# Patient Record
Sex: Male | Born: 1960
Health system: Southern US, Community
[De-identification: ages and names within clinical notes are randomized; demographics above are authoritative.]

## PROBLEM LIST (undated history)

## (undated) DIAGNOSIS — E119 Type 2 diabetes mellitus without complications: Secondary | ICD-10-CM

## (undated) DIAGNOSIS — G629 Polyneuropathy, unspecified: Secondary | ICD-10-CM

## (undated) DIAGNOSIS — I10 Essential (primary) hypertension: Secondary | ICD-10-CM

## (undated) DIAGNOSIS — F1020 Alcohol dependence, uncomplicated: Secondary | ICD-10-CM

## (undated) DIAGNOSIS — K861 Other chronic pancreatitis: Secondary | ICD-10-CM

## (undated) DIAGNOSIS — K862 Cyst of pancreas: Secondary | ICD-10-CM

## (undated) DIAGNOSIS — E785 Hyperlipidemia, unspecified: Secondary | ICD-10-CM

## (undated) DIAGNOSIS — C801 Malignant (primary) neoplasm, unspecified: Secondary | ICD-10-CM

## (undated) HISTORY — DX: Essential (primary) hypertension: I10

## (undated) HISTORY — PX: OTHER SURGICAL HISTORY: SHX169

## (undated) HISTORY — DX: Hyperlipidemia, unspecified: E78.5

## (undated) HISTORY — DX: Malignant (primary) neoplasm, unspecified: C80.1

## (undated) HISTORY — DX: Type 2 diabetes mellitus without complications: E11.9

---

## 2004-11-01 ENCOUNTER — Ambulatory Visit: Payer: Self-pay | Admitting: Internal Medicine

## 2005-03-01 ENCOUNTER — Ambulatory Visit: Payer: Self-pay | Admitting: Internal Medicine

## 2005-08-31 ENCOUNTER — Ambulatory Visit: Payer: Self-pay | Admitting: Internal Medicine

## 2006-02-28 ENCOUNTER — Ambulatory Visit: Payer: Self-pay | Admitting: Internal Medicine

## 2006-08-27 ENCOUNTER — Ambulatory Visit: Payer: Self-pay | Admitting: Internal Medicine

## 2007-02-25 ENCOUNTER — Ambulatory Visit: Payer: Self-pay | Admitting: Internal Medicine

## 2007-02-25 LAB — CONVERTED CEMR LAB: Hgb A1c MFr Bld: 6.9 % — ABNORMAL HIGH (ref 4.6–6.0)

## 2007-08-19 DIAGNOSIS — I10 Essential (primary) hypertension: Secondary | ICD-10-CM | POA: Insufficient documentation

## 2007-08-20 ENCOUNTER — Ambulatory Visit: Payer: Self-pay | Admitting: Internal Medicine

## 2007-08-20 DIAGNOSIS — E785 Hyperlipidemia, unspecified: Secondary | ICD-10-CM | POA: Insufficient documentation

## 2007-08-20 DIAGNOSIS — E119 Type 2 diabetes mellitus without complications: Secondary | ICD-10-CM | POA: Insufficient documentation

## 2007-12-18 ENCOUNTER — Ambulatory Visit: Payer: Self-pay | Admitting: Internal Medicine

## 2007-12-18 LAB — CONVERTED CEMR LAB: Cholesterol, target level: 200 mg/dL

## 2007-12-19 ENCOUNTER — Telehealth: Payer: Self-pay | Admitting: Internal Medicine

## 2011-12-05 DIAGNOSIS — C439 Malignant melanoma of skin, unspecified: Secondary | ICD-10-CM

## 2011-12-05 HISTORY — DX: Malignant melanoma of skin, unspecified: C43.9

## 2012-12-10 ENCOUNTER — Telehealth: Payer: Self-pay | Admitting: Internal Medicine

## 2012-12-10 NOTE — Telephone Encounter (Signed)
Sister, Kenneth [name] calling back.  Kenneth [name] is receiving a chemo tx at this time and his mom Kenneth [name] is with him.  He has worsening HTN and needs an appointment for same with Dr. Amador Cunas "either Thursday or Monday".   He has "not been seen in the office in years".  Please call Kenneth [name] to schedule this appointment.

## 2012-12-10 NOTE — Telephone Encounter (Signed)
RN attempted to call wife back, left a voice mail.

## 2012-12-11 NOTE — Telephone Encounter (Signed)
Scheduled the appt but not confirmed left message on vm

## 2012-12-11 NOTE — Telephone Encounter (Signed)
appt schedule

## 2012-12-12 ENCOUNTER — Encounter: Payer: Self-pay | Admitting: Internal Medicine

## 2012-12-12 ENCOUNTER — Ambulatory Visit (INDEPENDENT_AMBULATORY_CARE_PROVIDER_SITE_OTHER): Payer: Self-pay | Admitting: Internal Medicine

## 2012-12-12 VITALS — BP 130/90 | HR 74 | Temp 98.1°F | Resp 18 | Ht 63.75 in | Wt 171.0 lb

## 2012-12-12 DIAGNOSIS — C7800 Secondary malignant neoplasm of unspecified lung: Secondary | ICD-10-CM | POA: Insufficient documentation

## 2012-12-12 DIAGNOSIS — Z0000 Encounter for general adult medical examination without abnormal findings: Secondary | ICD-10-CM

## 2012-12-12 DIAGNOSIS — C439 Malignant melanoma of skin, unspecified: Secondary | ICD-10-CM

## 2012-12-12 DIAGNOSIS — I10 Essential (primary) hypertension: Secondary | ICD-10-CM

## 2012-12-12 MED ORDER — HYDROCHLOROTHIAZIDE 25 MG PO TABS: 25.0000 mg | ORAL_TABLET | Freq: Every day | ORAL | Status: DC

## 2012-12-12 MED ORDER — ATENOLOL 100 MG PO TABS: 100.0000 mg | ORAL_TABLET | Freq: Every day | ORAL | Status: DC

## 2012-12-12 NOTE — Progress Notes (Signed)
Subjective:    Patient ID: Kenneth [name], male    DOB: 30-Mar-1961, 52 y.o.   MRN: [id number]  HPI  52 year old patient who is seen today to reestablish with our practice and for a preventive health examination. He was diagnosed with metastatic melanoma to lungs and brain in September of last year. He is followed closely at [facility] and has completed the chemotherapy. He is scheduled for a followup PET scan and will be considered for additional radiotherapy in the future. He presented with a large lesion involving his right anterior thigh that had been present for at least 2 years.  Diagnosis was delayed due to the lack of health insurance.  No past medical history on file.  History   Social History  . Marital Status: Single    Spouse Name: N/A    Number of Children: N/A  . Years of Education: N/A   Occupational History  . Not on file.   Social History Main Topics  . Smoking status: Never Smoker   . Smokeless tobacco: Never Used  . Alcohol Use: No  . Drug Use: No  . Sexually Active: Not on file   Other Topics Concern  . Not on file   Social History Narrative  . No narrative on file    No past surgical history on file.  No family history on file.  No Known Allergies  Current Outpatient Prescriptions on File Prior to Visit  Medication Sig Dispense Refill  . atenolol (TENORMIN) 50 MG tablet Take 50 mg by mouth daily.      . hydrochlorothiazide (HYDRODIURIL) 25 MG tablet Take 25 mg by mouth daily.        BP 130/90  Pulse 74  Temp 98.1 F (36.7 C) (Oral)  Resp 18  Ht 5' 3.75" (1.619 m)  Wt 171 lb (77.565 kg)  BMI 29.58 kg/m2  SpO2 97%   Father recently died due to complications of congestive heart failure    Review of Systems  Constitutional: Negative for fever, chills, activity change, appetite change and fatigue.  HENT: Negative for hearing loss, ear pain, congestion, rhinorrhea, sneezing, mouth sores, trouble swallowing, neck pain, neck stiffness,  dental problem, voice change, sinus pressure and tinnitus.   Eyes: Negative for photophobia, pain, redness and visual disturbance.  Respiratory: Negative for apnea, cough, choking, chest tightness, shortness of breath and wheezing.   Cardiovascular: Negative for chest pain, palpitations and leg swelling.  Gastrointestinal: Negative for nausea, vomiting, abdominal pain, diarrhea, constipation, blood in stool, abdominal distention, anal bleeding and rectal pain.  Genitourinary: Negative for dysuria, urgency, frequency, hematuria, flank pain, decreased urine volume, discharge, penile swelling, scrotal swelling, difficulty urinating, genital sores and testicular pain.  Musculoskeletal: Negative for myalgias, back pain, joint swelling, arthralgias and gait problem.  Skin: Positive for wound. Negative for color change and rash.  Neurological: Negative for dizziness, tremors, seizures, syncope, facial asymmetry, speech difficulty, weakness, light-headedness, numbness and headaches.  Hematological: Negative for adenopathy. Does not bruise/bleed easily.  Psychiatric/Behavioral: Negative for suicidal ideas, hallucinations, behavioral problems, confusion, sleep disturbance, self-injury, dysphoric mood, decreased concentration and agitation. The patient is not nervous/anxious.        Objective:   Physical Exam  Constitutional: He appears well-developed and well-nourished.       Blood pressure 150/90  HENT:  Head: Normocephalic and atraumatic.  Right Ear: External ear normal.  Left Ear: External ear normal.  Nose: Nose normal.  Mouth/Throat: Oropharynx is clear and moist.  Eyes: Conjunctivae normal and  EOM are normal. Pupils are equal, round, and reactive to light. No scleral icterus.  Neck: Normal range of motion. Neck supple. No JVD present. No thyromegaly present.  Cardiovascular: Regular rhythm, normal heart sounds and intact distal pulses.  Exam reveals no gallop and no friction rub.   No murmur  heard. Pulmonary/Chest: Effort normal and breath sounds normal. He exhibits no tenderness.  Abdominal: Soft. Bowel sounds are normal. He exhibits no distension and no mass. There is no tenderness.  Genitourinary: Prostate normal and penis normal. Guaiac negative stool.  Musculoskeletal: Normal range of motion. He exhibits no edema and no tenderness.  Lymphadenopathy:    He has no cervical adenopathy.  Neurological: He is alert. He has normal reflexes. No cranial nerve deficit. Coordination normal.  Skin: Skin is warm and dry. No rash noted.       A large fungating lesion at least 12 cm in greatest length involving his right anterior thigh  Psychiatric: He has a normal mood and affect. His behavior is normal.          Assessment & Plan:   Metastatic melanoma to lung  and brain Hypertension suboptimal control. We'll increase atenolol to 100 mg daily. Home blood pressure monitoring encouraged

## 2012-12-12 NOTE — Patient Instructions (Signed)
Limit your sodium (Salt) intake  Please check your blood pressure on a regular basis.  If it is consistently greater than 150/90, please make an office appointment.    It is important that you exercise regularly, at least 20 minutes 3 to 4 times per week.  If you develop chest pain or shortness of breath seek  medical attention.  Return in 6 months for follow-up  

## 2013-01-30 ENCOUNTER — Telehealth: Payer: Self-pay | Admitting: Internal Medicine

## 2013-01-30 NOTE — Telephone Encounter (Signed)
Patient requesting a call back from Dr. Kirtland Bouchard to discuss cancer treatment he is undergoing at [facility]. They [facility] physicians have asked him to call Dr. Kirtland Bouchard and f/u on the latest.

## 2013-02-03 ENCOUNTER — Ambulatory Visit: Payer: Self-pay | Admitting: Internal Medicine

## 2013-02-04 ENCOUNTER — Encounter: Payer: Self-pay | Admitting: Internal Medicine

## 2013-02-04 ENCOUNTER — Ambulatory Visit (INDEPENDENT_AMBULATORY_CARE_PROVIDER_SITE_OTHER): Payer: Self-pay | Admitting: Internal Medicine

## 2013-02-04 VITALS — BP 140/96 | HR 76 | Temp 98.3°F | Resp 18 | Wt 174.0 lb

## 2013-02-04 DIAGNOSIS — C7800 Secondary malignant neoplasm of unspecified lung: Secondary | ICD-10-CM

## 2013-02-04 DIAGNOSIS — C439 Malignant melanoma of skin, unspecified: Secondary | ICD-10-CM

## 2013-02-04 DIAGNOSIS — I10 Essential (primary) hypertension: Secondary | ICD-10-CM

## 2013-02-04 DIAGNOSIS — E119 Type 2 diabetes mellitus without complications: Secondary | ICD-10-CM

## 2013-02-04 MED ORDER — LISINOPRIL-HYDROCHLOROTHIAZIDE 20-25 MG PO TABS: 1.0000 | ORAL_TABLET | Freq: Every day | ORAL | Status: DC

## 2013-02-04 MED ORDER — METFORMIN HCL 1000 MG PO TABS: 1000.0000 mg | ORAL_TABLET | Freq: Two times a day (BID) | ORAL | Status: DC

## 2013-02-04 MED ORDER — GLIMEPIRIDE 2 MG PO TABS: 1.0000 mg | ORAL_TABLET | Freq: Every day | ORAL | Status: DC

## 2013-02-04 NOTE — Progress Notes (Signed)
Subjective:    Patient ID: Kenneth [name], male    DOB: 31-Mar-1961, 52 y.o.   MRN: [id number]    HPI  52 year old patient who has recently reestablished with our practice.  He is followed at [facility] for her metastatic melanoma to the brain and lungs. He's had a recent PET scan that apparently revealed resolution of the metastatic lesions. He is being considered for local excision of the primary melanoma involving his right leg. He has a history of hypertension and diabetes but has not lost to medical followup;  he has been on metformin in the past. He presently is on atenolol 100 mg daily and hydrochlorothiazide 25 mg for blood pressure control. He feels well today. He states blood sugars have been greater than 200 at [facility]. They have performed a recent hemoglobin A1c. Records have been requested  History reviewed. No pertinent past medical history.  History   Social History  . Marital Status: Single    Spouse Name: N/A    Number of Children: N/A  . Years of Education: N/A   Occupational History  . Not on file.   Social History Main Topics  . Smoking status: Never Smoker   . Smokeless tobacco: Never Used  . Alcohol Use: No  . Drug Use: No  . Sexually Active: Not on file   Other Topics Concern  . Not on file   Social History Narrative  . No narrative on file    History reviewed. No pertinent past surgical history.  No family history on file.  No Known Allergies  Current Outpatient Prescriptions on File Prior to Visit  Medication Sig Dispense Refill  . atenolol (TENORMIN) 100 MG tablet Take 1 tablet (100 mg total) by mouth daily.  90 tablet  3  . MULTIPLE VITAMIN PO Take 1 tablet by mouth daily.       No current facility-administered medications on file prior to visit.    BP 140/96  Pulse 76  Temp(Src) 98.3 F (36.8 C) (Oral)  Resp 18  Wt 174 lb (78.926 kg)  BMI 30.11 kg/m2  SpO2 97%       Review of Systems  Constitutional: Negative for fever,  chills, appetite change and fatigue.  HENT: Negative for hearing loss, ear pain, congestion, sore throat, trouble swallowing, neck stiffness, dental problem, voice change and tinnitus.   Eyes: Negative for pain, discharge and visual disturbance.  Respiratory: Negative for cough, chest tightness, wheezing and stridor.   Cardiovascular: Negative for chest pain, palpitations and leg swelling.  Gastrointestinal: Negative for nausea, vomiting, abdominal pain, diarrhea, constipation, blood in stool and abdominal distention.  Genitourinary: Negative for urgency, hematuria, flank pain, discharge, difficulty urinating and genital sores.  Musculoskeletal: Negative for myalgias, back pain, joint swelling, arthralgias and gait problem.  Skin: Positive for wound. Negative for rash.  Neurological: Negative for dizziness, syncope, speech difficulty, weakness, numbness and headaches.  Hematological: Negative for adenopathy. Does not bruise/bleed easily.  Psychiatric/Behavioral: Negative for behavioral problems and dysphoric mood. The patient is not nervous/anxious.        Objective:   Physical Exam  Constitutional: He is oriented to person, place, and time. He appears well-developed.  Blood pressure 150/92 Weight 174  HENT:  Head: Normocephalic.  Right Ear: External ear normal.  Left Ear: External ear normal.  Eyes: Conjunctivae and EOM are normal.  Neck: Normal range of motion.  Cardiovascular: Normal rate and normal heart sounds.   Pulmonary/Chest: Breath sounds normal.  Abdominal: Bowel sounds are  normal.  Musculoskeletal: Normal range of motion. He exhibits no edema and no tenderness.  Neurological: He is alert and oriented to person, place, and time.  Psychiatric: He has a normal mood and affect. His behavior is normal.          Assessment & Plan:   Hypertension. We'll add lisinopril 20 mg daily to his regimen. Diet discussed weight loss encouraged as well as exercise regimen. Will  recheck 4 weeks;  DASH diet information dispensed Diabetes mellitus. Will resume metformin therapy and titrate to a full dose of 2 g daily in divided dosages. Will also be placed on Amaryl 1 mg daily. Laboratory studies from [facility] will be reviewed

## 2013-02-04 NOTE — Patient Instructions (Signed)
Limit your sodium (Salt) intake    It is important that you exercise regularly, at least 20 minutes 3 to 4 times per week.  If you develop chest pain or shortness of breath seek  medical attention.  You need to lose weight.  Consider a lower calorie diet and regular exercise.DASH Diet The DASH diet stands for "Dietary Approaches to Stop Hypertension." It is a healthy eating plan that has been shown to reduce high blood pressure (hypertension) in as little as 14 days, while also possibly providing other significant health benefits. These other health benefits include reducing the risk of breast cancer after menopause and reducing the risk of type 2 diabetes, heart disease, colon cancer, and stroke. Health benefits also include weight loss and slowing kidney failure in patients with chronic kidney disease.  DIET GUIDELINES  Limit salt (sodium). Your diet should contain less than 1500 mg of sodium daily.  Limit refined or processed carbohydrates. Your diet should include mostly whole grains. Desserts and added sugars should be used sparingly.  Include small amounts of heart-healthy fats. These types of fats include nuts, oils, and tub margarine. Limit saturated and trans fats. These fats have been shown to be harmful in the body. CHOOSING FOODS  The following food groups are based on a 2000 calorie diet. See your Registered Dietitian for individual calorie needs. Grains and Grain Products (6 to 8 servings daily)  Eat More Often: Whole-wheat bread, brown rice, whole-grain or wheat pasta, quinoa, popcorn without added fat or salt (air popped).  Eat Less Often: White bread, white pasta, white rice, cornbread. Vegetables (4 to 5 servings daily)  Eat More Often: Fresh, frozen, and canned vegetables. Vegetables may be raw, steamed, roasted, or grilled with a minimal amount of fat.  Eat Less Often/Avoid: Creamed or fried vegetables. Vegetables in a cheese sauce. Fruit (4 to 5 servings daily)  Eat  More Often: All fresh, canned (in natural juice), or frozen fruits. Dried fruits without added sugar. One hundred percent fruit juice ( cup [237 mL] daily).  Eat Less Often: Dried fruits with added sugar. Canned fruit in light or heavy syrup. Lean Meats, Fish, and Poultry (2 servings or less daily. One serving is 3 to 4 oz [85-114 g]).  Eat More Often: Ninety percent or leaner ground beef, tenderloin, sirloin. Round cuts of beef, chicken breast, turkey breast. All fish. Grill, bake, or broil your meat. Nothing should be fried.  Eat Less Often/Avoid: Fatty cuts of meat, turkey, or chicken leg, thigh, or wing. Fried cuts of meat or fish. Dairy (2 to 3 servings)  Eat More Often: Low-fat or fat-free milk, low-fat plain or light yogurt, reduced-fat or part-skim cheese.  Eat Less Often/Avoid: Milk (whole, 2%).Whole milk yogurt. Full-fat cheeses. Nuts, Seeds, and Legumes (4 to 5 servings per week)  Eat More Often: All without added salt.  Eat Less Often/Avoid: Salted nuts and seeds, canned beans with added salt. Fats and Sweets (limited)  Eat More Often: Vegetable oils, tub margarines without trans fats, sugar-free gelatin. Mayonnaise and salad dressings.  Eat Less Often/Avoid: Coconut oils, palm oils, butter, stick margarine, cream, half and half, cookies, candy, pie. FOR MORE INFORMATION The Dash Diet Eating Plan: www.dashdiet.org Document Released: 11/09/2011 Document Revised: 02/12/2012 Document Reviewed: 11/09/2011 [employer] Patient Information 2013 [employer].  

## 2013-03-06 ENCOUNTER — Encounter: Payer: Self-pay | Admitting: Internal Medicine

## 2013-03-06 ENCOUNTER — Ambulatory Visit (INDEPENDENT_AMBULATORY_CARE_PROVIDER_SITE_OTHER): Payer: Self-pay | Admitting: Internal Medicine

## 2013-03-06 VITALS — BP 150/90 | HR 70 | Temp 97.6°F | Resp 18 | Wt 175.0 lb

## 2013-03-06 DIAGNOSIS — E119 Type 2 diabetes mellitus without complications: Secondary | ICD-10-CM

## 2013-03-06 DIAGNOSIS — E785 Hyperlipidemia, unspecified: Secondary | ICD-10-CM

## 2013-03-06 DIAGNOSIS — I10 Essential (primary) hypertension: Secondary | ICD-10-CM

## 2013-03-06 NOTE — Progress Notes (Signed)
Subjective:    Patient ID: Kenneth [name], male    DOB: 1961-07-27, 52 y.o.   MRN: [id number]  HPI 52 year old patient who is hypertension and type 2 diabetes. He is followed closely at [facility] do to metastatic melanoma. Since his last visit here he has had local resection of the large primary melanoma involving his right anterior thigh region. This has healed nicely. In general doing quite well with essentially normal blood sugars. He's had no difficulties with the his blood pressure. He is back to work part-time  History reviewed. No pertinent past medical history.  History   Social History  . Marital Status: Single    Spouse Name: N/A    Number of Children: N/A  . Years of Education: N/A   Occupational History  . Not on file.   Social History Main Topics  . Smoking status: Never Smoker   . Smokeless tobacco: Never Used  . Alcohol Use: No  . Drug Use: No  . Sexually Active: Not on file   Other Topics Concern  . Not on file   Social History Narrative  . No narrative on file    History reviewed. No pertinent past surgical history.  No family history on file.  No Known Allergies  Current Outpatient Prescriptions on File Prior to Visit  Medication Sig Dispense Refill  . atenolol (TENORMIN) 100 MG tablet Take 1 tablet (100 mg total) by mouth daily.  90 tablet  3  . glimepiride (AMARYL) 2 MG tablet Take 0.5 tablets (1 mg total) by mouth daily before breakfast.  30 tablet  3  . lisinopril-hydrochlorothiazide (PRINZIDE,ZESTORETIC) 20-25 MG per tablet Take 1 tablet by mouth daily.  90 tablet  3  . metFORMIN (GLUCOPHAGE) 1000 MG tablet Take 1 tablet (1,000 mg total) by mouth 2 (two) times daily with a meal.  180 tablet  3  . MULTIPLE VITAMIN PO Take 1 tablet by mouth daily.       No current facility-administered medications on file prior to visit.    BP 150/90  Pulse 70  Temp(Src) 97.6 F (36.4 C) (Oral)  Resp 18  Wt 175 lb (79.379 kg)  BMI 30.28 kg/m2  SpO2  98%         Review of Systems  Constitutional: Negative for fever, chills, appetite change and fatigue.  HENT: Negative for hearing loss, ear pain, congestion, sore throat, trouble swallowing, neck stiffness, dental problem, voice change and tinnitus.   Eyes: Negative for pain, discharge and visual disturbance.  Respiratory: Negative for cough, chest tightness, wheezing and stridor.   Cardiovascular: Negative for chest pain, palpitations and leg swelling.  Gastrointestinal: Negative for nausea, vomiting, abdominal pain, diarrhea, constipation, blood in stool and abdominal distention.  Genitourinary: Negative for urgency, hematuria, flank pain, discharge, difficulty urinating and genital sores.  Musculoskeletal: Negative for myalgias, back pain, joint swelling, arthralgias and gait problem.  Skin: Positive for wound. Negative for rash.  Neurological: Negative for dizziness, syncope, speech difficulty, weakness, numbness and headaches.  Hematological: Negative for adenopathy. Does not bruise/bleed easily.  Psychiatric/Behavioral: Negative for behavioral problems and dysphoric mood. The patient is not nervous/anxious.        Objective:   Physical Exam  Constitutional: He is oriented to person, place, and time. He appears well-developed.  140/88  HENT:  Head: Normocephalic.  Right Ear: External ear normal.  Left Ear: External ear normal.  Eyes: Conjunctivae and EOM are normal.  Neck: Normal range of motion.  Cardiovascular: Normal rate and  normal heart sounds.   Pulmonary/Chest: Breath sounds normal.  Abdominal: Bowel sounds are normal.  Musculoskeletal: Normal range of motion. He exhibits no edema and no tenderness.  Neurological: He is alert and oriented to person, place, and time.  Skin:  Large wound with skin grafting nicely healing involving his left anterior thigh area approximately 8 x 12 cm  Psychiatric: He has a normal mood and affect. His behavior is normal.           Assessment & Plan:  Diabetes mellitus. Has maintain  nice glycemic control- we'll check hemoglobin A1c at next visit. Hopefully we'll have insurance available at that time Hypertension. Blood pressure is well-controlled during his encounters at [facility] blood pressure is high normal today. We'll continue present regimen lifestyle issues discussed  Recheck 3 months with hemoglobin A1c

## 2013-03-06 NOTE — Patient Instructions (Signed)

## 2013-06-13 ENCOUNTER — Ambulatory Visit: Payer: Self-pay | Admitting: Internal Medicine

## 2013-06-19 ENCOUNTER — Ambulatory Visit (INDEPENDENT_AMBULATORY_CARE_PROVIDER_SITE_OTHER): Payer: Medicaid Other | Admitting: Internal Medicine

## 2013-06-19 ENCOUNTER — Encounter: Payer: Self-pay | Admitting: Internal Medicine

## 2013-06-19 VITALS — BP 150/100 | HR 70 | Temp 98.5°F | Resp 20 | Wt 185.0 lb

## 2013-06-19 DIAGNOSIS — C439 Malignant melanoma of skin, unspecified: Secondary | ICD-10-CM

## 2013-06-19 DIAGNOSIS — I10 Essential (primary) hypertension: Secondary | ICD-10-CM

## 2013-06-19 DIAGNOSIS — C7800 Secondary malignant neoplasm of unspecified lung: Secondary | ICD-10-CM

## 2013-06-19 DIAGNOSIS — E785 Hyperlipidemia, unspecified: Secondary | ICD-10-CM

## 2013-06-19 DIAGNOSIS — E119 Type 2 diabetes mellitus without complications: Secondary | ICD-10-CM

## 2013-06-19 LAB — LIPID PANEL
HDL: 44 mg/dL (ref >39.00)
Triglycerides: 128 mg/dL (ref 0.0–149.0)
VLDL: 25.6 mg/dL (ref 0.0–40.0)

## 2013-06-19 LAB — MICROALBUMIN / CREATININE URINE RATIO
Creatinine,U: 100.2 mg/dL
Microalb Creat Ratio: 2.2 mg/g (ref 0.0–30.0)

## 2013-06-19 MED ORDER — AMLODIPINE BESYLATE 5 MG PO TABS: 5.0000 mg | ORAL_TABLET | Freq: Every day | ORAL | Status: DC

## 2013-06-19 NOTE — Progress Notes (Signed)
Subjective:    Patient ID: Kenneth [name], male    DOB: [birth year], 52 y.o.   MRN: [id number]  HPI  52 year old patient who is seen today for followup. He has a history of type 2 diabetes controlled on oral agents. No recent hemoglobin A 1C. He has recently obtained insurance has not covered by Medicaid. He is followed closely at [facility] do to metastatic melanoma to the brain and lung. He is now followed on a biannual basis and apparently there is no evidence of metastatic disease He is treated hypertension and states he has been quite compliant with his medications. Blood pressure is quite elevated today He has history of dyslipidemia but no recent lipid profile  History reviewed. No pertinent past medical history.  History   Social History  . Marital Status: Single    Spouse Name: N/A    Number of Children: N/A  . Years of Education: N/A   Occupational History  . Not on file.   Social History Main Topics  . Smoking status: Never Smoker   . Smokeless tobacco: Never Used  . Alcohol Use: No  . Drug Use: No  . Sexually Active: Not on file   Other Topics Concern  . Not on file   Social History Narrative  . No narrative on file    History reviewed. No pertinent past surgical history.  No family history on file.  No Known Allergies  Current Outpatient Prescriptions on File Prior to Visit  Medication Sig Dispense Refill  . atenolol (TENORMIN) 100 MG tablet Take 1 tablet (100 mg total) by mouth daily.  90 tablet  3  . glimepiride (AMARYL) 2 MG tablet Take 0.5 tablets (1 mg total) by mouth daily before breakfast.  30 tablet  3  . lisinopril-hydrochlorothiazide (PRINZIDE,ZESTORETIC) 20-25 MG per tablet Take 1 tablet by mouth daily.  90 tablet  3  . metFORMIN (GLUCOPHAGE) 1000 MG tablet Take 1 tablet (1,000 mg total) by mouth 2 (two) times daily with a meal.  180 tablet  3  . MULTIPLE VITAMIN PO Take 1 tablet by mouth daily.       No current facility-administered  medications on file prior to visit.    BP 150/100  Pulse 70  Temp(Src) 98.5 F (36.9 C) (Oral)  Resp 20  Wt 185 lb (83.915 kg)  BMI 32.01 kg/m2  SpO2 96%       Review of Systems  Constitutional: Negative for fever, chills, appetite change and fatigue.  HENT: Negative for hearing loss, ear pain, congestion, sore throat, trouble swallowing, neck stiffness, dental problem, voice change and tinnitus.   Eyes: Negative for pain, discharge and visual disturbance.  Respiratory: Negative for cough, chest tightness, wheezing and stridor.   Cardiovascular: Negative for chest pain, palpitations and leg swelling.  Gastrointestinal: Negative for nausea, vomiting, abdominal pain, diarrhea, constipation, blood in stool and abdominal distention.  Genitourinary: Negative for urgency, hematuria, flank pain, discharge, difficulty urinating and genital sores.  Musculoskeletal: Negative for myalgias, back pain, joint swelling, arthralgias and gait problem.  Skin: Negative for rash.  Neurological: Negative for dizziness, syncope, speech difficulty, weakness, numbness and headaches.  Hematological: Negative for adenopathy. Does not bruise/bleed easily.  Psychiatric/Behavioral: Negative for behavioral problems and dysphoric mood. The patient is not nervous/anxious.        Objective:   Physical Exam  Constitutional: He is oriented to person, place, and time. He appears well-developed.  Blood pressure 200/100  HENT:  Head: Normocephalic.  Right Ear: External  ear normal.  Left Ear: External ear normal.  Eyes: Conjunctivae and EOM are normal.  Neck: Normal range of motion.  Cardiovascular: Normal rate and normal heart sounds.   Pulmonary/Chest: Breath sounds normal.  Abdominal: Bowel sounds are normal.  Musculoskeletal: Normal range of motion. He exhibits no edema and no tenderness.  Neurological: He is alert and oriented to person, place, and time.  Skin:  Healing scar right anterior thigh   Psychiatric: He has a normal mood and affect. His behavior is normal.          Assessment & Plan:    Diabetes mellitus. Will check a hemoglobin A1c as well as a urine for microalbumin. Annual eye exam encouraged Hypertension poor control. We'll amlodipine 5 mg daily low-salt diet weight loss encouraged. We'll recheck in 4 weeks Dyslipidemia will check a lipid profile Metastatic melanoma. Stable at this time. Followup [facility]

## 2013-06-19 NOTE — Patient Instructions (Signed)
Limit your sodium (Salt) intake    It is important that you exercise regularly, at least 20 minutes 3 to 4 times per week.  If you develop chest pain or shortness of breath seek  medical attention.   Please check your hemoglobin A1c every 3 months   

## 2013-07-17 ENCOUNTER — Encounter: Payer: Self-pay | Admitting: Internal Medicine

## 2013-07-17 ENCOUNTER — Ambulatory Visit (INDEPENDENT_AMBULATORY_CARE_PROVIDER_SITE_OTHER): Payer: Medicaid Other | Admitting: Internal Medicine

## 2013-07-17 VITALS — BP 140/90 | HR 72 | Temp 100.3°F | Wt 186.0 lb

## 2013-07-17 DIAGNOSIS — E119 Type 2 diabetes mellitus without complications: Secondary | ICD-10-CM

## 2013-07-17 DIAGNOSIS — I10 Essential (primary) hypertension: Secondary | ICD-10-CM

## 2013-07-17 DIAGNOSIS — C439 Malignant melanoma of skin, unspecified: Secondary | ICD-10-CM

## 2013-07-17 DIAGNOSIS — C7800 Secondary malignant neoplasm of unspecified lung: Secondary | ICD-10-CM

## 2013-07-17 NOTE — Progress Notes (Signed)
Subjective:    Patient ID: Kenneth [name], male    DOB: 24-Sep-1961, 52 y.o.   MRN: [id number]  HPI  52 year old patient who is seen today for followup of hypertension. He was seen one month ago with poorly controlled high blood pressure. He is now on 4 drugs for a blood pressure control. He feels well and has tolerated the regimen without difficulties. He has type 2 diabetes and hemoglobin A1c 7.0. He feels well and remains active. He states that he walks 9 holes of golf at least once weekly.  He is followed at [facility] for metastatic melanoma and now is in remission. He is seen at their facility only twice annually  History reviewed. No pertinent past medical history.  History   Social History  . Marital Status: Single    Spouse Name: N/A    Number of Children: N/A  . Years of Education: N/A   Occupational History  . Not on file.   Social History Main Topics  . Smoking status: Never Smoker   . Smokeless tobacco: Never Used  . Alcohol Use: No  . Drug Use: No  . Sexual Activity: Not on file   Other Topics Concern  . Not on file   Social History Narrative  . No narrative on file    History reviewed. No pertinent past surgical history.  No family history on file.  No Known Allergies  Current Outpatient Prescriptions on File Prior to Visit  Medication Sig Dispense Refill  . amLODipine (NORVASC) 5 MG tablet Take 1 tablet (5 mg total) by mouth daily.  90 tablet  3  . atenolol (TENORMIN) 100 MG tablet Take 1 tablet (100 mg total) by mouth daily.  90 tablet  3  . glimepiride (AMARYL) 2 MG tablet Take 0.5 tablets (1 mg total) by mouth daily before breakfast.  30 tablet  3  . lisinopril-hydrochlorothiazide (PRINZIDE,ZESTORETIC) 20-25 MG per tablet Take 1 tablet by mouth daily.  90 tablet  3  . metFORMIN (GLUCOPHAGE) 1000 MG tablet Take 1 tablet (1,000 mg total) by mouth 2 (two) times daily with a meal.  180 tablet  3  . MULTIPLE VITAMIN PO Take 1 tablet by mouth daily.        No current facility-administered medications on file prior to visit.    BP 140/90  Pulse 72  Temp(Src) 100.3 F (37.9 C) (Oral)  Wt 186 lb (84.369 kg)  BMI 32.19 kg/m2  SpO2 98%       Review of Systems  Constitutional: Negative for fever, chills, appetite change and fatigue.  HENT: Negative for hearing loss, ear pain, congestion, sore throat, trouble swallowing, neck stiffness, dental problem, voice change and tinnitus.   Eyes: Negative for pain, discharge and visual disturbance.  Respiratory: Negative for cough, chest tightness, wheezing and stridor.   Cardiovascular: Negative for chest pain, palpitations and leg swelling.  Gastrointestinal: Negative for nausea, vomiting, abdominal pain, diarrhea, constipation, blood in stool and abdominal distention.  Genitourinary: Negative for urgency, hematuria, flank pain, discharge, difficulty urinating and genital sores.  Musculoskeletal: Negative for myalgias, back pain, joint swelling, arthralgias and gait problem.  Skin: Negative for rash.  Neurological: Negative for dizziness, syncope, speech difficulty, weakness, numbness and headaches.  Hematological: Negative for adenopathy. Does not bruise/bleed easily.  Psychiatric/Behavioral: Negative for behavioral problems and dysphoric mood. The patient is not nervous/anxious.        Objective:   Physical Exam  Constitutional: He appears well-developed and well-nourished. No distress.  Temperature  100.3 Blood pressure 140/86          Assessment & Plan:   Hypertension. Reasonable control. We'll continue present regimen weight loss more exercise all encouraged. Home blood pressure monitoring encouraged Type 2 diabetes. No change in therapy  Recheck 3 months

## 2013-07-17 NOTE — Patient Instructions (Signed)
Limit your sodium (Salt) intake  Please check your blood pressure on a regular basis.  If it is consistently greater than 150/90, please make an office appointment.     It is important that you exercise regularly, at least 20 minutes 3 to 4 times per week.  If you develop chest pain or shortness of breath seek  medical attention. 

## 2013-10-02 ENCOUNTER — Other Ambulatory Visit: Payer: Self-pay | Admitting: Internal Medicine

## 2013-10-16 ENCOUNTER — Ambulatory Visit (INDEPENDENT_AMBULATORY_CARE_PROVIDER_SITE_OTHER): Payer: Medicaid Other | Admitting: Internal Medicine

## 2013-10-16 ENCOUNTER — Encounter: Payer: Self-pay | Admitting: Internal Medicine

## 2013-10-16 VITALS — BP 140/86 | HR 76 | Temp 98.1°F | Resp 20 | Wt 186.0 lb

## 2013-10-16 DIAGNOSIS — Z23 Encounter for immunization: Secondary | ICD-10-CM

## 2013-10-16 DIAGNOSIS — E785 Hyperlipidemia, unspecified: Secondary | ICD-10-CM

## 2013-10-16 DIAGNOSIS — E119 Type 2 diabetes mellitus without complications: Secondary | ICD-10-CM

## 2013-10-16 DIAGNOSIS — I10 Essential (primary) hypertension: Secondary | ICD-10-CM

## 2013-10-16 MED ORDER — METFORMIN HCL 1000 MG PO TABS: 1000.0000 mg | ORAL_TABLET | Freq: Two times a day (BID) | ORAL | Status: DC

## 2013-10-16 MED ORDER — ATENOLOL 100 MG PO TABS: 100.0000 mg | ORAL_TABLET | Freq: Every day | ORAL | Status: DC

## 2013-10-16 MED ORDER — AMLODIPINE BESYLATE 5 MG PO TABS: 5.0000 mg | ORAL_TABLET | Freq: Every day | ORAL | Status: DC

## 2013-10-16 MED ORDER — GLIMEPIRIDE 2 MG PO TABS: ORAL_TABLET | ORAL | Status: DC

## 2013-10-16 MED ORDER — ATORVASTATIN CALCIUM 20 MG PO TABS: 20.0000 mg | ORAL_TABLET | Freq: Every day | ORAL | Status: DC

## 2013-10-16 MED ORDER — LISINOPRIL-HYDROCHLOROTHIAZIDE 20-25 MG PO TABS: 1.0000 | ORAL_TABLET | Freq: Every day | ORAL | Status: DC

## 2013-10-16 NOTE — Progress Notes (Signed)
Subjective:    Patient ID: Kenneth [name], male    DOB: May 26, 1961, 52 y.o.   MRN: [id number]  HPI  52 year old patient who is in today for followup. He has type 2 diabetes. Last hemoglobin A1c 7.0. He did have an eye examination in the spring. He has a history of metastatic melanoma in approximately 2 months ago underwent surgery for a suspected brain metastatic lesion. This fortunately turned out to be benign. The patient has treated hypertension which has been stable  The benefits of statin therapy discussed  History reviewed. No pertinent past medical history.  History   Social History  . Marital Status: Single    Spouse Name: N/A    Number of Children: N/A  . Years of Education: N/A   Occupational History  . Not on file.   Social History Main Topics  . Smoking status: Never Smoker   . Smokeless tobacco: Never Used  . Alcohol Use: No  . Drug Use: No  . Sexual Activity: Not on file   Other Topics Concern  . Not on file   Social History Narrative  . No narrative on file    History reviewed. No pertinent past surgical history.  No family history on file.  No Known Allergies  Current Outpatient Prescriptions on File Prior to Visit  Medication Sig Dispense Refill  . amLODipine (NORVASC) 5 MG tablet Take 1 tablet (5 mg total) by mouth daily.  90 tablet  3  . atenolol (TENORMIN) 100 MG tablet Take 1 tablet (100 mg total) by mouth daily.  90 tablet  3  . glimepiride (AMARYL) 2 MG tablet TAKE 1/2 TABLET BY MOUTH EVERY DAY BEFORE BREAKFAST  30 tablet  5  . lisinopril-hydrochlorothiazide (PRINZIDE,ZESTORETIC) 20-25 MG per tablet Take 1 tablet by mouth daily.  90 tablet  3  . metFORMIN (GLUCOPHAGE) 1000 MG tablet Take 1 tablet (1,000 mg total) by mouth 2 (two) times daily with a meal.  180 tablet  3  . MULTIPLE VITAMIN PO Take 1 tablet by mouth daily.       No current facility-administered medications on file prior to visit.    BP 140/86  Pulse 76  Temp(Src) 98.1 F  (36.7 C) (Oral)  Resp 20  Wt 186 lb (84.369 kg)  SpO2 98%       Review of Systems  Constitutional: Negative for fever, chills, appetite change and fatigue.  HENT: Negative for congestion, dental problem, ear pain, hearing loss, sore throat, tinnitus, trouble swallowing and voice change.   Eyes: Negative for pain, discharge and visual disturbance.  Respiratory: Negative for cough, chest tightness, wheezing and stridor.   Cardiovascular: Negative for chest pain, palpitations and leg swelling.  Gastrointestinal: Negative for nausea, vomiting, abdominal pain, diarrhea, constipation, blood in stool and abdominal distention.  Genitourinary: Negative for urgency, hematuria, flank pain, discharge, difficulty urinating and genital sores.  Musculoskeletal: Negative for arthralgias, back pain, gait problem, joint swelling, myalgias and neck stiffness.  Skin: Negative for rash.  Neurological: Negative for dizziness, syncope, speech difficulty, weakness, numbness and headaches.  Hematological: Negative for adenopathy. Does not bruise/bleed easily.  Psychiatric/Behavioral: Negative for behavioral problems and dysphoric mood. The patient is not nervous/anxious.        Objective:   Physical Exam  Constitutional: He is oriented to person, place, and time. He appears well-developed.  HENT:  Head: Normocephalic.  Right Ear: External ear normal.  Left Ear: External ear normal.  Eyes: Conjunctivae and EOM are normal.  Neck: Normal  range of motion.  Cardiovascular: Normal rate and normal heart sounds.   Pulmonary/Chest: Breath sounds normal.  Abdominal: Bowel sounds are normal.  Musculoskeletal: Normal range of motion. He exhibits no edema and no tenderness.  Neurological: He is alert and oriented to person, place, and time.  Psychiatric: He has a normal mood and affect. His behavior is normal.          Assessment & Plan:   Diabetes mellitus. Weight loss exercise all encouraged. We'll  check a hemoglobin A1c Hypertension stable Dyslipidemia. Will place on atorvastatin 20  Recheck 3 months

## 2013-10-16 NOTE — Patient Instructions (Signed)
Please check your hemoglobin A1c every 3 months  You need to lose weight.  Consider a lower calorie diet and regular exercise.  Limit your sodium (Salt) intake    It is important that you exercise regularly, at least 20 minutes 3 to 4 times per week.  If you develop chest pain or shortness of breath seek  medical attention.

## 2013-12-04 HISTORY — PX: BRAIN SURGERY: SHX531

## 2014-01-15 ENCOUNTER — Ambulatory Visit (INDEPENDENT_AMBULATORY_CARE_PROVIDER_SITE_OTHER): Payer: Self-pay | Admitting: Internal Medicine

## 2014-01-15 ENCOUNTER — Encounter: Payer: Self-pay | Admitting: Internal Medicine

## 2014-01-15 VITALS — BP 146/90 | HR 70 | Temp 98.3°F | Resp 20 | Ht 63.75 in | Wt 183.0 lb

## 2014-01-15 DIAGNOSIS — E785 Hyperlipidemia, unspecified: Secondary | ICD-10-CM

## 2014-01-15 DIAGNOSIS — C439 Malignant melanoma of skin, unspecified: Secondary | ICD-10-CM

## 2014-01-15 DIAGNOSIS — E119 Type 2 diabetes mellitus without complications: Secondary | ICD-10-CM

## 2014-01-15 DIAGNOSIS — C7800 Secondary malignant neoplasm of unspecified lung: Secondary | ICD-10-CM

## 2014-01-15 DIAGNOSIS — I10 Essential (primary) hypertension: Secondary | ICD-10-CM

## 2014-01-15 LAB — HEMOGLOBIN A1C: Hgb A1c MFr Bld: 6.7 % — ABNORMAL HIGH (ref 4.6–6.5)

## 2014-01-15 NOTE — Progress Notes (Signed)
Subjective:    Patient ID: Kenneth [name], male    DOB: 1961/03/13, 53 y.o.   MRN: [id number]  HPI  53 year old patient who is seen today for followup. He has type 2 diabetes with sensory well controlled. Hemoglobin A1c apparently was not drawn 3 months ago. He has treated hypertension and dyslipidemia. Remains on statin therapy but she continues to tolerate well. He is due for a followup eye examination in about 2 months. He has a history of metastatic melanoma and is scheduled for followup at [facility] next month. This will include MRI and PET scanning as well as screening lab. He feels well  History reviewed. No pertinent past medical history.  History   Social History  . Marital Status: Single    Spouse Name: N/A    Number of Children: N/A  . Years of Education: N/A   Occupational History  . Not on file.   Social History Main Topics  . Smoking status: Never Smoker   . Smokeless tobacco: Never Used  . Alcohol Use: No  . Drug Use: No  . Sexual Activity: Not on file   Other Topics Concern  . Not on file   Social History Narrative  . No narrative on file    History reviewed. No pertinent past surgical history.  No family history on file.  No Known Allergies  Current Outpatient Prescriptions on File Prior to Visit  Medication Sig Dispense Refill  . amLODipine (NORVASC) 5 MG tablet Take 1 tablet (5 mg total) by mouth daily.  90 tablet  3  . atenolol (TENORMIN) 100 MG tablet Take 1 tablet (100 mg total) by mouth daily.  90 tablet  3  . atorvastatin (LIPITOR) 20 MG tablet Take 1 tablet (20 mg total) by mouth daily.  90 tablet  3  . glimepiride (AMARYL) 2 MG tablet TAKE 1/2 TABLET BY MOUTH EVERY DAY BEFORE BREAKFAST  90 tablet  5  . lisinopril-hydrochlorothiazide (PRINZIDE,ZESTORETIC) 20-25 MG per tablet Take 1 tablet by mouth daily.  90 tablet  3  . metFORMIN (GLUCOPHAGE) 1000 MG tablet Take 1 tablet (1,000 mg total) by mouth 2 (two) times daily with a meal.  180  tablet  3  . MULTIPLE VITAMIN PO Take 1 tablet by mouth daily.       No current facility-administered medications on file prior to visit.    BP 146/90  Pulse 70  Temp(Src) 98.3 F (36.8 C) (Oral)  Resp 20  Ht 5' 3.75" (1.619 m)  Wt 183 lb (83.008 kg)  BMI 31.67 kg/m2  SpO2 98%       Review of Systems  Constitutional: Negative for fever, chills, appetite change and fatigue.  HENT: Negative for congestion, dental problem, ear pain, hearing loss, sore throat, tinnitus, trouble swallowing and voice change.   Eyes: Negative for pain, discharge and visual disturbance.  Respiratory: Negative for cough, chest tightness, wheezing and stridor.   Cardiovascular: Negative for chest pain, palpitations and leg swelling.  Gastrointestinal: Negative for nausea, vomiting, abdominal pain, diarrhea, constipation, blood in stool and abdominal distention.  Genitourinary: Negative for urgency, hematuria, flank pain, discharge, difficulty urinating and genital sores.  Musculoskeletal: Negative for arthralgias, back pain, gait problem, joint swelling, myalgias and neck stiffness.  Skin: Negative for rash.  Neurological: Negative for dizziness, syncope, speech difficulty, weakness, numbness and headaches.  Hematological: Negative for adenopathy. Does not bruise/bleed easily.  Psychiatric/Behavioral: Negative for behavioral problems and dysphoric mood. The patient is not nervous/anxious.  Objective:   Physical Exam  Constitutional: He is oriented to person, place, and time. He appears well-developed.  HENT:  Head: Normocephalic.  Right Ear: External ear normal.  Left Ear: External ear normal.  Eyes: Conjunctivae and EOM are normal.  Neck: Normal range of motion.  Cardiovascular: Normal rate and normal heart sounds.   Pulmonary/Chest: Breath sounds normal.  Abdominal: Bowel sounds are normal.  Musculoskeletal: Normal range of motion. He exhibits no edema and no tenderness.  Neurological:  He is alert and oriented to person, place, and time.  Psychiatric: He has a normal mood and affect. His behavior is normal.          Assessment & Plan:   Diabetes mellitus. We'll check a hemoglobin A1c exercise weight loss all encouraged Hypertension. Repeat blood pressure 135/82. We'll continue present regimen low-salt diet weight loss encouraged Dyslipidemia continue statin therapy Metastatic melanoma. Follow [facility] next month   Recheck here 3 months

## 2014-01-15 NOTE — Progress Notes (Signed)
Pre-visit discussion using our clinic review tool. No additional management support is needed unless otherwise documented below in the visit note.  

## 2014-01-15 NOTE — Patient Instructions (Signed)
Limit your sodium (Salt) intake    It is important that you exercise regularly, at least 20 minutes 3 to 4 times per week.  If you develop chest pain or shortness of breath seek  medical attention.  You need to lose weight.  Consider a lower calorie diet and regular exercise.   Please check your hemoglobin A1c every 3 months   

## 2014-01-16 ENCOUNTER — Telehealth: Payer: Self-pay

## 2014-01-16 ENCOUNTER — Telehealth: Payer: Self-pay | Admitting: Internal Medicine

## 2014-01-16 NOTE — Telephone Encounter (Signed)
Relevant patient education assigned to patient using Emmi. ° °

## 2014-04-16 ENCOUNTER — Ambulatory Visit (INDEPENDENT_AMBULATORY_CARE_PROVIDER_SITE_OTHER): Payer: Self-pay | Admitting: Internal Medicine

## 2014-04-16 ENCOUNTER — Encounter: Payer: Self-pay | Admitting: Internal Medicine

## 2014-04-16 VITALS — BP 150/90 | HR 77 | Temp 98.6°F | Resp 20 | Ht 63.75 in | Wt 192.0 lb

## 2014-04-16 DIAGNOSIS — C7800 Secondary malignant neoplasm of unspecified lung: Secondary | ICD-10-CM

## 2014-04-16 DIAGNOSIS — I10 Essential (primary) hypertension: Secondary | ICD-10-CM

## 2014-04-16 DIAGNOSIS — C439 Malignant melanoma of skin, unspecified: Secondary | ICD-10-CM

## 2014-04-16 DIAGNOSIS — E785 Hyperlipidemia, unspecified: Secondary | ICD-10-CM

## 2014-04-16 DIAGNOSIS — E119 Type 2 diabetes mellitus without complications: Secondary | ICD-10-CM

## 2014-04-16 LAB — HEMOGLOBIN A1C: Hgb A1c MFr Bld: 6.3 % (ref 4.6–6.5)

## 2014-04-16 MED ORDER — LISINOPRIL-HYDROCHLOROTHIAZIDE 20-12.5 MG PO TABS: 2.0000 | ORAL_TABLET | Freq: Every day | ORAL | Status: DC

## 2014-04-16 NOTE — Progress Notes (Signed)
   Subjective:    Patient ID: Kenneth [name], male    DOB: 1961/10/18, 53 y.o.   MRN: [id number]  HPI  BP Readings from Last 3 Encounters:  04/16/14 150/90  01/15/14 146/90  10/16/13 140/86    Review of Systems     Objective:   Physical Exam        Assessment & Plan:

## 2014-04-16 NOTE — Progress Notes (Signed)
Subjective:    Patient ID: Kenneth [name], male    DOB: 12/27/1960, 53 y.o.   MRN: [id number]  HPI  54 year old patient who is followed at [facility] for metastatic melanoma, which now appears to be in remission.  He is doing quite well.  He has history of obesity, type 2 diabetes, hypertension, and dyslipidemia.  In general, doing quite well.  It has been one year since his last eye examination. Blood pressure readings have been borderline high.  He is on triple therapy and states that he has been quite compliant. He remains on atorvastatin and continues to do well.  No cardiopulmonary complaints. His last hemoglobin A1c was well controlled  BP Readings from Last 3 Encounters:  04/16/14 150/90  01/15/14 146/90  10/16/13 140/86   Lab Results  Component Value Date   HGBA1C 6.7* 01/15/2014    History reviewed. No pertinent past medical history.  History   Social History  . Marital Status: Single    Spouse Name: N/A    Number of Children: N/A  . Years of Education: N/A   Occupational History  . Not on file.   Social History Main Topics  . Smoking status: Never Smoker   . Smokeless tobacco: Never Used  . Alcohol Use: No  . Drug Use: No  . Sexual Activity: Not on file   Other Topics Concern  . Not on file   Social History Narrative  . No narrative on file    History reviewed. No pertinent past surgical history.  No family history on file.  No Known Allergies  Current Outpatient Prescriptions on File Prior to Visit  Medication Sig Dispense Refill  . amLODipine (NORVASC) 5 MG tablet Take 1 tablet (5 mg total) by mouth daily.  90 tablet  3  . atenolol (TENORMIN) 100 MG tablet Take 1 tablet (100 mg total) by mouth daily.  90 tablet  3  . atorvastatin (LIPITOR) 20 MG tablet Take 1 tablet (20 mg total) by mouth daily.  90 tablet  3  . glimepiride (AMARYL) 2 MG tablet TAKE 1/2 TABLET BY MOUTH EVERY DAY BEFORE BREAKFAST  90 tablet  5  . lisinopril-hydrochlorothiazide  (PRINZIDE,ZESTORETIC) 20-25 MG per tablet Take 1 tablet by mouth daily.  90 tablet  3  . metFORMIN (GLUCOPHAGE) 1000 MG tablet Take 1 tablet (1,000 mg total) by mouth 2 (two) times daily with a meal.  180 tablet  3  . MULTIPLE VITAMIN PO Take 1 tablet by mouth daily.       No current facility-administered medications on file prior to visit.    BP 150/90  Pulse 77  Temp(Src) 98.6 F (37 C) (Oral)  Resp 20  Ht 5' 3.75" (1.619 m)  Wt 192 lb (87.091 kg)  BMI 33.23 kg/m2  SpO2 97%       Review of Systems  Constitutional: Negative for fever, chills, appetite change and fatigue.  HENT: Negative for congestion, dental problem, ear pain, hearing loss, sore throat, tinnitus, trouble swallowing and voice change.   Eyes: Negative for pain, discharge and visual disturbance.  Respiratory: Negative for cough, chest tightness, wheezing and stridor.   Cardiovascular: Negative for chest pain, palpitations and leg swelling.  Gastrointestinal: Negative for nausea, vomiting, abdominal pain, diarrhea, constipation, blood in stool and abdominal distention.  Genitourinary: Negative for urgency, hematuria, flank pain, discharge, difficulty urinating and genital sores.  Musculoskeletal: Negative for arthralgias, back pain, gait problem, joint swelling, myalgias and neck stiffness.  Skin: Negative for  rash.  Neurological: Negative for dizziness, syncope, speech difficulty, weakness, numbness and headaches.  Hematological: Negative for adenopathy. Does not bruise/bleed easily.  Psychiatric/Behavioral: Negative for behavioral problems and dysphoric mood. The patient is not nervous/anxious.        Objective:   Physical Exam  Constitutional: He is oriented to person, place, and time. He appears well-developed.  HENT:  Head: Normocephalic.  Right Ear: External ear normal.  Left Ear: External ear normal.  Eyes: Conjunctivae and EOM are normal.  Neck: Normal range of motion.  Cardiovascular: Normal rate  and normal heart sounds.   Pulmonary/Chest: Breath sounds normal.  Abdominal: Bowel sounds are normal.  Musculoskeletal: Normal range of motion. He exhibits no edema and no tenderness.  Neurological: He is alert and oriented to person, place, and time.  Skin:  Well healed area of scarring from skin graft right anterior thigh  Psychiatric: He has a normal mood and affect. His behavior is normal.          Assessment & Plan:     Diabetes mellitus.  We'll check a hemoglobin A1c diet, exercise, encouraged. Hypertension, suboptimal control.  We'll increase lisinopril to 40 mg daily History of melanoma.  Followup, [facility] Obesity weight loss encouraged  Recheck 3 months  Home blood pressure monitor and encouraged

## 2014-04-16 NOTE — Progress Notes (Signed)
Pre-visit discussion using our clinic review tool. No additional management support is needed unless otherwise documented below in the visit note.  

## 2014-04-16 NOTE — Patient Instructions (Addendum)
Please check your hemoglobin A1c every 3 months  Limit your sodium (Salt) intake  You need to lose weight.  Consider a lower calorie diet and regular exercise.    It is important that you exercise regularly, at least 20 minutes 3 to 4 times per week.  If you develop chest pain or shortness of breath seek  medical attention.  Please see your eye doctor yearly to check for diabetic eye damage   

## 2014-07-20 ENCOUNTER — Ambulatory Visit (INDEPENDENT_AMBULATORY_CARE_PROVIDER_SITE_OTHER): Payer: Self-pay | Admitting: Internal Medicine

## 2014-07-20 ENCOUNTER — Encounter: Payer: Self-pay | Admitting: Internal Medicine

## 2014-07-20 VITALS — BP 130/82 | HR 71 | Temp 97.8°F | Resp 20 | Ht 64.0 in | Wt 199.0 lb

## 2014-07-20 DIAGNOSIS — E119 Type 2 diabetes mellitus without complications: Secondary | ICD-10-CM

## 2014-07-20 DIAGNOSIS — E785 Hyperlipidemia, unspecified: Secondary | ICD-10-CM

## 2014-07-20 DIAGNOSIS — I10 Essential (primary) hypertension: Secondary | ICD-10-CM

## 2014-07-20 DIAGNOSIS — C439 Malignant melanoma of skin, unspecified: Secondary | ICD-10-CM

## 2014-07-20 DIAGNOSIS — C7800 Secondary malignant neoplasm of unspecified lung: Secondary | ICD-10-CM

## 2014-07-20 DIAGNOSIS — Z0000 Encounter for general adult medical examination without abnormal findings: Secondary | ICD-10-CM

## 2014-07-20 NOTE — Progress Notes (Signed)
Subjective:    Patient ID: Kenneth [name], male    DOB: 08/27/1961, 53 y.o.   MRN: [id number]  HPI  53 year old patient who is seen today for a preventive health examination.  Medical problems include type 2 diabetes.  His last hemoglobin A1c 6 point 2.  He has hypertension and dyslipidemia. He is followed at [facility] biannually due to metastatic melanoma.  Evaluation includes labs brain MRI and total body PET scanning.  He feels well.  He has been in remission for some time  His Medicaid insurance has lapsed.  Presently, no insurance  History reviewed. No pertinent past medical history.  History   Social History  . Marital Status: Single    Spouse Name: N/A    Number of Children: N/A  . Years of Education: N/A   Occupational History  . Not on file.   Social History Main Topics  . Smoking status: Never Smoker   . Smokeless tobacco: Never Used  . Alcohol Use: No  . Drug Use: No  . Sexual Activity: Not on file   Other Topics Concern  . Not on file   Social History Narrative  . No narrative on file    History reviewed. No pertinent past surgical history.  No family history on file.  No Known Allergies  Current Outpatient Prescriptions on File Prior to Visit  Medication Sig Dispense Refill  . amLODipine (NORVASC) 5 MG tablet Take 1 tablet (5 mg total) by mouth daily.  90 tablet  3  . atenolol (TENORMIN) 100 MG tablet Take 1 tablet (100 mg total) by mouth daily.  90 tablet  3  . atorvastatin (LIPITOR) 20 MG tablet Take 1 tablet (20 mg total) by mouth daily.  90 tablet  3  . glimepiride (AMARYL) 2 MG tablet TAKE 1/2 TABLET BY MOUTH EVERY DAY BEFORE BREAKFAST  90 tablet  5  . lisinopril-hydrochlorothiazide (PRINZIDE,ZESTORETIC) 20-12.5 MG per tablet Take 2 tablets by mouth daily.  180 tablet  4  . metFORMIN (GLUCOPHAGE) 1000 MG tablet Take 1 tablet (1,000 mg total) by mouth 2 (two) times daily with a meal.  180 tablet  3  . MULTIPLE VITAMIN PO Take 1 tablet by mouth  daily.       No current facility-administered medications on file prior to visit.    BP 130/82  Pulse 71  Temp(Src) 97.8 F (36.6 C) (Oral)  Resp 20  Ht 5\' 4"  (1.626 m)  Wt 199 lb (90.266 kg)  BMI 34.14 kg/m2  SpO2 97%     Review of Systems  Constitutional: Negative for fever, chills, appetite change and fatigue.  HENT: Negative for congestion, dental problem, ear pain, hearing loss, sore throat, tinnitus, trouble swallowing and voice change.   Eyes: Negative for pain, discharge and visual disturbance.  Respiratory: Negative for cough, chest tightness, wheezing and stridor.   Cardiovascular: Negative for chest pain, palpitations and leg swelling.  Gastrointestinal: Negative for nausea, vomiting, abdominal pain, diarrhea, constipation, blood in stool and abdominal distention.  Genitourinary: Negative for urgency, hematuria, flank pain, discharge, difficulty urinating and genital sores.  Musculoskeletal: Negative for arthralgias, back pain, gait problem, joint swelling, myalgias and neck stiffness.  Skin: Negative for rash.  Neurological: Negative for dizziness, syncope, speech difficulty, weakness, numbness and headaches.  Hematological: Negative for adenopathy. Does not bruise/bleed easily.  Psychiatric/Behavioral: Negative for behavioral problems and dysphoric mood. The patient is not nervous/anxious.        Objective:   Physical Exam  Constitutional:  He appears well-developed and well-nourished.  Repeat blood pressure 130/80 Obese  HENT:  Head: Normocephalic and atraumatic.  Right Ear: External ear normal.  Left Ear: External ear normal.  Nose: Nose normal.  Mouth/Throat: Oropharynx is clear and moist.  Pharyngeal crowding  Eyes: Conjunctivae and EOM are normal. Pupils are equal, round, and reactive to light. No scleral icterus.  Neck: Normal range of motion. Neck supple. No JVD present. No thyromegaly present.  Cardiovascular: Regular rhythm, normal heart sounds and  intact distal pulses.  Exam reveals no gallop and no friction rub.   No murmur heard. Pulmonary/Chest: Effort normal and breath sounds normal. He exhibits no tenderness.  Abdominal: Soft. Bowel sounds are normal. He exhibits no distension and no mass. There is no tenderness.  Genitourinary: Prostate normal and penis normal.  Musculoskeletal: Normal range of motion. He exhibits no edema and no tenderness.  Lymphadenopathy:    He has no cervical adenopathy.  Neurological: He is alert. He has normal reflexes. No cranial nerve deficit. Coordination normal.  Skin: Skin is warm and dry. No rash noted.  Surgical scarring, right anterior thigh  Psychiatric: He has a normal mood and affect. His behavior is normal.          Assessment & Plan:  Preventive health examination  Needs annual eye examination and screening colonoscopy  Diabetes mellitus. Dyslipidemia Hypertension controlled OSA suspect.  Weight loss encouraged.  We'll consider a sleep study when patient has health insurance  Recheck 6 months

## 2014-07-20 NOTE — Patient Instructions (Signed)
Schedule your colonoscopy to help detect colon cancer.  Please see your eye doctor yearly to check for diabetic eye damage  Consider sleep study to rule out obstructive sleep apnea  Health Maintenance A healthy lifestyle and preventative care can promote health and wellness.  Maintain regular health, dental, and eye exams.  Eat a healthy diet. Foods like vegetables, fruits, whole grains, low-fat dairy products, and lean protein foods contain the nutrients you need and are low in calories. Decrease your intake of foods high in solid fats, added sugars, and salt. Get information about a proper diet from your health care provider, if necessary.  Regular physical exercise is one of the most important things you can do for your health. Most adults should get at least 150 minutes of moderate-intensity exercise (any activity that increases your heart rate and causes you to sweat) each week. In addition, most adults need muscle-strengthening exercises on 2 or more days a week.   Maintain a healthy weight. The body mass index (BMI) is a screening tool to identify possible weight problems. It provides an estimate of body fat based on height and weight. Your health care provider can find your BMI and can help you achieve or maintain a healthy weight. For males 20 years and older:  A BMI below 18.5 is considered underweight.  A BMI of 18.5 to 24.9 is normal.  A BMI of 25 to 29.9 is considered overweight.  A BMI of 30 and above is considered obese.  Maintain normal blood lipids and cholesterol by exercising and minimizing your intake of saturated fat. Eat a balanced diet with plenty of fruits and vegetables. Blood tests for lipids and cholesterol should begin at age 63 and be repeated every 5 years. If your lipid or cholesterol levels are high, you are over age 28, or you are at high risk for heart disease, you may need your cholesterol levels checked more frequently.Ongoing high lipid and cholesterol  levels should be treated with medicines if diet and exercise are not working.  If you smoke, find out from your health care provider how to quit. If you do not use tobacco, do not start.  Lung cancer screening is recommended for adults aged 27-80 years who are at high risk for developing lung cancer because of a history of smoking. A yearly low-dose CT scan of the lungs is recommended for people who have at least a 30-pack-year history of smoking and are current smokers or have quit within the past 15 years. A pack year of smoking is smoking an average of 1 pack of cigarettes a day for 1 year (for example, a 30-pack-year history of smoking could mean smoking 1 pack a day for 30 years or 2 packs a day for 15 years). Yearly screening should continue until the smoker has stopped smoking for at least 15 years. Yearly screening should be stopped for people who develop a health problem that would prevent them from having lung cancer treatment.  If you choose to drink alcohol, do not have more than 2 drinks per day. One drink is considered to be 12 oz (360 mL) of beer, 5 oz (150 mL) of wine, or 1.5 oz (45 mL) of liquor.  Avoid the use of street drugs. Do not share needles with anyone. Ask for help if you need support or instructions about stopping the use of drugs.  High blood pressure causes heart disease and increases the risk of stroke. Blood pressure should be checked at least every  1-2 years. Ongoing high blood pressure should be treated with medicines if weight loss and exercise are not effective.  If you are 53-40 years old, ask your health care provider if you should take aspirin to prevent heart disease.  Diabetes screening involves taking a blood sample to check your fasting blood sugar level. This should be done once every 3 years after age 37 if you are at a normal weight and without risk factors for diabetes. Testing should be considered at a younger age or be carried out more frequently if you  are overweight and have at least 1 risk factor for diabetes.  Colorectal cancer can be detected and often prevented. Most routine colorectal cancer screening begins at the age of 10 and continues through age 52. However, your health care provider may recommend screening at an earlier age if you have risk factors for colon cancer. On a yearly basis, your health care provider may provide home test kits to check for hidden blood in the stool. A small camera at the end of a tube may be used to directly examine the colon (sigmoidoscopy or colonoscopy) to detect the earliest forms of colorectal cancer. Talk to your health care provider about this at age 72 when routine screening begins. A direct exam of the colon should be repeated every 5-10 years through age 59, unless early forms of precancerous polyps or small growths are found.  People who are at an increased risk for hepatitis B should be screened for this virus. You are considered at high risk for hepatitis B if:  You were born in a country where hepatitis B occurs often. Talk with your health care provider about which countries are considered high risk.  Your parents were born in a high-risk country and you have not received a shot to protect against hepatitis B (hepatitis B vaccine).  You have HIV or AIDS.  You use needles to inject street drugs.  You live with, or have sex with, someone who has hepatitis B.  You are a man who has sex with other men (MSM).  You get hemodialysis treatment.  You take certain medicines for conditions like cancer, organ transplantation, and autoimmune conditions.  Hepatitis C blood testing is recommended for all people born from [birth year] through 1965 and any individual with known risk factors for hepatitis C.  Healthy men should no longer receive prostate-specific antigen (PSA) blood tests as part of routine cancer screening. Talk to your health care provider about prostate cancer screening.  Testicular cancer  screening is not recommended for adolescents or adult males who have no symptoms. Screening includes self-exam, a health care provider exam, and other screening tests. Consult with your health care provider about any symptoms you have or any concerns you have about testicular cancer.  Practice safe sex. Use condoms and avoid high-risk sexual practices to reduce the spread of sexually transmitted infections (STIs).  You should be screened for STIs, including gonorrhea and chlamydia if:  You are sexually active and are younger than 24 years.  You are older than 24 years, and your health care provider tells you that you are at risk for this type of infection.  Your sexual activity has changed since you were last screened, and you are at an increased risk for chlamydia or gonorrhea. Ask your health care provider if you are at risk.  If you are at risk of being infected with HIV, it is recommended that you take a prescription medicine daily to prevent HIV  infection. This is called pre-exposure prophylaxis (PrEP). You are considered at risk if:  You are a man who has sex with other men (MSM).  You are a heterosexual man who is sexually active with multiple partners.  You take drugs by injection.  You are sexually active with a partner who has HIV.  Talk with your health care provider about whether you are at high risk of being infected with HIV. If you choose to begin PrEP, you should first be tested for HIV. You should then be tested every 3 months for as long as you are taking PrEP.  Use sunscreen. Apply sunscreen liberally and repeatedly throughout the day. You should seek shade when your shadow is shorter than you. Protect yourself by wearing long sleeves, pants, a wide-brimmed hat, and sunglasses year round whenever you are outdoors.  Tell your health care provider of new moles or changes in moles, especially if there is a change in shape or color. Also, tell your health care provider if a  mole is larger than the size of a pencil eraser.  A one-time screening for abdominal aortic aneurysm (AAA) and surgical repair of large AAAs by ultrasound is recommended for men aged 54-75 years who are current or former smokers.  Stay current with your vaccines (immunizations). Document Released: 05/18/2008 Document Revised: 11/25/2013 Document Reviewed: 04/17/2011 [facility] Patient Information 2015 [place], [place]. This information is not intended to replace advice given to you by your health care provider. Make sure you discuss any questions you have with your health care provider. Cardiac Diet This diet can help prevent heart disease and stroke. Many factors influence your heart health, including eating and exercise habits. Coronary risk rises a lot with abnormal blood fat (lipid) levels. Cardiac meal planning includes limiting unhealthy fats, increasing healthy fats, and making other small dietary changes. General guidelines are as follows:  Adjust calorie intake to reach and maintain desirable body weight.  Limit total fat intake to less than 30% of total calories. Saturated fat should be less than 7% of calories.  Saturated fats are found in animal products and in some vegetable products. Saturated vegetable fats are found in coconut oil, cocoa butter, palm oil, and palm kernel oil. Read labels carefully to avoid these products as much as possible. Use butter in moderation. Choose tub margarines and oils that have 2 grams of fat or less. Good cooking oils are canola and olive oils.  Practice low-fat cooking techniques. Do not fry food. Instead, broil, bake, boil, steam, grill, roast on a rack, stir-fry, or microwave it. Other fat reducing suggestions include:  Remove the skin from poultry.  Remove all visible fat from meats.  Skim the fat off stews, soups, and gravies before serving them.  Steam vegetables in water or broth instead of sauting them in fat.  Avoid foods with trans fat  (or hydrogenated oils), such as commercially fried foods and commercially baked goods. Commercial shortening and deep-frying fats will contain trans fat.  Increase intake of fruits, vegetables, whole grains, and legumes to replace foods high in fat.  Increase consumption of nuts, legumes, and seeds to at least 4 servings weekly. One serving of a legume equals  cup, and 1 serving of nuts or seeds equals  cup.  Choose whole grains more often. Have 3 servings per day (a serving is 1 ounce [oz]).  Eat 4 to 5 servings of vegetables per day. A serving of vegetables is 1 cup of raw leafy vegetables;  cup of  raw or cooked cut-up vegetables;  cup of vegetable juice.  Eat 4 to 5 servings of fruit per day. A serving of fruit is 1 medium whole fruit;  cup of dried fruit;  cup of fresh, frozen, or canned fruit;  cup of 100% fruit juice.  Increase your intake of dietary fiber to 20 to 30 grams per day. Insoluble fiber may help lower your risk of heart disease and may help curb your appetite.  Soluble fiber binds cholesterol to be removed from the blood. Foods high in soluble fiber are dried beans, citrus fruits, oats, apples, bananas, broccoli, Brussels sprouts, and eggplant.  Try to include foods fortified with plant sterols or stanols, such as yogurt, breads, juices, or margarines. Choose several fortified foods to achieve a daily intake of 2 to 3 grams of plant sterols or stanols.  Foods with omega-3 fats can help reduce your risk of heart disease. Aim to have a 3.5 oz portion of fatty fish twice per week, such as salmon, mackerel, albacore tuna, sardines, lake trout, or herring. If you wish to take a fish oil supplement, choose one that contains 1 gram of both DHA and EPA.  Limit processed meats to 2 servings (3 oz portion) weekly.  Limit the sodium in your diet to 1500 milligrams (mg) per day. If you have high blood pressure, talk to a registered dietitian about a DASH (Dietary Approaches to Stop  Hypertension) eating plan.  Limit sweets and beverages with added sugar, such as soda, to no more than 5 servings per week. One serving is:   1 tablespoon sugar.  1 tablespoon jelly or jam.   cup sorbet.  1 cup lemonade.   cup regular soda. CHOOSING FOODS Starches  Allowed: Breads: All kinds (wheat, rye, raisin, white, oatmeal, [place], [place], and English muffin bread). Low-fat rolls: English muffins, frankfurter and hamburger buns, bagels, pita bread, tortillas (not fried). Pancakes, waffles, biscuits, and muffins made with recommended oil.  Avoid: Products made with saturated or trans fats, oils, or whole milk products. Butter rolls, cheese breads, croissants. Commercial doughnuts, muffins, sweet rolls, biscuits, waffles, pancakes, store-bought mixes. Crackers  Allowed: Low-fat crackers and snacks: Animal, graham, rye, saltine (with recommended oil, no lard), oyster, and matzo crackers. Bread sticks, melba toast, rusks, flatbread, pretzels, and light popcorn.  Avoid: High-fat crackers: cheese crackers, butter crackers, and those made with coconut, palm oil, or trans fat (hydrogenated oils). Buttered popcorn. Cereals  Allowed: Hot or cold whole-grain cereals.  Avoid: Cereals containing coconut, hydrogenated vegetable fat, or animal fat. Potatoes / Pasta / Rice  Allowed: All kinds of potatoes, rice, and pasta (such as macaroni, spaghetti, and noodles).  Avoid: Pasta or rice prepared with cream sauce or high-fat cheese. Chow mein noodles, [place] fries. Vegetables  Allowed: All vegetables and vegetable juices.  Avoid: Fried vegetables. Vegetables in cream, butter, or high-fat cheese sauces. Limit coconut. Fruit in cream or custard. Protein  Allowed: Limit your intake of meat, seafood, and poultry to no more than 6 oz (cooked weight) per day. All lean, well-trimmed beef, veal, pork, and lamb. All chicken and [place] without skin. All fish and shellfish. Wild game: wild duck,  rabbit, pheasant, and venison. Egg whites or low-cholesterol egg substitutes may be used as desired. Meatless dishes: recipes with dried beans, peas, lentils, and tofu (soybean curd). Seeds and nuts: all seeds and most nuts.  Avoid: Prime grade and other heavily marbled and fatty meats, such as short ribs, spare ribs, rib eye roast or steak,  frankfurters, sausage, bacon, and high-fat luncheon meats, mutton. Caviar. Commercially fried fish. Domestic duck, goose, venison sausage. Organ meats: liver, gizzard, heart, chitterlings, brains, kidney, sweetbreads. Dairy  Allowed: Low-fat cheeses: nonfat or low-fat cottage cheese (1% or 2% fat), cheeses made with part skim milk, such as mozzarella, farmers, string, or ricotta. (Cheeses should be labeled no more than 2 to 6 grams fat per oz.). Skim (or 1%) milk: liquid, powdered, or evaporated. Buttermilk made with low-fat milk. Drinks made with skim or low-fat milk or cocoa. Chocolate milk or cocoa made with skim or low-fat (1%) milk. Nonfat or low-fat yogurt.  Avoid: Whole milk cheeses, including colby, cheddar, [place], Monte[place] Jack, [place], [place], [place], American, Swiss, and blue. Creamed cottage cheese, cream cheese. Whole milk and whole milk products, including buttermilk or yogurt made from whole milk, drinks made from whole milk. Condensed milk, evaporated whole milk, and 2% milk. Soups and Combination Foods  Allowed: Low-fat low-sodium soups: broth, dehydrated soups, homemade broth, soups with the fat removed, homemade cream soups made with skim or low-fat milk. Low-fat spaghetti, lasagna, chili, and Spanish rice if low-fat ingredients and low-fat cooking techniques are used.  Avoid: Cream soups made with whole milk, cream, or high-fat cheese. All other soups. Desserts and Sweets  Allowed: Sherbet, fruit ices, gelatins, meringues, and angel food cake. Homemade desserts with recommended fats, oils, and milk products. Jam, jelly, honey, marmalade,  sugars, and syrups. Pure sugar candy, such as gum drops, hard candy, jelly beans, marshmallows, mints, and small amounts of dark chocolate.  Avoid: Commercially prepared cakes, pies, cookies, frosting, pudding, or mixes for these products. Desserts containing whole milk products, chocolate, coconut, lard, palm oil, or palm kernel oil. Ice cream or ice cream drinks. Candy that contains chocolate, coconut, butter, hydrogenated fat, or unknown ingredients. Buttered syrups. Fats and Oils  Allowed: Vegetable oils: safflower, sunflower, corn, soybean, cottonseed, sesame, canola, olive, or peanut. Non-hydrogenated margarines. Salad dressing or mayonnaise: homemade or commercial, made with a recommended oil. Low or nonfat salad dressing or mayonnaise.  Limit added fats and oils to 6 to 8 tsp per day (includes fats used in cooking, baking, salads, and spreads on bread). Remember to count the "hidden fats" in foods.  Avoid: Solid fats and shortenings: butter, lard, salt pork, bacon drippings. Gravy containing meat fat, shortening, or suet. Cocoa butter, coconut. Coconut oil, palm oil, palm kernel oil, or hydrogenated oils: these ingredients are often used in bakery products, nondairy creamers, whipped toppings, candy, and commercially fried foods. Read labels carefully. Salad dressings made of unknown oils, sour cream, or cheese, such as blue cheese and Roquef[place]. Cream, all kinds: half-and-half, light, heavy, or whipping. Sour cream or cream cheese (even if "light" or low-fat). Nondairy cream substitutes: coffee creamers and sour cream substitutes made with palm, palm kernel, hydrogenated oils, or coconut oil. Beverages  Allowed: Coffee (regular or decaffeinated), tea. Diet carbonated beverages, mineral water. Alcohol: Check with your caregiver. Moderation is recommended.  Avoid: Whole milk, regular sodas, and juice drinks with added sugar. Condiments  Allowed: All seasonings and condiments. Cocoa powder.  "Cream" sauces made with recommended ingredients.  Avoid: Carob powder made with hydrogenated fats. SAMPLE MENU Breakfast   cup orange juice   cup oatmeal  1 slice toast  1 tsp margarine  1 cup skim milk Lunch  [place] sandwich with 2 oz [place], 2 slices bread  Lettuce and tomato slices  Fresh fruit  Carrot sticks  Coffee or tea Snack  Fresh fruit or low-fat crackers  Dinner  3 oz lean ground beef  1 baked potato  1 tsp margarine   cup asparagus  Lettuce salad  1 tbs non-creamy dressing   cup peach slices  1 cup skim milk Document Released: 08/29/2008 Document Revised: 05/21/2012 Document Reviewed: 01/20/2014 [employer] Patient Information 2015 [place], [place]. This information is not intended to replace advice given to you by your health care provider. Make sure you discuss any questions you have with your health care provider.

## 2014-07-20 NOTE — Progress Notes (Signed)
Pre visit review using our clinic review tool, if applicable. No additional management support is needed unless otherwise documented below in the visit note. 

## 2014-08-13 DIAGNOSIS — C439 Malignant melanoma of skin, unspecified: Secondary | ICD-10-CM | POA: Insufficient documentation

## 2014-10-15 ENCOUNTER — Other Ambulatory Visit: Payer: Self-pay | Admitting: Internal Medicine

## 2014-12-15 ENCOUNTER — Other Ambulatory Visit: Payer: Self-pay | Admitting: Internal Medicine

## 2014-12-29 ENCOUNTER — Other Ambulatory Visit: Payer: Self-pay | Admitting: Internal Medicine

## 2015-01-18 ENCOUNTER — Ambulatory Visit: Payer: Self-pay | Admitting: Internal Medicine

## 2015-01-21 ENCOUNTER — Ambulatory Visit (INDEPENDENT_AMBULATORY_CARE_PROVIDER_SITE_OTHER): Payer: Self-pay | Admitting: Internal Medicine

## 2015-01-21 ENCOUNTER — Encounter: Payer: Self-pay | Admitting: Internal Medicine

## 2015-01-21 VITALS — BP 124/80 | HR 71 | Temp 98.0°F | Resp 20 | Ht 64.0 in | Wt 196.0 lb

## 2015-01-21 DIAGNOSIS — I10 Essential (primary) hypertension: Secondary | ICD-10-CM

## 2015-01-21 DIAGNOSIS — E119 Type 2 diabetes mellitus without complications: Secondary | ICD-10-CM

## 2015-01-21 DIAGNOSIS — C7800 Secondary malignant neoplasm of unspecified lung: Secondary | ICD-10-CM

## 2015-01-21 DIAGNOSIS — C439 Malignant melanoma of skin, unspecified: Secondary | ICD-10-CM

## 2015-01-21 DIAGNOSIS — E785 Hyperlipidemia, unspecified: Secondary | ICD-10-CM

## 2015-01-21 NOTE — Progress Notes (Signed)
Subjective:    Patient ID: Kenneth [name], male    DOB: Oct 04, 1961, 54 y.o.   MRN: [id number]  HPI Lab Results  Component Value Date   HGBA1C 6.3 04/16/2014    Wt Readings from Last 3 Encounters:  01/21/15 196 lb (88.905 kg)  07/20/14 199 lb (90.266 kg)  04/16/14 192 lb (87.091 kg)    BP Readings from Last 3 Encounters:  01/21/15 124/80  07/20/14 130/82  04/16/14 5/16    54 year old patient who is followed at [facility] with a history of metastatic melanoma.  He is scheduled for follow-up next month and again in September.  There has been no evidence of metastatic disease at this time and if exam is negative in September will be considered in clinical remission and visits will be annual.  He feels well today  No health insurance.  Works part-time  He has hypertension, dyslipidemia and type 2 diabetes.  Last hemoglobin A1c was very well controlled.  He continues to track occasional blood sugars which have been normal.  No recent lab due to lack of insurance  No past medical history on file.  History   Social History  . Marital Status: Single    Spouse Name: N/A  . Number of Children: N/A  . Years of Education: N/A   Occupational History  . Not on file.   Social History Main Topics  . Smoking status: Never Smoker   . Smokeless tobacco: Never Used  . Alcohol Use: No  . Drug Use: No  . Sexual Activity: Not on file   Other Topics Concern  . Not on file   Social History Narrative    No past surgical history on file.  No family history on file.  No Known Allergies  Current Outpatient Prescriptions on File Prior to Visit  Medication Sig Dispense Refill  . amLODipine (NORVASC) 5 MG tablet TAKE 1 TABLET BY MOUTH EVERY DAY 90 tablet 1  . atenolol (TENORMIN) 100 MG tablet TAKE 1 TABLET BY MOUTH EVERY DAY 90 tablet 1  . atorvastatin (LIPITOR) 20 MG tablet TAKE 1 TABLET BY MOUTH EVERY DAY 90 tablet 1  . glimepiride (AMARYL) 2 MG tablet TAKE 1/2 TABLET BY MOUTH  EVERY DAY BEFORE BREAKFAST 90 tablet 5  . lisinopril-hydrochlorothiazide (PRINZIDE,ZESTORETIC) 20-12.5 MG per tablet Take 2 tablets by mouth daily. 180 tablet 4  . metFORMIN (GLUCOPHAGE) 1000 MG tablet TAKE 1 TABLET BY MOUTH TWICE A DAY WITH A MEAL 180 tablet 1  . MULTIPLE VITAMIN PO Take 1 tablet by mouth daily.    . naproxen sodium (ALEVE) 220 MG tablet Take 220 mg by mouth as needed.      No current facility-administered medications on file prior to visit.    BP 124/80 mmHg  Pulse 71  Temp(Src) 98 F (36.7 C) (Oral)  Resp 20  Ht 5\' 4"  (1.626 m)  Wt 196 lb (88.905 kg)  BMI 33.63 kg/m2  SpO2 97%      Review of Systems  Constitutional: Negative for fever, chills, appetite change and fatigue.  HENT: Negative for congestion, dental problem, ear pain, hearing loss, sore throat, tinnitus, trouble swallowing and voice change.   Eyes: Negative for pain, discharge and visual disturbance.  Respiratory: Negative for cough, chest tightness, wheezing and stridor.   Cardiovascular: Negative for chest pain, palpitations and leg swelling.  Gastrointestinal: Negative for nausea, vomiting, abdominal pain, diarrhea, constipation, blood in stool and abdominal distention.  Genitourinary: Negative for urgency, hematuria, flank pain, discharge,  difficulty urinating and genital sores.  Musculoskeletal: Negative for myalgias, back pain, joint swelling, arthralgias, gait problem and neck stiffness.  Skin: Negative for rash.  Neurological: Negative for dizziness, syncope, speech difficulty, weakness, numbness and headaches.  Hematological: Negative for adenopathy. Does not bruise/bleed easily.  Psychiatric/Behavioral: Negative for behavioral problems and dysphoric mood. The patient is not nervous/anxious.        Objective:   Physical Exam  Constitutional: He is oriented to person, place, and time. He appears well-developed.  HENT:  Head: Normocephalic.  Right Ear: External ear normal.  Left Ear:  External ear normal.  Eyes: Conjunctivae and EOM are normal.  Neck: Normal range of motion.  Cardiovascular: Normal rate and normal heart sounds.   Pulmonary/Chest: Breath sounds normal.  Abdominal: Bowel sounds are normal.  Musculoskeletal: Normal range of motion. He exhibits no edema or tenderness.  Neurological: He is alert and oriented to person, place, and time.  Psychiatric: He has a normal mood and affect. His behavior is normal.          Assessment & Plan:   Hypertension, well-controlled Dyslipidemia.  Continue statin therapy Diabetes mellitus.  Appears under good control.  Continue home blood sugar monitoring.  We'll defer hemoglobin A1c and other lab testing today due to lack of health benefits  Weight loss encouraged Home blood pressure monitoring.  Encouraged Recheck 6 months Follow-up [facility]

## 2015-01-21 NOTE — Progress Notes (Signed)
Pre visit review using our clinic review tool, if applicable. No additional management support is needed unless otherwise documented below in the visit note. 

## 2015-01-21 NOTE — Patient Instructions (Signed)
Limit your sodium (Salt) intake    It is important that you exercise regularly, at least 20 minutes 3 to 4 times per week.  If you develop chest pain or shortness of breath seek  medical attention.  You need to lose weight.  Consider a lower calorie diet and regular exercise.  Return in 6 months for follow-up   

## 2015-04-05 ENCOUNTER — Other Ambulatory Visit: Payer: Self-pay | Admitting: Internal Medicine

## 2015-04-17 ENCOUNTER — Other Ambulatory Visit: Payer: Self-pay | Admitting: Internal Medicine

## 2015-07-06 ENCOUNTER — Other Ambulatory Visit: Payer: Self-pay | Admitting: Internal Medicine

## 2015-07-21 ENCOUNTER — Other Ambulatory Visit: Payer: Self-pay | Admitting: Internal Medicine

## 2015-07-22 ENCOUNTER — Encounter: Payer: Self-pay | Admitting: Internal Medicine

## 2015-07-22 ENCOUNTER — Other Ambulatory Visit: Payer: Self-pay | Admitting: *Deleted

## 2015-07-22 ENCOUNTER — Ambulatory Visit (INDEPENDENT_AMBULATORY_CARE_PROVIDER_SITE_OTHER): Payer: Self-pay | Admitting: Internal Medicine

## 2015-07-22 VITALS — BP 150/90 | HR 72 | Temp 98.3°F | Resp 20 | Ht 64.0 in | Wt 194.0 lb

## 2015-07-22 DIAGNOSIS — I10 Essential (primary) hypertension: Secondary | ICD-10-CM

## 2015-07-22 DIAGNOSIS — C439 Malignant melanoma of skin, unspecified: Secondary | ICD-10-CM

## 2015-07-22 DIAGNOSIS — C7800 Secondary malignant neoplasm of unspecified lung: Secondary | ICD-10-CM

## 2015-07-22 DIAGNOSIS — E785 Hyperlipidemia, unspecified: Secondary | ICD-10-CM

## 2015-07-22 DIAGNOSIS — E119 Type 2 diabetes mellitus without complications: Secondary | ICD-10-CM

## 2015-07-22 LAB — HEMOGLOBIN A1C: Hgb A1c MFr Bld: 8 % — ABNORMAL HIGH (ref 4.6–6.5)

## 2015-07-22 MED ORDER — PIOGLITAZONE HCL 15 MG PO TABS: 15.0000 mg | ORAL_TABLET | Freq: Every day | ORAL | Status: DC

## 2015-07-22 NOTE — Patient Instructions (Addendum)
Limit your sodium (Salt) intake  You need to lose weight.  Consider a lower calorie diet and regular exercise.    It is important that you exercise regularly, at least 20 minutes 3 to 4 times per week.  If you develop chest pain or shortness of breath seek  medical attention.  Return in 6 months for follow-up  Please see your eye doctor yearly to check for diabetic eye damage

## 2015-07-22 NOTE — Progress Notes (Signed)
Pre visit review using our clinic review tool, if applicable. No additional management support is needed unless otherwise documented below in the visit note. 

## 2015-07-22 NOTE — Progress Notes (Signed)
Subjective:    Patient ID: Kenneth [name], male    DOB: 06/10/1961, 54 y.o.   MRN: [id number]  HPI  Lab Results  Component Value Date   HGBA1C 6.3 04/16/2014    Wt Readings from Last 3 Encounters:  07/22/15 194 lb (87.998 kg)  01/21/15 196 lb (88.905 kg)  07/20/14 199 lb (90.266 kg)    BP Readings from Last 3 Encounters:  07/22/15 150/90  01/21/15 124/80  07/20/14 93/26    54 year old patient who is seen today for follow-up of type 2 diabetes.  He has treated hypertension.  He is followed at [facility] for metastatic melanoma which has been in remission.  Doing quite well today.  He has obesity and has had some very modest weight loss.  Generally feels quite well.  He has been compliant with his medications.  Still no health insurance  No past medical history on file.  Social History   Social History  . Marital Status: Single    Spouse Name: N/A  . Number of Children: N/A  . Years of Education: N/A   Occupational History  . Not on file.   Social History Main Topics  . Smoking status: Never Smoker   . Smokeless tobacco: Never Used  . Alcohol Use: No  . Drug Use: No  . Sexual Activity: Not on file   Other Topics Concern  . Not on file   Social History Narrative    No past surgical history on file.  No family history on file.  No Known Allergies  Current Outpatient Prescriptions on File Prior to Visit  Medication Sig Dispense Refill  . amLODipine (NORVASC) 5 MG tablet TAKE 1 TABLET BY MOUTH EVERY DAY 90 tablet 3  . atenolol (TENORMIN) 100 MG tablet TAKE 1 TABLET BY MOUTH EVERY DAY 90 tablet 3  . glimepiride (AMARYL) 2 MG tablet TAKE 1/2 TABLET BY MOUTH EVERY DAY BEFORE BREAKFAST 90 tablet 0  . lisinopril-hydrochlorothiazide (PRINZIDE,ZESTORETIC) 20-12.5 MG per tablet TAKE 2 TABLETS BY MOUTH DAILY. 180 tablet 1  . metFORMIN (GLUCOPHAGE) 1000 MG tablet TAKE 1 TABLET BY MOUTH TWICE A DAY WITH A MEAL 180 tablet 1  . MULTIPLE VITAMIN PO Take 1 tablet by  mouth daily.    . naproxen sodium (ALEVE) 220 MG tablet Take 220 mg by mouth as needed.     Marland Kitchen atorvastatin (LIPITOR) 20 MG tablet TAKE 1 TABLET BY MOUTH EVERY DAY 90 tablet 1   No current facility-administered medications on file prior to visit.    BP 150/90 mmHg  Pulse 72  Temp(Src) 98.3 F (36.8 C) (Oral)  Resp 20  Ht 5\' 4"  (1.626 m)  Wt 194 lb (87.998 kg)  BMI 33.28 kg/m2  SpO2 97%      Review of Systems  Constitutional: Negative for fever, chills, appetite change and fatigue.  HENT: Negative for congestion, dental problem, ear pain, hearing loss, sore throat, tinnitus, trouble swallowing and voice change.   Eyes: Negative for pain, discharge and visual disturbance.  Respiratory: Negative for cough, chest tightness, wheezing and stridor.   Cardiovascular: Negative for chest pain, palpitations and leg swelling.  Gastrointestinal: Negative for nausea, vomiting, abdominal pain, diarrhea, constipation, blood in stool and abdominal distention.  Genitourinary: Negative for urgency, hematuria, flank pain, discharge, difficulty urinating and genital sores.  Musculoskeletal: Negative for myalgias, back pain, joint swelling, arthralgias, gait problem and neck stiffness.  Skin: Negative for rash.  Neurological: Negative for dizziness, syncope, speech difficulty, weakness, numbness and headaches.  Hematological: Negative for adenopathy. Does not bruise/bleed easily.  Psychiatric/Behavioral: Negative for behavioral problems and dysphoric mood. The patient is not nervous/anxious.        Objective:   Physical Exam  Constitutional: He is oriented to person, place, and time. He appears well-developed.  Blood pressure 140/88  HENT:  Head: Normocephalic.  Right Ear: External ear normal.  Left Ear: External ear normal.  Eyes: Conjunctivae and EOM are normal.  Neck: Normal range of motion.  Cardiovascular: Normal rate and normal heart sounds.   Pulmonary/Chest: Breath sounds normal.    Abdominal: Bowel sounds are normal.  Musculoskeletal: Normal range of motion. He exhibits no edema or tenderness.  Neurological: He is alert and oriented to person, place, and time.  Psychiatric: He has a normal mood and affect. His behavior is normal.          Assessment & Plan:   Hypertension.  Will monitor home blood pressure readings more carefully.  No change in medical regimen.  Weight loss low salt diet all encouraged Diabetes mellitus.  Will check a hemoglobin A1c.  Annual eye examination recommended Metastatic melanoma.  Follow-up [facility] as scheduled next month  Return here in 6 months or as needed

## 2015-10-09 ENCOUNTER — Other Ambulatory Visit: Payer: Self-pay | Admitting: Internal Medicine

## 2015-10-11 MED ORDER — GLIMEPIRIDE 2 MG PO TABS: ORAL_TABLET | ORAL | Status: DC

## 2015-10-11 NOTE — Addendum Note (Signed)
Addended by: Marian Sorrow on: 10/11/2015 01:39 PM   Modules accepted: Orders

## 2015-10-18 ENCOUNTER — Other Ambulatory Visit: Payer: Self-pay | Admitting: Internal Medicine

## 2015-10-26 ENCOUNTER — Other Ambulatory Visit: Payer: Self-pay | Admitting: Internal Medicine

## 2015-10-26 MED ORDER — PIOGLITAZONE HCL 15 MG PO TABS: 15.0000 mg | ORAL_TABLET | Freq: Every day | ORAL | Status: DC

## 2015-11-22 ENCOUNTER — Ambulatory Visit (INDEPENDENT_AMBULATORY_CARE_PROVIDER_SITE_OTHER): Payer: Self-pay | Admitting: Internal Medicine

## 2015-11-22 ENCOUNTER — Encounter: Payer: Self-pay | Admitting: Internal Medicine

## 2015-11-22 VITALS — BP 118/80 | HR 78 | Temp 98.3°F | Ht 64.0 in | Wt 200.0 lb

## 2015-11-22 DIAGNOSIS — I10 Essential (primary) hypertension: Secondary | ICD-10-CM

## 2015-11-22 DIAGNOSIS — E785 Hyperlipidemia, unspecified: Secondary | ICD-10-CM

## 2015-11-22 DIAGNOSIS — E119 Type 2 diabetes mellitus without complications: Secondary | ICD-10-CM

## 2015-11-22 LAB — HEMOGLOBIN A1C: HEMOGLOBIN A1C: 7 % — AB (ref 4.6–6.5)

## 2015-11-22 MED ORDER — PIOGLITAZONE HCL 45 MG PO TABS: 45.0000 mg | ORAL_TABLET | Freq: Every day | ORAL | Status: DC

## 2015-11-22 MED ORDER — GLIMEPIRIDE 2 MG PO TABS: 2.0000 mg | ORAL_TABLET | Freq: Every day | ORAL | Status: DC

## 2015-11-22 NOTE — Progress Notes (Signed)
Subjective:    Patient ID: Kenneth [name], male    DOB: [birth year], 54 y.o.   MRN: [id number]  HPI  Lab Results  Component Value Date   HGBA1C 8.0* 07/22/2015   Wt Readings from Last 3 Encounters:  11/22/15 200 lb (90.719 kg)  07/22/15 194 lb (87.998 kg)  01/21/15 196 lb (88.35 kg)   54 year old patient who has treated hypertension and type 2 diabetes.  Last hemoglobin A1c not well controlled.  His weight is up.  He has been compliant with his medications.  No insurance and no home blood sugar monitoring.  He generally feels well  He is followed at [facility] for metastatic melanoma.  Recent brain MRI has been normal.  He is now scheduled for follow-up in 9 months.  He has treated hypertension.  No past medical history on file.  Social History   Social History  . Marital Status: Single    Spouse Name: N/A  . Number of Children: N/A  . Years of Education: N/A   Occupational History  . Not on file.   Social History Main Topics  . Smoking status: Never Smoker   . Smokeless tobacco: Never Used  . Alcohol Use: No  . Drug Use: No  . Sexual Activity: Not on file   Other Topics Concern  . Not on file   Social History Narrative    No past surgical history on file.  No family history on file.  No Known Allergies  Current Outpatient Prescriptions on File Prior to Visit  Medication Sig Dispense Refill  . amLODipine (NORVASC) 5 MG tablet TAKE 1 TABLET BY MOUTH EVERY DAY 90 tablet 3  . atenolol (TENORMIN) 100 MG tablet TAKE 1 TABLET BY MOUTH EVERY DAY 90 tablet 3  . atorvastatin (LIPITOR) 20 MG tablet TAKE 1 TABLET BY MOUTH EVERY DAY 90 tablet 1  . glimepiride (AMARYL) 2 MG tablet TAKE 1/2 TABLET BY MOUTH EVERY DAY BEFORE BREAKFAST 90 tablet 1  . lisinopril-hydrochlorothiazide (PRINZIDE,ZESTORETIC) 20-12.5 MG tablet TAKE 2 TABLETS BY MOUTH DAILY. 180 tablet 1  . metFORMIN (GLUCOPHAGE) 1000 MG tablet TAKE 1 TABLET BY MOUTH TWICE A DAY WITH A MEAL 180 tablet 1  .  MULTIPLE VITAMIN PO Take 1 tablet by mouth daily.    . naproxen sodium (ALEVE) 220 MG tablet Take 220 mg by mouth as needed.     . pioglitazone (ACTOS) 15 MG tablet Take 1 tablet (15 mg total) by mouth daily. 90 tablet 3   No current facility-administered medications on file prior to visit.    BP 118/80 mmHg  Pulse 78  Temp(Src) 98.3 F (36.8 C) (Oral)  Ht 5\' 4"  (1.626 m)  Wt 200 lb (90.719 kg)  BMI 34.31 kg/m2  SpO2 96%      Review of Systems  Constitutional: Negative for fever, chills, appetite change and fatigue.  HENT: Negative for congestion, dental problem, ear pain, hearing loss, sore throat, tinnitus, trouble swallowing and voice change.   Eyes: Negative for pain, discharge and visual disturbance.  Respiratory: Negative for cough, chest tightness, wheezing and stridor.   Cardiovascular: Negative for chest pain, palpitations and leg swelling.  Gastrointestinal: Negative for nausea, vomiting, abdominal pain, diarrhea, constipation, blood in stool and abdominal distention.  Genitourinary: Negative for urgency, hematuria, flank pain, discharge, difficulty urinating and genital sores.  Musculoskeletal: Negative for myalgias, back pain, joint swelling, arthralgias, gait problem and neck stiffness.  Skin: Negative for rash.  Neurological: Negative for dizziness, syncope, speech difficulty,  weakness, numbness and headaches.  Hematological: Negative for adenopathy. Does not bruise/bleed easily.  Psychiatric/Behavioral: Negative for behavioral problems and dysphoric mood. The patient is not nervous/anxious.        Objective:   Physical Exam  Constitutional: He is oriented to person, place, and time. He appears well-developed.  Weight 200 Repeat blood pressure 140/90  HENT:  Head: Normocephalic.  Right Ear: External ear normal.  Left Ear: External ear normal.  Eyes: Conjunctivae and EOM are normal.  Neck: Normal range of motion.  Cardiovascular: Normal rate and normal  heart sounds.   Pulmonary/Chest: Breath sounds normal.  Abdominal: Bowel sounds are normal.  Musculoskeletal: Normal range of motion. He exhibits no edema or tenderness.  Neurological: He is alert and oriented to person, place, and time.  Psychiatric: He has a normal mood and affect. His behavior is normal.          Assessment & Plan:   Diabetes mellitus.  Will check hemoglobin A1c.  Increase Actos to 45 mg daily, may need to up titrate glimepiride Hypertension Obesity/weight gain.  Nonpharmacologic measures discussed Dyslipidemia.  Continue statin therapy  increase Amaryl to 2.  Milligrams daily  Recheck 4 months

## 2015-11-22 NOTE — Progress Notes (Signed)
Pre visit review using our clinic review tool, if applicable. No additional management support is needed unless otherwise documented below in the visit note. 

## 2015-11-22 NOTE — Patient Instructions (Signed)
Limit your sodium (Salt) intake  Please check your blood pressure on a regular basis.  If it is consistently greater than 150/90, please make an office appointment.  You need to lose weight.  Consider a lower calorie diet and regular exercise.    It is important that you exercise regularly, at least 20 minutes 3 to 4 times per week.  If you develop chest pain or shortness of breath seek  medical attention. 

## 2016-01-20 ENCOUNTER — Other Ambulatory Visit: Payer: Self-pay

## 2016-01-24 ENCOUNTER — Encounter: Payer: Self-pay | Admitting: Internal Medicine

## 2016-02-06 ENCOUNTER — Other Ambulatory Visit: Payer: Self-pay | Admitting: Internal Medicine

## 2016-03-20 ENCOUNTER — Other Ambulatory Visit (INDEPENDENT_AMBULATORY_CARE_PROVIDER_SITE_OTHER): Payer: Self-pay

## 2016-03-20 DIAGNOSIS — Z0000 Encounter for general adult medical examination without abnormal findings: Secondary | ICD-10-CM

## 2016-03-20 LAB — HEPATIC FUNCTION PANEL
ALBUMIN: 4.2 g/dL (ref 3.5–5.2)
ALT: 14 U/L (ref 0–53)
AST: 14 U/L (ref 0–37)
Alkaline Phosphatase: 36 U/L — ABNORMAL LOW (ref 39–117)
Bilirubin, Direct: 0.1 mg/dL (ref 0.0–0.3)
TOTAL PROTEIN: 6.9 g/dL (ref 6.0–8.3)
Total Bilirubin: 0.4 mg/dL (ref 0.2–1.2)

## 2016-03-20 LAB — POC URINALSYSI DIPSTICK (AUTOMATED)
BILIRUBIN UA: NEGATIVE
GLUCOSE UA: NEGATIVE
KETONES UA: NEGATIVE
Leukocytes, UA: NEGATIVE
Nitrite, UA: NEGATIVE
PH UA: 6.5
SPEC GRAV UA: 1.02
Urobilinogen, UA: 0.2

## 2016-03-20 LAB — PSA: PSA: 1.77 ng/mL (ref 0.10–4.00)

## 2016-03-20 LAB — LIPID PANEL
CHOLESTEROL: 156 mg/dL (ref 0–200)
HDL: 62.5 mg/dL (ref >39.00)
LDL CALC: 70 mg/dL (ref 0–99)
NonHDL: 93.9
TRIGLYCERIDES: 119 mg/dL (ref 0.0–149.0)
Total CHOL/HDL Ratio: 3
VLDL: 23.8 mg/dL (ref 0.0–40.0)

## 2016-03-20 LAB — CBC WITH DIFFERENTIAL/PLATELET
Basophils Absolute: 0 10*3/uL (ref 0.0–0.1)
Basophils Relative: 0.6 % (ref 0.0–3.0)
EOS PCT: 3.4 % (ref 0.0–5.0)
Eosinophils Absolute: 0.3 10*3/uL (ref 0.0–0.7)
HCT: 44 % (ref 39.0–52.0)
HEMOGLOBIN: 15.3 g/dL (ref 13.0–17.0)
LYMPHS ABS: 1.8 10*3/uL (ref 0.7–4.0)
Lymphocytes Relative: 22 % (ref 12.0–46.0)
MCHC: 34.6 g/dL (ref 30.0–36.0)
MCV: 92.4 fl (ref 78.0–100.0)
MONO ABS: 1 10*3/uL (ref 0.1–1.0)
MONOS PCT: 12.1 % — AB (ref 3.0–12.0)
Neutro Abs: 5 10*3/uL (ref 1.4–7.7)
Neutrophils Relative %: 61.9 % (ref 43.0–77.0)
Platelets: 321 10*3/uL (ref 150.0–400.0)
RBC: 4.77 Mil/uL (ref 4.22–5.81)
RDW: 12.8 % (ref 11.5–15.5)
WBC: 8.1 10*3/uL (ref 4.0–10.5)

## 2016-03-20 LAB — TSH: TSH: 0.7 u[IU]/mL (ref 0.35–4.50)

## 2016-03-20 LAB — BASIC METABOLIC PANEL
BUN: 8 mg/dL (ref 6–23)
CHLORIDE: 99 meq/L (ref 96–112)
CO2: 32 mEq/L (ref 19–32)
Calcium: 10 mg/dL (ref 8.4–10.5)
Creatinine, Ser: 1.01 mg/dL (ref 0.40–1.50)
GFR: 81.51 mL/min (ref >60.00)
Glucose, Bld: 118 mg/dL — ABNORMAL HIGH (ref 70–99)
POTASSIUM: 4.1 meq/L (ref 3.5–5.1)
SODIUM: 142 meq/L (ref 135–145)

## 2016-03-27 ENCOUNTER — Encounter: Payer: Self-pay | Admitting: Internal Medicine

## 2016-03-27 ENCOUNTER — Ambulatory Visit (INDEPENDENT_AMBULATORY_CARE_PROVIDER_SITE_OTHER): Payer: Self-pay | Admitting: Internal Medicine

## 2016-03-27 VITALS — BP 140/82 | HR 76 | Temp 98.7°F | Resp 20 | Ht 63.5 in | Wt 200.0 lb

## 2016-03-27 DIAGNOSIS — E785 Hyperlipidemia, unspecified: Secondary | ICD-10-CM

## 2016-03-27 DIAGNOSIS — Z0000 Encounter for general adult medical examination without abnormal findings: Secondary | ICD-10-CM

## 2016-03-27 DIAGNOSIS — I10 Essential (primary) hypertension: Secondary | ICD-10-CM

## 2016-03-27 DIAGNOSIS — E119 Type 2 diabetes mellitus without complications: Secondary | ICD-10-CM

## 2016-03-27 NOTE — Progress Notes (Signed)
Pre visit review using our clinic review tool, if applicable. No additional management support is needed unless otherwise documented below in the visit note. 

## 2016-03-27 NOTE — Patient Instructions (Signed)
Limit your sodium (Salt) intake   Please check your hemoglobin A1c every 3-6  Months    It is important that you exercise regularly, at least 20 minutes 3 to 4 times per week.  If you develop chest pain or shortness of breath seek  medical attention.  You need to lose weight.  Consider a lower calorie diet and regular exercise.  Please see your eye doctor yearly to check for diabetic eye damage  Schedule your colonoscopy to help detect colon cancer.

## 2016-03-27 NOTE — Progress Notes (Signed)
Subjective:    Patient ID: Kenneth [name], male    DOB: 1961/04/24, 55 y.o.   MRN: [id number]  HPI   55 year-old patient who is seen today for a preventive health examination.  Medical problems include type 2 diabetes.  His last hemoglobin A1c 6 point 2.  He has hypertension and dyslipidemia. He is followed at [facility] biannually due to metastatic melanoma.  Evaluation includes labs brain MRI and total body PET scanning.  He feels well.  He has been in remission for some time  He has recently been hired on full time and now has Scientist, product/process development pending. No screening colonoscopies No recent eye exam  Lab Results  Component Value Date   HGBA1C 7.0* 11/22/2015     No past medical history on file.  Social History   Social History  . Marital Status: Single    Spouse Name: N/A  . Number of Children: N/A  . Years of Education: N/A   Occupational History  . Not on file.   Social History Main Topics  . Smoking status: Never Smoker   . Smokeless tobacco: Never Used  . Alcohol Use: No  . Drug Use: No  . Sexual Activity: Not on file   Other Topics Concern  . Not on file   Social History Narrative    No past surgical history on file.  No family history on file.  No Known Allergies  Current Outpatient Prescriptions on File Prior to Visit  Medication Sig Dispense Refill  . amLODipine (NORVASC) 5 MG tablet TAKE 1 TABLET BY MOUTH EVERY DAY 90 tablet 3  . atenolol (TENORMIN) 100 MG tablet TAKE 1 TABLET BY MOUTH EVERY DAY 90 tablet 3  . atorvastatin (LIPITOR) 20 MG tablet TAKE 1 TABLET BY MOUTH EVERY DAY 90 tablet 2  . glimepiride (AMARYL) 2 MG tablet Take 1 tablet (2 mg total) by mouth daily with breakfast. TAKE 1/2 TABLET BY MOUTH EVERY DAY BEFORE BREAKFAST 90 tablet 1  . lisinopril-hydrochlorothiazide (PRINZIDE,ZESTORETIC) 20-12.5 MG tablet TAKE 2 TABLETS BY MOUTH DAILY. 180 tablet 1  . metFORMIN (GLUCOPHAGE) 1000 MG tablet TAKE 1 TABLET BY MOUTH TWICE A DAY WITH A  MEAL 180 tablet 1  . MULTIPLE VITAMIN PO Take 1 tablet by mouth daily.    . naproxen sodium (ALEVE) 220 MG tablet Take 220 mg by mouth as needed.     . pioglitazone (ACTOS) 45 MG tablet Take 1 tablet (45 mg total) by mouth daily. 90 tablet 4   No current facility-administered medications on file prior to visit.    BP 140/82 mmHg  Pulse 76  Temp(Src) 98.7 F (37.1 C) (Oral)  Resp 20  Ht 5' 3.5" (1.613 m)  Wt 200 lb (90.719 kg)  BMI 34.87 kg/m2  SpO2 97%     Review of Systems  Constitutional: Negative for fever, chills, appetite change and fatigue.  HENT: Negative for congestion, dental problem, ear pain, hearing loss, sore throat, tinnitus, trouble swallowing and voice change.   Eyes: Negative for pain, discharge and visual disturbance.  Respiratory: Negative for cough, chest tightness, wheezing and stridor.   Cardiovascular: Negative for chest pain, palpitations and leg swelling.  Gastrointestinal: Negative for nausea, vomiting, abdominal pain, diarrhea, constipation, blood in stool and abdominal distention.  Genitourinary: Negative for urgency, hematuria, flank pain, discharge, difficulty urinating and genital sores.  Musculoskeletal: Negative for myalgias, back pain, joint swelling, arthralgias, gait problem and neck stiffness.  Skin: Negative for rash.  Neurological: Negative for  dizziness, syncope, speech difficulty, weakness, numbness and headaches.  Hematological: Negative for adenopathy. Does not bruise/bleed easily.  Psychiatric/Behavioral: Negative for behavioral problems and dysphoric mood. The patient is not nervous/anxious.        Objective:   Physical Exam  Constitutional: He appears well-developed and well-nourished.  Repeat blood pressure 130/80 Obese  HENT:  Head: Normocephalic and atraumatic.  Right Ear: External ear normal.  Left Ear: External ear normal.  Nose: Nose normal.  Mouth/Throat: Oropharynx is clear and moist.  Pharyngeal crowding  Eyes:  Conjunctivae and EOM are normal. Pupils are equal, round, and reactive to light. No scleral icterus.  Neck: Normal range of motion. Neck supple. No JVD present. No thyromegaly present.  Cardiovascular: Regular rhythm, normal heart sounds and intact distal pulses.  Exam reveals no gallop and no friction rub.   No murmur heard. Pulmonary/Chest: Effort normal and breath sounds normal. He exhibits no tenderness.  Abdominal: Soft. Bowel sounds are normal. He exhibits no distension and no mass. There is no tenderness.  Genitourinary: Prostate normal and penis normal.  Musculoskeletal: Normal range of motion. He exhibits no edema or tenderness.  Lymphadenopathy:    He has no cervical adenopathy.  Neurological: He is alert. He has normal reflexes. No cranial nerve deficit. Coordination normal.  Skin: Skin is warm and dry. No rash noted.  Surgical scarring, right anterior thigh  Psychiatric: He has a normal mood and affect. His behavior is normal.          Assessment & Plan:  Preventive health examination  Needs annual eye examination and screening colonoscopy  Diabetes mellitus. Dyslipidemia Hypertension controlled OSA suspect.  Weight loss encouraged.  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  Close follow-up [facility]  Recheck 6 months

## 2016-05-04 ENCOUNTER — Other Ambulatory Visit: Payer: Self-pay | Admitting: Internal Medicine

## 2016-05-19 ENCOUNTER — Other Ambulatory Visit: Payer: Self-pay | Admitting: Internal Medicine

## 2016-07-24 ENCOUNTER — Ambulatory Visit: Payer: Self-pay | Admitting: Internal Medicine

## 2016-07-28 ENCOUNTER — Other Ambulatory Visit: Payer: Self-pay | Admitting: Internal Medicine

## 2016-07-31 ENCOUNTER — Encounter: Payer: Self-pay | Admitting: Internal Medicine

## 2016-07-31 ENCOUNTER — Ambulatory Visit (INDEPENDENT_AMBULATORY_CARE_PROVIDER_SITE_OTHER): Payer: 59 | Admitting: Internal Medicine

## 2016-07-31 VITALS — BP 160/92 | HR 70 | Temp 98.2°F | Resp 20 | Ht 63.5 in | Wt 200.0 lb

## 2016-07-31 DIAGNOSIS — Z0000 Encounter for general adult medical examination without abnormal findings: Secondary | ICD-10-CM

## 2016-07-31 DIAGNOSIS — E785 Hyperlipidemia, unspecified: Secondary | ICD-10-CM

## 2016-07-31 DIAGNOSIS — I10 Essential (primary) hypertension: Secondary | ICD-10-CM

## 2016-07-31 DIAGNOSIS — E119 Type 2 diabetes mellitus without complications: Secondary | ICD-10-CM | POA: Diagnosis not present

## 2016-07-31 LAB — MICROALBUMIN / CREATININE URINE RATIO
CREATININE, U: 107.2 mg/dL
MICROALB/CREAT RATIO: 7.4 mg/g (ref 0.0–30.0)
Microalb, Ur: 7.9 mg/dL — ABNORMAL HIGH (ref 0.0–1.9)

## 2016-07-31 LAB — HEMOGLOBIN A1C: Hgb A1c MFr Bld: 6 % (ref 4.6–6.5)

## 2016-07-31 NOTE — Progress Notes (Signed)
Pre visit review using our clinic review tool, if applicable. No additional management support is needed unless otherwise documented below in the visit note. 

## 2016-07-31 NOTE — Patient Instructions (Addendum)
Limit your sodium (Salt) intake   Please check your hemoglobin A1c every 3-6  Months  You need to lose weight.  Consider a lower calorie diet and regular exercise.    It is important that you exercise regularly, at least 20 minutes 3 to 4 times per week.  If you develop chest pain or shortness of breath seek  medical attention.  Please see your eye doctor yearly to check for diabetic eye damage

## 2016-07-31 NOTE — Progress Notes (Signed)
Subjective:    Patient ID: Kenneth [name], male    DOB: 1961/01/05, 55 y.o.   MRN: [id number]  HPI  55 year old patient who is seen today for follow-up.  He has type 2 diabetes, obesity and essential hypertension. He has a history of metastatic melanoma and is scheduled for follow-up at [facility] next week.  This will include lab scan and MRI. Clinically doing well Has been compliant with his medications Denies any cardiopulmonary complaints  States that he is now working full-time  No past medical history on file.   Social History   Social History  . Marital status: Single    Spouse name: N/A  . Number of children: N/A  . Years of education: N/A   Occupational History  . Not on file.   Social History Main Topics  . Smoking status: Never Smoker  . Smokeless tobacco: Never Used  . Alcohol use No  . Drug use: No  . Sexual activity: Not on file   Other Topics Concern  . Not on file   Social History Narrative  . No narrative on file    No past surgical history on file.  No family history on file.  No Known Allergies  Current Outpatient Prescriptions on File Prior to Visit  Medication Sig Dispense Refill  . amLODipine (NORVASC) 5 MG tablet TAKE 1 TABLET BY MOUTH EVERY DAY 90 tablet 2  . atenolol (TENORMIN) 100 MG tablet TAKE 1 TABLET BY MOUTH EVERY DAY 90 tablet 3  . atorvastatin (LIPITOR) 20 MG tablet TAKE 1 TABLET BY MOUTH EVERY DAY 90 tablet 2  . glimepiride (AMARYL) 2 MG tablet Take 1 tablet (2 mg total) by mouth daily with breakfast. TAKE 1/2 TABLET BY MOUTH EVERY DAY BEFORE BREAKFAST 90 tablet 1  . lisinopril-hydrochlorothiazide (PRINZIDE,ZESTORETIC) 20-12.5 MG tablet TAKE 2 TABLETS BY MOUTH EVERY DAY 180 tablet 1  . metFORMIN (GLUCOPHAGE) 1000 MG tablet TAKE 1 TABLET BY MOUTH TWICE A DAY WITH A MEAL 180 tablet 1  . MULTIPLE VITAMIN PO Take 1 tablet by mouth daily.    . naproxen sodium (ALEVE) 220 MG tablet Take 220 mg by mouth as needed.     .  pioglitazone (ACTOS) 45 MG tablet Take 1 tablet (45 mg total) by mouth daily. 90 tablet 4   No current facility-administered medications on file prior to visit.     BP (!) 160/92 (BP Location: Right Arm, Patient Position: Sitting, Cuff Size: Normal)   Pulse 70   Temp 98.2 F (36.8 C) (Oral)   Resp 20   Ht 5' 3.5" (1.613 m)   Wt 200 lb (90.7 kg)   SpO2 98%   BMI 34.87 kg/m     Review of Systems  Constitutional: Negative for appetite change, chills, fatigue and fever.  HENT: Negative for congestion, dental problem, ear pain, hearing loss, sore throat, tinnitus, trouble swallowing and voice change.   Eyes: Negative for pain, discharge and visual disturbance.  Respiratory: Negative for cough, chest tightness, wheezing and stridor.   Cardiovascular: Negative for chest pain, palpitations and leg swelling.  Gastrointestinal: Negative for abdominal distention, abdominal pain, blood in stool, constipation, diarrhea, nausea and vomiting.  Genitourinary: Negative for difficulty urinating, discharge, flank pain, genital sores, hematuria and urgency.  Musculoskeletal: Negative for arthralgias, back pain, gait problem, joint swelling, myalgias and neck stiffness.  Skin: Negative for rash.  Neurological: Negative for dizziness, syncope, speech difficulty, weakness, numbness and headaches.  Hematological: Negative for adenopathy. Does not bruise/bleed easily.  Psychiatric/Behavioral: Negative for behavioral problems and dysphoric mood. The patient is not nervous/anxious.        Objective:   Physical Exam  Constitutional: He is oriented to person, place, and time. He appears well-developed.  Overweight.  Weight 200 pounds Repeat blood pressure 144/90  HENT:  Head: Normocephalic.  Right Ear: External ear normal.  Left Ear: External ear normal.  Pharyngeal crowding  Eyes: Conjunctivae and EOM are normal.  Neck: Normal range of motion.  Cardiovascular: Normal rate and normal heart sounds.     Pulmonary/Chest: Breath sounds normal.  Abdominal: Bowel sounds are normal.  Musculoskeletal: Normal range of motion. He exhibits no edema or tenderness.  Neurological: He is alert and oriented to person, place, and time.  Psychiatric: He has a normal mood and affect. His behavior is normal.          Assessment & Plan:   Diabetes mellitus.  Will check hemoglobin A1c and urine for microalbumin.  Annual eye examination encouraged Hypertension.  Blood pressure borderline high.  Compliance with medications.  Encouraged weight loss low salt diet and exercise all recommended Obesity Dyslipidemia.  Continue statin therapy History metastatic melanoma.  Follow-up [facility] next week as scheduled  Recheck 4 months  [name], MD

## 2016-08-03 ENCOUNTER — Other Ambulatory Visit: Payer: Self-pay | Admitting: Internal Medicine

## 2016-08-08 DIAGNOSIS — C7931 Secondary malignant neoplasm of brain: Secondary | ICD-10-CM | POA: Insufficient documentation

## 2016-08-31 ENCOUNTER — Encounter: Payer: Self-pay | Admitting: Internal Medicine

## 2016-09-27 ENCOUNTER — Other Ambulatory Visit: Payer: Self-pay | Admitting: Internal Medicine

## 2016-10-18 ENCOUNTER — Encounter: Payer: Self-pay | Admitting: Gastroenterology

## 2016-10-25 ENCOUNTER — Telehealth: Payer: Self-pay | Admitting: Internal Medicine

## 2016-10-25 MED ORDER — METOPROLOL TARTRATE 50 MG PO TABS: 50.0000 mg | ORAL_TABLET | Freq: Two times a day (BID) | ORAL | 5 refills | Status: DC

## 2016-10-25 NOTE — Telephone Encounter (Signed)
Left detailed message on voicemail that new Rx was sent to pharmacy due to Atenolol is not available. Please call office if any questions.

## 2016-10-25 NOTE — Telephone Encounter (Signed)
Start metoprolol 50mg  BID. Stop atenolol. I sent this in.

## 2016-10-25 NOTE — Telephone Encounter (Signed)
Pharmacy states there is now a shortage of atenolol (TENORMIN) 100 MG tablet  They recommend switching to  metoprolol succinate if OK with the dr.  Please call [facility]/ [address]

## 2016-10-25 NOTE — Telephone Encounter (Signed)
Please see message and advise 

## 2016-10-27 NOTE — Telephone Encounter (Signed)
Spoke to pt, asked him if he got my voicemail regarding new Rx to pharmacy for Metoprolol to replace Atenolol? Pt said yes and he picked it up. Told pt okay just wanted to make sure.

## 2016-11-20 ENCOUNTER — Telehealth: Payer: Self-pay | Admitting: Internal Medicine

## 2016-11-20 ENCOUNTER — Encounter: Payer: Self-pay | Admitting: Internal Medicine

## 2016-11-20 ENCOUNTER — Ambulatory Visit (INDEPENDENT_AMBULATORY_CARE_PROVIDER_SITE_OTHER): Payer: 59 | Admitting: Internal Medicine

## 2016-11-20 VITALS — BP 168/98 | Temp 98.4°F | Ht 63.5 in | Wt 203.4 lb

## 2016-11-20 DIAGNOSIS — I10 Essential (primary) hypertension: Secondary | ICD-10-CM | POA: Diagnosis not present

## 2016-11-20 DIAGNOSIS — E119 Type 2 diabetes mellitus without complications: Secondary | ICD-10-CM

## 2016-11-20 DIAGNOSIS — E785 Hyperlipidemia, unspecified: Secondary | ICD-10-CM

## 2016-11-20 LAB — HEMOGLOBIN A1C: Hgb A1c MFr Bld: 6.3 % (ref 4.6–6.5)

## 2016-11-20 MED ORDER — AMLODIPINE BESYLATE 10 MG PO TABS: 5.0000 mg | ORAL_TABLET | Freq: Every day | ORAL | 4 refills | Status: DC

## 2016-11-20 MED ORDER — AMLODIPINE BESYLATE 10 MG PO TABS: 10.0000 mg | ORAL_TABLET | Freq: Every day | ORAL | 4 refills | Status: DC

## 2016-11-20 NOTE — Telephone Encounter (Signed)
Called and spoke with Nicki Reaper at [place]. Informed scott that according to patients medication list pt is taking Amlodipine 10 take one tablet once daily. Understanding verbalized nothing further needed at this time.

## 2016-11-20 NOTE — Progress Notes (Signed)
Pre visit review using our clinic review tool, if applicable. No additional management support is needed unless otherwise documented below in the visit note. 

## 2016-11-20 NOTE — Telephone Encounter (Signed)
° ° °  Pharmacy call to clarify the dosage. Said they received two different rx.  One said take 1 tablet and then the other said 1/2 tablet .   Pharmacy would like a call back with correct dosage  amLODipine (NORVASC) 10 MG tablet

## 2016-11-20 NOTE — Patient Instructions (Signed)
Limit your sodium (Salt) intake  Please check your blood pressure on a regular basis.  If it is consistently greater than 150/90, please make an office appointment.   Please check your hemoglobin A1c every 3 months  

## 2016-11-20 NOTE — Progress Notes (Signed)
Subjective:    Patient ID: Kenneth [name], male    DOB: 1961/08/19, 55 y.o.   MRN: [id number]  HPI Lab Results  Component Value Date   HGBA1C 6.0 07/31/2016   BP Readings from Last 3 Encounters:  11/20/16 (!) 168/98  07/31/16 (!) 160/92  03/27/16 140/82   Wt Readings from Last 3 Encounters:  11/20/16 203 lb 6.4 oz (92.3 kg)  07/31/16 200 lb (90.7 kg)  03/27/16 200 lb (74.71 kg)   55 year old patient who is seen today for follow-up of type 2 diabetes.  This has been well-controlled on oral medication He has obesity which unfortunately has not improved He has essential hypertension, controlled on multiple drugs.  No home blood pressure monitoring, although he does have a cuff.  He states he has been compliant with his medications  He was evaluated in the fall at [facility] for his metastatic melanoma.  He remains in remission.  Evaluation included both a brain MRI and PET scanning  General he feels well today  No eye exam this year Colonoscopy scheduled for 12/26/2016  No past medical history on file.   Social History   Social History  . Marital status: Single    Spouse name: N/A  . Number of children: N/A  . Years of education: N/A   Occupational History  . Not on file.   Social History Main Topics  . Smoking status: Never Smoker  . Smokeless tobacco: Never Used  . Alcohol use No  . Drug use: No  . Sexual activity: Not on file   Other Topics Concern  . Not on file   Social History Narrative  . No narrative on file    No past surgical history on file.  No family history on file.  Allergies  Allergen Reactions  . Cat Hair Extract Shortness Of Breath and Swelling    Swelling, watery eyes    Current Outpatient Prescriptions on File Prior to Visit  Medication Sig Dispense Refill  . amLODipine (NORVASC) 5 MG tablet TAKE 1 TABLET BY MOUTH EVERY DAY 90 tablet 2  . atorvastatin (LIPITOR) 20 MG tablet TAKE 1 TABLET BY MOUTH EVERY DAY 90 tablet 2  .  glimepiride (AMARYL) 2 MG tablet TAKE 1/2 TO 1 TABLET BY MOUTH EVERY DAY WITH BREAKFAST 90 tablet 1  . lisinopril-hydrochlorothiazide (PRINZIDE,ZESTORETIC) 20-12.5 MG tablet TAKE 2 TABLETS BY MOUTH EVERY DAY 180 tablet 1  . metFORMIN (GLUCOPHAGE) 1000 MG tablet TAKE 1 TABLET BY MOUTH TWICE A DAY WITH A MEAL 180 tablet 1  . metoprolol tartrate (LOPRESSOR) 50 MG tablet Take 1 tablet (50 mg total) by mouth 2 (two) times daily. 60 tablet 5  . MULTIPLE VITAMIN PO Take 1 tablet by mouth daily.    . naproxen sodium (ALEVE) 220 MG tablet Take 220 mg by mouth as needed.     . pioglitazone (ACTOS) 45 MG tablet Take 1 tablet (45 mg total) by mouth daily. 90 tablet 4   No current facility-administered medications on file prior to visit.     BP (!) 168/98 (BP Location: Left Arm)   Temp 98.4 F (36.9 C) (Oral)   Ht 5' 3.5" (1.613 m)   Wt 203 lb 6.4 oz (92.3 kg)   BMI 35.47 kg/m     Review of Systems  Constitutional: Negative for appetite change, chills, fatigue and fever.  HENT: Negative for congestion, dental problem, ear pain, hearing loss, sore throat, tinnitus, trouble swallowing and voice change.   Eyes: Negative  for pain, discharge and visual disturbance.  Respiratory: Negative for cough, chest tightness, wheezing and stridor.   Cardiovascular: Negative for chest pain, palpitations and leg swelling.  Gastrointestinal: Negative for abdominal distention, abdominal pain, blood in stool, constipation, diarrhea, nausea and vomiting.  Genitourinary: Negative for difficulty urinating, discharge, flank pain, genital sores, hematuria and urgency.  Musculoskeletal: Negative for arthralgias, back pain, gait problem, joint swelling, myalgias and neck stiffness.  Skin: Negative for rash.  Neurological: Negative for dizziness, syncope, speech difficulty, weakness, numbness and headaches.  Hematological: Negative for adenopathy. Does not bruise/bleed easily.  Psychiatric/Behavioral: Negative for behavioral  problems and dysphoric mood. The patient is not nervous/anxious.        Objective:   Physical Exam  Constitutional: He is oriented to person, place, and time. He appears well-developed.  Repeat blood pressure 150/84  Obese    HENT:  Head: Normocephalic.  Right Ear: External ear normal.  Left Ear: External ear normal.  Pharyngeal crowding  Eyes: Conjunctivae and EOM are normal.  Neck: Normal range of motion.  Cardiovascular: Normal rate and normal heart sounds.   Pulmonary/Chest: Breath sounds normal.  Abdominal: Bowel sounds are normal.  Musculoskeletal: Normal range of motion. He exhibits no edema or tenderness.  Neurological: He is alert and oriented to person, place, and time.  Psychiatric: He has a normal mood and affect. His behavior is normal.  Vitals reviewed.         Assessment & Plan:   Diabetes mellitus.  Well-controlled.  We'll check hemoglobin A1c Hypertension.  Suboptimal control.  Will increase amlodipine to 10 mg daily Obesity.  Weight loss encouraged History metastatic melanoma.  Presently in remission  [name]

## 2016-11-20 NOTE — Telephone Encounter (Signed)
Pharmacy would like to know if this is a new increase in his rx amLODipine (NORVASC) 10 MG tablet  according to previous refills pt was on 5 mg. Please have the dr confirm.  [facility]/ [address]

## 2016-11-21 NOTE — Telephone Encounter (Signed)
Called [facility] and spoke to [place] told him pt is suppose to be taking Amlodipine 10 mg one tablet daily. Allen verbalized understanding.

## 2016-11-26 ENCOUNTER — Other Ambulatory Visit: Payer: Self-pay | Admitting: Internal Medicine

## 2016-12-12 ENCOUNTER — Ambulatory Visit (AMBULATORY_SURGERY_CENTER): Payer: Self-pay | Admitting: *Deleted

## 2016-12-12 VITALS — Ht 63.5 in | Wt 204.4 lb

## 2016-12-12 DIAGNOSIS — Z1211 Encounter for screening for malignant neoplasm of colon: Secondary | ICD-10-CM

## 2016-12-12 MED ORDER — NA SULFATE-K SULFATE-MG SULF 17.5-3.13-1.6 GM/177ML PO SOLN: ORAL | 0 refills | Status: DC

## 2016-12-12 NOTE — Progress Notes (Signed)
No egg or soy allergy  No anesthesia or intubation problems per pt  No diet medications taken  No home oxygen used or hx of sleep apnea  $15 off Suprep coupon given

## 2016-12-26 ENCOUNTER — Ambulatory Visit (AMBULATORY_SURGERY_CENTER): Payer: 59 | Admitting: Gastroenterology

## 2016-12-26 ENCOUNTER — Encounter: Payer: Self-pay | Admitting: Gastroenterology

## 2016-12-26 VITALS — BP 144/85 | HR 73 | Temp 97.8°F | Resp 13 | Ht 63.0 in | Wt 204.0 lb

## 2016-12-26 DIAGNOSIS — D128 Benign neoplasm of rectum: Secondary | ICD-10-CM

## 2016-12-26 DIAGNOSIS — K621 Rectal polyp: Secondary | ICD-10-CM

## 2016-12-26 DIAGNOSIS — K635 Polyp of colon: Secondary | ICD-10-CM

## 2016-12-26 DIAGNOSIS — D124 Benign neoplasm of descending colon: Secondary | ICD-10-CM

## 2016-12-26 DIAGNOSIS — Z1211 Encounter for screening for malignant neoplasm of colon: Secondary | ICD-10-CM | POA: Diagnosis present

## 2016-12-26 DIAGNOSIS — D122 Benign neoplasm of ascending colon: Secondary | ICD-10-CM | POA: Diagnosis not present

## 2016-12-26 DIAGNOSIS — D126 Benign neoplasm of colon, unspecified: Secondary | ICD-10-CM | POA: Diagnosis not present

## 2016-12-26 DIAGNOSIS — E119 Type 2 diabetes mellitus without complications: Secondary | ICD-10-CM | POA: Diagnosis not present

## 2016-12-26 DIAGNOSIS — I10 Essential (primary) hypertension: Secondary | ICD-10-CM | POA: Diagnosis not present

## 2016-12-26 DIAGNOSIS — Z1212 Encounter for screening for malignant neoplasm of rectum: Secondary | ICD-10-CM

## 2016-12-26 MED ORDER — SODIUM CHLORIDE 0.9 % IV SOLN: 500.0000 mL | INTRAVENOUS | Status: DC

## 2016-12-26 NOTE — Progress Notes (Signed)
Called to room to assist during endoscopic procedure.  Patient ID and intended procedure confirmed with present staff. Received instructions for my participation in the procedure from the performing physician.  

## 2016-12-26 NOTE — Patient Instructions (Signed)
YOU HAD AN ENDOSCOPIC PROCEDURE TODAY AT [facility]:   Refer to the procedure report that was given to you for any specific questions about what was found during the examination.  If the procedure report does not answer your questions, please call your gastroenterologist to clarify.  If you requested that your care partner not be given the details of your procedure findings, then the procedure report has been included in a sealed envelope for you to review at your convenience later.  YOU SHOULD EXPECT: Some feelings of bloating in the abdomen. Passage of more gas than usual.  Walking can help get rid of the air that was put into your GI tract during the procedure and reduce the bloating. If you had a lower endoscopy (such as a colonoscopy or flexible sigmoidoscopy) you may notice spotting of blood in your stool or on the toilet paper. If you underwent a bowel prep for your procedure, you may not have a normal bowel movement for a few days.  Please Note:  You might notice some irritation and congestion in your nose or some drainage.  This is from the oxygen used during your procedure.  There is no need for concern and it should clear up in a day or so.  SYMPTOMS TO REPORT IMMEDIATELY:   Following lower endoscopy (colonoscopy or flexible sigmoidoscopy):  Excessive amounts of blood in the stool  Significant tenderness or worsening of abdominal pains  Swelling of the abdomen that is new, acute  Fever of 100F or higher   Following upper endoscopy (EGD)  Vomiting of blood or coffee ground material  New chest pain or pain under the shoulder blades  Painful or persistently difficult swallowing  New shortness of breath  Fever of 100F or higher  Black, tarry-looking stools  For urgent or emergent issues, a gastroenterologist can be reached at any hour by calling [phone].   DIET:  We do recommend a small meal at first, but then you may proceed to your regular diet.  Drink  plenty of fluids but you should avoid alcoholic beverages for 24 hours.  ACTIVITY:  You should plan to take it easy for the rest of today and you should NOT DRIVE or use heavy machinery until tomorrow (because of the sedation medicines used during the test).    FOLLOW UP: Our staff will call the number listed on your records the next business day following your procedure to check on you and address any questions or concerns that you may have regarding the information given to you following your procedure. If we do not reach you, we will leave a message.  However, if you are feeling well and you are not experiencing any problems, there is no need to return our call.  We will assume that you have returned to your regular daily activities without incident.  If any biopsies were taken you will be contacted by phone or by letter within the next 1-3 weeks.  Please call us at [phone] if you have not heard about the biopsies in 3 weeks.    SIGNATURES/CONFIDENTIALITY: You and/or your care partner have signed paperwork which will be entered into your electronic medical record.  These signatures attest to the fact that that the information above on your After Visit Summary has been reviewed and is understood.  Full responsibility of the confidentiality of this discharge information lies with you and/or your care-partner.  Hemorrhoid, diverticulosis, high fiber diet and polyp information given.

## 2016-12-26 NOTE — Op Note (Signed)
[facility] Patient Name: Kenneth [name] Procedure Date: 12/26/2016 10:24 AM MRN: [id number] Endoscopist: Mallie Mussel L. Loletha Carrow , MD Age: 56 Referring MD:  Date of Birth: February 03, 1961 Gender: Male Account #: [id number] Procedure:                Colonoscopy Indications:              Screening for colorectal malignant neoplasm, This                            is the patient's first colonoscopy Medicines:                Monitored Anesthesia Care Procedure:                Pre-Anesthesia Assessment:                           - Prior to the procedure, a History and Physical                            was performed, and patient medications and                            allergies were reviewed. The patient's tolerance of                            previous anesthesia was also reviewed. The risks                            and benefits of the procedure and the sedation                            options and risks were discussed with the patient.                            All questions were answered, and informed consent                            was obtained. Prior Anticoagulants: The patient has                            taken no previous anticoagulant or antiplatelet                            agents. ASA Grade Assessment: III - A patient with                            severe systemic disease. After reviewing the risks                            and benefits, the patient was deemed in                            satisfactory condition to undergo the procedure.  After obtaining informed consent, the colonoscope                            was passed under direct vision. Throughout the                            procedure, the patient's blood pressure, pulse, and                            oxygen saturations were monitored continuously. The                            Model CF-HQ190L [id number]) scope was introduced                            through the anus and  advanced to the the cecum,                            identified by appendiceal orifice and ileocecal                            valve. The colonoscopy was performed without                            difficulty. The patient tolerated the procedure                            well. The quality of the bowel preparation was                            excellent. The ileocecal valve, appendiceal                            orifice, and rectum were photographed. The quality                            of the bowel preparation was evaluated using the                            BBPS [facility] Bowel Preparation Scale) with scores                            of: Right Colon = 2, Transverse Colon = 3 and Left                            Colon = 3. The total BBPS score equals 8. The bowel                            preparation used was SUPREP. Scope In: 10:39:40 AM Scope Out: 10:55:17 AM Scope Withdrawal Time: 0 hours 11 minutes 53 seconds  Total Procedure Duration: 0 hours 15 minutes 37 seconds  Findings:                 The perianal  and digital rectal examinations were                            normal.                           Three sessile polyps were found in the rectum,                            proximal descending colon and ascending colon. The                            polyps were 2 mm in size. These polyps were removed                            with a piecemeal technique using a cold biopsy                            forceps. Resection and retrieval were complete.                           Multiple small-mouthed diverticula were found in                            the left colon.                           Internal hemorrhoids were found during retroflexion                            and during anoscopy. The hemorrhoids were small and                            Grade I (internal hemorrhoids that do not prolapse).                           The exam was otherwise without abnormality on                             direct and retroflexion views. Complications:            No immediate complications. Estimated Blood Loss:     Estimated blood loss: none. Impression:               - Three 2 mm polyps in the rectum, in the proximal                            descending colon and in the ascending colon,                            removed piecemeal using a cold biopsy forceps.                            Resected and retrieved.                           -  Diverticulosis in the left colon.                           - Internal hemorrhoids.                           - The examination was otherwise normal on direct                            and retroflexion views. Recommendation:           - Patient has a contact number available for                            emergencies. The signs and symptoms of potential                            delayed complications were discussed with the                            patient. Return to normal activities tomorrow.                            Written discharge instructions were provided to the                            patient.                           - Resume previous diet.                           - Continue present medications.                           - Await pathology results.                           - Repeat colonoscopy is recommended for                            surveillance. The colonoscopy date will be                            determined after pathology results from today's                            exam become available for review. [name] L. Loletha Carrow, MD 12/26/2016 11:01:51 AM This report has been signed electronically.

## 2016-12-26 NOTE — Progress Notes (Signed)
Patient awakening,vss,report to rn 

## 2016-12-27 ENCOUNTER — Telehealth: Payer: Self-pay

## 2016-12-27 NOTE — Telephone Encounter (Signed)
  Follow up Call-  Call back number 12/26/2016  Post procedure Call Back phone  # [phone]  Permission to leave phone message Yes  Some recent data might be hidden     Patient questions:  Do you have a fever, pain , or abdominal swelling? No. Pain Score  0 *  Have you tolerated food without any problems? Yes.    Have you been able to return to your normal activities? Yes.    Do you have any questions about your discharge instructions: Diet   No. Medications  No. Follow up visit  No.  Do you have questions or concerns about your Care? No.  Actions: * If pain score is 4 or above: No action needed, pain <4.

## 2016-12-31 ENCOUNTER — Encounter: Payer: Self-pay | Admitting: Gastroenterology

## 2017-01-05 ENCOUNTER — Other Ambulatory Visit: Payer: Self-pay | Admitting: Internal Medicine

## 2017-01-05 NOTE — Telephone Encounter (Signed)
Pt is requesting refill. Saw that pt last OV was 11/19/2016 and you increased the Norvasc to 10 mg daily but didn't see a note about the Lisinopril. Is it okay to refill. Please advise.

## 2017-01-08 NOTE — Telephone Encounter (Signed)
Okay to refill? 

## 2017-01-11 ENCOUNTER — Other Ambulatory Visit: Payer: Self-pay | Admitting: Internal Medicine

## 2017-02-05 ENCOUNTER — Other Ambulatory Visit: Payer: Self-pay | Admitting: Internal Medicine

## 2017-02-05 MED ORDER — GLIMEPIRIDE 2 MG PO TABS: ORAL_TABLET | ORAL | 1 refills | Status: DC

## 2017-02-05 MED ORDER — ATORVASTATIN CALCIUM 20 MG PO TABS: 20.0000 mg | ORAL_TABLET | Freq: Every day | ORAL | 2 refills | Status: DC

## 2017-02-05 MED ORDER — METFORMIN HCL 1000 MG PO TABS: ORAL_TABLET | ORAL | 1 refills | Status: DC

## 2017-03-21 ENCOUNTER — Other Ambulatory Visit: Payer: 59

## 2017-03-26 ENCOUNTER — Encounter: Payer: 59 | Admitting: Internal Medicine

## 2017-03-28 ENCOUNTER — Encounter: Payer: Self-pay | Admitting: Internal Medicine

## 2017-03-28 ENCOUNTER — Ambulatory Visit (INDEPENDENT_AMBULATORY_CARE_PROVIDER_SITE_OTHER): Payer: 59 | Admitting: Internal Medicine

## 2017-03-28 VITALS — BP 138/82 | HR 90 | Temp 98.4°F | Ht 63.0 in | Wt 204.2 lb

## 2017-03-28 DIAGNOSIS — E785 Hyperlipidemia, unspecified: Secondary | ICD-10-CM

## 2017-03-28 DIAGNOSIS — C7800 Secondary malignant neoplasm of unspecified lung: Secondary | ICD-10-CM

## 2017-03-28 DIAGNOSIS — I10 Essential (primary) hypertension: Secondary | ICD-10-CM | POA: Diagnosis not present

## 2017-03-28 DIAGNOSIS — Z860100 Personal history of colon polyps, unspecified: Secondary | ICD-10-CM | POA: Insufficient documentation

## 2017-03-28 DIAGNOSIS — E119 Type 2 diabetes mellitus without complications: Secondary | ICD-10-CM | POA: Diagnosis not present

## 2017-03-28 LAB — CBC WITH DIFFERENTIAL/PLATELET
BASOS PCT: 0.5 % (ref 0.0–3.0)
Basophils Absolute: 0 10*3/uL (ref 0.0–0.1)
EOS ABS: 0.2 10*3/uL (ref 0.0–0.7)
Eosinophils Relative: 2.9 % (ref 0.0–5.0)
HCT: 45.3 % (ref 39.0–52.0)
HEMOGLOBIN: 15.7 g/dL (ref 13.0–17.0)
LYMPHS ABS: 1.7 10*3/uL (ref 0.7–4.0)
Lymphocytes Relative: 26.3 % (ref 12.0–46.0)
MCHC: 34.8 g/dL (ref 30.0–36.0)
MCV: 91.2 fl (ref 78.0–100.0)
MONO ABS: 0.8 10*3/uL (ref 0.1–1.0)
Monocytes Relative: 12.8 % — ABNORMAL HIGH (ref 3.0–12.0)
NEUTROS PCT: 57.5 % (ref 43.0–77.0)
Neutro Abs: 3.8 10*3/uL (ref 1.4–7.7)
Platelets: 316 10*3/uL (ref 150.0–400.0)
RBC: 4.97 Mil/uL (ref 4.22–5.81)
RDW: 13.1 % (ref 11.5–15.5)
WBC: 6.6 10*3/uL (ref 4.0–10.5)

## 2017-03-28 LAB — LIPID PANEL
CHOLESTEROL: 144 mg/dL (ref 0–200)
HDL: 61.6 mg/dL (ref >39.00)
LDL CALC: 47 mg/dL (ref 0–99)
NonHDL: 81.91
TRIGLYCERIDES: 173 mg/dL — AB (ref 0.0–149.0)
Total CHOL/HDL Ratio: 2
VLDL: 34.6 mg/dL (ref 0.0–40.0)

## 2017-03-28 LAB — COMPREHENSIVE METABOLIC PANEL
ALBUMIN: 4.5 g/dL (ref 3.5–5.2)
ALT: 16 U/L (ref 0–53)
AST: 17 U/L (ref 0–37)
Alkaline Phosphatase: 35 U/L — ABNORMAL LOW (ref 39–117)
BILIRUBIN TOTAL: 0.4 mg/dL (ref 0.2–1.2)
BUN: 7 mg/dL (ref 6–23)
CALCIUM: 10 mg/dL (ref 8.4–10.5)
CHLORIDE: 90 meq/L — AB (ref 96–112)
CO2: 28 mEq/L (ref 19–32)
CREATININE: 0.93 mg/dL (ref 0.40–1.50)
GFR: 89.32 mL/min (ref >60.00)
Glucose, Bld: 63 mg/dL — ABNORMAL LOW (ref 70–99)
POTASSIUM: 3.3 meq/L — AB (ref 3.5–5.1)
Sodium: 133 mEq/L — ABNORMAL LOW (ref 135–145)
Total Protein: 7.1 g/dL (ref 6.0–8.3)

## 2017-03-28 LAB — TSH: TSH: 0.57 u[IU]/mL (ref 0.35–4.50)

## 2017-03-28 LAB — HEMOGLOBIN A1C: HEMOGLOBIN A1C: 6.1 % (ref 4.6–6.5)

## 2017-03-28 NOTE — Progress Notes (Signed)
Subjective:    Patient ID: Kenneth [name], male    DOB: [birth year], 56 y.o.   MRN: [id number]  HPI  56 year old patient who is seen today for a annual exam and Medicare wellness visit He has a history of metastatic melanoma and continues to be followed annually at [facility], which includes the imaging studies He has type 2 diabetes which has been stable.  He has hypertension and dyslipidemia, and exogenous obesity. He did have his first colonoscopy a few months ago that did reveal colonic polyps  Doing well without concerns or complaints  Past Medical History:  Diagnosis Date  . Cancer [facility])    metastatic melanoma- spread to lungs, two "spots" on brain per xray- last chemo tx in 2014, September- "all clear", no tx now  . Diabetes mellitus without complication ([place])   . Hyperlipidemia   . Hypertension      Social History   Social History  . Marital status: Single    Spouse name: N/A  . Number of children: N/A  . Years of education: N/A   Occupational History  . Not on file.   Social History Main Topics  . Smoking status: Never Smoker  . Smokeless tobacco: Current User    Types: Snuff  . Alcohol use No  . Drug use: No  . Sexual activity: Not on file   Other Topics Concern  . Not on file   Social History Narrative  . No narrative on file    Past Surgical History:  Procedure Laterality Date  . BRAIN SURGERY  2015   to check for possible malignancy from melanoma, result were scar tissue  . melanoma exicison     right leg    Family History  Problem Relation Age of Onset  . Prostate cancer Father   . Stomach cancer Paternal Grandmother   . Colon cancer Neg Hx   . Esophageal cancer Neg Hx   . Rectal cancer Neg Hx     Allergies  Allergen Reactions  . Cat Hair Extract Shortness Of Breath and Swelling    Swelling, watery eyes    Current Outpatient Prescriptions on File Prior to Visit  Medication Sig Dispense Refill  . amLODipine (NORVASC) 10 MG tablet Take 1  tablet (10 mg total) by mouth daily. 90 tablet 4  . atorvastatin (LIPITOR) 20 MG tablet Take 1 tablet (20 mg total) by mouth daily. 90 tablet 2  . glimepiride (AMARYL) 2 MG tablet TAKE 1/2 TO 1 TABLET BY MOUTH EVERY DAY WITH BREAKFAST 90 tablet 1  . lisinopril-hydrochlorothiazide (PRINZIDE,ZESTORETIC) 20-12.5 MG tablet TAKE 2 TABLETS BY MOUTH EVERY DAY 180 tablet 1  . lisinopril-hydrochlorothiazide (PRINZIDE,ZESTORETIC) 20-12.5 MG tablet TAKE 2 TABLETS BY MOUTH EVERY DAY 180 tablet 1  . metFORMIN (GLUCOPHAGE) 1000 MG tablet TAKE 1 TABLET BY MOUTH TWICE A DAY WITH A MEAL 180 tablet 1  . metoprolol tartrate (LOPRESSOR) 50 MG tablet Take 1 tablet (50 mg total) by mouth 2 (two) times daily. 60 tablet 5  . MULTIPLE VITAMIN PO Take 1 tablet by mouth daily.    . Na Sulfate-K Sulfate-Mg Sulf (SUPREP BOWEL PREP KIT) 17.5-3.13-1.6 GM/180ML SOLN Suprep as directed, no substitutions 354 mL 0  . naproxen sodium (ALEVE) 220 MG tablet Take 220 mg by mouth as needed.     . pioglitazone (ACTOS) 45 MG tablet TAKE 1 TABLET BY MOUTH EVERY DAY 90 tablet 3   Current Facility-Administered Medications on File Prior to Visit  Medication Dose Route Frequency Provider  Last Rate Last Dose  . 0.9 %  sodium chloride infusion  500 mL Intravenous Continuous Nelida Meuse III, MD        BP 138/82 (BP Location: Left Arm, Patient Position: Sitting, Cuff Size: Normal)   Pulse 90   Temp 98.4 F (36.9 C) (Oral)   Ht 5' 3"  (1.6 m)   Wt 204 lb 3.2 oz (92.6 kg)   SpO2 95%   BMI 36.17 kg/m   Medicare wellness visit  1. Risk factors, based on past  M,S,F history.  Cardiovascular risk factors include hypertension, diabetes and dyslipidemia  2.  Physical activities:fairly sedentary but no exercise restrictions  3.  Depression/mood:no history of major depression or mood disorder  4.  Hearing:no deficits  5.  ADL's:independent  6.  Fall risk:low  7.  Home safety:no problems identified  8.  Height weight, and visual  acuity;height and weight stable no change in visual acuity is overdue for an annual eye examination.  9.  Counseling:annual eye examination encouraged.  Weight loss more rigorous activity recommended  10. Lab orders based on risk factors:laboratory studies including hemoglobin A1c and lipid profile will be reviewed  11. Referral :follow-up oncology and dermatology12. Care plan:continue efforts at aggressive risk factor modification and weight loss  13. Cognitive assessment: alert and oriented with normal affect no cognitive dysfunction 14. Screening: Patient provided with a written and personalized 5-10 year screening schedule in the AVS.    15. Provider List Update: primary care oncology GI   Review of Systems  Constitutional: Negative for appetite change, chills, fatigue and fever.  HENT: Negative for congestion, dental problem, ear pain, hearing loss, sore throat, tinnitus, trouble swallowing and voice change.   Eyes: Negative for pain, discharge and visual disturbance.  Respiratory: Negative for cough, chest tightness, wheezing and stridor.   Cardiovascular: Negative for chest pain, palpitations and leg swelling.  Gastrointestinal: Negative for abdominal distention, abdominal pain, blood in stool, constipation, diarrhea, nausea and vomiting.  Genitourinary: Negative for difficulty urinating, discharge, flank pain, genital sores, hematuria and urgency.  Musculoskeletal: Negative for arthralgias, back pain, gait problem, joint swelling, myalgias and neck stiffness.  Skin: Negative for rash.  Neurological: Negative for dizziness, syncope, speech difficulty, weakness, numbness and headaches.  Hematological: Negative for adenopathy. Does not bruise/bleed easily.  Psychiatric/Behavioral: Negative for behavioral problems and dysphoric mood. The patient is not nervous/anxious.        Objective:   Physical Exam  Constitutional: He appears well-developed and well-nourished.  HENT:  Head:  Normocephalic and atraumatic.  Right Ear: External ear normal.  Left Ear: External ear normal.  Nose: Nose normal.  Mouth/Throat: Oropharynx is clear and moist.  Eyes: Conjunctivae and EOM are normal. Pupils are equal, round, and reactive to light. No scleral icterus.  Neck: Normal range of motion. Neck supple. No JVD present. No thyromegaly present.  Cardiovascular: Regular rhythm, normal heart sounds and intact distal pulses.  Exam reveals no gallop and no friction rub.   No murmur heard. Pulmonary/Chest: Effort normal and breath sounds normal. He exhibits no tenderness.  Abdominal: Soft. Bowel sounds are normal. He exhibits no distension and no mass. There is no tenderness.  Genitourinary: Penis normal.  Musculoskeletal: Normal range of motion. He exhibits no edema or tenderness.  Lymphadenopathy:    He has no cervical adenopathy.  Neurological: He is alert. He has normal reflexes. No cranial nerve deficit. Coordination normal.  Skin: Skin is warm and dry. No rash noted.  1 cm slightly ulcerated  papular lesion involving the right upper chest wall area consistent with a BCE  Psychiatric: He has a normal mood and affect. His behavior is normal.          Assessment & Plan:   Diabetes mellitus.  This has been well-controlled.  Check hemoglobin A1c Essential hypertension, stable Dyslipidemia.  Continue statin therapy Obesity.  Weight loss encouraged Colonic polyps.  Follow-up colonoscopy in 5 years Metastatic melanoma.  Follow-up oncology Probable basal cell skin cancer, right upper anterior chest wall.  Follow-up dermatology Medicare wellness visit    Needs annual eye examination  Return here in 6 months for follow-up  Kenneth [name]

## 2017-03-28 NOTE — Progress Notes (Signed)
Pre visit review using our clinic review tool, if applicable. No additional management support is needed unless otherwise documented below in the visit note. 

## 2017-03-28 NOTE — Patient Instructions (Addendum)
WE NOW OFFER   [facility] FAST TRACK!!!  SAME DAY Appointments for ACUTE CARE  Such as: Sprains, Injuries, cuts, abrasions, rashes, muscle pain, joint pain, back pain Colds, flu, sore throats, headache, allergies, cough, fever  Ear pain, sinus and eye infections Abdominal pain, nausea, vomiting, diarrhea, upset stomach Animal/insect bites  3 Easy Ways to Schedule: Walk-In Scheduling Call in scheduling Mychart Sign-up: [email or web address]   Limit your sodium (Salt) intake  You need to lose weight.  Consider a lower calorie diet and regular exercise.    It is important that you exercise regularly, at least 20 minutes 3 to 4 times per week.  If you develop chest pain or shortness of breath seek  medical attention.   Please check your hemoglobin A1c every 3-6  Months  Follow-up oncology  Follow-up dermatology   Please see your eye doctor yearly to check for diabetic eye damage

## 2017-08-23 ENCOUNTER — Encounter: Payer: Self-pay | Admitting: Internal Medicine

## 2017-09-24 ENCOUNTER — Encounter: Payer: Self-pay | Admitting: Internal Medicine

## 2017-09-24 ENCOUNTER — Ambulatory Visit (INDEPENDENT_AMBULATORY_CARE_PROVIDER_SITE_OTHER): Payer: 59 | Admitting: Internal Medicine

## 2017-09-24 VITALS — BP 180/90 | HR 117 | Temp 98.1°F | Ht 63.0 in | Wt 201.2 lb

## 2017-09-24 DIAGNOSIS — E119 Type 2 diabetes mellitus without complications: Secondary | ICD-10-CM

## 2017-09-24 DIAGNOSIS — I10 Essential (primary) hypertension: Secondary | ICD-10-CM | POA: Diagnosis not present

## 2017-09-24 DIAGNOSIS — E785 Hyperlipidemia, unspecified: Secondary | ICD-10-CM | POA: Diagnosis not present

## 2017-09-24 LAB — HEMOGLOBIN A1C: Hgb A1c MFr Bld: 8 % — ABNORMAL HIGH (ref 4.6–6.5)

## 2017-09-24 MED ORDER — METOPROLOL TARTRATE 100 MG PO TABS: 100.0000 mg | ORAL_TABLET | Freq: Two times a day (BID) | ORAL | 3 refills | Status: DC

## 2017-09-24 NOTE — Progress Notes (Signed)
Subjective:    Patient ID: Kenneth [name], male    DOB: 10/20/1961, 56 y.o.   MRN: [id number]  HPI  Lab Results  Component Value Date   HGBA1C 6.1 03/28/2017   BP Readings from Last 3 Encounters:  09/24/17 (!) 180/90  03/28/17 138/82  12/26/16 (!) 144/85   Wt Readings from Last 3 Encounters:  09/24/17 201 lb 3.2 oz (91.3 kg)  03/28/17 204 lb 3.2 oz (92.6 kg)  12/26/16 204 lb (35.45 kg)   56 year old patient who is seen today for his biannual follow-up. He has type 2 diabetes which has been very well controlled.  He states blood sugars at home have been under nice control  He has essential hypertension and states that he has been compliant with his medications.  No medications today, but did take all his medications faithfully yesterday  He is trying to get scheduled for follow-up an [facility] for metastatic melanoma.  He generally feels well  Past Medical History:  Diagnosis Date  . Cancer [facility])    metastatic melanoma- spread to lungs, two "spots" on brain per xray- last chemo tx in 2014, September- "all clear", no tx now  . Diabetes mellitus without complication ([place])   . Hyperlipidemia   . Hypertension      Social History   Social History  . Marital status: Single    Spouse name: N/A  . Number of children: N/A  . Years of education: N/A   Occupational History  . Not on file.   Social History Main Topics  . Smoking status: Never Smoker  . Smokeless tobacco: Current User    Types: Snuff  . Alcohol use No  . Drug use: No  . Sexual activity: Not on file   Other Topics Concern  . Not on file   Social History Narrative  . No narrative on file    Past Surgical History:  Procedure Laterality Date  . BRAIN SURGERY  2015   to check for possible malignancy from melanoma, result were scar tissue  . melanoma exicison     right leg    Family History  Problem Relation Age of Onset  . Prostate cancer Father   . Stomach cancer Paternal Grandmother   .  Colon cancer Neg Hx   . Esophageal cancer Neg Hx   . Rectal cancer Neg Hx     Allergies  Allergen Reactions  . Cat Hair Extract Shortness Of Breath and Swelling    Swelling, watery eyes    Current Outpatient Prescriptions on File Prior to Visit  Medication Sig Dispense Refill  . amLODipine (NORVASC) 10 MG tablet Take 1 tablet (10 mg total) by mouth daily. 90 tablet 4  . atorvastatin (LIPITOR) 20 MG tablet Take 1 tablet (20 mg total) by mouth daily. 90 tablet 2  . glimepiride (AMARYL) 2 MG tablet TAKE 1/2 TO 1 TABLET BY MOUTH EVERY DAY WITH BREAKFAST 90 tablet 1  . lisinopril-hydrochlorothiazide (PRINZIDE,ZESTORETIC) 20-12.5 MG tablet TAKE 2 TABLETS BY MOUTH EVERY DAY 180 tablet 1  . lisinopril-hydrochlorothiazide (PRINZIDE,ZESTORETIC) 20-12.5 MG tablet TAKE 2 TABLETS BY MOUTH EVERY DAY 180 tablet 1  . metFORMIN (GLUCOPHAGE) 1000 MG tablet TAKE 1 TABLET BY MOUTH TWICE A DAY WITH A MEAL 180 tablet 1  . metoprolol tartrate (LOPRESSOR) 50 MG tablet Take 1 tablet (50 mg total) by mouth 2 (two) times daily. 60 tablet 5  . MULTIPLE VITAMIN PO Take 1 tablet by mouth daily.    . naproxen sodium (  ALEVE) 220 MG tablet Take 220 mg by mouth as needed.     . pioglitazone (ACTOS) 45 MG tablet TAKE 1 TABLET BY MOUTH EVERY DAY 90 tablet 3   Current Facility-Administered Medications on File Prior to Visit  Medication Dose Route Frequency Provider Last Rate Last Dose  . 0.9 %  sodium chloride infusion  500 mL Intravenous Continuous Danis, Estill Cotta III, MD        BP (!) 180/90 (BP Location: Left Arm, Patient Position: Sitting, Cuff Size: Normal)   Pulse (!) 117   Temp 98.1 F (36.7 C) (Oral)   Ht 5\' 3"  (1.6 m)   Wt 201 lb 3.2 oz (91.3 kg)   SpO2 95%   BMI 35.64 kg/m    Review of Systems  Constitutional: Negative for appetite change, chills, fatigue and fever.  HENT: Negative for congestion, dental problem, ear pain, hearing loss, sore throat, tinnitus, trouble swallowing and voice change.     Eyes: Negative for pain, discharge and visual disturbance.  Respiratory: Negative for cough, chest tightness, wheezing and stridor.   Cardiovascular: Negative for chest pain, palpitations and leg swelling.  Gastrointestinal: Negative for abdominal distention, abdominal pain, blood in stool, constipation, diarrhea, nausea and vomiting.  Genitourinary: Negative for difficulty urinating, discharge, flank pain, genital sores, hematuria and urgency.  Musculoskeletal: Negative for arthralgias, back pain, gait problem, joint swelling, myalgias and neck stiffness.  Skin: Negative for rash.  Neurological: Negative for dizziness, syncope, speech difficulty, weakness, numbness and headaches.  Hematological: Negative for adenopathy. Does not bruise/bleed easily.  Psychiatric/Behavioral: Negative for behavioral problems and dysphoric mood. The patient is not nervous/anxious.        Objective:   Physical Exam  Constitutional: He is oriented to person, place, and time. He appears well-developed.  Weight 201 Blood pressure 160/90 Pulse 110  HENT:  Head: Normocephalic.  Right Ear: External ear normal.  Left Ear: External ear normal.  Eyes: Conjunctivae and EOM are normal.  Neck: Normal range of motion.  Cardiovascular: Normal rate and normal heart sounds.   Pulmonary/Chest: Breath sounds normal.  Abdominal: Bowel sounds are normal.  Musculoskeletal: Normal range of motion. He exhibits no edema or tenderness.  Neurological: He is alert and oriented to person, place, and time.  Psychiatric: He has a normal mood and affect. His behavior is normal.          Assessment & Plan:   Hypertension, poor control.  Will increase metoprolol to 100 mg twice a day.  Recheck 4 weeks.  Nonpharmacologic measures discussed History metastatic melanoma.  Follow-up [place] Diabetes mellitus.  Check a hemoglobin A1c  Flu vaccine administered  [name]

## 2017-09-24 NOTE — Patient Instructions (Signed)
Limit your sodium (Salt) intake  Please check your blood pressure on a regular basis.   RETURN IN 4 WEEKS FOR FOLLOW-UP  Increase metoprolol to 100 mg twice daily    It is important that you exercise regularly, at least 20 minutes 3 to 4 times per week.  If you develop chest pain or shortness of breath seek  medical attention.  You need to lose weight.  Consider a lower calorie diet and regular exercise.

## 2017-09-25 ENCOUNTER — Encounter: Payer: Self-pay | Admitting: Internal Medicine

## 2017-09-25 MED ORDER — GLIMEPIRIDE 4 MG PO TABS
4.0000 mg | ORAL_TABLET | Freq: Every day | ORAL | 1 refills | Status: DC
Start: 2017-09-25 — End: 2018-04-23

## 2017-09-25 NOTE — Addendum Note (Signed)
Addended by: Abelardo Diesel on: 09/25/2017 10:46 AM   Modules accepted: Orders

## 2017-10-22 ENCOUNTER — Ambulatory Visit: Payer: 59 | Admitting: Internal Medicine

## 2017-10-23 DIAGNOSIS — Z79899 Other long term (current) drug therapy: Secondary | ICD-10-CM | POA: Diagnosis not present

## 2017-10-23 DIAGNOSIS — C439 Malignant melanoma of skin, unspecified: Secondary | ICD-10-CM | POA: Diagnosis not present

## 2017-10-23 DIAGNOSIS — C7931 Secondary malignant neoplasm of brain: Secondary | ICD-10-CM | POA: Diagnosis not present

## 2017-10-23 DIAGNOSIS — Z08 Encounter for follow-up examination after completed treatment for malignant neoplasm: Secondary | ICD-10-CM | POA: Diagnosis not present

## 2017-10-23 DIAGNOSIS — Z923 Personal history of irradiation: Secondary | ICD-10-CM | POA: Diagnosis not present

## 2017-10-23 DIAGNOSIS — Z85841 Personal history of malignant neoplasm of brain: Secondary | ICD-10-CM | POA: Diagnosis not present

## 2017-10-23 DIAGNOSIS — R911 Solitary pulmonary nodule: Secondary | ICD-10-CM | POA: Diagnosis not present

## 2017-11-06 ENCOUNTER — Other Ambulatory Visit: Payer: Self-pay | Admitting: Internal Medicine

## 2017-11-06 DIAGNOSIS — Z08 Encounter for follow-up examination after completed treatment for malignant neoplasm: Secondary | ICD-10-CM | POA: Diagnosis not present

## 2017-11-06 DIAGNOSIS — C44519 Basal cell carcinoma of skin of other part of trunk: Secondary | ICD-10-CM | POA: Diagnosis not present

## 2017-11-06 DIAGNOSIS — Z85820 Personal history of malignant melanoma of skin: Secondary | ICD-10-CM | POA: Diagnosis not present

## 2017-11-06 DIAGNOSIS — Z1283 Encounter for screening for malignant neoplasm of skin: Secondary | ICD-10-CM | POA: Diagnosis not present

## 2017-11-06 DIAGNOSIS — B078 Other viral warts: Secondary | ICD-10-CM | POA: Diagnosis not present

## 2017-11-06 DIAGNOSIS — L905 Scar conditions and fibrosis of skin: Secondary | ICD-10-CM | POA: Diagnosis not present

## 2017-11-06 DIAGNOSIS — D485 Neoplasm of uncertain behavior of skin: Secondary | ICD-10-CM | POA: Diagnosis not present

## 2017-12-17 ENCOUNTER — Ambulatory Visit (INDEPENDENT_AMBULATORY_CARE_PROVIDER_SITE_OTHER): Payer: 59 | Admitting: Internal Medicine

## 2017-12-17 ENCOUNTER — Encounter: Payer: Self-pay | Admitting: Internal Medicine

## 2017-12-17 VITALS — BP 124/68 | HR 82 | Temp 98.3°F | Ht 63.0 in | Wt 205.0 lb

## 2017-12-17 DIAGNOSIS — E119 Type 2 diabetes mellitus without complications: Secondary | ICD-10-CM | POA: Diagnosis not present

## 2017-12-17 DIAGNOSIS — I10 Essential (primary) hypertension: Secondary | ICD-10-CM

## 2017-12-17 LAB — POCT GLYCOSYLATED HEMOGLOBIN (HGB A1C): Hemoglobin A1C: 5.7

## 2017-12-17 NOTE — Progress Notes (Signed)
Subjective:    Patient ID: Kenneth [name], male    DOB: 12-24-1960, 57 y.o.   MRN: [id number]  HPI  Wt Readings from Last 3 Encounters:  12/17/17 205 lb (93 kg)  09/24/17 201 lb 3.2 oz (91.3 kg)  03/28/17 204 lb 3.2 oz (38.74 kg)   57 year old patient who is seen today for follow-up of type 2 diabetes and essential hypertension. Glimepiride was increased last visit due to a rise in hemoglobin A1c from 6.1 to.  8.0 his weight is up. He has been seen in follow-up at [facility] and also has had a recent dermatologic evaluation.  No recent eye examination Clinically doing well without concerns or complaints  Past Medical History:  Diagnosis Date  . Cancer [facility])    metastatic melanoma- spread to lungs, two "spots" on brain per xray- last chemo tx in 2014, September- "all clear", no tx now  . Diabetes mellitus without complication ([place])   . Hyperlipidemia   . Hypertension      Social History   Socioeconomic History  . Marital status: Single    Spouse name: Not on file  . Number of children: Not on file  . Years of education: Not on file  . Highest education level: Not on file  Social Needs  . Financial resource strain: Not on file  . Food insecurity - worry: Not on file  . Food insecurity - inability: Not on file  . Transportation needs - medical: Not on file  . Transportation needs - non-medical: Not on file  Occupational History  . Not on file  Tobacco Use  . Smoking status: Never Smoker  . Smokeless tobacco: Current User    Types: Snuff  Substance and Sexual Activity  . Alcohol use: No  . Drug use: No  . Sexual activity: Not on file  Other Topics Concern  . Not on file  Social History Narrative  . Not on file    Past Surgical History:  Procedure Laterality Date  . BRAIN SURGERY  2015   to check for possible malignancy from melanoma, result were scar tissue  . melanoma exicison     right leg    Family History  Problem Relation Age of Onset  . Prostate  cancer Father   . Stomach cancer Paternal Grandmother   . Colon cancer Neg Hx   . Esophageal cancer Neg Hx   . Rectal cancer Neg Hx     Allergies  Allergen Reactions  . Cat Hair Extract Shortness Of Breath and Swelling    Swelling, watery eyes    Current Outpatient Medications on File Prior to Visit  Medication Sig Dispense Refill  . amLODipine (NORVASC) 10 MG tablet TAKE 1 TABLET BY MOUTH EVERY DAY 90 tablet 3  . atorvastatin (LIPITOR) 20 MG tablet Take 1 tablet (20 mg total) by mouth daily. 90 tablet 2  . glimepiride (AMARYL) 4 MG tablet Take 1 tablet (4 mg total) by mouth daily with breakfast. 90 tablet 1  . lisinopril-hydrochlorothiazide (PRINZIDE,ZESTORETIC) 20-12.5 MG tablet TAKE 2 TABLETS BY MOUTH EVERY DAY 180 tablet 1  . metFORMIN (GLUCOPHAGE) 1000 MG tablet TAKE 1 TABLET BY MOUTH TWICE A DAY WITH A MEAL 180 tablet 1  . metoprolol tartrate (LOPRESSOR) 100 MG tablet Take 1 tablet (100 mg total) by mouth 2 (two) times daily. 180 tablet 3  . MULTIPLE VITAMIN PO Take 1 tablet by mouth daily.    . naproxen sodium (ALEVE) 220 MG tablet Take 220 mg  by mouth as needed.     . pioglitazone (ACTOS) 45 MG tablet TAKE 1 TABLET BY MOUTH EVERY DAY 90 tablet 3   No current facility-administered medications on file prior to visit.     BP 124/68 (BP Location: Left Arm, Patient Position: Sitting, Cuff Size: Normal)   Pulse 82   Temp 98.3 F (36.8 C) (Oral)   Ht 5\' 3"  (1.6 m)   Wt 205 lb (93 kg)   SpO2 96%   BMI 36.31 kg/m     Review of Systems  Constitutional: Negative for appetite change, chills, fatigue and fever.  HENT: Negative for congestion, dental problem, ear pain, hearing loss, sore throat, tinnitus, trouble swallowing and voice change.   Eyes: Negative for pain, discharge and visual disturbance.  Respiratory: Negative for cough, chest tightness, wheezing and stridor.   Cardiovascular: Negative for chest pain, palpitations and leg swelling.  Gastrointestinal: Negative for  abdominal distention, abdominal pain, blood in stool, constipation, diarrhea, nausea and vomiting.  Genitourinary: Negative for difficulty urinating, discharge, flank pain, genital sores, hematuria and urgency.  Musculoskeletal: Negative for arthralgias, back pain, gait problem, joint swelling, myalgias and neck stiffness.  Skin: Negative for rash.  Neurological: Negative for dizziness, syncope, speech difficulty, weakness, numbness and headaches.  Hematological: Negative for adenopathy. Does not bruise/bleed easily.  Psychiatric/Behavioral: Negative for behavioral problems and dysphoric mood. The patient is not nervous/anxious.        Objective:   Physical Exam  Constitutional: He is oriented to person, place, and time. He appears well-developed.  HENT:  Head: Normocephalic.  Right Ear: External ear normal.  Left Ear: External ear normal.  Eyes: Conjunctivae and EOM are normal.  Neck: Normal range of motion.  Cardiovascular: Normal rate and normal heart sounds.  Pulmonary/Chest: Breath sounds normal.  Abdominal: Bowel sounds are normal.  Musculoskeletal: Normal range of motion. He exhibits no edema or tenderness.  Neurological: He is alert and oriented to person, place, and time.  Psychiatric: He has a normal mood and affect. His behavior is normal.          Assessment & Plan:   Essential hypertension well-controlled Diabetes mellitus.  Hemoglobin A1c 5.7.  Will decrease glimepiride to 2 mg daily Obesity.  Weight loss encouraged better dietary habits discussed History metastatic melanoma  Weight loss encouraged Follow-up 3-6 months  Kenneth [name] Pilar Plate

## 2017-12-17 NOTE — Patient Instructions (Signed)
Limit your sodium (Salt) intake  You need to lose weight.  Consider a lower calorie diet and regular exercise.  Please check your blood pressure on a regular basis.  If it is consistently greater than 140/90, please make an office appointment.    It is important that you exercise regularly, at least 20 minutes 3 to 4 times per week.  If you develop chest pain or shortness of breath seek  medical attention.

## 2017-12-19 ENCOUNTER — Other Ambulatory Visit: Payer: Self-pay | Admitting: Internal Medicine

## 2018-01-23 ENCOUNTER — Other Ambulatory Visit: Payer: Self-pay | Admitting: *Deleted

## 2018-01-23 MED ORDER — ATORVASTATIN CALCIUM 20 MG PO TABS: 20.0000 mg | ORAL_TABLET | Freq: Every day | ORAL | 2 refills | Status: DC

## 2018-01-28 ENCOUNTER — Other Ambulatory Visit: Payer: Self-pay | Admitting: Internal Medicine

## 2018-02-27 ENCOUNTER — Other Ambulatory Visit: Payer: Self-pay | Admitting: Internal Medicine

## 2018-04-15 ENCOUNTER — Ambulatory Visit: Payer: 59 | Admitting: Internal Medicine

## 2018-04-23 ENCOUNTER — Encounter: Payer: Self-pay | Admitting: Internal Medicine

## 2018-04-23 ENCOUNTER — Ambulatory Visit: Payer: 59 | Admitting: Internal Medicine

## 2018-04-23 VITALS — BP 116/68 | HR 69 | Temp 98.2°F | Wt 198.0 lb

## 2018-04-23 DIAGNOSIS — E119 Type 2 diabetes mellitus without complications: Secondary | ICD-10-CM | POA: Diagnosis not present

## 2018-04-23 DIAGNOSIS — I10 Essential (primary) hypertension: Secondary | ICD-10-CM | POA: Diagnosis not present

## 2018-04-23 DIAGNOSIS — E785 Hyperlipidemia, unspecified: Secondary | ICD-10-CM | POA: Diagnosis not present

## 2018-04-23 LAB — COMPREHENSIVE METABOLIC PANEL
ALBUMIN: 4.4 g/dL (ref 3.5–5.2)
ALT: 11 U/L (ref 0–53)
AST: 11 U/L (ref 0–37)
Alkaline Phosphatase: 37 U/L — ABNORMAL LOW (ref 39–117)
BUN: 7 mg/dL (ref 6–23)
CO2: 31 mEq/L (ref 19–32)
Calcium: 10 mg/dL (ref 8.4–10.5)
Chloride: 95 mEq/L — ABNORMAL LOW (ref 96–112)
Creatinine, Ser: 1.01 mg/dL (ref 0.40–1.50)
GFR: 80.89 mL/min (ref >60.00)
Glucose, Bld: 84 mg/dL (ref 70–99)
Potassium: 4.1 mEq/L (ref 3.5–5.1)
SODIUM: 138 meq/L (ref 135–145)
Total Bilirubin: 0.5 mg/dL (ref 0.2–1.2)
Total Protein: 7.1 g/dL (ref 6.0–8.3)

## 2018-04-23 LAB — LIPID PANEL
CHOLESTEROL: 135 mg/dL (ref 0–200)
HDL: 59.8 mg/dL (ref >39.00)
LDL CALC: 53 mg/dL (ref 0–99)
NONHDL: 75.37
Total CHOL/HDL Ratio: 2
Triglycerides: 112 mg/dL (ref 0.0–149.0)
VLDL: 22.4 mg/dL (ref 0.0–40.0)

## 2018-04-23 LAB — MICROALBUMIN / CREATININE URINE RATIO
Creatinine,U: 77.4 mg/dL
MICROALB UR: 4.7 mg/dL — AB (ref 0.0–1.9)
Microalb Creat Ratio: 6.1 mg/g (ref 0.0–30.0)

## 2018-04-23 LAB — CBC WITH DIFFERENTIAL/PLATELET
BASOS ABS: 0 10*3/uL (ref 0.0–0.1)
BASOS PCT: 0.6 % (ref 0.0–3.0)
EOS ABS: 0.2 10*3/uL (ref 0.0–0.7)
Eosinophils Relative: 2.4 % (ref 0.0–5.0)
HEMATOCRIT: 43.4 % (ref 39.0–52.0)
Hemoglobin: 15.3 g/dL (ref 13.0–17.0)
Lymphocytes Relative: 20.9 % (ref 12.0–46.0)
Lymphs Abs: 1.4 10*3/uL (ref 0.7–4.0)
MCHC: 35.2 g/dL (ref 30.0–36.0)
MCV: 91.4 fl (ref 78.0–100.0)
Monocytes Absolute: 1 10*3/uL (ref 0.1–1.0)
Monocytes Relative: 13.8 % — ABNORMAL HIGH (ref 3.0–12.0)
Neutro Abs: 4.3 10*3/uL (ref 1.4–7.7)
Neutrophils Relative %: 62.3 % (ref 43.0–77.0)
Platelets: 320 10*3/uL (ref 150.0–400.0)
RBC: 4.75 Mil/uL (ref 4.22–5.81)
RDW: 13.2 % (ref 11.5–15.5)
WBC: 6.9 10*3/uL (ref 4.0–10.5)

## 2018-04-23 LAB — TSH: TSH: 0.88 u[IU]/mL (ref 0.35–4.50)

## 2018-04-23 LAB — POCT GLYCOSYLATED HEMOGLOBIN (HGB A1C): HEMOGLOBIN A1C: 5.4 % (ref 4.0–5.6)

## 2018-04-23 NOTE — Patient Instructions (Signed)
Limit your sodium (Salt) intake  Please check your blood pressure on a regular basis.  If it is consistently greater than 140/90, please make an office appointment.    It is important that you exercise regularly, at least 20 minutes 3 to 4 times per week.  If you develop chest pain or shortness of breath seek  medical attention.  You need to lose weight.  Consider a lower calorie diet and regular exercise.  Please see your eye doctor yearly to check for diabetic eye damage

## 2018-04-23 NOTE — Progress Notes (Signed)
Subjective:    Patient ID: Kenneth [name], male    DOB: [birth year], 57 y.o.   MRN: [id number]  HPI 57 year old patient who is seen today for follow-up of type 2 diabetes.  He has essential hypertension and dyslipidemia. He has a history of metastatic melanoma and is followed by annually by dermatology and annually by oncology at [facility].  He continues to do well. Hemoglobin A1c remains in a nondiabetic range.  He has no symptoms to suggest hypoglycemia. He remains on statin therapy  No recent eye examination   Past Medical History:  Diagnosis Date  . Cancer [facility])    metastatic melanoma- spread to lungs, two "spots" on brain per xray- last chemo tx in 2014, September- "all clear", no tx now  . Diabetes mellitus without complication ([place])   . Hyperlipidemia   . Hypertension      Social History   Socioeconomic History  . Marital status: Single    Spouse name: Not on file  . Number of children: Not on file  . Years of education: Not on file  . Highest education level: Not on file  Occupational History  . Not on file  Social Needs  . Financial resource strain: Not on file  . Food insecurity:    Worry: Not on file    Inability: Not on file  . Transportation needs:    Medical: Not on file    Non-medical: Not on file  Tobacco Use  . Smoking status: Never Smoker  . Smokeless tobacco: Current User    Types: Snuff  Substance and Sexual Activity  . Alcohol use: No  . Drug use: No  . Sexual activity: Not on file  Lifestyle  . Physical activity:    Days per week: Not on file    Minutes per session: Not on file  . Stress: Not on file  Relationships  . Social connections:    Talks on phone: Not on file    Gets together: Not on file    Attends religious service: Not on file    Active member of club or organization: Not on file    Attends meetings of clubs or organizations: Not on file    Relationship status: Not on file  . Intimate partner violence:    Fear of  current or ex partner: Not on file    Emotionally abused: Not on file    Physically abused: Not on file    Forced sexual activity: Not on file  Other Topics Concern  . Not on file  Social History Narrative  . Not on file    Past Surgical History:  Procedure Laterality Date  . BRAIN SURGERY  2015   to check for possible malignancy from melanoma, result were scar tissue  . melanoma exicison     right leg    Family History  Problem Relation Age of Onset  . Prostate cancer Father   . Stomach cancer Paternal Grandmother   . Colon cancer Neg Hx   . Esophageal cancer Neg Hx   . Rectal cancer Neg Hx     Allergies  Allergen Reactions  . Cat Hair Extract Shortness Of Breath and Swelling    Swelling, watery eyes    Current Outpatient Medications on File Prior to Visit  Medication Sig Dispense Refill  . amLODipine (NORVASC) 10 MG tablet TAKE 1 TABLET BY MOUTH EVERY DAY 90 tablet 3  . atorvastatin (LIPITOR) 20 MG tablet Take 1 tablet (20 mg  total) by mouth daily. 90 tablet 2  . glimepiride (AMARYL) 2 MG tablet TAKE 1/2 TO 1 TABLET BY MOUTH EVERY DAY WITH BREAKFAST 90 tablet 1  . lisinopril-hydrochlorothiazide (PRINZIDE,ZESTORETIC) 20-12.5 MG tablet **05/24/2017**TAKE 2 TABLETS BY MOUTH EVERY DAY 180 tablet 0  . metFORMIN (GLUCOPHAGE) 1000 MG tablet TAKE 1 TABLET BY MOUTH TWICE A DAY WITH A MEAL 180 tablet 1  . metoprolol tartrate (LOPRESSOR) 100 MG tablet Take 1 tablet (100 mg total) by mouth 2 (two) times daily. 180 tablet 3  . MULTIPLE VITAMIN PO Take 1 tablet by mouth daily.    . naproxen sodium (ALEVE) 220 MG tablet Take 220 mg by mouth as needed.     . pioglitazone (ACTOS) 45 MG tablet TAKE 1 TABLET BY MOUTH EVERY DAY 90 tablet 2   No current facility-administered medications on file prior to visit.     BP 116/68 (BP Location: Right Arm, Patient Position: Sitting, Cuff Size: Large)   Pulse 69   Temp 98.2 F (36.8 C) (Oral)   Wt 198 lb (89.8 kg)   SpO2 96%   BMI 35.07  kg/m      Review of Systems  Constitutional: Negative for appetite change, chills, fatigue and fever.  HENT: Negative for congestion, dental problem, ear pain, hearing loss, sore throat, tinnitus, trouble swallowing and voice change.   Eyes: Negative for pain, discharge and visual disturbance.  Respiratory: Negative for cough, chest tightness, wheezing and stridor.   Cardiovascular: Negative for chest pain, palpitations and leg swelling.  Gastrointestinal: Negative for abdominal distention, abdominal pain, blood in stool, constipation, diarrhea, nausea and vomiting.  Genitourinary: Negative for difficulty urinating, discharge, flank pain, genital sores, hematuria and urgency.  Musculoskeletal: Negative for arthralgias, back pain, gait problem, joint swelling, myalgias and neck stiffness.  Skin: Negative for rash.  Neurological: Negative for dizziness, syncope, speech difficulty, weakness, numbness and headaches.  Hematological: Negative for adenopathy. Does not bruise/bleed easily.  Psychiatric/Behavioral: Negative for behavioral problems and dysphoric mood. The patient is not nervous/anxious.        Objective:   Physical Exam  Constitutional: He is oriented to person, place, and time. He appears well-developed.   Weight 198 blood pressure 100/60  HENT:  Head: Normocephalic.  Right Ear: External ear normal.  Left Ear: External ear normal.  Eyes: Conjunctivae and EOM are normal.  Neck: Normal range of motion.  Cardiovascular: Normal rate and normal heart sounds.  Pulmonary/Chest: Breath sounds normal.  Abdominal: Bowel sounds are normal.  Musculoskeletal: Normal range of motion. He exhibits no edema or tenderness.  Neurological: He is alert and oriented to person, place, and time.  Psychiatric: He has a normal mood and affect. His behavior is normal.          Assessment & Plan:   Diabetes mellitus.  Will check urine for microalbumin and lipid profile.  Needs annual eye  examination Dyslipidemia Hypertension well-controlled no change in therapy History of colonic polyps.  Continue 5-year interval colonoscopies  Return in 6 months for follow-up   [name]

## 2018-04-24 LAB — HEPATITIS C ANTIBODY
HEP C AB: NONREACTIVE
SIGNAL TO CUT-OFF: 0.01 (ref <1.00)

## 2018-05-22 ENCOUNTER — Other Ambulatory Visit: Payer: Self-pay | Admitting: Internal Medicine

## 2018-08-21 ENCOUNTER — Other Ambulatory Visit: Payer: Self-pay | Admitting: Internal Medicine

## 2018-08-23 ENCOUNTER — Other Ambulatory Visit: Payer: Self-pay | Admitting: Internal Medicine

## 2018-10-22 ENCOUNTER — Encounter: Payer: 59 | Admitting: Adult Health

## 2018-11-06 ENCOUNTER — Encounter: Payer: Self-pay | Admitting: Internal Medicine

## 2018-11-06 ENCOUNTER — Ambulatory Visit: Payer: 59 | Admitting: Internal Medicine

## 2018-11-06 VITALS — BP 110/80 | HR 104 | Temp 98.3°F | Wt 201.4 lb

## 2018-11-06 DIAGNOSIS — Z860100 Personal history of colon polyps, unspecified: Secondary | ICD-10-CM

## 2018-11-06 DIAGNOSIS — E119 Type 2 diabetes mellitus without complications: Secondary | ICD-10-CM | POA: Diagnosis not present

## 2018-11-06 DIAGNOSIS — E785 Hyperlipidemia, unspecified: Secondary | ICD-10-CM

## 2018-11-06 DIAGNOSIS — I10 Essential (primary) hypertension: Secondary | ICD-10-CM

## 2018-11-06 DIAGNOSIS — C7800 Secondary malignant neoplasm of unspecified lung: Secondary | ICD-10-CM

## 2018-11-06 LAB — POCT GLYCOSYLATED HEMOGLOBIN (HGB A1C): HEMOGLOBIN A1C: 5.7 % — AB (ref 4.0–5.6)

## 2018-11-06 NOTE — Patient Instructions (Addendum)
-  It was nice meeting you, keep up the good work!  -Please make sure you schedule your eye exam as soon as possible.  -Please schedule follow-up with me in 4 to 6 months.  Please come fasting to that visit as we will draw labs.  Contact us sooner if you have any acute issues.

## 2018-11-06 NOTE — Progress Notes (Signed)
Established Patient Office Visit     CC/Reason for Visit: To establish care, follow-up chronic medical conditions  HPI: Kenneth [name] is a 57 y.o. male who is coming in today for the above mentioned reasons.  He is past due for his annual physical exam. Past Medical History is significant for: Type 2 diabetes, hypertension, hyperlipidemia, metastatic melanoma that originated in the right thigh with metastases to brain and lung.  In regards to his melanoma he was seen in November at [facility] by both oncology and radiation oncology, his PET scans and MRIs have been clear, he is following up with them on an annual basis.  He also sees his dermatologist biannually.  At last visit had some suspicious lesions that were removed and pathology was positive for basal cell carcinoma.  His diabetes remains very well controlled as does his hypertension.  He has no acute complaints on today's visit.   Past Medical/Surgical History: Past Medical History:  Diagnosis Date  . Cancer [facility])    metastatic melanoma- spread to lungs, two "spots" on brain per xray- last chemo tx in 2014, September- "all clear", no tx now  . Diabetes mellitus without complication ([place])   . Hyperlipidemia   . Hypertension     Past Surgical History:  Procedure Laterality Date  . BRAIN SURGERY  2015   to check for possible malignancy from melanoma, result were scar tissue  . melanoma exicison     right leg    Social History:  reports that he has never smoked. His smokeless tobacco use includes snuff. He reports that he does not drink alcohol or use drugs.  Allergies: Allergies  Allergen Reactions  . Cat Hair Extract Shortness Of Breath and Swelling    Swelling, watery eyes    Family History:  Family History  Problem Relation Age of Onset  . Prostate cancer Father   . Stomach cancer Paternal Grandmother   . Colon cancer Neg Hx   . Esophageal cancer Neg Hx   . Rectal cancer Neg Hx      Current  Outpatient Medications:  .  amLODipine (NORVASC) 10 MG tablet, TAKE 1 TABLET BY MOUTH EVERY DAY, Disp: 90 tablet, Rfl: 3 .  atorvastatin (LIPITOR) 20 MG tablet, Take 1 tablet (20 mg total) by mouth daily., Disp: 90 tablet, Rfl: 2 .  glimepiride (AMARYL) 2 MG tablet, TAKE 1/2 TO 1 TABLET BY MOUTH EVERY DAY WITH BREAKFAST, Disp: 90 tablet, Rfl: 1 .  lisinopril-hydrochlorothiazide (PRINZIDE,ZESTORETIC) 20-12.5 MG tablet, TAKE 2 TABLETS BY MOUTH EVERY DAY, Disp: 180 tablet, Rfl: 0 .  metFORMIN (GLUCOPHAGE) 1000 MG tablet, TAKE 1 TABLET BY MOUTH TWICE A DAY WITH A MEAL, Disp: 180 tablet, Rfl: 1 .  metoprolol tartrate (LOPRESSOR) 100 MG tablet, Take 1 tablet (100 mg total) by mouth 2 (two) times daily., Disp: 180 tablet, Rfl: 3 .  MULTIPLE VITAMIN PO, Take 1 tablet by mouth daily., Disp: , Rfl:  .  naproxen sodium (ALEVE) 220 MG tablet, Take 220 mg by mouth as needed. , Disp: , Rfl:  .  pioglitazone (ACTOS) 45 MG tablet, TAKE 1 TABLET BY MOUTH EVERY DAY, Disp: 90 tablet, Rfl: 2  Review of Systems:  Constitutional: Denies fever, chills, diaphoresis, appetite change and fatigue.  HEENT: Denies photophobia, eye pain, redness, hearing loss, ear pain, congestion, sore throat, rhinorrhea, sneezing, mouth sores, trouble swallowing, neck pain, neck stiffness and tinnitus.   Respiratory: Denies SOB, DOE, cough, chest tightness,  and wheezing.  Cardiovascular: Denies chest pain, palpitations and leg swelling.  Gastrointestinal: Denies nausea, vomiting, abdominal pain, diarrhea, constipation, blood in stool and abdominal distention.  Genitourinary: Denies dysuria, urgency, frequency, hematuria, flank pain and difficulty urinating.  Endocrine: Denies: hot or cold intolerance, sweats, changes in hair or nails, polyuria, polydipsia. Musculoskeletal: Denies myalgias, back pain, joint swelling, arthralgias and gait problem.  Skin: Denies pallor, rash and wound.  Neurological: Denies dizziness, seizures, syncope,  weakness, light-headedness, numbness and headaches.  Hematological: Denies adenopathy. Easy bruising, personal or family bleeding history  Psychiatric/Behavioral: Denies suicidal ideation, mood changes, confusion, nervousness, sleep disturbance and agitation    Physical Exam: Vitals:   11/06/18 0713  BP: 110/80  Pulse: (!) 104  Temp: 98.3 F (36.8 C)  TempSrc: Oral  SpO2: 97%  Weight: 201 lb 6.4 oz (91.4 kg)    Body mass index is 35.68 kg/m.    Constitutional: NAD, calm, comfortable Eyes: PERRL, lids and conjunctivae normal ENMT: Mucous membranes are moist. Posterior pharynx clear of any exudate or lesions. Normal dentition.  Neck: normal, supple, no masses, no thyromegaly Respiratory: clear to auscultation bilaterally, no wheezing, no crackles. Normal respiratory effort. No accessory muscle use.  Cardiovascular: Regular rate and rhythm, no murmurs / rubs / gallops. No extremity edema. 2+ pedal pulses. No carotid bruits.  Abdomen: no tenderness, no masses palpated. No hepatosplenomegaly. Bowel sounds positive.  Musculoskeletal: no clubbing / cyanosis. No joint deformity upper and lower extremities. Good ROM, no contractures. Normal muscle tone.  Skin: no rashes, lesions, ulcers. No induration Neurologic: Grossly intact and nonfocal Psychiatric: Normal judgment and insight. Alert and oriented x 3. Normal mood.    Impression and Plan:  Diabetes mellitus without complication ([place])  Lab Results  Component Value Date   HGBA1C 5.7 (A) 11/06/2018   -A1c today demonstrates excellent control, continue current regimen of metformin, pioglitazone, Amaryl. -Needs to schedule eye exam.  Patient states he has had some financial difficulties in doing this.  Dyslipidemia -Continue atorvastatin 20. -LDL 53 in May 2019.  Essential hypertension -Excellent control. -Continue lisinopril/hydrochlorothiazide metoprolol  Secondary malignant melanoma of lung, unspecified laterality  ([place]) -Follows with dermatology, oncology, radiation oncology at [place] them annually, last visit in November 2019 with clear scans.  Morbid Obesity -Counseled on lifestyle modifications including healthier eating choices and increased physical activity.  Hx of colonic polyps -Is on a q. 5-year colonoscopy schedule.    Patient Instructions  -It was nice meeting you, keep up the good work!  -Please make sure you schedule your eye exam as soon as possible.  -Please schedule follow-up with me in 4 to 6 months.  Please come fasting to that visit as we will draw labs.  Contact us sooner if you have any acute issues.     Lelon Frohlich, MD [place] [facility]

## 2018-11-19 ENCOUNTER — Other Ambulatory Visit: Payer: Self-pay | Admitting: Internal Medicine

## 2018-11-23 ENCOUNTER — Other Ambulatory Visit: Payer: Self-pay | Admitting: Internal Medicine

## 2018-12-06 ENCOUNTER — Other Ambulatory Visit: Payer: Self-pay | Admitting: *Deleted

## 2018-12-06 MED ORDER — AMLODIPINE BESYLATE 10 MG PO TABS: 10.0000 mg | ORAL_TABLET | Freq: Every day | ORAL | 1 refills | Status: DC

## 2018-12-06 MED ORDER — PIOGLITAZONE HCL 45 MG PO TABS: 45.0000 mg | ORAL_TABLET | Freq: Every day | ORAL | 1 refills | Status: DC

## 2019-03-12 ENCOUNTER — Encounter: Payer: 59 | Admitting: Internal Medicine

## 2019-10-22 ENCOUNTER — Telehealth: Payer: Self-pay | Admitting: *Deleted

## 2019-10-22 ENCOUNTER — Other Ambulatory Visit (HOSPITAL_COMMUNITY): Payer: Self-pay | Admitting: Internal Medicine

## 2019-10-22 ENCOUNTER — Other Ambulatory Visit: Payer: Self-pay | Admitting: Internal Medicine

## 2019-10-22 DIAGNOSIS — C439 Malignant melanoma of skin, unspecified: Secondary | ICD-10-CM

## 2019-11-10 ENCOUNTER — Ambulatory Visit (HOSPITAL_COMMUNITY)
Admission: RE | Admit: 2019-11-10 | Discharge: 2019-11-10 | Disposition: A | Discharge status: Home / Self Care | Payer: 59 | Source: Ambulatory Visit | Attending: Internal Medicine | Admitting: Internal Medicine

## 2019-11-10 ENCOUNTER — Other Ambulatory Visit: Payer: Self-pay

## 2019-11-10 DIAGNOSIS — C439 Malignant melanoma of skin, unspecified: Secondary | ICD-10-CM | POA: Diagnosis present

## 2019-11-10 IMAGING — CT CT CHEST W/ CM
2 of 5 series · 14 of 46 positions shown, 16 images · IV contrast (omnipaque)
Comparison: None.

CLINICAL DATA: Metastatic melanoma of right lower extremity origin
7 years ago.

EXAM:
CT CHEST, ABDOMEN, AND PELVIS WITH CONTRAST
TECHNIQUE: Multidetector CT imaging of the chest, abdomen and pelvis was
performed following the standard protocol during bolus
administration of intravenous contrast.
CONTRAST:  100mL OMNIPAQUE IOHEXOL 300 MG/ML  SOLN

[Series 3: cap with · axial · 0.85mm/px · z∈[-275,+280]mm · 11 of 135 slices shown, 13 images]
[im 12/135  soft-tissue]
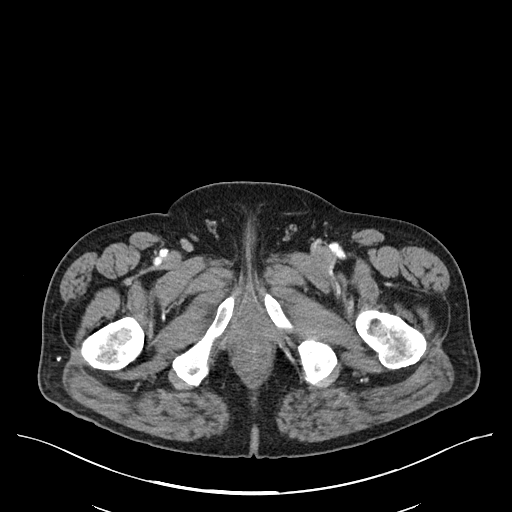
[im 12/135  bone]
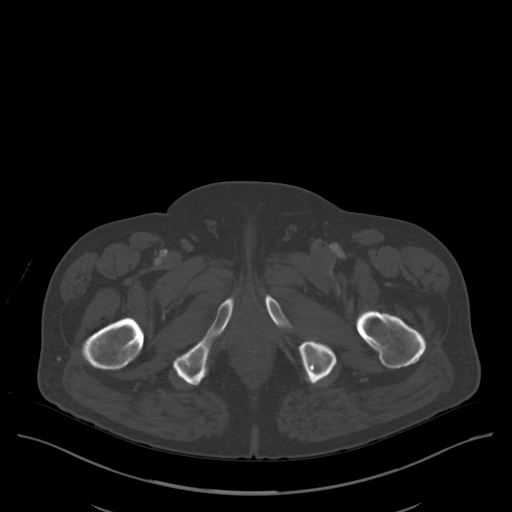
[im 23/135  soft-tissue]
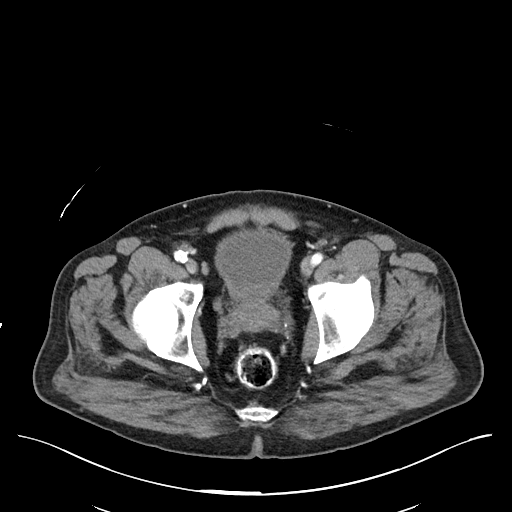
[im 34/135  soft-tissue]
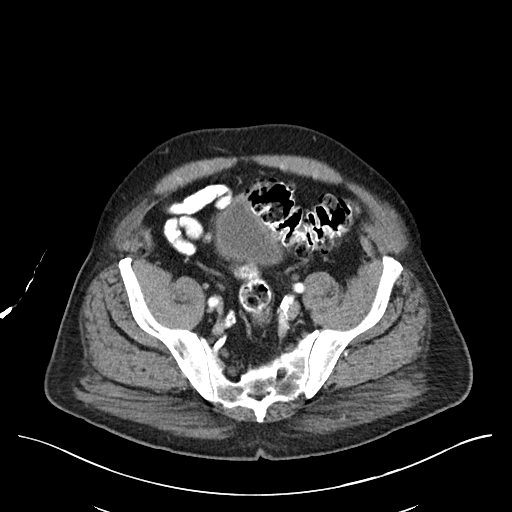
[im 45/135  soft-tissue]
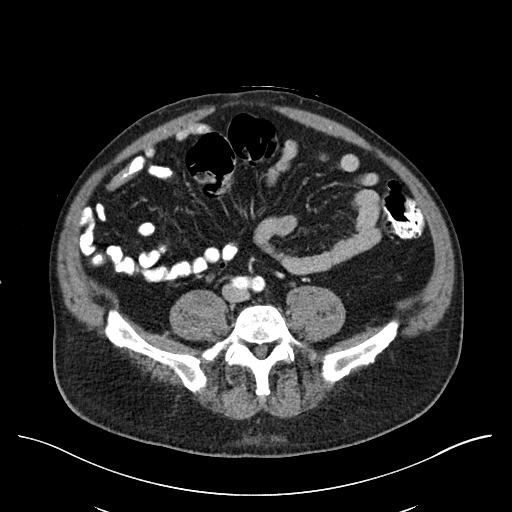
[im 56/135  soft-tissue]
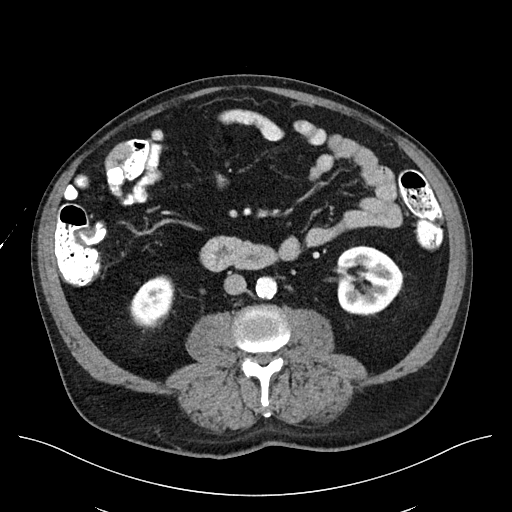
[im 68/135  soft-tissue]
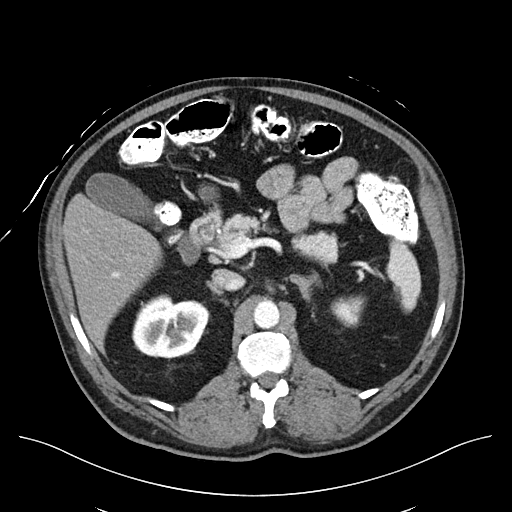
[im 79/135  soft-tissue]
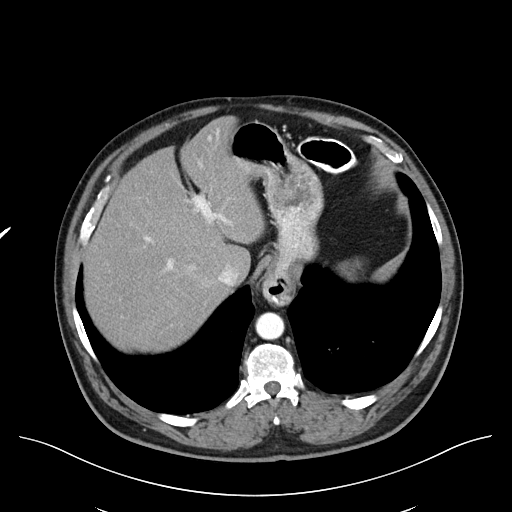
[im 90/135  soft-tissue]
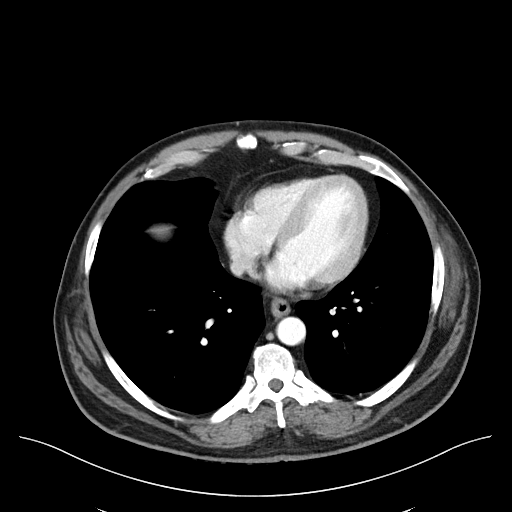
[im 101/135  soft-tissue]
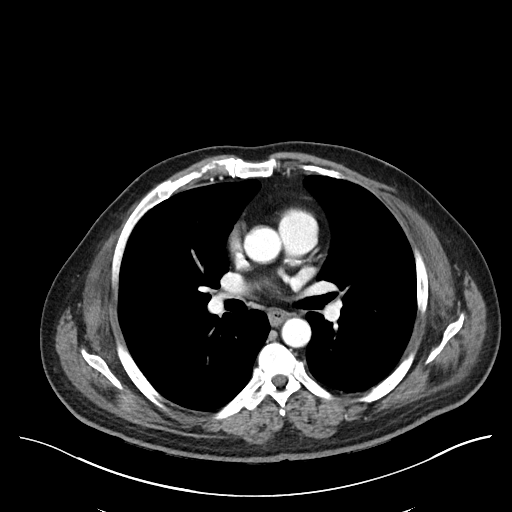
[im 101/135  bone]
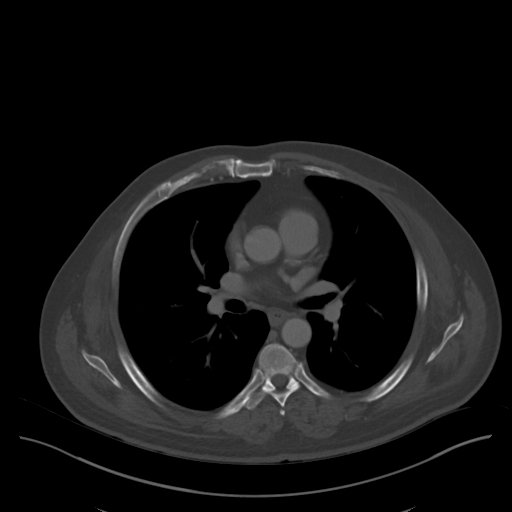
[im 112/135  soft-tissue]
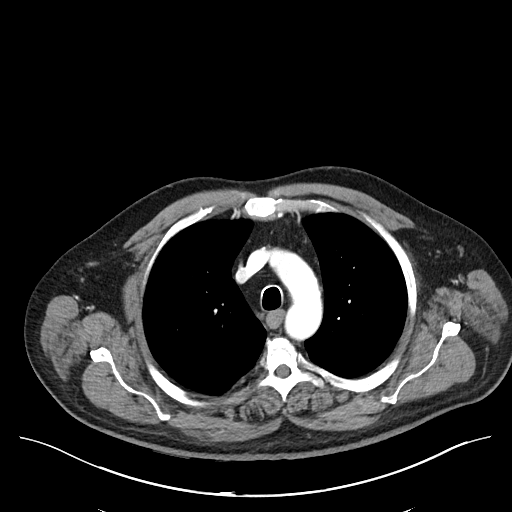
[im 123/135  soft-tissue]
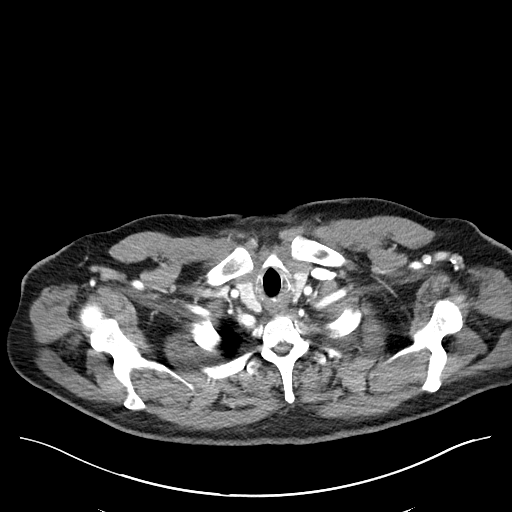

[Series 6: cor · coronal · 0.91mm/px · 3 of 127 slices shown]
[im 43/127  soft-tissue]
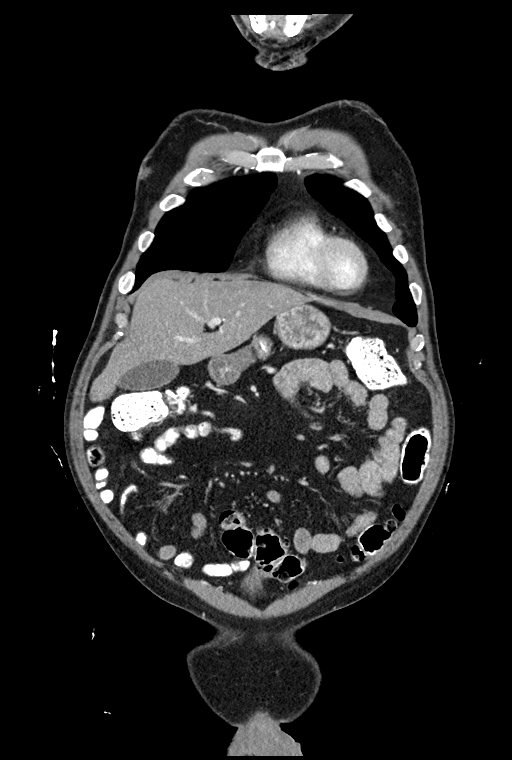
[im 57/127  soft-tissue]
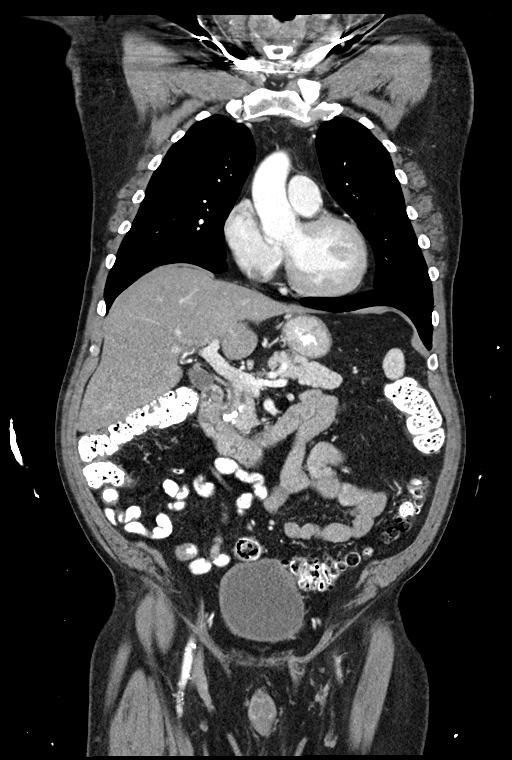
[im 71/127  soft-tissue]
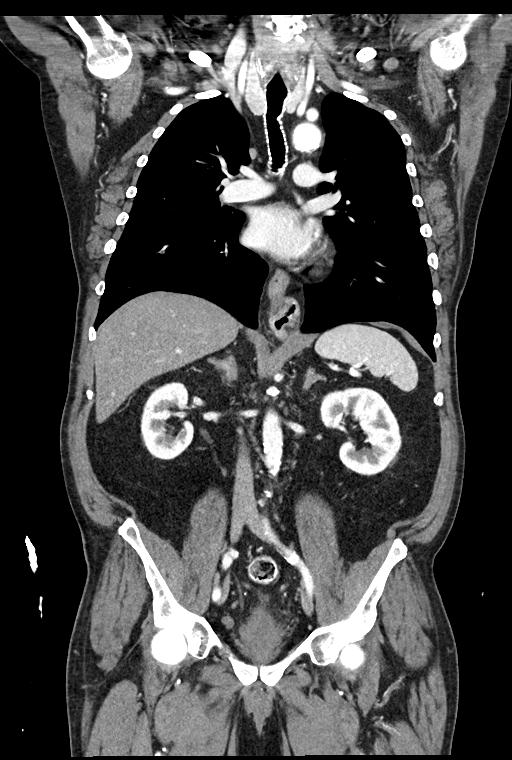

[14 of 46 positions shown; findings below may reference images not displayed]

FINDINGS: CT CHEST FINDINGS

Cardiovascular: The heart size is normal. No substantial pericardial
effusion. Coronary artery calcification is evident. Atherosclerotic
calcification is noted in the wall of the thoracic aorta. Aberrant
origin right subclavian artery noted.

Mediastinum/Nodes: No mediastinal lymphadenopathy. There is no hilar
lymphadenopathy. Small hiatal hernia noted. The esophagus has normal
imaging features. There is no axillary lymphadenopathy.

Lungs/Pleura: The central tracheobronchial airways are patent.
Calcified granuloma noted left upper lobe. No suspicious pulmonary
nodule or mass. No focal airspace consolidation. No pulmonary edema
or pleural effusion.

Musculoskeletal: No worrisome lytic or sclerotic osseous
abnormality.

CT ABDOMEN PELVIS FINDINGS

Hepatobiliary: No suspicious focal abnormality within the liver
parenchyma. There is no evidence for gallstones, gallbladder wall
thickening, or pericholecystic fluid. No intrahepatic or
extrahepatic biliary dilation.

Pancreas: Calcified stones are seen in the head of pancreas, in
close proximity to the ampulla. Distal common bile duct stones could
have this appearance. No substantial dilatation of the main
pancreatic duct.

Spleen: No splenomegaly. No focal mass lesion.

Adrenals/Urinary Tract: No adrenal nodule or mass. Kidneys
unremarkable. No evidence for hydroureter. The urinary bladder
appears normal for the degree of distention.

Stomach/Bowel: Small hiatal hernia. Stomach otherwise unremarkable.
Duodenum is normally positioned as is the ligament of Treitz. No
small bowel wall thickening. No small bowel dilatation. The terminal
ileum is normal. The appendix is normal. No gross colonic mass. No
colonic wall thickening. Diverticular changes are noted in the left
colon without evidence of diverticulitis.

Vascular/Lymphatic: There is abdominal aortic atherosclerosis
without aneurysm. There is no gastrohepatic or hepatoduodenal
ligament lymphadenopathy. No intraperitoneal or retroperitoneal
lymphadenopathy. No pelvic sidewall lymphadenopathy.

Reproductive: Prostate gland appears mildly enlarged.

Other: No intraperitoneal free fluid.

Musculoskeletal: No worrisome lytic or sclerotic osseous
abnormality.
IMPRESSION: 1. No evidence for metastatic disease in the chest, abdomen, or
pelvis.
2. Calcified stones identified in the head of the pancreas in close
proximity to the ampulla. Distal common bile duct stones could have
this appearance. There is no associated biliary duct or main
pancreatic duct dilatation. MRCP may prove helpful to further
evaluate as clinically warranted.
3. Small hiatal hernia.
4. Left colonic diverticulosis without diverticulitis.
5. Prostatomegaly.
6. Aortic Atherosclerosis ([FY]-[FY]).

## 2019-11-10 IMAGING — CT CT ABD-PELV W/ CM
2 of 5 series · 14 of 46 positions shown, 16 images · IV contrast (omnipaque)
Comparison: None.

CLINICAL DATA: Metastatic melanoma of right lower extremity origin
7 years ago.

EXAM:
CT CHEST, ABDOMEN, AND PELVIS WITH CONTRAST
TECHNIQUE: Multidetector CT imaging of the chest, abdomen and pelvis was
performed following the standard protocol during bolus
administration of intravenous contrast.
CONTRAST:  100mL OMNIPAQUE IOHEXOL 300 MG/ML  SOLN

[Series 3: cap with · axial · 0.85mm/px · z∈[-275,+280]mm · 11 of 135 slices shown, 13 images]
[im 12/135  soft-tissue]
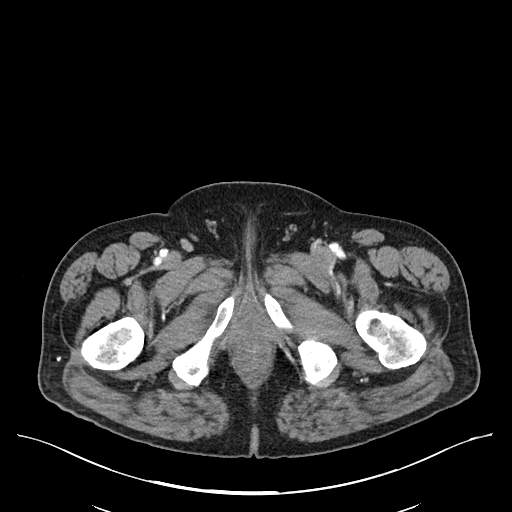
[im 12/135  bone]
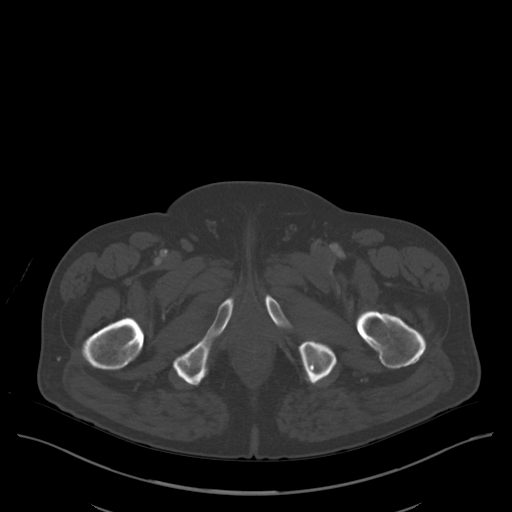
[im 23/135  soft-tissue]
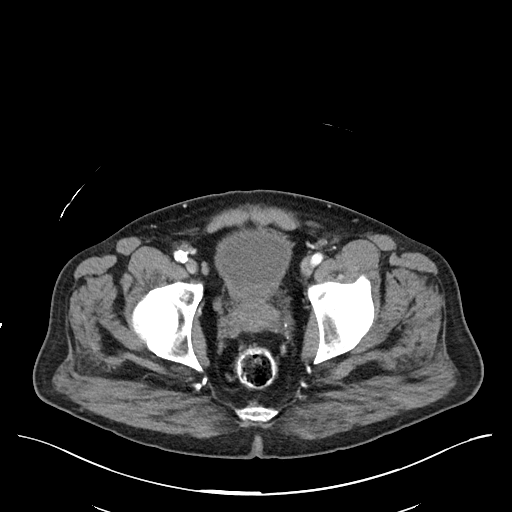
[im 34/135  soft-tissue]
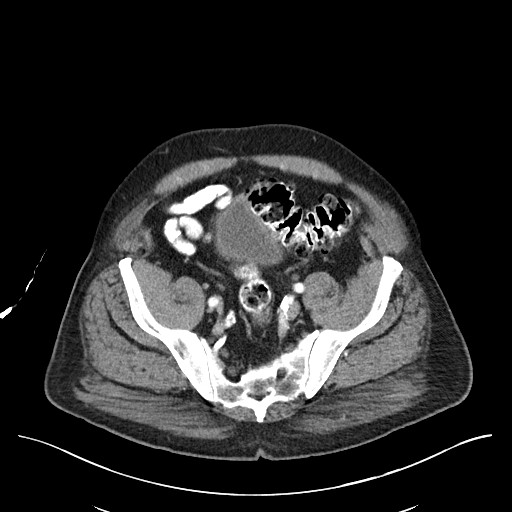
[im 45/135  soft-tissue]
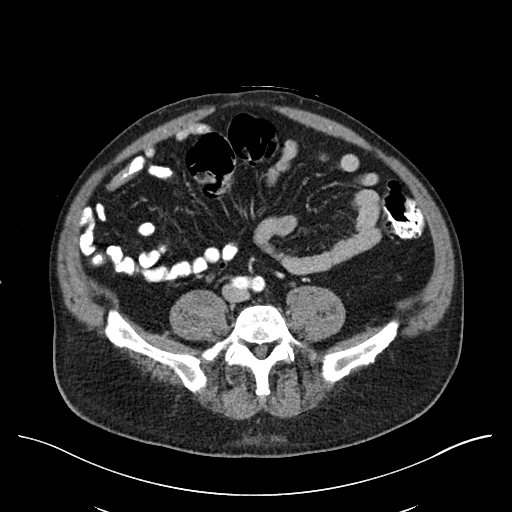
[im 56/135  soft-tissue]
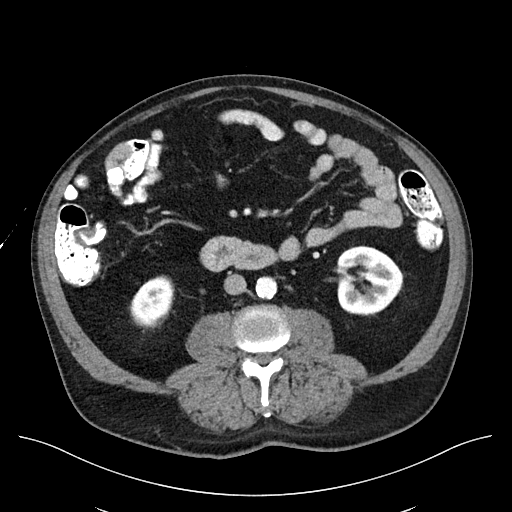
[im 68/135  soft-tissue]
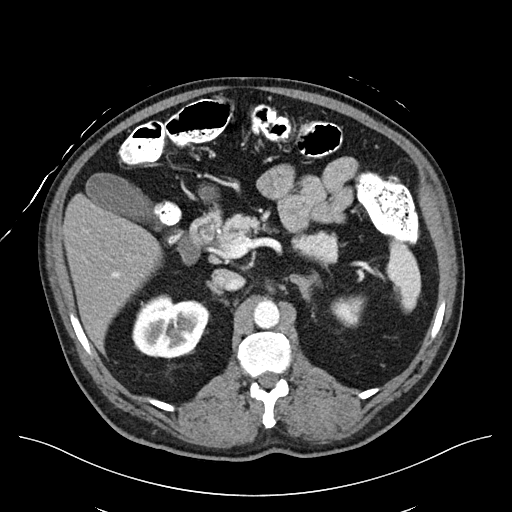
[im 79/135  soft-tissue]
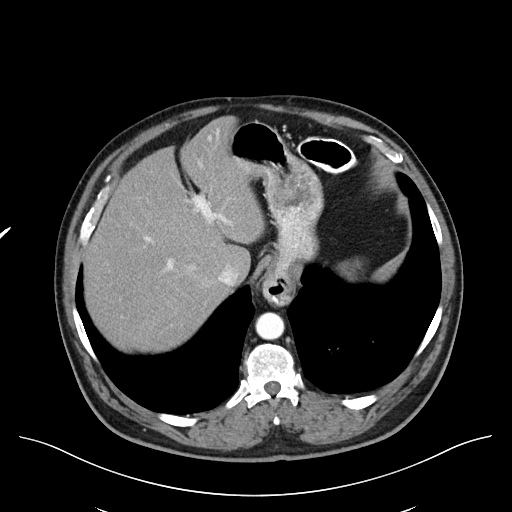
[im 90/135  soft-tissue]
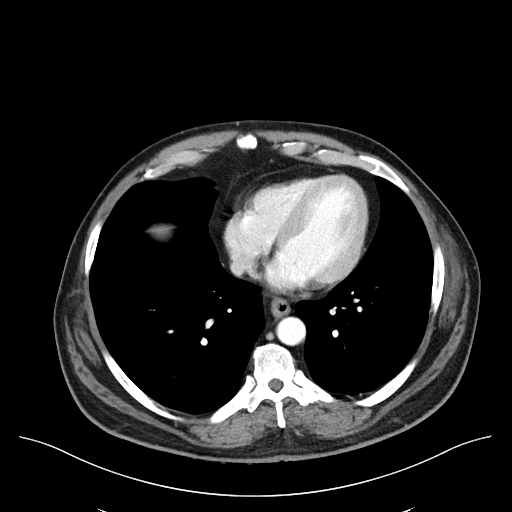
[im 101/135  soft-tissue]
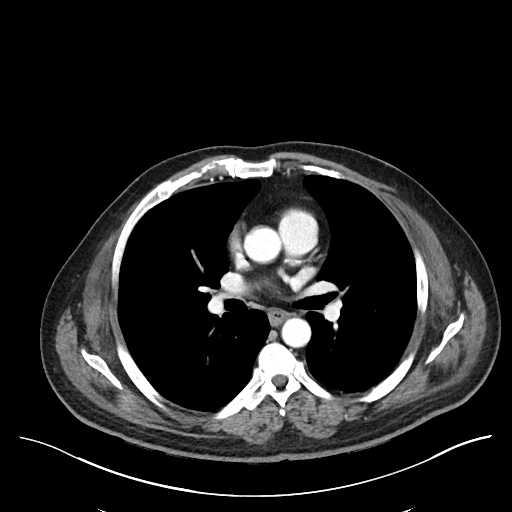
[im 101/135  bone]
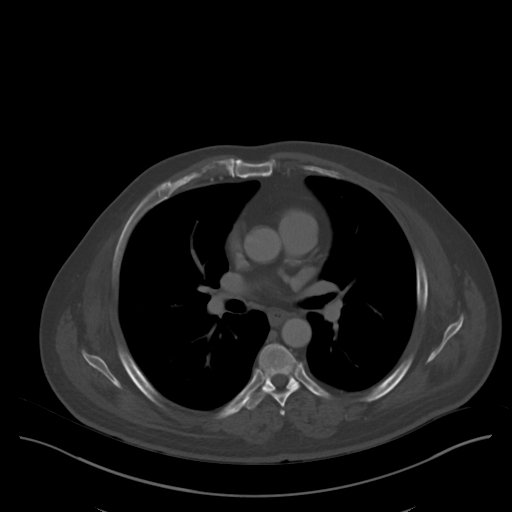
[im 112/135  soft-tissue]
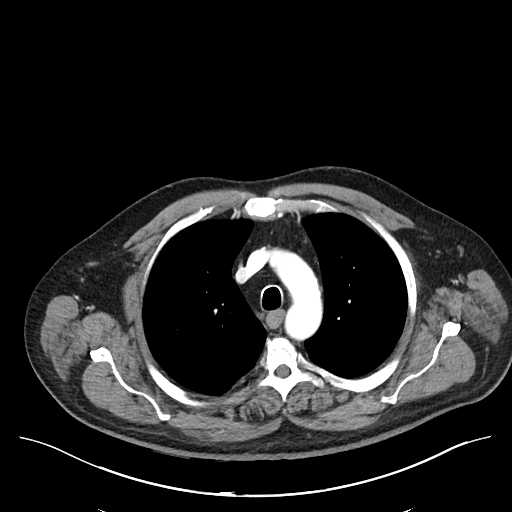
[im 123/135  soft-tissue]
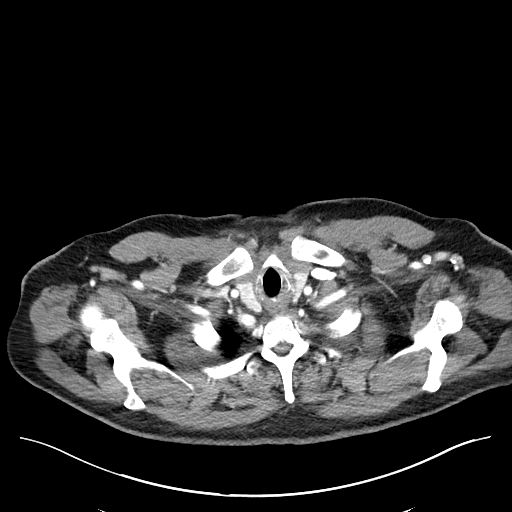

[Series 6: cor · coronal · 0.91mm/px · 3 of 127 slices shown]
[im 43/127  soft-tissue]
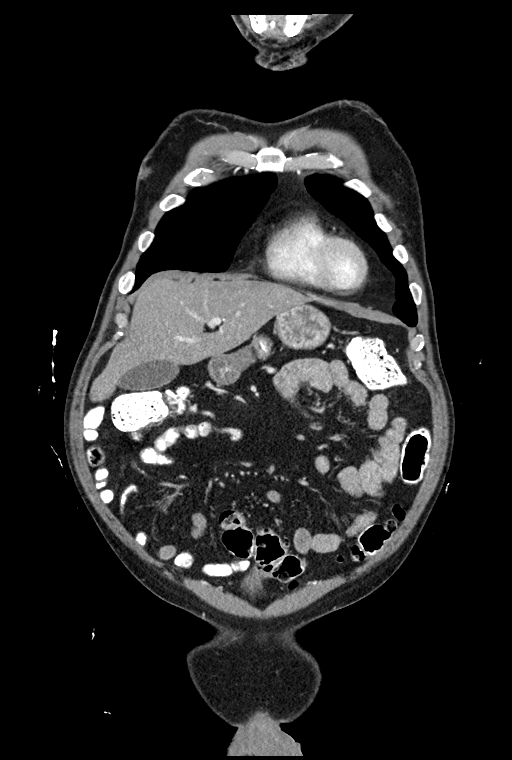
[im 57/127  soft-tissue]
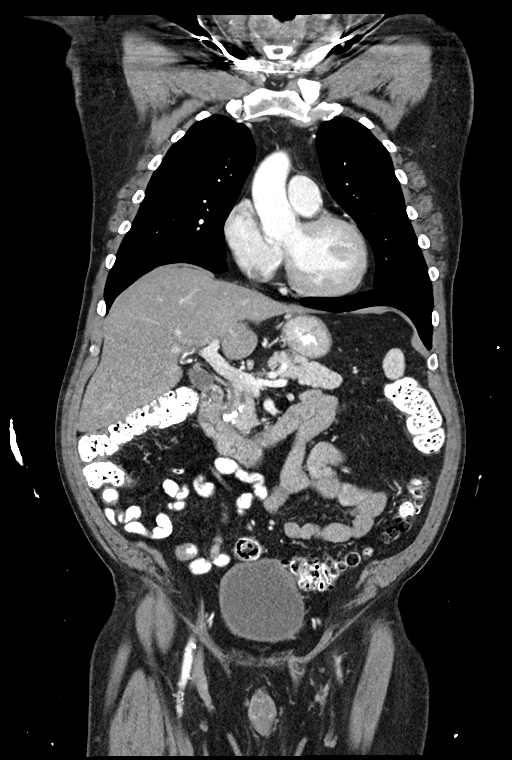
[im 71/127  soft-tissue]
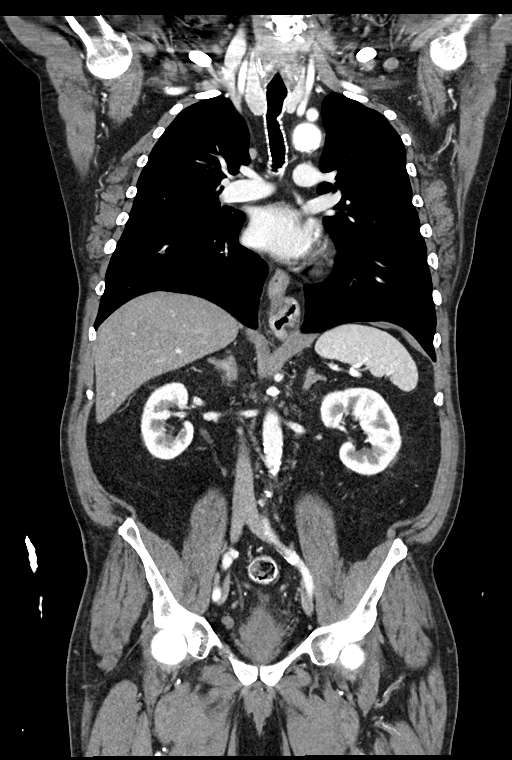

[14 of 46 positions shown; findings below may reference images not displayed]

FINDINGS: CT CHEST FINDINGS

Cardiovascular: The heart size is normal. No substantial pericardial
effusion. Coronary artery calcification is evident. Atherosclerotic
calcification is noted in the wall of the thoracic aorta. Aberrant
origin right subclavian artery noted.

Mediastinum/Nodes: No mediastinal lymphadenopathy. There is no hilar
lymphadenopathy. Small hiatal hernia noted. The esophagus has normal
imaging features. There is no axillary lymphadenopathy.

Lungs/Pleura: The central tracheobronchial airways are patent.
Calcified granuloma noted left upper lobe. No suspicious pulmonary
nodule or mass. No focal airspace consolidation. No pulmonary edema
or pleural effusion.

Musculoskeletal: No worrisome lytic or sclerotic osseous
abnormality.

CT ABDOMEN PELVIS FINDINGS

Hepatobiliary: No suspicious focal abnormality within the liver
parenchyma. There is no evidence for gallstones, gallbladder wall
thickening, or pericholecystic fluid. No intrahepatic or
extrahepatic biliary dilation.

Pancreas: Calcified stones are seen in the head of pancreas, in
close proximity to the ampulla. Distal common bile duct stones could
have this appearance. No substantial dilatation of the main
pancreatic duct.

Spleen: No splenomegaly. No focal mass lesion.

Adrenals/Urinary Tract: No adrenal nodule or mass. Kidneys
unremarkable. No evidence for hydroureter. The urinary bladder
appears normal for the degree of distention.

Stomach/Bowel: Small hiatal hernia. Stomach otherwise unremarkable.
Duodenum is normally positioned as is the ligament of Treitz. No
small bowel wall thickening. No small bowel dilatation. The terminal
ileum is normal. The appendix is normal. No gross colonic mass. No
colonic wall thickening. Diverticular changes are noted in the left
colon without evidence of diverticulitis.

Vascular/Lymphatic: There is abdominal aortic atherosclerosis
without aneurysm. There is no gastrohepatic or hepatoduodenal
ligament lymphadenopathy. No intraperitoneal or retroperitoneal
lymphadenopathy. No pelvic sidewall lymphadenopathy.

Reproductive: Prostate gland appears mildly enlarged.

Other: No intraperitoneal free fluid.

Musculoskeletal: No worrisome lytic or sclerotic osseous
abnormality.
IMPRESSION: 1. No evidence for metastatic disease in the chest, abdomen, or
pelvis.
2. Calcified stones identified in the head of the pancreas in close
proximity to the ampulla. Distal common bile duct stones could have
this appearance. There is no associated biliary duct or main
pancreatic duct dilatation. MRCP may prove helpful to further
evaluate as clinically warranted.
3. Small hiatal hernia.
4. Left colonic diverticulosis without diverticulitis.
5. Prostatomegaly.
6. Aortic Atherosclerosis ([FY]-[FY]).

## 2019-11-10 MED ORDER — IOHEXOL 300 MG/ML  SOLN
100.0000 mL | Freq: Once | INTRAMUSCULAR | Status: AC | PRN
  Administered 2019-11-10: 100 mL via INTRAVENOUS

## 2019-11-25 DIAGNOSIS — Z9289 Personal history of other medical treatment: Secondary | ICD-10-CM | POA: Insufficient documentation

## 2021-11-11 ENCOUNTER — Ambulatory Visit (INDEPENDENT_AMBULATORY_CARE_PROVIDER_SITE_OTHER): Payer: BC Managed Care – PPO | Admitting: Internal Medicine

## 2021-11-11 ENCOUNTER — Encounter: Payer: Self-pay | Admitting: Internal Medicine

## 2021-11-11 VITALS — BP 140/90 | HR 100 | Temp 98.7°F | Ht 64.0 in | Wt 144.3 lb

## 2021-11-11 DIAGNOSIS — E785 Hyperlipidemia, unspecified: Secondary | ICD-10-CM | POA: Diagnosis not present

## 2021-11-11 DIAGNOSIS — C7800 Secondary malignant neoplasm of unspecified lung: Secondary | ICD-10-CM | POA: Diagnosis not present

## 2021-11-11 DIAGNOSIS — E119 Type 2 diabetes mellitus without complications: Secondary | ICD-10-CM | POA: Diagnosis not present

## 2021-11-11 DIAGNOSIS — E1142 Type 2 diabetes mellitus with diabetic polyneuropathy: Secondary | ICD-10-CM | POA: Diagnosis not present

## 2021-11-11 DIAGNOSIS — I10 Essential (primary) hypertension: Secondary | ICD-10-CM | POA: Diagnosis not present

## 2021-11-11 MED ORDER — LISINOPRIL-HYDROCHLOROTHIAZIDE 20-12.5 MG PO TABS: 2.0000 | ORAL_TABLET | Freq: Every day | ORAL | 1 refills | Status: DC

## 2021-11-11 NOTE — Progress Notes (Signed)
Established Patient Office Visit     This visit occurred during the SARS-CoV-2 public health emergency.  Safety protocols were in place, including screening questions prior to the visit, additional usage of staff PPE, and extensive cleaning of exam room while observing appropriate contact time as indicated for disinfecting solutions.    CC/Reason for Visit: Reestablish care  HPI: Kenneth [name] is a 60 y.o. male who is coming in today for the above mentioned reasons. Past Medical History is significant for: Hypertension, hyperlipidemia, type 2 diabetes, metastatic melanoma.  I have not seen him in 3 years.  He has not been taking medications.  He has lost 60 pounds.  He has not had medical care he states due to the pandemic.  He has no concerns other than progressive peripheral neuropathy.   Past Medical/Surgical History: Past Medical History:  Diagnosis Date   Cancer ([place])    metastatic melanoma- spread to lungs, two "spots" on brain per xray- last chemo tx in 2014, September- "all clear", no tx now   Diabetes mellitus without complication ([place])    Hyperlipidemia    Hypertension     Past Surgical History:  Procedure Laterality Date   BRAIN SURGERY  2015   to check for possible malignancy from melanoma, result were scar tissue   melanoma exicison     right leg    Social History:  reports that he has never smoked. His smokeless tobacco use includes snuff. He reports that he does not drink alcohol and does not use drugs.  Allergies: Allergies  Allergen Reactions   Cat Hair Extract Shortness Of Breath and Swelling    Swelling, watery eyes    Family History:  Family History  Problem Relation Age of Onset   Prostate cancer Father    Stomach cancer Paternal Grandmother    Colon cancer Neg Hx    Esophageal cancer Neg Hx    Rectal cancer Neg Hx      Current Outpatient Medications:    amLODipine (NORVASC) 10 MG tablet, Take 1 tablet (10 mg total) by mouth daily.  (Patient not taking: Reported on 11/11/2021), Disp: 90 tablet, Rfl: 1   atorvastatin (LIPITOR) 20 MG tablet, Take 1 tablet (20 mg total) by mouth daily. (Patient not taking: Reported on 11/11/2021), Disp: 90 tablet, Rfl: 2   glimepiride (AMARYL) 2 MG tablet, TAKE 1/2 TO 1 TABLET BY MOUTH EVERY DAY WITH BREAKFAST (Patient not taking: Reported on 11/11/2021), Disp: 90 tablet, Rfl: 1   lisinopril-hydrochlorothiazide (ZESTORETIC) 20-12.5 MG tablet, Take 2 tablets by mouth daily., Disp: 90 tablet, Rfl: 1   metFORMIN (GLUCOPHAGE) 1000 MG tablet, TAKE 1 TABLET BY MOUTH TWICE A DAY WITH A MEAL (Patient not taking: Reported on 11/11/2021), Disp: 180 tablet, Rfl: 1   metoprolol tartrate (LOPRESSOR) 100 MG tablet, Take 1 tablet (100 mg total) by mouth 2 (two) times daily. (Patient not taking: Reported on 11/11/2021), Disp: 180 tablet, Rfl: 3   MULTIPLE VITAMIN PO, Take 1 tablet by mouth daily. (Patient not taking: Reported on 11/11/2021), Disp: , Rfl:    naproxen sodium (ALEVE) 220 MG tablet, Take 220 mg by mouth as needed.  (Patient not taking: Reported on 11/11/2021), Disp: , Rfl:    pioglitazone (ACTOS) 45 MG tablet, Take 1 tablet (45 mg total) by mouth daily. (Patient not taking: Reported on 11/11/2021), Disp: 90 tablet, Rfl: 1  Review of Systems:  Constitutional: Denies fever, chills, diaphoresis, appetite change and fatigue.  HEENT: Denies photophobia, eye pain,  redness, hearing loss, ear pain, congestion, sore throat, rhinorrhea, sneezing, mouth sores, trouble swallowing, neck pain, neck stiffness and tinnitus.   Respiratory: Denies SOB, DOE, cough, chest tightness,  and wheezing.   Cardiovascular: Denies chest pain, palpitations and leg swelling.  Gastrointestinal: Denies nausea, vomiting, abdominal pain, diarrhea, constipation, blood in stool and abdominal distention.  Genitourinary: Denies dysuria, urgency, frequency, hematuria, flank pain and difficulty urinating.  Endocrine: Denies: hot or cold  intolerance, sweats, changes in hair or nails, polyuria, polydipsia. Musculoskeletal: Denies myalgias, back pain, joint swelling, arthralgias and gait problem.  Skin: Denies pallor, rash and wound.  Neurological: Denies dizziness, seizures, syncope, weakness, light-headedness  and headaches.  Hematological: Denies adenopathy. Easy bruising, personal or family bleeding history  Psychiatric/Behavioral: Denies suicidal ideation, mood changes, confusion, nervousness, sleep disturbance and agitation    Physical Exam: Vitals:   11/11/21 1519  BP: 140/90  Pulse: 100  Temp: 98.7 F (37.1 C)  TempSrc: Oral  SpO2: 98%  Weight: 144 lb 4.8 oz (65.5 kg)  Height: 5\' 4"  (1.626 m)    Body mass index is 24.77 kg/m.   Constitutional: NAD, calm, comfortable Eyes: PERRL, lids and conjunctivae normal ENMT: Mucous membranes are moist. Posterior pharynx clear of any exudate or lesions. Normal dentition. Tympanic membrane is pearly white, no erythema or bulging. Neck: normal, supple, no masses, no thyromegaly Respiratory: clear to auscultation bilaterally, no wheezing, no crackles. Normal respiratory effort. No accessory muscle use.  Cardiovascular: Regular rate and rhythm, no murmurs / rubs / gallops. No extremity edema. 2+ pedal pulses. No carotid bruits.  Abdomen: no tenderness, no masses palpated. No hepatosplenomegaly. Bowel sounds positive.  Musculoskeletal: no clubbing / cyanosis. No joint deformity upper and lower extremities. Good ROM, no contractures. Normal muscle tone.  Skin: no rashes, lesions, ulcers. No induration Neurologic: CN 2-12 grossly intact. Sensation intact, DTR normal. Strength 5/5 in all 4.  Psychiatric: Normal judgment and insight. Alert and oriented x 3. Normal mood.    Impression and Plan:  Type 2 diabetes complicated by peripheral neuropathy -Check A1c, suspect will be quite uncontrolled as he is no longer on medication.  Dyslipidemia  - Plan: Lipid panel -Goal LDL  is less than 70, will likely need medication pending results.  Essential hypertension  - Plan: lisinopril-hydrochlorothiazide (ZESTORETIC) 20-12.5 MG tablet, CBC with Differential/Platelet, Comprehensive metabolic panel -Resume Zestoretic as blood pressure is uncontrolled at 140/90, check labs. -Return in 6 weeks for follow-up.  Melanoma metastatic to lung, unspecified laterality ([place])  -Noted   Time spent: 34 minutes reviewing chart, interviewing and examining patient and formulating plan of care.  Patient Instructions  -Nice seeing you today!!  -Lab work today; will notify you once results are available.  -Start lisinopril HCT 1 tablet daily.  -Schedule follow up in 6-8 weeks.  -Consider your flu, COVID booster and shingles vaccines.   Lelon Frohlich, MD [facility]

## 2021-11-11 NOTE — Patient Instructions (Signed)
-  Nice seeing you today!!  -Lab work today; will notify you once results are available.  -Start lisinopril HCT 1 tablet daily.  -Schedule follow up in 6-8 weeks.  -Consider your flu, COVID booster and shingles vaccines.

## 2021-11-12 LAB — COMPREHENSIVE METABOLIC PANEL
AG Ratio: 1.3 (calc) (ref 1.0–2.5)
ALT: 9 U/L (ref 9–46)
AST: 14 U/L (ref 10–35)
Albumin: 3.2 g/dL — ABNORMAL LOW (ref 3.6–5.1)
Alkaline phosphatase (APISO): 51 U/L (ref 35–144)
BUN/Creatinine Ratio: 6 (calc) (ref 6–22)
BUN: 5 mg/dL — ABNORMAL LOW (ref 7–25)
CO2: 29 mmol/L (ref 20–32)
Calcium: 8.7 mg/dL (ref 8.6–10.3)
Chloride: 95 mmol/L — ABNORMAL LOW (ref 98–110)
Creat: 0.87 mg/dL (ref 0.70–1.35)
Globulin: 2.5 g/dL (calc) (ref 1.9–3.7)
Glucose, Bld: 121 mg/dL — ABNORMAL HIGH (ref 65–99)
Potassium: 3.2 mmol/L — ABNORMAL LOW (ref 3.5–5.3)
Sodium: 134 mmol/L — ABNORMAL LOW (ref 135–146)
Total Bilirubin: 0.7 mg/dL (ref 0.2–1.2)
Total Protein: 5.7 g/dL — ABNORMAL LOW (ref 6.1–8.1)

## 2021-11-12 LAB — LIPID PANEL
Cholesterol: 143 mg/dL (ref <200)
HDL: 86 mg/dL (ref >=40)
LDL Cholesterol (Calc): 44 mg/dL (calc)
Non-HDL Cholesterol (Calc): 57 mg/dL (calc) (ref <130)
Total CHOL/HDL Ratio: 1.7 (calc) (ref <5.0)
Triglycerides: 44 mg/dL (ref <150)

## 2021-11-12 LAB — CBC WITH DIFFERENTIAL/PLATELET
Absolute Monocytes: 736 cells/uL (ref 200–950)
Basophils Absolute: 64 cells/uL (ref 0–200)
Basophils Relative: 0.7 %
Eosinophils Absolute: 64 cells/uL (ref 15–500)
Eosinophils Relative: 0.7 %
HCT: 46.9 % (ref 38.5–50.0)
Hemoglobin: 16.6 g/dL (ref 13.2–17.1)
Lymphs Abs: 1481 cells/uL (ref 850–3900)
MCH: 32.6 pg (ref 27.0–33.0)
MCHC: 35.4 g/dL (ref 32.0–36.0)
MCV: 92.1 fL (ref 80.0–100.0)
MPV: 10.4 fL (ref 7.5–12.5)
Monocytes Relative: 8 %
Neutro Abs: 6854 cells/uL (ref 1500–7800)
Neutrophils Relative %: 74.5 %
Platelets: 309 10*3/uL (ref 140–400)
RBC: 5.09 10*6/uL (ref 4.20–5.80)
RDW: 11.2 % (ref 11.0–15.0)
Total Lymphocyte: 16.1 %
WBC: 9.2 10*3/uL (ref 3.8–10.8)

## 2021-11-12 LAB — HEMOGLOBIN A1C
Hgb A1c MFr Bld: 6.1 % of total Hgb — ABNORMAL HIGH (ref <5.7)
Mean Plasma Glucose: 128 mg/dL
eAG (mmol/L): 7.1 mmol/L

## 2021-11-12 LAB — PSA: PSA: 5.28 ng/mL — ABNORMAL HIGH (ref <=4.00)

## 2021-11-12 LAB — VITAMIN B12: Vitamin B-12: 243 pg/mL (ref 200–1100)

## 2021-11-24 ENCOUNTER — Other Ambulatory Visit: Payer: Self-pay | Admitting: Internal Medicine

## 2021-11-24 ENCOUNTER — Encounter: Payer: Self-pay | Admitting: Internal Medicine

## 2021-11-24 DIAGNOSIS — R972 Elevated prostate specific antigen [PSA]: Secondary | ICD-10-CM | POA: Insufficient documentation

## 2021-11-24 DIAGNOSIS — E876 Hypokalemia: Secondary | ICD-10-CM

## 2021-11-24 MED ORDER — POTASSIUM CHLORIDE CRYS ER 20 MEQ PO TBCR: 40.0000 meq | EXTENDED_RELEASE_TABLET | Freq: Every day | ORAL | 0 refills | Status: DC

## 2022-01-24 ENCOUNTER — Emergency Department (HOSPITAL_COMMUNITY): Payer: BC Managed Care – PPO

## 2022-01-24 ENCOUNTER — Inpatient Hospital Stay (HOSPITAL_COMMUNITY)
Admission: EM | Admit: 2022-01-24 | Discharge: 2022-01-26 | DRG: 065 | Disposition: A | Discharge status: Home / Self Care | Payer: BC Managed Care – PPO | Attending: Family Medicine | Admitting: Family Medicine

## 2022-01-24 ENCOUNTER — Encounter (HOSPITAL_COMMUNITY): Payer: Self-pay

## 2022-01-24 ENCOUNTER — Encounter: Payer: Self-pay | Admitting: Gastroenterology

## 2022-01-24 ENCOUNTER — Observation Stay (HOSPITAL_COMMUNITY): Payer: BC Managed Care – PPO

## 2022-01-24 DIAGNOSIS — N179 Acute kidney failure, unspecified: Secondary | ICD-10-CM | POA: Diagnosis present

## 2022-01-24 DIAGNOSIS — I6381 Other cerebral infarction due to occlusion or stenosis of small artery: Principal | ICD-10-CM | POA: Diagnosis present

## 2022-01-24 DIAGNOSIS — I639 Cerebral infarction, unspecified: Secondary | ICD-10-CM | POA: Diagnosis not present

## 2022-01-24 DIAGNOSIS — Z9221 Personal history of antineoplastic chemotherapy: Secondary | ICD-10-CM

## 2022-01-24 DIAGNOSIS — F1010 Alcohol abuse, uncomplicated: Secondary | ICD-10-CM | POA: Diagnosis not present

## 2022-01-24 DIAGNOSIS — Z9114 Patient's other noncompliance with medication regimen: Secondary | ICD-10-CM

## 2022-01-24 DIAGNOSIS — E876 Hypokalemia: Secondary | ICD-10-CM | POA: Diagnosis not present

## 2022-01-24 DIAGNOSIS — R296 Repeated falls: Secondary | ICD-10-CM | POA: Diagnosis present

## 2022-01-24 DIAGNOSIS — Z79899 Other long term (current) drug therapy: Secondary | ICD-10-CM

## 2022-01-24 DIAGNOSIS — F17220 Nicotine dependence, chewing tobacco, uncomplicated: Secondary | ICD-10-CM | POA: Diagnosis not present

## 2022-01-24 DIAGNOSIS — I10 Essential (primary) hypertension: Secondary | ICD-10-CM | POA: Diagnosis not present

## 2022-01-24 DIAGNOSIS — R9431 Abnormal electrocardiogram [ECG] [EKG]: Secondary | ICD-10-CM | POA: Diagnosis not present

## 2022-01-24 DIAGNOSIS — R634 Abnormal weight loss: Secondary | ICD-10-CM | POA: Diagnosis present

## 2022-01-24 DIAGNOSIS — Z7984 Long term (current) use of oral hypoglycemic drugs: Secondary | ICD-10-CM

## 2022-01-24 DIAGNOSIS — E871 Hypo-osmolality and hyponatremia: Secondary | ICD-10-CM | POA: Diagnosis not present

## 2022-01-24 DIAGNOSIS — Z85118 Personal history of other malignant neoplasm of bronchus and lung: Secondary | ICD-10-CM

## 2022-01-24 DIAGNOSIS — Z8673 Personal history of transient ischemic attack (TIA), and cerebral infarction without residual deficits: Secondary | ICD-10-CM | POA: Diagnosis not present

## 2022-01-24 DIAGNOSIS — R42 Dizziness and giddiness: Secondary | ICD-10-CM

## 2022-01-24 DIAGNOSIS — S069X0A Unspecified intracranial injury without loss of consciousness, initial encounter: Secondary | ICD-10-CM | POA: Diagnosis not present

## 2022-01-24 DIAGNOSIS — Z20822 Contact with and (suspected) exposure to covid-19: Secondary | ICD-10-CM | POA: Diagnosis not present

## 2022-01-24 DIAGNOSIS — Z923 Personal history of irradiation: Secondary | ICD-10-CM | POA: Diagnosis not present

## 2022-01-24 DIAGNOSIS — I700 Atherosclerosis of aorta: Secondary | ICD-10-CM | POA: Diagnosis not present

## 2022-01-24 DIAGNOSIS — E785 Hyperlipidemia, unspecified: Secondary | ICD-10-CM | POA: Diagnosis present

## 2022-01-24 DIAGNOSIS — I6782 Cerebral ischemia: Secondary | ICD-10-CM | POA: Diagnosis not present

## 2022-01-24 DIAGNOSIS — Z85820 Personal history of malignant melanoma of skin: Secondary | ICD-10-CM

## 2022-01-24 DIAGNOSIS — E1142 Type 2 diabetes mellitus with diabetic polyneuropathy: Secondary | ICD-10-CM | POA: Diagnosis present

## 2022-01-24 DIAGNOSIS — I6389 Other cerebral infarction: Secondary | ICD-10-CM | POA: Diagnosis not present

## 2022-01-24 DIAGNOSIS — Z85841 Personal history of malignant neoplasm of brain: Secondary | ICD-10-CM

## 2022-01-24 DIAGNOSIS — R519 Headache, unspecified: Secondary | ICD-10-CM | POA: Diagnosis not present

## 2022-01-24 DIAGNOSIS — Z91048 Other nonmedicinal substance allergy status: Secondary | ICD-10-CM

## 2022-01-24 DIAGNOSIS — C7800 Secondary malignant neoplasm of unspecified lung: Secondary | ICD-10-CM | POA: Diagnosis present

## 2022-01-24 DIAGNOSIS — I959 Hypotension, unspecified: Secondary | ICD-10-CM | POA: Diagnosis not present

## 2022-01-24 DIAGNOSIS — F1029 Alcohol dependence with unspecified alcohol-induced disorder: Secondary | ICD-10-CM

## 2022-01-24 DIAGNOSIS — R2681 Unsteadiness on feet: Secondary | ICD-10-CM

## 2022-01-24 DIAGNOSIS — I6523 Occlusion and stenosis of bilateral carotid arteries: Secondary | ICD-10-CM | POA: Diagnosis not present

## 2022-01-24 DIAGNOSIS — E119 Type 2 diabetes mellitus without complications: Secondary | ICD-10-CM

## 2022-01-24 HISTORY — DX: Cerebral infarction, unspecified: I63.9

## 2022-01-24 LAB — CBC
HCT: 37.6 % — ABNORMAL LOW (ref 39.0–52.0)
Hemoglobin: 13.6 g/dL (ref 13.0–17.0)
MCH: 31.3 pg (ref 26.0–34.0)
MCHC: 36.2 g/dL — ABNORMAL HIGH (ref 30.0–36.0)
MCV: 86.4 fL (ref 80.0–100.0)
Platelets: 298 10*3/uL (ref 150–400)
RBC: 4.35 MIL/uL (ref 4.22–5.81)
RDW: 11.6 % (ref 11.5–15.5)
WBC: 7.7 10*3/uL (ref 4.0–10.5)
nRBC: 0 % (ref 0.0–0.2)

## 2022-01-24 LAB — CBC WITH DIFFERENTIAL/PLATELET
Abs Immature Granulocytes: 0.09 10*3/uL — ABNORMAL HIGH (ref 0.00–0.07)
Basophils Absolute: 0 10*3/uL (ref 0.0–0.1)
Basophils Relative: 0 %
Eosinophils Absolute: 0 10*3/uL (ref 0.0–0.5)
Eosinophils Relative: 0 %
HCT: 37.7 % — ABNORMAL LOW (ref 39.0–52.0)
Hemoglobin: 14.1 g/dL (ref 13.0–17.0)
Immature Granulocytes: 1 %
Lymphocytes Relative: 7 %
Lymphs Abs: 0.8 10*3/uL (ref 0.7–4.0)
MCH: 31.7 pg (ref 26.0–34.0)
MCHC: 37.4 g/dL — ABNORMAL HIGH (ref 30.0–36.0)
MCV: 84.7 fL (ref 80.0–100.0)
Monocytes Absolute: 0.7 10*3/uL (ref 0.1–1.0)
Monocytes Relative: 6 %
Neutro Abs: 10.3 10*3/uL — ABNORMAL HIGH (ref 1.7–7.7)
Neutrophils Relative %: 86 %
Platelets: 370 10*3/uL (ref 150–400)
RBC: 4.45 MIL/uL (ref 4.22–5.81)
RDW: 11.6 % (ref 11.5–15.5)
WBC: 11.9 10*3/uL — ABNORMAL HIGH (ref 4.0–10.5)
nRBC: 0 % (ref 0.0–0.2)

## 2022-01-24 LAB — COMPREHENSIVE METABOLIC PANEL
ALT: 15 U/L (ref 0–44)
AST: 18 U/L (ref 15–41)
Albumin: 3.5 g/dL (ref 3.5–5.0)
Alkaline Phosphatase: 56 U/L (ref 38–126)
Anion gap: 15 (ref 5–15)
BUN: 21 mg/dL — ABNORMAL HIGH (ref 6–20)
CO2: 28 mmol/L (ref 22–32)
Calcium: 9.1 mg/dL (ref 8.9–10.3)
Chloride: 77 mmol/L — ABNORMAL LOW (ref 98–111)
Creatinine, Ser: 1.81 mg/dL — ABNORMAL HIGH (ref 0.61–1.24)
GFR, Estimated: 42 mL/min — ABNORMAL LOW (ref >60)
Glucose, Bld: 219 mg/dL — ABNORMAL HIGH (ref 70–99)
Potassium: 3 mmol/L — ABNORMAL LOW (ref 3.5–5.1)
Sodium: 120 mmol/L — ABNORMAL LOW (ref 135–145)
Total Bilirubin: 1.2 mg/dL (ref 0.3–1.2)
Total Protein: 6.3 g/dL — ABNORMAL LOW (ref 6.5–8.1)

## 2022-01-24 LAB — CORTISOL: Cortisol, Plasma: 16.4 ug/dL

## 2022-01-24 LAB — BASIC METABOLIC PANEL
Anion gap: 11 (ref 5–15)
BUN: 19 mg/dL (ref 6–20)
CO2: 28 mmol/L (ref 22–32)
Calcium: 8.5 mg/dL — ABNORMAL LOW (ref 8.9–10.3)
Chloride: 83 mmol/L — ABNORMAL LOW (ref 98–111)
Creatinine, Ser: 1.57 mg/dL — ABNORMAL HIGH (ref 0.61–1.24)
GFR, Estimated: 50 mL/min — ABNORMAL LOW (ref >60)
Glucose, Bld: 173 mg/dL — ABNORMAL HIGH (ref 70–99)
Potassium: 3 mmol/L — ABNORMAL LOW (ref 3.5–5.1)
Sodium: 122 mmol/L — ABNORMAL LOW (ref 135–145)

## 2022-01-24 LAB — SODIUM, URINE, RANDOM: Sodium, Ur: 25 mmol/L

## 2022-01-24 LAB — TSH: TSH: 1.624 u[IU]/mL (ref 0.350–4.500)

## 2022-01-24 LAB — RESP PANEL BY RT-PCR (FLU A&B, COVID) ARPGX2
Influenza A by PCR: NEGATIVE
Influenza B by PCR: NEGATIVE
SARS Coronavirus 2 by RT PCR: NEGATIVE

## 2022-01-24 LAB — HIV ANTIBODY (ROUTINE TESTING W REFLEX): HIV Screen 4th Generation wRfx: NONREACTIVE

## 2022-01-24 LAB — FOLATE: Folate: 10.7 ng/mL (ref >5.9)

## 2022-01-24 LAB — ETHANOL: Alcohol, Ethyl (B): <10 mg/dL (ref <10)

## 2022-01-24 LAB — VITAMIN B12: Vitamin B-12: 231 pg/mL (ref 180–914)

## 2022-01-24 LAB — OSMOLALITY, URINE: Osmolality, Ur: 261 mOsm/kg — ABNORMAL LOW (ref 300–900)

## 2022-01-24 LAB — MAGNESIUM: Magnesium: 1.5 mg/dL — ABNORMAL LOW (ref 1.7–2.4)

## 2022-01-24 IMAGING — CT CT HEAD W/O CM
3 of 4 series · 15 of 47 positions shown, 18 images · non-contrast
Comparison: None.

CLINICAL DATA: Headache, chronic, new features or increased
frequency



[Series 4: head 2.0 h70h · axial · 0.44mm/px · z∈[-165,-41]mm · 9 of 78 slices shown, 12 images]
[im 8/78  brain]
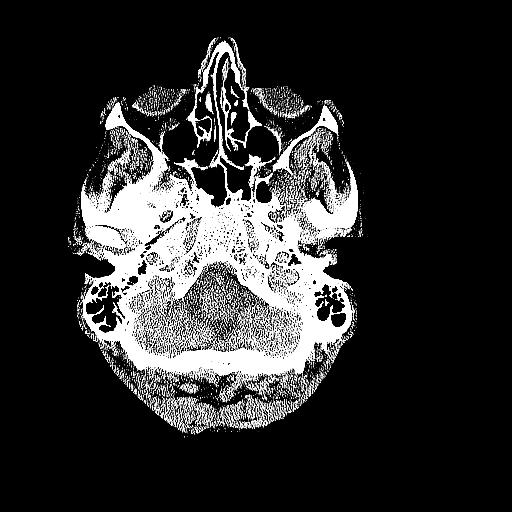
[im 8/78  bone]
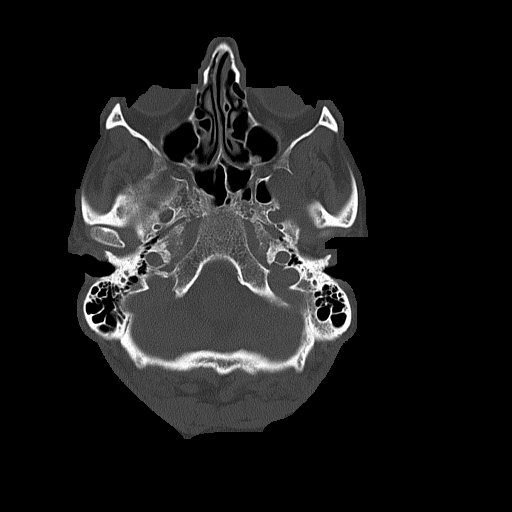
[im 16/78  brain]
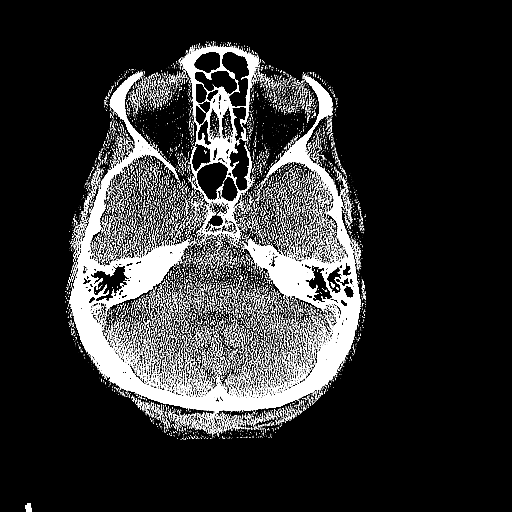
[im 24/78  brain]
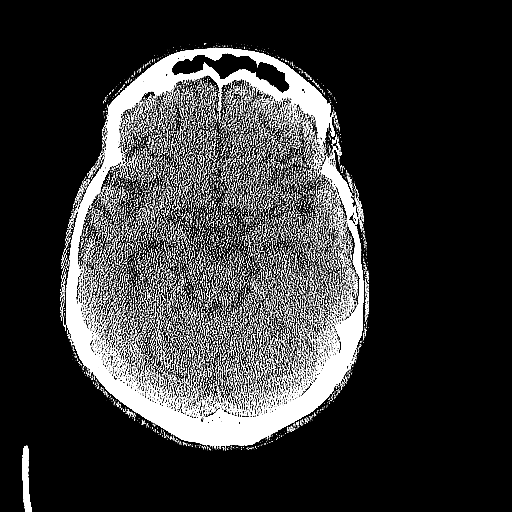
[im 31/78  brain]
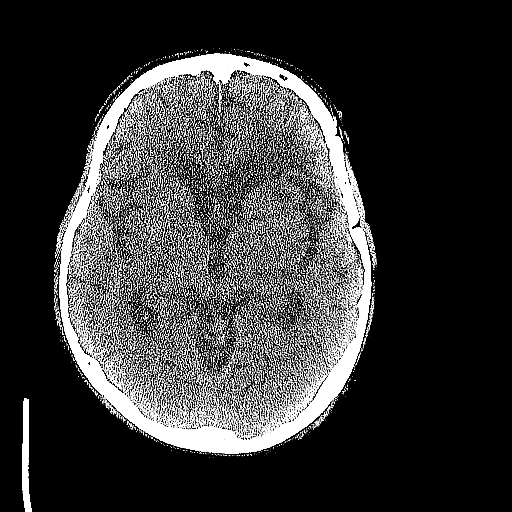
[im 39/78  brain]
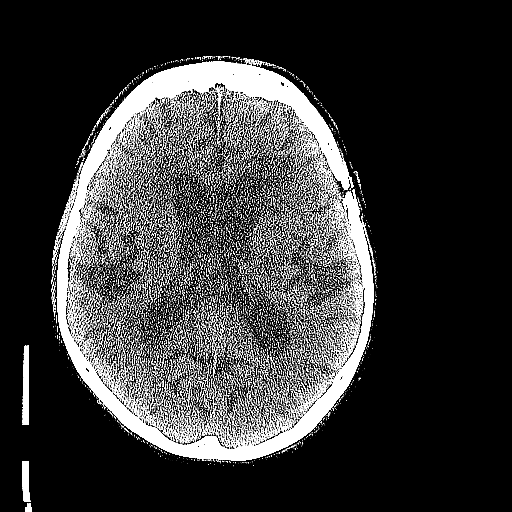
[im 39/78  bone]
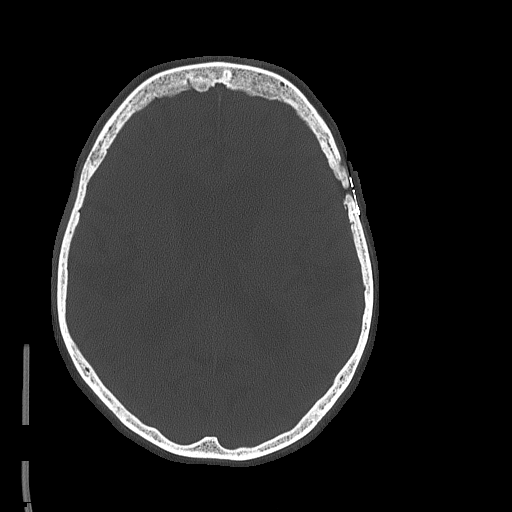
[im 47/78  brain]
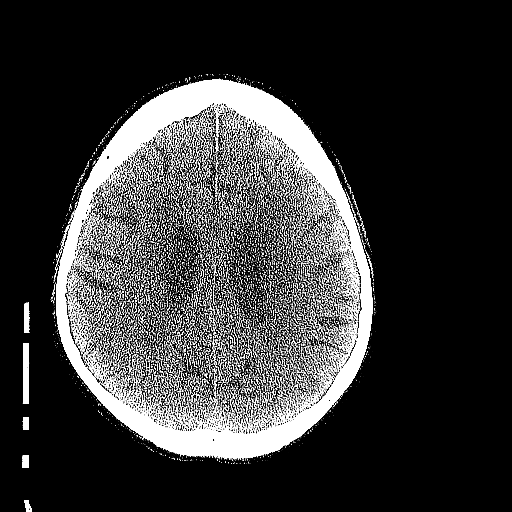
[im 54/78  brain]
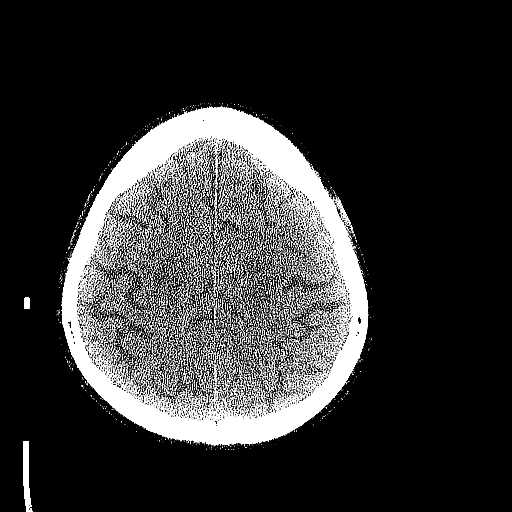
[im 62/78  brain]
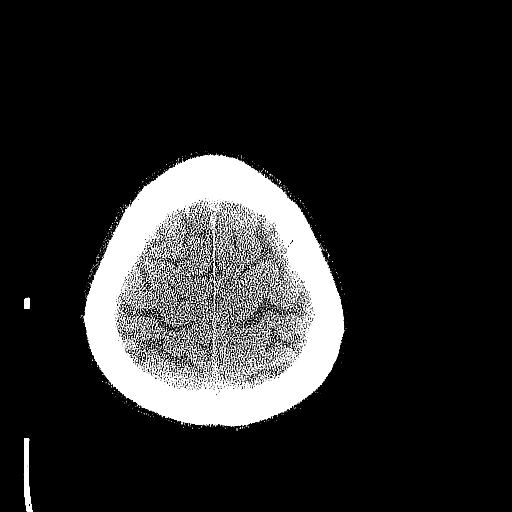
[im 70/78  brain]
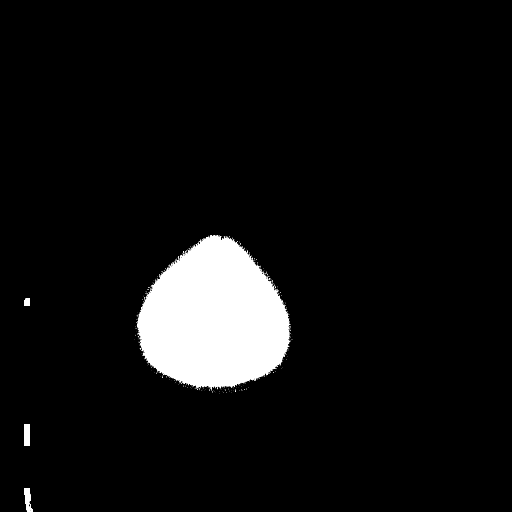
[im 70/78  bone]
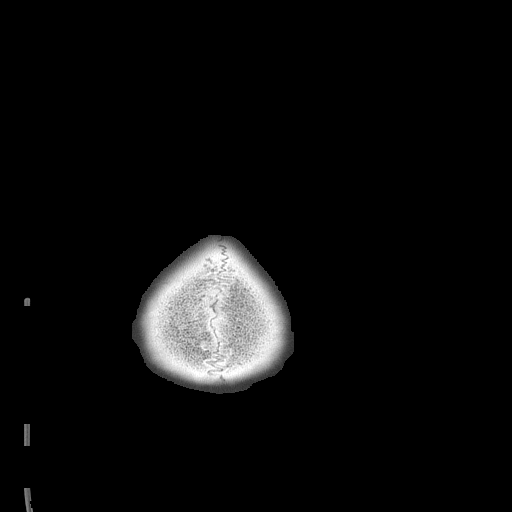

[Series 5: head 3.0 mpr cor · coronal · 0.33mm/px · 3 of 67 slices shown]
[im 23/67  brain]
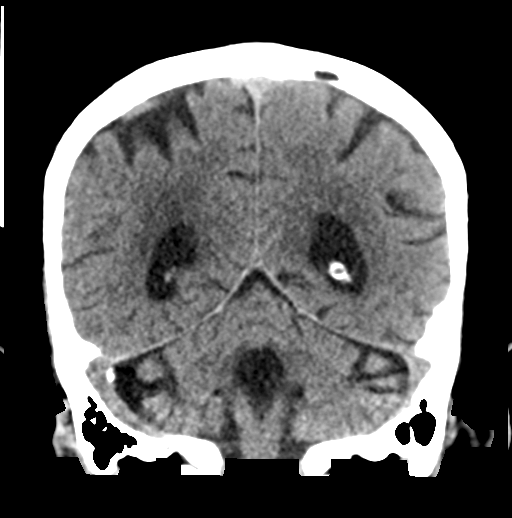
[im 30/67  brain]
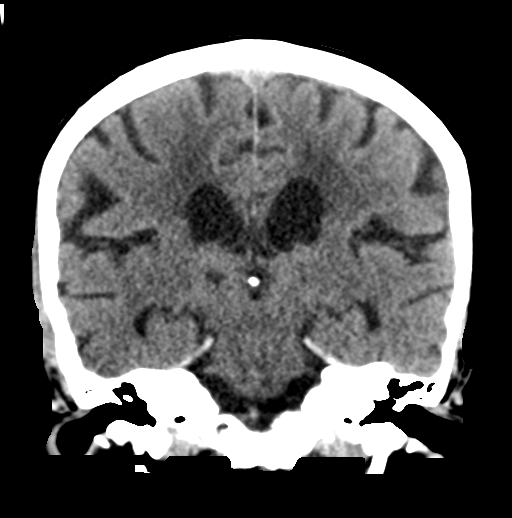
[im 37/67  brain]
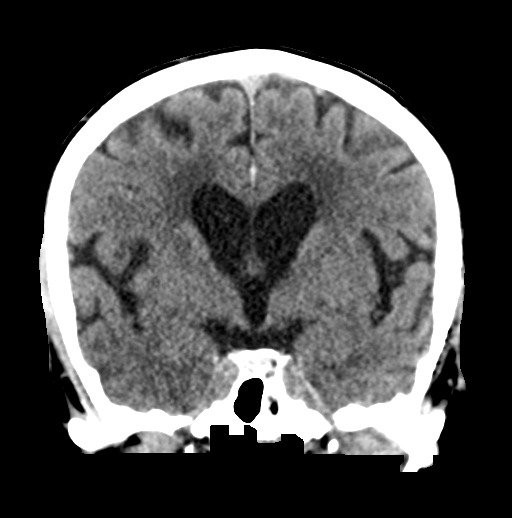

[Series 6: head 3.0 mpr sag · sagittal · 0.34mm/px · 3 of 56 slices shown]
[im 19/56  brain]
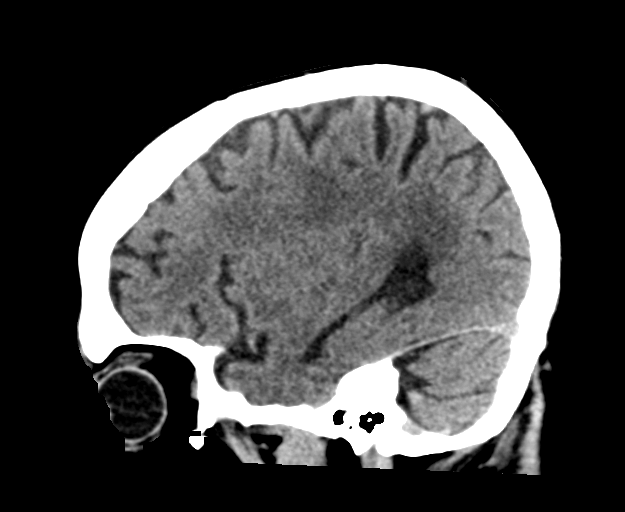
[im 28/56  brain]
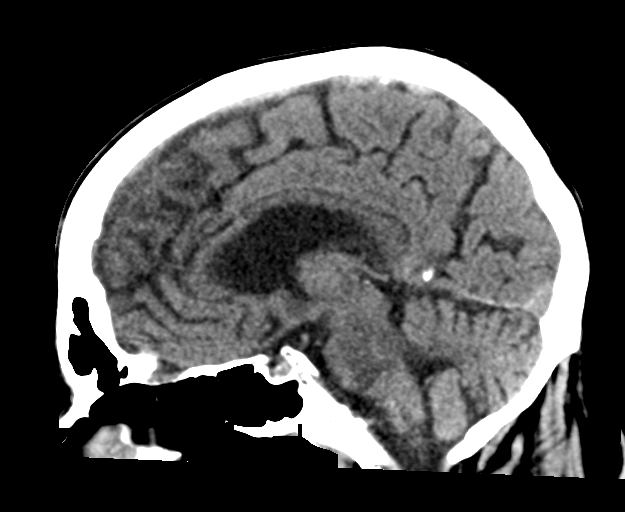
[im 37/56  brain]
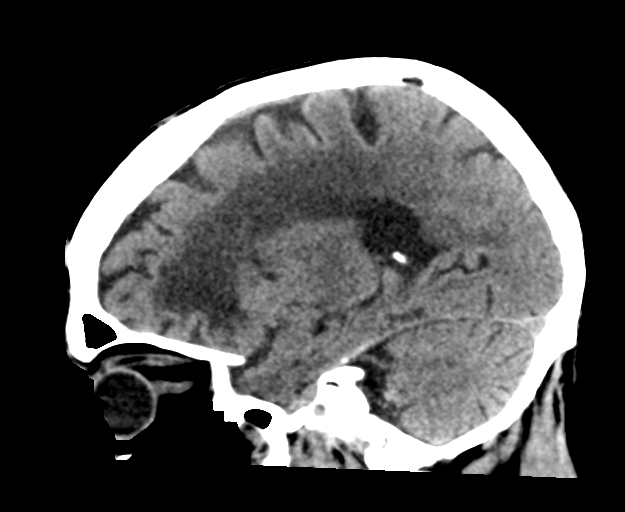

[15 of 47 positions shown; findings below may reference images not displayed]

BRAIN:
BRAIN
Cerebral ventricle sizes are concordant with the degree of cerebral
volume loss. Patchy and confluent areas of decreased attenuation are
noted throughout the deep and periventricular white matter of the
cerebral hemispheres bilaterally, compatible with chronic
microvascular ischemic disease. Bilateral thalami chronic lacunar
infarctions. Chronic left basal ganglia lacunar infarction.

No evidence of large-territorial acute infarction. No parenchymal
hemorrhage. No mass lesion. No extra-axial collection.

No mass effect or midline shift. No hydrocephalus. Basilar cisterns
are patent.

Vascular: No hyperdense vessel.

Skull: No acute fracture or focal lesion. Left temporal craniotomy
surgical changes.

Sinuses/Orbits: Paranasal sinuses and mastoid air cells are clear.
The orbits are unremarkable.

Other: None.
IMPRESSION: No acute intracranial abnormality in a patient with chronic
microvascular ischemic changes and chronic lacunar infarctions.

## 2022-01-24 MED ORDER — POTASSIUM CHLORIDE CRYS ER 20 MEQ PO TBCR
40.0000 meq | EXTENDED_RELEASE_TABLET | Freq: Once | ORAL | Status: AC
  Administered 2022-01-24: 40 meq via ORAL
  Filled 2022-01-24: qty 2

## 2022-01-24 MED ORDER — HYDRALAZINE HCL 20 MG/ML IJ SOLN
5.0000 mg | INTRAMUSCULAR | Status: DC | PRN
Start: 2022-01-24 — End: 2022-01-26
  Administered 2022-01-25 – 2022-01-26 (×2): 5 mg via INTRAVENOUS
  Filled 2022-01-24 (×2): qty 1

## 2022-01-24 MED ORDER — SODIUM CHLORIDE 0.9 % IV BOLUS
1000.0000 mL | Freq: Once | INTRAVENOUS | Status: AC
  Administered 2022-01-24: 1000 mL via INTRAVENOUS

## 2022-01-24 MED ORDER — ENOXAPARIN SODIUM 40 MG/0.4ML IJ SOSY
40.0000 mg | PREFILLED_SYRINGE | INTRAMUSCULAR | Status: DC
  Administered 2022-01-24 – 2022-01-25 (×2): 40 mg via SUBCUTANEOUS
  Filled 2022-01-24 (×2): qty 0.4

## 2022-01-24 MED ORDER — THIAMINE HCL 100 MG/ML IJ SOLN: 100.0000 mg | Freq: Every day | INTRAMUSCULAR | Status: DC

## 2022-01-24 MED ORDER — LORAZEPAM 1 MG PO TABS: 0.0000 mg | ORAL_TABLET | Freq: Four times a day (QID) | ORAL | Status: DC

## 2022-01-24 MED ORDER — LORAZEPAM 1 MG PO TABS: 0.0000 mg | ORAL_TABLET | Freq: Two times a day (BID) | ORAL | Status: DC

## 2022-01-24 MED ORDER — ADULT MULTIVITAMIN W/MINERALS CH
1.0000 | ORAL_TABLET | Freq: Every day | ORAL | Status: DC
  Administered 2022-01-25 – 2022-01-26 (×2): 1 via ORAL
  Filled 2022-01-24 (×2): qty 1

## 2022-01-24 MED ORDER — LORAZEPAM 2 MG/ML IJ SOLN: 1.0000 mg | INTRAMUSCULAR | Status: DC | PRN

## 2022-01-24 MED ORDER — THIAMINE HCL 100 MG/ML IJ SOLN
500.0000 mg | Freq: Once | INTRAVENOUS | Status: AC
  Administered 2022-01-24: 500 mg via INTRAVENOUS
  Filled 2022-01-24: qty 5

## 2022-01-24 MED ORDER — SODIUM CHLORIDE 0.9 % IV SOLN: INTRAVENOUS | Status: AC

## 2022-01-24 MED ORDER — LORAZEPAM 1 MG PO TABS: 1.0000 mg | ORAL_TABLET | ORAL | Status: DC | PRN

## 2022-01-24 MED ORDER — FOLIC ACID 1 MG PO TABS
1.0000 mg | ORAL_TABLET | Freq: Every day | ORAL | Status: DC
  Administered 2022-01-25 – 2022-01-26 (×2): 1 mg via ORAL
  Filled 2022-01-24 (×2): qty 1

## 2022-01-24 MED ORDER — MAGNESIUM SULFATE 2 GM/50ML IV SOLN
2.0000 g | Freq: Once | INTRAVENOUS | Status: AC
  Administered 2022-01-24: 2 g via INTRAVENOUS
  Filled 2022-01-24: qty 50

## 2022-01-24 MED ORDER — THIAMINE HCL 100 MG PO TABS: 100.0000 mg | ORAL_TABLET | Freq: Every day | ORAL | Status: DC

## 2022-01-24 NOTE — ED Provider Triage Note (Signed)
Emergency Medicine Provider Triage Evaluation Note  Kenneth [name] , a 61 y.o. male  was evaluated in triage.  Pt complains of dizziness.  Patient states that he has been feeling dizzy for about 4 years now.  Apparently has acutely worsened over the past couple of days.  He has had frequent falls and experiencing lower extremity weakness with gait imbalance.  He says that the dizziness is only with exertion when he is walking around and is not present at rest.  He describes it as room spinning.  He is accompanied by his sister who states that he is a fairly heavy drinker.  Review of Systems  Positive: Dizziness, gait abnormalities Negative:   Physical Exam  BP 97/69 (BP Location: Right Arm)    Pulse (!) 101    Temp 98.6 F (37 C) (Oral)    Resp 18    Ht 5\' 4"  (1.626 m)    Wt 66 kg    SpO2 93%    BMI 24.98 kg/m  Gen:   Awake, no distress   Resp:  Normal effort  MSK:   Moves extremities without difficulty  Other:  Finger to nose and heel to shin intact. EOM intact. PERRLA. Strength equal in all ext.   Medical Decision Making  Medically screening exam initiated at 4:32 PM.  Appropriate orders placed.  Kenneth [name] was informed that the remainder of the evaluation will be completed by another provider, this initial triage assessment does not replace that evaluation, and the importance of remaining in the ED until their evaluation is complete.     Kenneth [name], [place] 01/24/22 [phone]

## 2022-01-24 NOTE — ED Notes (Signed)
Patient transported to MRI 

## 2022-01-24 NOTE — H&P (Signed)
History and Physical    Kenneth [name] CVE:[id number] DOB: 1961-10-30 DOA: 01/24/2022  PCP: Isaac Bliss, Rayford Halsted, MD  Patient coming from: Home.  Chief Complaint: Dizziness and fall.  HPI: Kenneth [name] is a 61 y.o. male with history of hypertension, hyperlipidemia, diabetes mellitus and medically melanoma treated surgically at [facility] who has been not following with his primary care physician almost for 3 years and had last visit in December 2022 when patient was restarted on his antihypertensives at that time patient also indicated pain in his both lower extremities which is burning in sensation and also lost 60 pounds of weight presents to the ER after patient has been having multiple falls with dizziness over the last few days.  Today patient had 2 falls 1 when he was trying to walk another 1 when he was try to get up from the commode.  Did not hit his head or lose consciousness.  Patient's history also provided the history states that there has been no running water in his house for many days now.  ED Course: In the ER patient is mildly tremulous but able to move all extremities 5 x 5.  Has some skin changes in the lower extremities.  No facial admitted tongue is midline CT head is unremarkable.  Neurology is recommending getting MRI brain which showed acute infarct of the right thalamus and left pons.  Patient did pass stroke swallow.  In addition patient labs show acute renal failure with creatinine of 1.8 and sodium of 120 low magnesium and potassium.  Patient also was in the mild hypertensive range for which patient was given fluid bolus.  Patient admitted for acute CVA hyponatremia acute renal failure and poor social condition.  Review of Systems: As per HPI, rest all negative.   Past Medical History:  Diagnosis Date   Cancer [facility])    metastatic melanoma- spread to lungs, two "spots" on brain per xray- last chemo tx in 2014, September- "all clear", no tx now   Diabetes  mellitus without complication (Kenneth [name])    Hyperlipidemia    Hypertension     Past Surgical History:  Procedure Laterality Date   BRAIN SURGERY  2015   to check for possible malignancy from melanoma, result were scar tissue   melanoma exicison     right leg     reports that he has never smoked. His smokeless tobacco use includes snuff. He reports that he does not drink alcohol and does not use drugs.  Allergies  Allergen Reactions   Cat Hair Extract Shortness Of Breath and Swelling    Swelling, watery eyes    Family History  Problem Relation Age of Onset   Prostate cancer Father    Stomach cancer Paternal Grandmother    Colon cancer Neg Hx    Esophageal cancer Neg Hx    Rectal cancer Neg Hx     Prior to Admission medications   Medication Sig Start Date End Date Taking? Authorizing Provider  lisinopril-hydrochlorothiazide (ZESTORETIC) 20-12.5 MG tablet Take 2 tablets by mouth daily. 11/11/21  Yes Isaac Bliss, Rayford Halsted, MD  amLODipine (NORVASC) 10 MG tablet Take 1 tablet (10 mg total) by mouth daily. Patient not taking: Reported on 11/11/2021 12/06/18   Isaac Bliss, Rayford Halsted, MD  atorvastatin (LIPITOR) 20 MG tablet Take 1 tablet (20 mg total) by mouth daily. Patient not taking: Reported on 11/11/2021 01/23/18   Marletta Lor, MD  glimepiride (AMARYL) 2 MG tablet TAKE 1/2 TO 1  TABLET BY MOUTH EVERY DAY WITH BREAKFAST Patient not taking: Reported on 11/11/2021 12/19/17   Marletta Lor, MD  metFORMIN (GLUCOPHAGE) 1000 MG tablet TAKE 1 TABLET BY MOUTH TWICE A DAY WITH A MEAL Patient not taking: Reported on 11/11/2021 08/23/18   Marletta Lor, MD  metoprolol tartrate (LOPRESSOR) 100 MG tablet Take 1 tablet (100 mg total) by mouth 2 (two) times daily. Patient not taking: Reported on 11/11/2021 09/24/17   Marletta Lor, MD  pioglitazone (ACTOS) 45 MG tablet Take 1 tablet (45 mg total) by mouth daily. Patient not taking: Reported on 11/11/2021 12/06/18    Isaac Bliss, Rayford Halsted, MD  potassium chloride SA (KLOR-CON M) 20 MEQ tablet Take 2 tablets (40 mEq total) by mouth daily for 3 days. Patient not taking: Reported on 01/24/2022 11/24/21 11/27/21  Isaac Bliss, Rayford Halsted, MD    Physical Exam: Constitutional: Moderately built and nourished. Vitals:   01/24/22 1628 01/24/22 1629 01/24/22 1824 01/24/22 1830  BP: 97/69  102/66 100/65  Pulse: (!) 101  90 91  Resp: 18  18   Temp: 98.6 F (37 C)     TempSrc: Oral     SpO2: 93%  100% 99%  Weight:  66 kg    Height:  5\' 4"  (1.626 m)     Eyes: Anicteric no pallor. ENMT: No discharge from the ears eyes nose and mouth. Neck: No mass felt.  No neck rigidity. Respiratory: No rhonchi or crepitations. Cardiovascular: S1-S2 heard. Abdomen: Soft nontender bowel sound present. Musculoskeletal: No edema. Skin: Chronic skin changes of the lower extremities. Neurologic: Alert awake oriented to time place and person.  Moving all extremities 5 x 5.  No facial asymmetry tongue is midline pupils are equal and reacting to light.  Patient appears tremulous. Psychiatric: Denies any suicidal ideation.   Labs on Admission: I have personally reviewed following labs and imaging studies  CBC: Recent Labs  Lab 01/24/22 1636  WBC 11.9*  NEUTROABS 10.3*  HGB 14.1  HCT 37.7*  MCV 84.7  PLT 409   Basic Metabolic Panel: Recent Labs  Lab 01/24/22 1636 01/24/22 1747  NA 120*  --   K 3.0*  --   CL 77*  --   CO2 28  --   GLUCOSE 219*  --   BUN 21*  --   CREATININE 1.81*  --   CALCIUM 9.1  --   MG  --  1.5*   GFR: Estimated Creatinine Clearance: 36.3 mL/min (A) (by C-G formula based on SCr of 1.81 mg/dL (H)). Liver Function Tests: Recent Labs  Lab 01/24/22 1636  AST 18  ALT 15  ALKPHOS 56  BILITOT 1.2  PROT 6.3*  ALBUMIN 3.5   No results for input(s): LIPASE, AMYLASE in the last 168 hours. No results for input(s): AMMONIA in the last 168 hours. Coagulation Profile: No results for  input(s): INR, PROTIME in the last 168 hours. Cardiac Enzymes: No results for input(s): CKTOTAL, CKMB, CKMBINDEX, TROPONINI in the last 168 hours. BNP (last 3 results) No results for input(s): PROBNP in the last 8760 hours. HbA1C: No results for input(s): HGBA1C in the last 72 hours. CBG: No results for input(s): GLUCAP in the last 168 hours. Lipid Profile: No results for input(s): CHOL, HDL, LDLCALC, TRIG, CHOLHDL, LDLDIRECT in the last 72 hours. Thyroid Function Tests: No results for input(s): TSH, T4TOTAL, FREET4, T3FREE, THYROIDAB in the last 72 hours. Anemia Panel: Recent Labs    01/24/22 1747  VITAMINB12 231  FOLATE 10.7   Urine analysis:    Component Value Date/Time   BILIRUBINUR n 03/20/2016 1017   PROTEINUR 1+ 03/20/2016 1017   UROBILINOGEN 0.2 03/20/2016 1017   NITRITE n 03/20/2016 1017   LEUKOCYTESUR Negative 03/20/2016 1017   Sepsis Labs: @LABRCNTIP (procalcitonin:4,lacticidven:4) ) Recent Results (from the past 240 hour(s))  Resp Panel by RT-PCR (Flu A&B, Covid) Nasopharyngeal Swab     Status: None   Collection Time: 01/24/22  4:21 PM   Specimen: Nasopharyngeal Swab; Nasopharyngeal(NP) swabs in vial transport medium  Result Value Ref Range Status   SARS Coronavirus 2 by RT PCR NEGATIVE NEGATIVE Final    Comment: (NOTE) SARS-CoV-2 target nucleic acids are NOT DETECTED.  The SARS-CoV-2 RNA is generally detectable in upper respiratory specimens during the acute phase of infection. The lowest concentration of SARS-CoV-2 viral copies this assay can detect is 138 copies/mL. A negative result does not preclude SARS-Cov-2 infection and should not be used as the sole basis for treatment or other patient management decisions. A negative result may occur with  improper specimen collection/handling, submission of specimen other than nasopharyngeal swab, presence of viral mutation(s) within the areas targeted by this assay, and inadequate number of viral copies(<138  copies/mL). A negative result must be combined with clinical observations, patient history, and epidemiological information. The expected result is Negative.  Fact Sheet for Patients:  EntrepreneurPulse.com.au  Fact Sheet for Healthcare Providers:  IncredibleEmployment.be  This test is no t yet approved or cleared by the [place] FDA and  has been authorized for detection and/or diagnosis of SARS-CoV-2 by FDA under an Emergency Use Authorization (EUA). This EUA will remain  in effect (meaning this test can be used) for the duration of the COVID-19 declaration under Section 564(b)(1) of the Act, 21 U.S.C.section 360bbb-3(b)(1), unless the authorization is terminated  or revoked sooner.       Influenza A by PCR NEGATIVE NEGATIVE Final   Influenza B by PCR NEGATIVE NEGATIVE Final    Comment: (NOTE) The Xpert Xpress SARS-CoV-2/FLU/RSV plus assay is intended as an aid in the diagnosis of influenza from Nasopharyngeal swab specimens and should not be used as a sole basis for treatment. Nasal washings and aspirates are unacceptable for Xpert Xpress SARS-CoV-2/FLU/RSV testing.  Fact Sheet for Patients: EntrepreneurPulse.com.au  Fact Sheet for Healthcare Providers: IncredibleEmployment.be  This test is not yet approved or cleared by the [place] FDA and has been authorized for detection and/or diagnosis of SARS-CoV-2 by FDA under an Emergency Use Authorization (EUA). This EUA will remain in effect (meaning this test can be used) for the duration of the COVID-19 declaration under Section 564(b)(1) of the Act, 21 U.S.C. section 360bbb-3(b)(1), unless the authorization is terminated or revoked.  Performed at [facility], [place] [address], [place], [place] [address]      Radiological Exams on Admission: CT Head Wo Contrast  Result Date: 01/24/2022 CLINICAL DATA:  Headache, chronic, new features  or increased frequency EXAM: CT HEAD WITHOUT CONTRAST TECHNIQUE: Contiguous axial images were obtained from the base of the skull through the vertex without intravenous contrast. RADIATION DOSE REDUCTION: This exam was performed according to the departmental dose-optimization program which includes automated exposure control, adjustment of the mA and/or kV according to patient size and/or use of iterative reconstruction technique. COMPARISON:  None. BRAIN: BRAIN Cerebral ventricle sizes are concordant with the degree of cerebral volume loss. Patchy and confluent areas of decreased attenuation are noted throughout the deep and periventricular white matter of the  cerebral hemispheres bilaterally, compatible with chronic microvascular ischemic disease. Bilateral thalami chronic lacunar infarctions. Chronic left basal ganglia lacunar infarction. No evidence of large-territorial acute infarction. No parenchymal hemorrhage. No mass lesion. No extra-axial collection. No mass effect or midline shift. No hydrocephalus. Basilar cisterns are patent. Vascular: No hyperdense vessel. Skull: No acute fracture or focal lesion. Left temporal craniotomy surgical changes. Sinuses/Orbits: Paranasal sinuses and mastoid air cells are clear. The orbits are unremarkable. Other: None. IMPRESSION: No acute intracranial abnormality in a patient with chronic microvascular ischemic changes and chronic lacunar infarctions. Electronically Signed   By: Iven Finn M.D.   On: 01/24/2022 18:36    EKG: Independently reviewed.  Sinus tachycardia.  Assessment/Plan Principal Problem:   Dizziness Active Problems:   Essential hypertension   Metastatic melanoma to lung (HCC)   Hypokalemia   ARF (acute renal failure) (HCC)   Hyponatremia    Acute CVA -discussed with neurologist on-call Dr. Amie Portland who advised getting CT angiogram of the head and neck which has been ordered.  Check 2D echo.  Patient did pass stroke swallow.  Patient  be kept on neurochecks get hemoglobin A1c lipid panel physical therapy consult.  Patient will be on aspirin Plavix and will need statins after lipid panel. Dizziness likely precipitated by deconditioning low blood pressure stroke.  We will get physical therapy consult. Polyneuropathy for which neurologist has recommended getting SPEP, TSH, B12, folate, thiamine levels.  High-dose thiamine has been ordered. Alcohol abuse last drink was this afternoon.  We will keep patient on CIWA protocol advised about quitting alcohol.  Thiamine. Acute renal failure likely from poor oral intake and in addition patient also was hypotensive and also was using lisinopril and hydrochlorothiazide.  We will hold off lisinopril hydrochlorothiazide hydrate.  And check UA.  Follow intake output and metabolic panel. Hyponatremia could be multifactorial including use of hydrochlorothiazide also beer potomania.  However since patient is hypotensive we will continue with hydration and check urine studies including urine sodium osmolality TSH cortisol levels. Diabetes mellitus type 2 has not been taking medicines for many months now.  We will check hemoglobin A1c keep patient on sliding scale coverage. History of metastatic melanoma has had surgical treatment previously.  Will need follow-up with oncologist. 60 pound weight loss over the last many months.  We will get dietitian consult.  Will need follow-up with oncologist.  Chest x-ray is pending. Poor social condition -patient has no water electricity at home.  We will get social work consult. Hypomagnesemia and hyponatremia could be due to poor oral intake and also diuretic use.  Replace recheck.  Holding hydrochlorothiazide.   DVT prophylaxis: Lovenox. Code Status: Full code. Family Communication: Patient's sister at the bedside. Disposition Plan: To be determined. Consults called: Neurologist and social work. Admission status: Observation.   Rise Patience  MD [facility] Pager [phone].  If 7PM-7AM, please contact night-coverage [email or web address] Password [facility]  01/24/2022, 9:45 PM

## 2022-01-24 NOTE — ED Provider Notes (Signed)
[facility] EMERGENCY DEPARTMENT Provider Note   CSN: [id number] Arrival date & time: 01/24/22  1621     History  Chief Complaint  Patient presents with   Kenneth [name] is a 61 y.o. male.  He has a history of metastatic melanoma to lung and brain.  He is also a daily drinker.  He is here with dizziness intermittent for the last few weeks.  Causing him multiple falls.  Feels his unbalanced.  He has not followed up with his cancer doctors for over 3 years.  He has been losing over 60 pounds.  Numbness in his feet up to his knees for a while.  No headache blurry vision double vision.  No chest pain shortness of breath abdominal pain vomiting or diarrhea.  The history is provided by the patient and a relative.  Dizziness Quality:  Imbalance Severity:  Moderate Onset quality:  Gradual Duration:  2 weeks Timing:  Intermittent Progression:  Unchanged Chronicity:  New Relieved by:  Nothing Worsened by:  Nothing Ineffective treatments:  None tried Associated symptoms: no chest pain, no diarrhea, no headaches, no hearing loss, no nausea, no shortness of breath, no syncope, no tinnitus, no vision changes and no vomiting       Home Medications Prior to Admission medications   Medication Sig Start Date End Date Taking? Authorizing Provider  amLODipine (NORVASC) 10 MG tablet Take 1 tablet (10 mg total) by mouth daily. Patient not taking: Reported on 11/11/2021 12/06/18   Isaac Bliss, Rayford Halsted, MD  atorvastatin (LIPITOR) 20 MG tablet Take 1 tablet (20 mg total) by mouth daily. Patient not taking: Reported on 11/11/2021 01/23/18   Marletta Lor, MD  glimepiride (AMARYL) 2 MG tablet TAKE 1/2 TO 1 TABLET BY MOUTH EVERY DAY WITH BREAKFAST Patient not taking: Reported on 11/11/2021 12/19/17   Marletta Lor, MD  lisinopril-hydrochlorothiazide (ZESTORETIC) 20-12.5 MG tablet Take 2 tablets by mouth daily. 11/11/21   Isaac Bliss, Rayford Halsted, MD  metFORMIN  (GLUCOPHAGE) 1000 MG tablet TAKE 1 TABLET BY MOUTH TWICE A DAY WITH A MEAL Patient not taking: Reported on 11/11/2021 08/23/18   Marletta Lor, MD  metoprolol tartrate (LOPRESSOR) 100 MG tablet Take 1 tablet (100 mg total) by mouth 2 (two) times daily. Patient not taking: Reported on 11/11/2021 09/24/17   Marletta Lor, MD  MULTIPLE VITAMIN PO Take 1 tablet by mouth daily. Patient not taking: Reported on 11/11/2021    [provider]  naproxen sodium (ALEVE) 220 MG tablet Take 220 mg by mouth as needed.  Patient not taking: Reported on 11/11/2021    [provider]  pioglitazone (ACTOS) 45 MG tablet Take 1 tablet (45 mg total) by mouth daily. Patient not taking: Reported on 11/11/2021 12/06/18   Isaac Bliss, Rayford Halsted, MD  potassium chloride SA (KLOR-CON M) 20 MEQ tablet Take 2 tablets (40 mEq total) by mouth daily for 3 days. 11/24/21 11/27/21  Erline Hau, MD      Allergies    Cat hair extract    Review of Systems   Review of Systems  Constitutional:  Positive for unexpected weight change. Negative for fever.  HENT:  Negative for hearing loss and tinnitus.   Eyes:  Negative for visual disturbance.  Respiratory:  Negative for shortness of breath.   Cardiovascular:  Negative for chest pain and syncope.  Gastrointestinal:  Negative for diarrhea, nausea and vomiting.  Genitourinary:  Negative for dysuria.  Musculoskeletal:  Positive for gait problem. Negative for neck pain.  Skin:  Negative for pallor.  Neurological:  Positive for dizziness and numbness. Negative for headaches.   Physical Exam Updated Vital Signs BP 97/69 (BP Location: Right Arm)    Pulse (!) 101    Temp 98.6 F (37 C) (Oral)    Resp 18    Ht 5\' 4"  (1.626 m)    Wt 66 kg    SpO2 93%    BMI 24.98 kg/m  Physical Exam Vitals and nursing note reviewed.  Constitutional:      General: He is not in acute distress.    Appearance: Normal appearance. He is well-developed.  HENT:      Head: Normocephalic and atraumatic.  Eyes:     Conjunctiva/sclera: Conjunctivae normal.  Cardiovascular:     Rate and Rhythm: Normal rate and regular rhythm.     Heart sounds: No murmur heard. Pulmonary:     Effort: Pulmonary effort is normal. No respiratory distress.     Breath sounds: Normal breath sounds.  Abdominal:     Palpations: Abdomen is soft.     Tenderness: There is no abdominal tenderness.  Musculoskeletal:        General: No swelling. Normal range of motion.     Cervical back: Neck supple.  Skin:    General: Skin is warm and dry.     Capillary Refill: Capillary refill takes less than 2 seconds.  Neurological:     General: No focal deficit present.     Mental Status: He is alert and oriented to person, place, and time.     Cranial Nerves: No cranial nerve deficit.     Sensory: Sensory deficit present.     Motor: No weakness.     Comments: Stocking glove decree sensation lower extremities below knee bilateral    ED Results / Procedures / Treatments   Labs (all labs ordered are listed, but only abnormal results are displayed) Labs Reviewed  COMPREHENSIVE METABOLIC PANEL - Abnormal; Notable for the following components:      Result Value   Sodium 120 (*)    Potassium 3.0 (*)    Chloride 77 (*)    Glucose, Bld 219 (*)    BUN 21 (*)    Creatinine, Ser 1.81 (*)    Total Protein 6.3 (*)    GFR, Estimated 42 (*)    All other components within normal limits  CBC WITH DIFFERENTIAL/PLATELET - Abnormal; Notable for the following components:   WBC 11.9 (*)    HCT 37.7 (*)    MCHC 37.4 (*)    Neutro Abs 10.3 (*)    Abs Immature Granulocytes 0.09 (*)    All other components within normal limits  MAGNESIUM - Abnormal; Notable for the following components:   Magnesium 1.5 (*)    All other components within normal limits  CBC - Abnormal; Notable for the following components:   HCT 37.6 (*)    MCHC 36.2 (*)    All other components within normal limits  BASIC  METABOLIC PANEL - Abnormal; Notable for the following components:   Sodium 122 (*)    Potassium 3.0 (*)    Chloride 83 (*)    Glucose, Bld 173 (*)    Creatinine, Ser 1.57 (*)    Calcium 8.5 (*)    GFR, Estimated 50 (*)    All other components within normal limits  OSMOLALITY, URINE - Abnormal; Notable for the following components:   Osmolality,  Ur 261 (*)    All other components within normal limits  RESP PANEL BY RT-PCR (FLU A&B, COVID) ARPGX2  VITAMIN B12  FOLATE  ETHANOL  CORTISOL  SODIUM, URINE, RANDOM  TSH  VITAMIN B1  HIV ANTIBODY (ROUTINE TESTING W REFLEX)  BASIC METABOLIC PANEL  BASIC METABOLIC PANEL  BASIC METABOLIC PANEL  CBC WITH DIFFERENTIAL/PLATELET  METHYLMALONIC ACID, SERUM  HOMOCYSTEINE  RPR  HEMOGLOBIN A1C  MAGNESIUM    EKG EKG Interpretation  Date/Time:  Tuesday January 24 2022 16:30:55 EST Ventricular Rate:  105 PR Interval:  178 QRS Duration: 92 QT Interval:  368 QTC Calculation: 486 R Axis:   88 Text Interpretation: Sinus tachycardia Otherwise normal ECG No previous ECGs available Confirmed by Aletta Edouard [phone]) on 01/24/2022 5:08:14 PM  Radiology CT Head Wo Contrast  Result Date: 01/24/2022 CLINICAL DATA:  Headache, chronic, new features or increased frequency EXAM: CT HEAD WITHOUT CONTRAST TECHNIQUE: Contiguous axial images were obtained from the base of the skull through the vertex without intravenous contrast. RADIATION DOSE REDUCTION: This exam was performed according to the departmental dose-optimization program which includes automated exposure control, adjustment of the mA and/or kV according to patient size and/or use of iterative reconstruction technique. COMPARISON:  None. BRAIN: BRAIN Cerebral ventricle sizes are concordant with the degree of cerebral volume loss. Patchy and confluent areas of decreased attenuation are noted throughout the deep and periventricular white matter of the cerebral hemispheres bilaterally, compatible with  chronic microvascular ischemic disease. Bilateral thalami chronic lacunar infarctions. Chronic left basal ganglia lacunar infarction. No evidence of large-territorial acute infarction. No parenchymal hemorrhage. No mass lesion. No extra-axial collection. No mass effect or midline shift. No hydrocephalus. Basilar cisterns are patent. Vascular: No hyperdense vessel. Skull: No acute fracture or focal lesion. Left temporal craniotomy surgical changes. Sinuses/Orbits: Paranasal sinuses and mastoid air cells are clear. The orbits are unremarkable. Other: None. IMPRESSION: No acute intracranial abnormality in a patient with chronic microvascular ischemic changes and chronic lacunar infarctions. Electronically Signed   By: Iven Finn M.D.   On: 01/24/2022 18:36    Procedures .Critical Care Performed by: Hayden Rasmussen, MD Authorized by: Hayden Rasmussen, MD   Critical care provider statement:    Critical care time (minutes):  45   Critical care time was exclusive of:  Separately billable procedures and treating other patients   Critical care was necessary to treat or prevent imminent or life-threatening deterioration of the following conditions:  CNS failure or compromise and metabolic crisis   Critical care was time spent personally by me on the following activities:  Development of treatment plan with patient or surrogate, discussions with consultants, evaluation of patient's response to treatment, examination of patient, obtaining history from patient or surrogate, ordering and performing treatments and interventions, ordering and review of laboratory studies, ordering and review of radiographic studies, pulse oximetry, re-evaluation of patient's condition and review of old charts   I assumed direction of critical care for this patient from another provider in my specialty: no      Medications Ordered in ED Medications  enoxaparin (LOVENOX) injection 40 mg (40 mg Subcutaneous Given 01/24/22 2203)   0.9 %  sodium chloride infusion ( Intravenous New Bag/Given 01/24/22 2201)  hydrALAZINE (APRESOLINE) injection 5 mg (has no administration in time range)  LORazepam (ATIVAN) tablet 1-4 mg (has no administration in time range)    Or  LORazepam (ATIVAN) injection 1-4 mg (has no administration in time range)  thiamine tablet 100 mg (has  no administration in time range)    Or  thiamine (B-1) injection 100 mg (has no administration in time range)  folic acid (FOLVITE) tablet 1 mg (has no administration in time range)  multivitamin with minerals tablet 1 tablet (has no administration in time range)  LORazepam (ATIVAN) tablet 0-4 mg (0 mg Oral Not Given 01/24/22 2210)    Followed by  LORazepam (ATIVAN) tablet 0-4 mg (has no administration in time range)  thiamine 500mg  in normal saline (13ml) IVPB (0 mg Intravenous Stopped 01/24/22 1916)  sodium chloride 0.9 % bolus 1,000 mL (0 mLs Intravenous Stopped 01/24/22 2050)  potassium chloride SA (KLOR-CON M) CR tablet 40 mEq (40 mEq Oral Given 01/24/22 1949)  magnesium sulfate IVPB 2 g 50 mL (0 g Intravenous Stopped 01/24/22 2050)    ED Course/ Medical Decision Making/ A&P Clinical Course as of 01/24/22 2309  Tue Jan 24, 2022  1940 Discussed with Dr. Hal Hope [facility] hospitalist who will evaluate the patient for admission. [MB]    Clinical Course User Index [MB] Hayden Rasmussen, MD                           Medical Decision Making Amount and/or Complexity of Data Reviewed Labs: ordered. Radiology: ordered.  Risk Prescription drug management. Decision regarding hospitalization.  This patient complains of unsteady gait and dizziness, frequent falls, weight loss; this involves an extensive number of treatment Options and is a complaint that carries with it a high risk of complications and morbidity. The differential includes recurrent malignancy, stroke, tumor, Korsakoff syndrome, metabolic derangement  I ordered, reviewed and interpreted labs,  which included CBC with normal white count stable hemoglobin, chemistries markedly low sodium and potassium elevated creatinine, low magnesium, TSH normal, alcohol level negative, COVID and flu negative I ordered medication IV fluids IV thiamine, IV magnesium and oral potassium and reviewed PMP when indicated. I ordered imaging studies which included CT head and I independently    visualized and interpreted imaging which showed no acute findings Additional history obtained from patient's sister Previous records obtained and reviewed in epic, his last visit with his treatment team does appear to be over 2 years ago I consulted Dr. Hal Hope [facility] hospitalist and discussed lab and imaging findings and discussed disposition.  Cardiac monitoring reviewed, patient in normal sinus rhythm Social determinants considered, patient in poor housing situation with no water, daily drinker of alcohol Critical Interventions: Initiation of IV fluids and repletion of electrolyte disturbance  After the interventions stated above, I reevaluated the patient and found patient to be hemodynamically stable in bed. Admission and further testing considered, he will need admission to the hospital for further repletion of his electrolytes and further work-up of his unsteady gait.  Likely multifactorial.  Probably will need advanced imaging.  Patient agreeable to admission.          Final Clinical Impression(s) / ED Diagnoses Final diagnoses:  Unsteady gait  Recurrent falls  Alcohol dependence with unspecified alcohol-induced disorder ([place])  Hyponatremia  Hypomagnesemia  Hypokalemia  AKI (acute kidney injury) ([place])    Rx / DC Orders ED Discharge Orders     None         Hayden Rasmussen, MD 01/24/22 2314

## 2022-01-24 NOTE — ED Triage Notes (Signed)
Pt arrives POV for eval of dizziness and increased falls. Pt reports increased falling over the last 2 years, but states over the last two weeks dizziness has been increasing and worse over the last few days in particular. States he falls on his buttocks when he does fall. Wife reports some facial trauma, but no thinners. Wife also reports some speech concerns, which she feels is thick/slurred which she feels is lately that way all the time. Wife reports heavy ETOH use

## 2022-01-24 NOTE — ED Notes (Signed)
Patient transported to CT 

## 2022-01-25 ENCOUNTER — Observation Stay (HOSPITAL_COMMUNITY): Payer: BC Managed Care – PPO

## 2022-01-25 ENCOUNTER — Observation Stay (HOSPITAL_BASED_OUTPATIENT_CLINIC_OR_DEPARTMENT_OTHER): Payer: BC Managed Care – PPO

## 2022-01-25 DIAGNOSIS — C7800 Secondary malignant neoplasm of unspecified lung: Secondary | ICD-10-CM

## 2022-01-25 DIAGNOSIS — F1029 Alcohol dependence with unspecified alcohol-induced disorder: Secondary | ICD-10-CM

## 2022-01-25 DIAGNOSIS — I639 Cerebral infarction, unspecified: Secondary | ICD-10-CM

## 2022-01-25 DIAGNOSIS — Z8673 Personal history of transient ischemic attack (TIA), and cerebral infarction without residual deficits: Secondary | ICD-10-CM | POA: Diagnosis not present

## 2022-01-25 DIAGNOSIS — Z85820 Personal history of malignant melanoma of skin: Secondary | ICD-10-CM | POA: Diagnosis not present

## 2022-01-25 DIAGNOSIS — I6389 Other cerebral infarction: Secondary | ICD-10-CM

## 2022-01-25 DIAGNOSIS — R296 Repeated falls: Secondary | ICD-10-CM

## 2022-01-25 DIAGNOSIS — R2681 Unsteadiness on feet: Secondary | ICD-10-CM

## 2022-01-25 DIAGNOSIS — I700 Atherosclerosis of aorta: Secondary | ICD-10-CM | POA: Diagnosis not present

## 2022-01-25 DIAGNOSIS — S069X0A Unspecified intracranial injury without loss of consciousness, initial encounter: Secondary | ICD-10-CM | POA: Diagnosis not present

## 2022-01-25 DIAGNOSIS — I6523 Occlusion and stenosis of bilateral carotid arteries: Secondary | ICD-10-CM | POA: Diagnosis not present

## 2022-01-25 DIAGNOSIS — I10 Essential (primary) hypertension: Secondary | ICD-10-CM

## 2022-01-25 DIAGNOSIS — I6782 Cerebral ischemia: Secondary | ICD-10-CM | POA: Diagnosis not present

## 2022-01-25 LAB — BASIC METABOLIC PANEL
Anion gap: 11 (ref 5–15)
Anion gap: 9 (ref 5–15)
Anion gap: 11 (ref 5–15)
Anion gap: 7 (ref 5–15)
BUN: 17 mg/dL (ref 6–20)
BUN: 16 mg/dL (ref 6–20)
BUN: 15 mg/dL (ref 6–20)
BUN: 14 mg/dL (ref 6–20)
CO2: 27 mmol/L (ref 22–32)
CO2: 30 mmol/L (ref 22–32)
CO2: 30 mmol/L (ref 22–32)
CO2: 30 mmol/L (ref 22–32)
Calcium: 8 mg/dL — ABNORMAL LOW (ref 8.9–10.3)
Calcium: 8.7 mg/dL — ABNORMAL LOW (ref 8.9–10.3)
Calcium: 9 mg/dL (ref 8.9–10.3)
Calcium: 9 mg/dL (ref 8.9–10.3)
Chloride: 87 mmol/L — ABNORMAL LOW (ref 98–111)
Chloride: 83 mmol/L — ABNORMAL LOW (ref 98–111)
Chloride: 85 mmol/L — ABNORMAL LOW (ref 98–111)
Chloride: 90 mmol/L — ABNORMAL LOW (ref 98–111)
Creatinine, Ser: 1.38 mg/dL — ABNORMAL HIGH (ref 0.61–1.24)
Creatinine, Ser: 1.46 mg/dL — ABNORMAL HIGH (ref 0.61–1.24)
Creatinine, Ser: 1.39 mg/dL — ABNORMAL HIGH (ref 0.61–1.24)
Creatinine, Ser: 1.36 mg/dL — ABNORMAL HIGH (ref 0.61–1.24)
GFR, Estimated: 59 mL/min — ABNORMAL LOW (ref >60)
GFR, Estimated: 55 mL/min — ABNORMAL LOW (ref >60)
GFR, Estimated: 58 mL/min — ABNORMAL LOW (ref >60)
GFR, Estimated: 60 mL/min — ABNORMAL LOW (ref >60)
Glucose, Bld: 122 mg/dL — ABNORMAL HIGH (ref 70–99)
Glucose, Bld: 186 mg/dL — ABNORMAL HIGH (ref 70–99)
Glucose, Bld: 216 mg/dL — ABNORMAL HIGH (ref 70–99)
Glucose, Bld: 148 mg/dL — ABNORMAL HIGH (ref 70–99)
Potassium: 3 mmol/L — ABNORMAL LOW (ref 3.5–5.1)
Potassium: 3.2 mmol/L — ABNORMAL LOW (ref 3.5–5.1)
Potassium: 3.7 mmol/L (ref 3.5–5.1)
Potassium: 4.2 mmol/L (ref 3.5–5.1)
Sodium: 125 mmol/L — ABNORMAL LOW (ref 135–145)
Sodium: 122 mmol/L — ABNORMAL LOW (ref 135–145)
Sodium: 126 mmol/L — ABNORMAL LOW (ref 135–145)
Sodium: 127 mmol/L — ABNORMAL LOW (ref 135–145)

## 2022-01-25 LAB — ECHOCARDIOGRAM COMPLETE
AV Mean grad: 2 mmHg
AV Peak grad: 4.1 mmHg
Ao pk vel: 1.01 m/s
Area-P 1/2: 3.81 cm2
Calc EF: 71.7 %
Height: 64 in
S' Lateral: 2.5 cm
Single Plane A2C EF: 76.1 %
Single Plane A4C EF: 67.6 %
Weight: 2328.06 oz

## 2022-01-25 LAB — CBC WITH DIFFERENTIAL/PLATELET
Abs Immature Granulocytes: 0.04 10*3/uL (ref 0.00–0.07)
Basophils Absolute: 0 10*3/uL (ref 0.0–0.1)
Basophils Relative: 1 %
Eosinophils Absolute: 0.1 10*3/uL (ref 0.0–0.5)
Eosinophils Relative: 1 %
HCT: 35.3 % — ABNORMAL LOW (ref 39.0–52.0)
Hemoglobin: 12.5 g/dL — ABNORMAL LOW (ref 13.0–17.0)
Immature Granulocytes: 1 %
Lymphocytes Relative: 21 %
Lymphs Abs: 1.3 10*3/uL (ref 0.7–4.0)
MCH: 30.9 pg (ref 26.0–34.0)
MCHC: 35.4 g/dL (ref 30.0–36.0)
MCV: 87.4 fL (ref 80.0–100.0)
Monocytes Absolute: 0.7 10*3/uL (ref 0.1–1.0)
Monocytes Relative: 11 %
Neutro Abs: 4 10*3/uL (ref 1.7–7.7)
Neutrophils Relative %: 65 %
Platelets: 282 10*3/uL (ref 150–400)
RBC: 4.04 MIL/uL — ABNORMAL LOW (ref 4.22–5.81)
RDW: 11.6 % (ref 11.5–15.5)
WBC: 6.1 10*3/uL (ref 4.0–10.5)
nRBC: 0 % (ref 0.0–0.2)

## 2022-01-25 LAB — LIPID PANEL
Cholesterol: 116 mg/dL (ref 0–200)
HDL: 60 mg/dL (ref >40)
LDL Cholesterol: 47 mg/dL (ref 0–99)
Total CHOL/HDL Ratio: 1.9 RATIO
Triglycerides: 46 mg/dL (ref <150)
VLDL: 9 mg/dL (ref 0–40)

## 2022-01-25 LAB — RAPID URINE DRUG SCREEN, HOSP PERFORMED
Amphetamines: NOT DETECTED
Barbiturates: NOT DETECTED
Benzodiazepines: NOT DETECTED
Cocaine: NOT DETECTED
Opiates: NOT DETECTED
Tetrahydrocannabinol: NOT DETECTED

## 2022-01-25 LAB — MAGNESIUM: Magnesium: 1.9 mg/dL (ref 1.7–2.4)

## 2022-01-25 LAB — GLUCOSE, CAPILLARY
Glucose-Capillary: 99 mg/dL (ref 70–99)
Glucose-Capillary: 180 mg/dL — ABNORMAL HIGH (ref 70–99)
Glucose-Capillary: 244 mg/dL — ABNORMAL HIGH (ref 70–99)
Glucose-Capillary: 151 mg/dL — ABNORMAL HIGH (ref 70–99)

## 2022-01-25 LAB — HEMOGLOBIN A1C
Hgb A1c MFr Bld: 6.6 % — ABNORMAL HIGH (ref 4.8–5.6)
Mean Plasma Glucose: 143 mg/dL

## 2022-01-25 LAB — RPR: RPR Ser Ql: NONREACTIVE

## 2022-01-25 IMAGING — CT CT ANGIO HEAD-NECK (W OR W/O PERF)
2 of 7 series · 8 of 33 positions shown · non-contrast
Comparison: No prior CTA, correlation is made with CT and MRI head
[DATE]

CLINICAL DATA: Stroke/TIA evaluate for embolic source

EXAM:
CT ANGIOGRAPHY HEAD AND NECK
TECHNIQUE: Multidetector CT imaging of the head and neck was performed using
the standard protocol during bolus administration of intravenous
contrast. Multiplanar CT image reconstructions and MIPs were
obtained to evaluate the vascular anatomy. Carotid stenosis
measurements (when applicable) are obtained utilizing NASCET
criteria, using the distal internal carotid diameter as the
denominator.

[Series 5: cta neck/head · axial · 0.61mm/px · z∈[-167,-59]mm · 2 of 163 slices shown]
[im 55/163  soft-tissue]
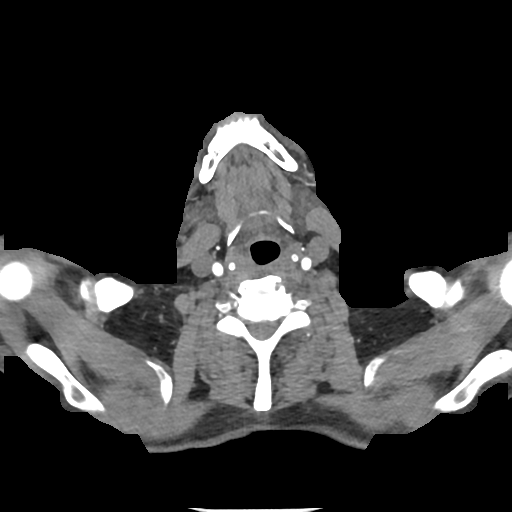
[im 109/163  soft-tissue]
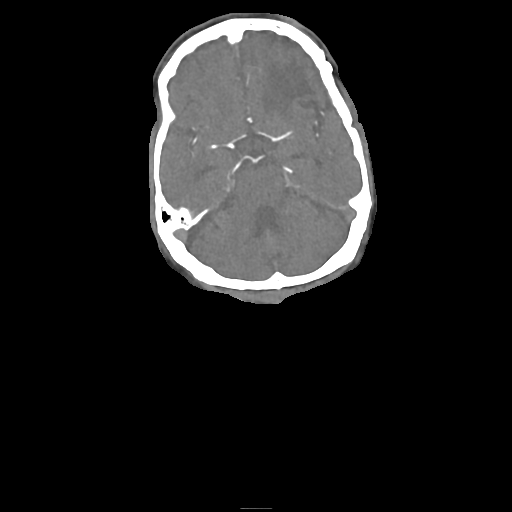

[Series 7: ax thins · axial · 0.39mm/px · z∈[-262,-45]mm · 6 of 326 slices shown]
[im 47/326  soft-tissue]
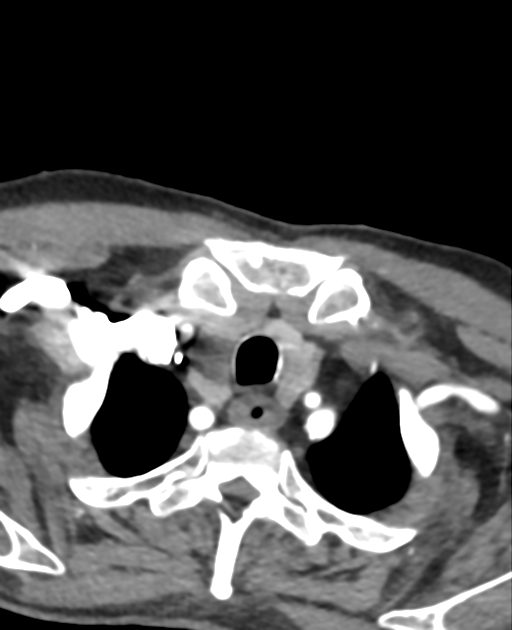
[im 93/326  bone]
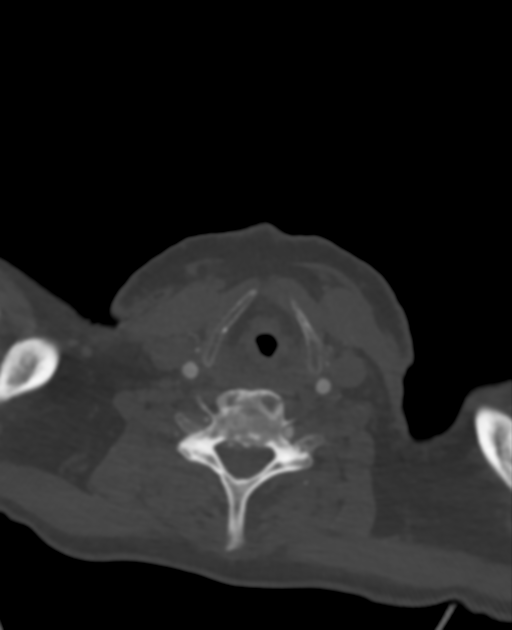
[im 140/326  soft-tissue]
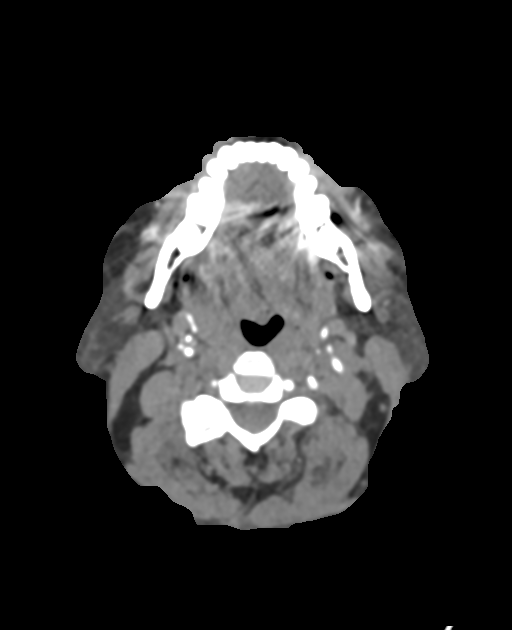
[im 186/326  bone]
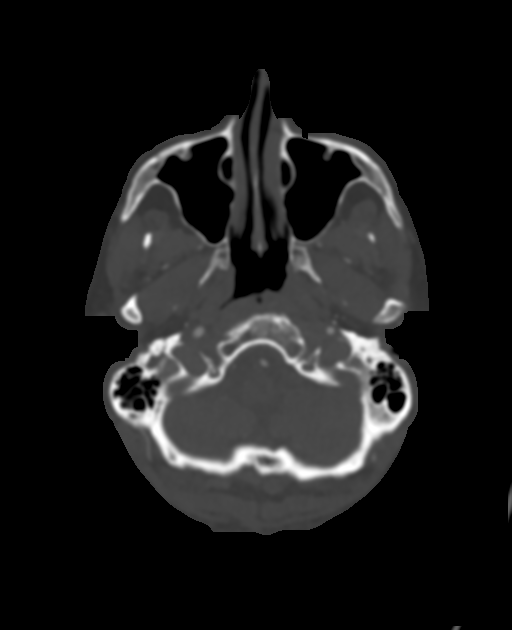
[im 233/326  soft-tissue]
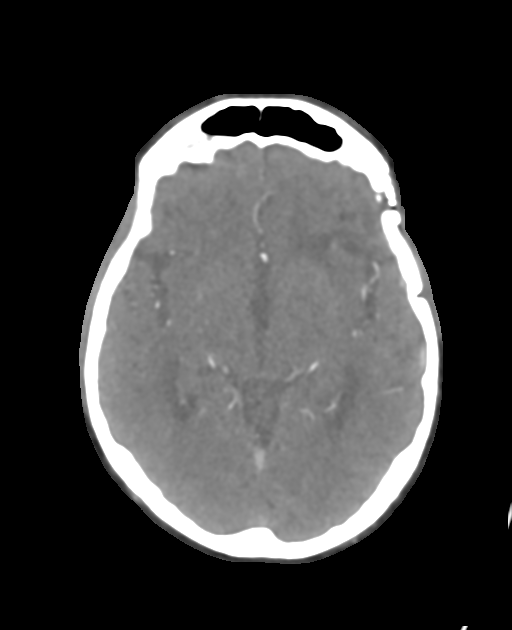
[im 279/326  bone]
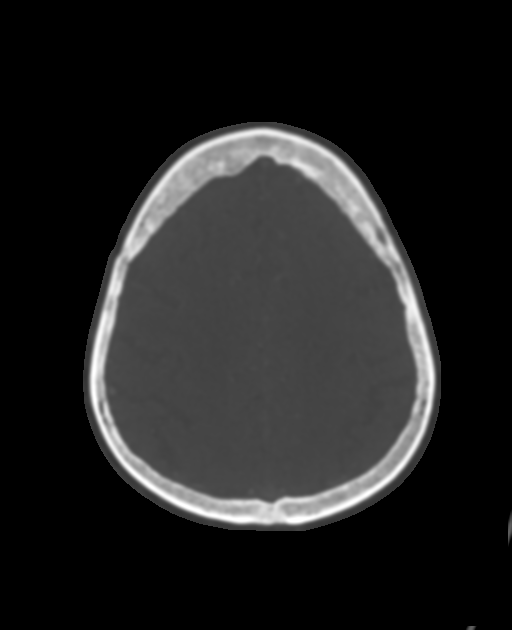

[8 of 33 positions shown; findings below may reference images not displayed]

RADIATION DOSE REDUCTION: This exam was performed according to the
departmental dose-optimization program which includes automated
exposure control, adjustment of the mA and/or kV according to
patient size and/or use of iterative reconstruction technique.

CONTRAST:  75mL OMNIPAQUE IOHEXOL 350 MG/ML SOLN
FINDINGS: CT HEAD FINDINGS

For noncontrast findings, please see [DATE] CT head.

CTA NECK FINDINGS

Aortic arch: 4 vessel arch with aberrant origin of the right
subclavian artery, which passes posterior to the esophagus. Imaged
portion shows no evidence of aneurysm or dissection. No significant
stenosis of the major arch vessel origins. Aortic atherosclerosis.

Right carotid system: No evidence of dissection, stenosis (50% or
greater) or occlusion.

Left carotid system: No evidence of dissection, stenosis (50% or
greater) or occlusion.

Vertebral arteries: No evidence of dissection, stenosis (50% or
greater) or occlusion. Left dominant system with quite diminutive
right vertebral artery for the entirety of its course.

Skeleton: No acute osseous abnormality. Straightening and mild
reversal of the normal cervical lordosis.

Other neck: Negative.

Upper chest: Negative.

Review of the MIP images confirms the above findings

CTA HEAD FINDINGS

Anterior circulation: Both internal carotid arteries are patent to
the termini, without significant stenosis.

A1 segments patent. Normal anterior communicating artery. Anterior
cerebral arteries are patent to their distal aspects.

No M1 stenosis or occlusion. Normal MCA bifurcations. Distal MCA
branches perfused and symmetric.

Posterior circulation: Vertebral arteries patent to the
vertebrobasilar junction without stenosis. Posterior inferior
cerebral arteries patent bilaterally.

Basilar patent to its distal aspect. Superior cerebellar arteries
patent bilaterally.

Patent P1 segments. PCAs perfused to their distal aspects without
stenosis. The bilateral posterior communicating arteries are
visualized.

Venous sinuses: As permitted by contrast timing, patent.

Anatomic variants: None significant

Review of the MIP images confirms the above findings
IMPRESSION: 1.  No intracranial large vessel occlusion or significant stenosis.
2.  No hemodynamically significant stenosis in the neck.
3. Incidental note is made of an aberrant origin of the right
subclavian artery.

## 2022-01-25 IMAGING — MR MR HEAD W/ CM
4 series · 19 of 48 positions shown · IV contrast (Yes   MULTIHANCE)
Comparison: Noncontrast brain MRI from yesterday

CLINICAL DATA: Assess for metastatic disease. History of metastatic
melanoma.

EXAM:
MRI HEAD WITH CONTRAST
TECHNIQUE: Multiplanar, multiecho pulse sequences of the brain and surrounding
structures were obtained with intravenous contrast.
CONTRAST:  6mL GADAVIST GADOBUTROL 1 MMOL/ML IV SOLN

[Series 3: T2 post-contrast · coronal · 5.0mm · 0.39mm/px · 3 of 25 slices shown]
[im 3/25]
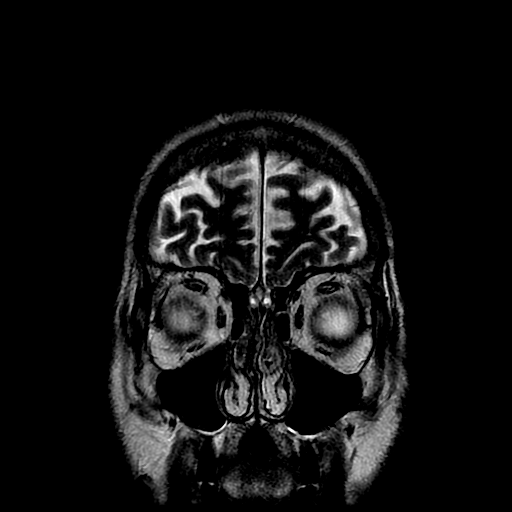
[im 14/25]
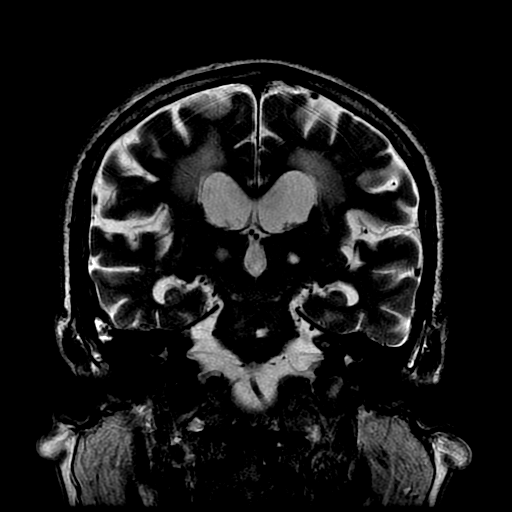
[im 22/25]
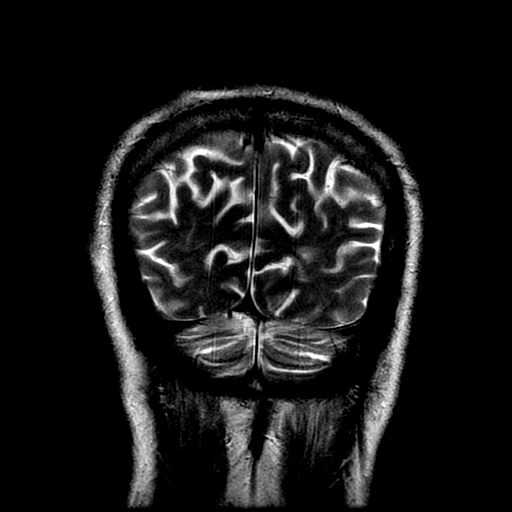

[Series 4: T1 post-contrast · axial · 3.0mm · 0.47mm/px · z∈[-49,+77]mm · 9 of 48 slices shown (1 of 3)]
[im 3/48]
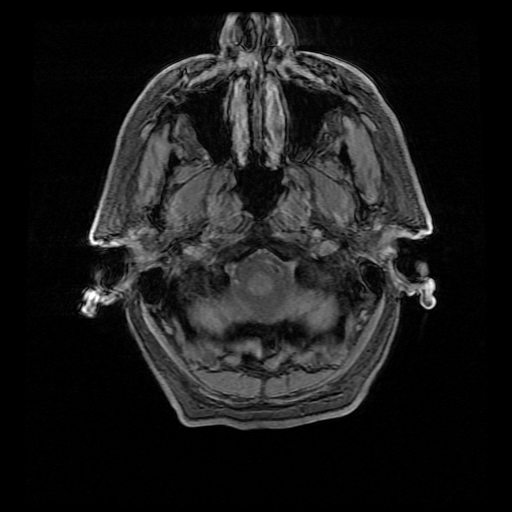
[im 8/48]
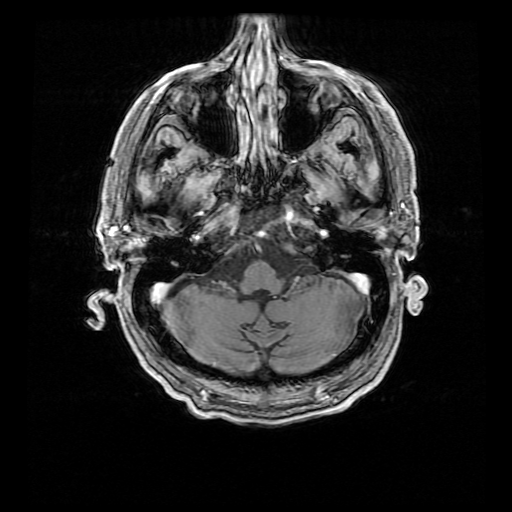
[im 14/48]
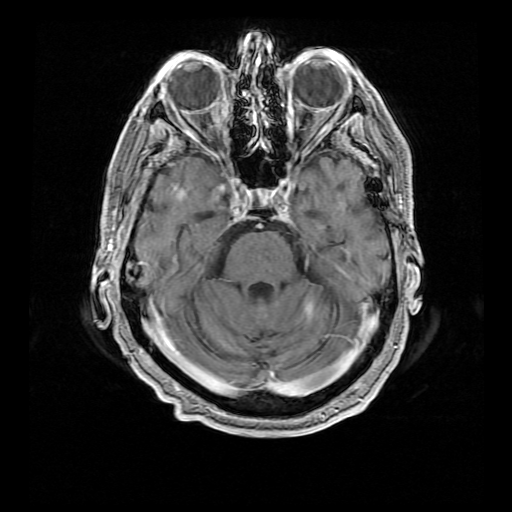
[im 21/48]
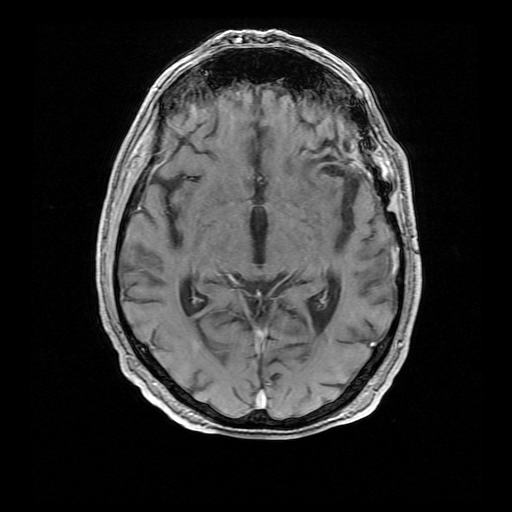
[im 24/48]
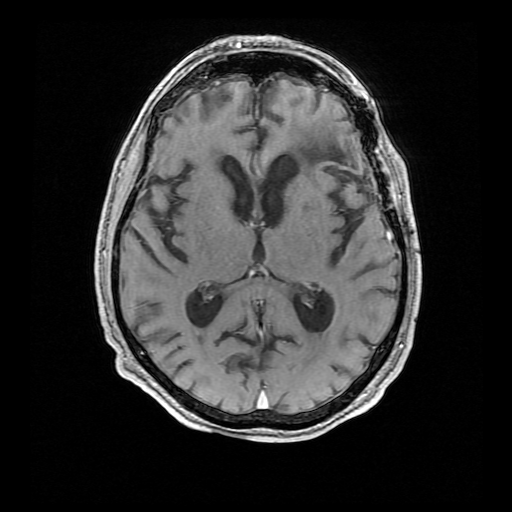
[im 27/48]
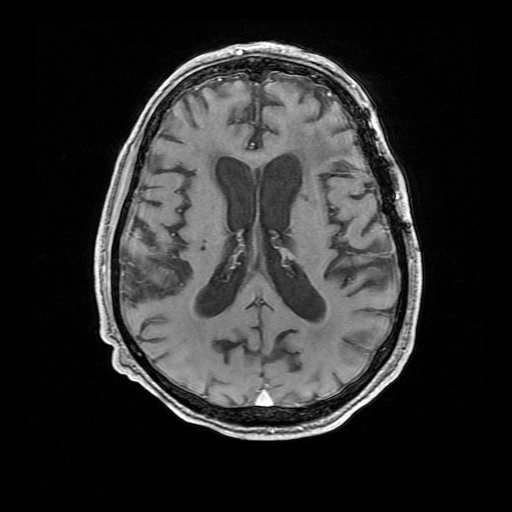
[im 34/48]
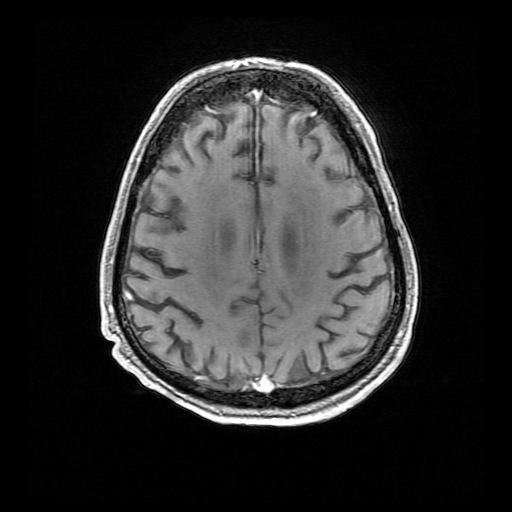
[im 40/48]
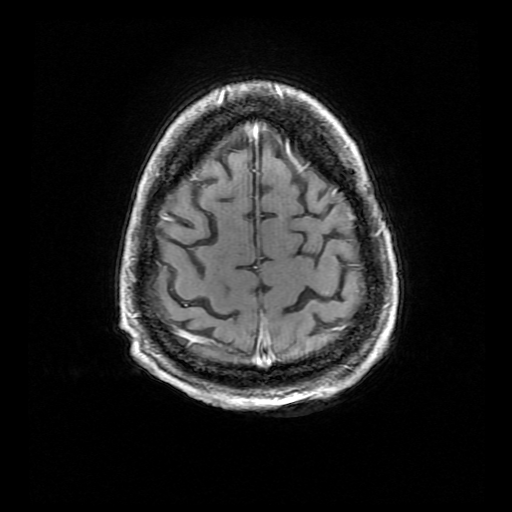
[im 45/48]
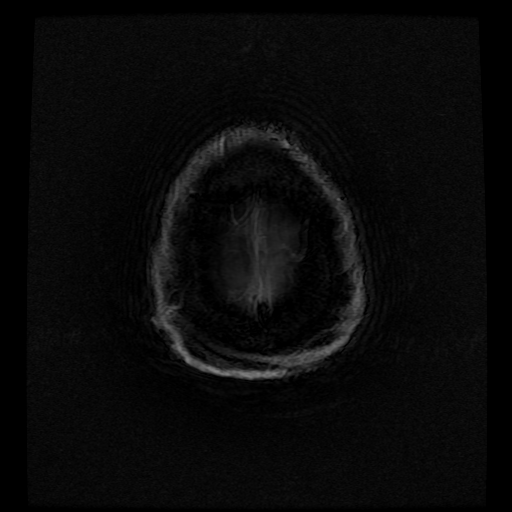

[Series 5: T1 post-contrast · coronal · 5.0mm · 0.39mm/px · 4 of 25 slices shown (2 of 3)]
[im 1/25]
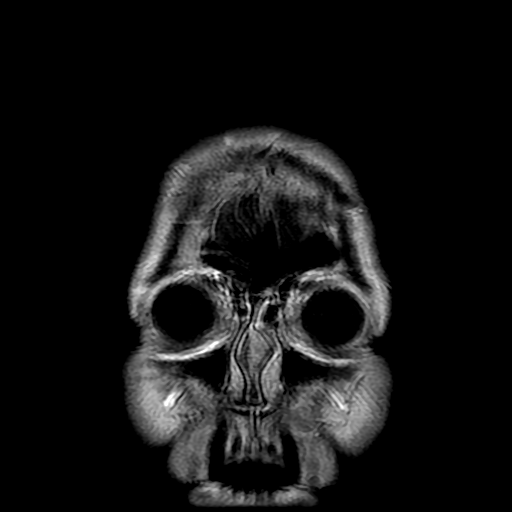
[im 3/25]
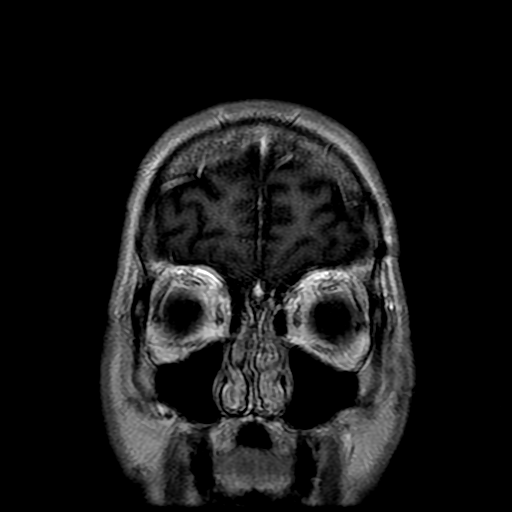
[im 14/25]
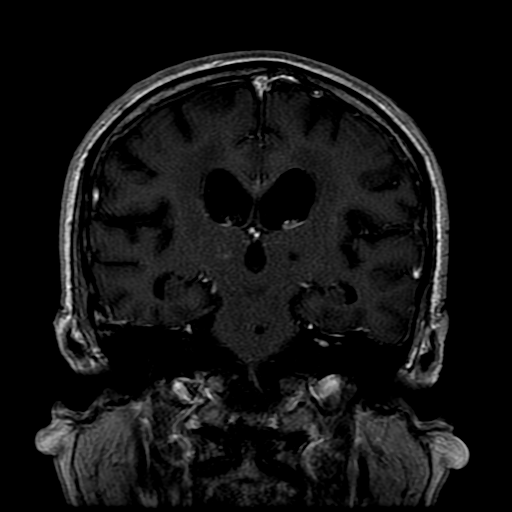
[im 22/25]
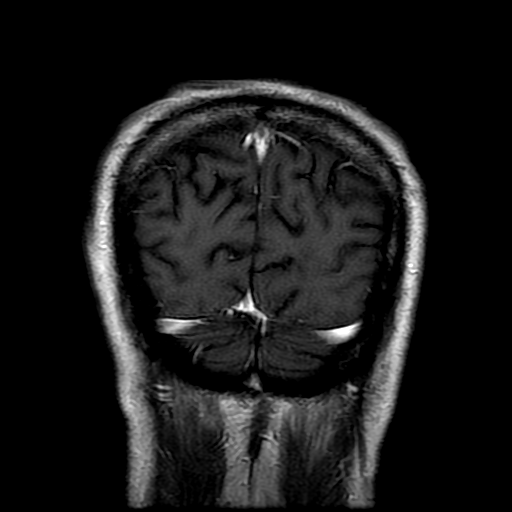

[Series 6: T1 post-contrast · sagittal · 5.0mm · 0.47mm/px · 3 of 24 slices shown (3 of 3)]
[im 3/24]
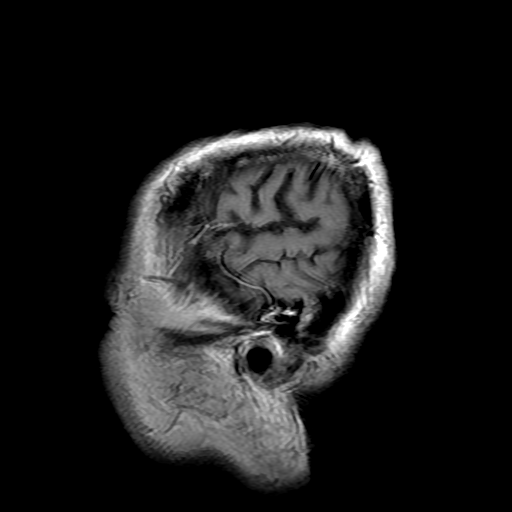
[im 12/24]
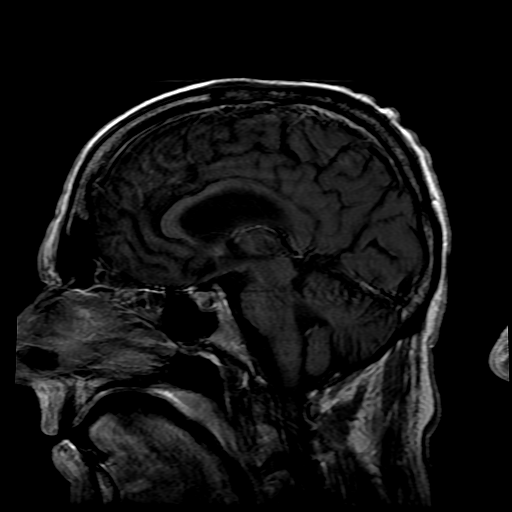
[im 21/24]
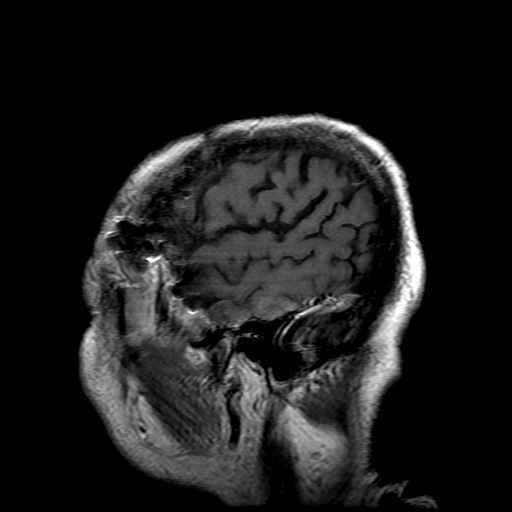

[19 of 48 positions shown; findings below may reference images not displayed]

FINDINGS: Brain: No enhancement to suggest metastatic disease. No worrisome
enhancement along the left craniotomy and frontal encephalomalacia.
Ischemic injuries as described on prior brain MRI.

Vascular: Unremarkable vascular enhancements

Skull and upper cervical spine: Unremarkable left craniotomy.

Sinuses/Orbits: Negative

Other: Diffuse mild motion artifact with image blurring.
IMPRESSION: Negative for metastatic disease to the brain.

## 2022-01-25 MED ORDER — ASPIRIN 300 MG RE SUPP: 300.0000 mg | Freq: Every day | RECTAL | Status: DC

## 2022-01-25 MED ORDER — ASPIRIN 325 MG PO TABS
325.0000 mg | ORAL_TABLET | Freq: Every day | ORAL | Status: DC
  Administered 2022-01-25: 325 mg via ORAL
  Filled 2022-01-25: qty 1

## 2022-01-25 MED ORDER — THIAMINE HCL 100 MG/ML IJ SOLN
500.0000 mg | Freq: Three times a day (TID) | INTRAVENOUS | Status: DC
  Administered 2022-01-25 – 2022-01-26 (×4): 500 mg via INTRAVENOUS
  Filled 2022-01-25 (×8): qty 5

## 2022-01-25 MED ORDER — STROKE: EARLY STAGES OF RECOVERY BOOK
Freq: Once | Status: AC
  Filled 2022-01-25 (×2): qty 1

## 2022-01-25 MED ORDER — POTASSIUM CHLORIDE CRYS ER 20 MEQ PO TBCR
40.0000 meq | EXTENDED_RELEASE_TABLET | Freq: Four times a day (QID) | ORAL | Status: AC
  Administered 2022-01-25 (×2): 40 meq via ORAL
  Filled 2022-01-25 (×2): qty 2

## 2022-01-25 MED ORDER — GADOBUTROL 1 MMOL/ML IV SOLN
6.0000 mL | Freq: Once | INTRAVENOUS | Status: AC | PRN
  Administered 2022-01-25: 6 mL via INTRAVENOUS

## 2022-01-25 MED ORDER — INSULIN ASPART 100 UNIT/ML IJ SOLN
0.0000 [IU] | Freq: Three times a day (TID) | INTRAMUSCULAR | Status: DC
  Administered 2022-01-25: 2 [IU] via SUBCUTANEOUS
  Administered 2022-01-25: 3 [IU] via SUBCUTANEOUS
  Administered 2022-01-26: 1 [IU] via SUBCUTANEOUS
  Administered 2022-01-26: 2 [IU] via SUBCUTANEOUS

## 2022-01-25 MED ORDER — CLOPIDOGREL BISULFATE 75 MG PO TABS
75.0000 mg | ORAL_TABLET | Freq: Every day | ORAL | Status: DC
  Administered 2022-01-25 – 2022-01-26 (×2): 75 mg via ORAL
  Filled 2022-01-25 (×3): qty 1

## 2022-01-25 MED ORDER — IOHEXOL 350 MG/ML SOLN
75.0000 mL | Freq: Once | INTRAVENOUS | Status: AC | PRN
  Administered 2022-01-25: 75 mL via INTRAVENOUS

## 2022-01-25 MED ORDER — ASPIRIN EC 81 MG PO TBEC
81.0000 mg | DELAYED_RELEASE_TABLET | Freq: Every day | ORAL | Status: DC
  Administered 2022-01-25 – 2022-01-26 (×2): 81 mg via ORAL
  Filled 2022-01-25 (×2): qty 1

## 2022-01-25 NOTE — TOC Initial Note (Signed)
Transition of Care [facility]) - Initial/Assessment Note    Patient Details  Name: Kenneth [name] MRN: [id number] Date of Birth: September 29, 1961  [facility] CM/SW Contact:    Pollie Friar, RN Phone Number: 01/25/2022, 2:33 PM  Clinical Narrative:                 Patient lives at home alone and per pt and sisters his home is not in good shape. He has no running water due to busted pipes and no heat and air. Per sisters the home needs a lot of repairs.  CM spoke to sister, [name] and patient is able to stay with her at discharge until they find him another place to live. Patient is also in agreement.  Pt has a walker at home but its a borrowed one. CM has ordered him a walker through [employer]. This will be delivered to his room.  Recommendations for home health services but his insurance is not in network with the [facility] agencies. CM spoke to [name] about outpatient therapy and she says the other sister, [name] can transport him to appointments.  TOC following.   Expected Discharge Plan: OP Rehab Barriers to Discharge: Continued Medical Work up   Patient Goals and CMS Choice     Choice offered to / list presented to : Patient  Expected Discharge Plan and Services Expected Discharge Plan: OP Rehab   Discharge Planning Services: CM Consult Post Acute Care Choice: Durable Medical Equipment Living arrangements for the past 2 months: Single Family Home                 DME Arranged: Walker rolling DME Agency: [employer] Date DME Agency Contacted: 01/25/22   Representative spoke with at DME Agency: Freda Munro            Prior Living Arrangements/Services Living arrangements for the past 2 months: Single Family Home Lives with:: Self Patient language and need for interpreter reviewed:: Yes Do you feel safe going back to the place where you live?: No   no heat/ air or water  Need for Family Participation in Patient Care: Yes (Comment) Care giver support system in place?: Yes  (comment) (intermittent supervision)   Criminal Activity/Legal Involvement Pertinent to Current Situation/Hospitalization: No - Comment as needed  Activities of Daily Living      Permission Sought/Granted                  Emotional Assessment Appearance:: Appears older than stated age Attitude/Demeanor/Rapport: Engaged Affect (typically observed): Accepting Orientation: : Oriented to Self, Oriented to Place, Oriented to  Time, Oriented to Situation Alcohol / Substance Use: Alcohol Use Psych Involvement: No (comment)  Admission diagnosis:  Hypokalemia [E87.6] Hypomagnesemia [E83.42] Dizziness [R42] Hyponatremia [E87.1] Unsteady gait [R26.81] Stroke (cerebrum) (HCC) [I63.9] AKI (acute kidney injury) ([place]) [N17.9] Recurrent falls [R29.6] Alcohol dependence with unspecified alcohol-induced disorder (HCC) [F10.29] Patient Active Problem List   Diagnosis Date Noted   Acute CVA (cerebrovascular accident) ([place]) 01/25/2022   Dizziness 01/24/2022   ARF (acute renal failure) ([place]) 01/24/2022   Hyponatremia 01/24/2022   Elevated PSA 11/24/2021   Hypokalemia 11/24/2021   Morbid obesity ([place]) 11/06/2018   Hx of colonic polyps 03/28/2017   Metastatic melanoma to lung ([place]) 12/12/2012   Diabetes mellitus without complication ([place]) 72/53/6644   Dyslipidemia 08/20/2007   Essential hypertension 08/19/2007   PCP:  Erline Hau, MD Pharmacy:   [id number] - [place], [place]  [address] [place] [address] Phone: [phone] Fax: [phone]     Social Determinants of Health ([place]) Interventions    Readmission Risk Interventions No flowsheet data found.

## 2022-01-25 NOTE — Evaluation (Signed)
Occupational Therapy Evaluation Patient Details Name: Kenneth [name] MRN: [id number] DOB: Apr 30, 1961 Today's Date: 01/25/2022   History of Present Illness Kenneth [name] is a 61 y.o. male who presented to the ED for evaluation prolonged course of feeling unwell and specifically with dizziness, imbalance and sustaining multiple falls. 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 Past medical history of metastatic melanoma to lung and brain, diabetes, hypertension, hyperlipidemia, alcohol abuse   Clinical Impression   Kenneth [name] was evaluated s/p the above admission list. He reports being generally mod I PTA with use of SPC at home and a RW at work, but states he has had several falls (most of which occur at home). He drives, and lives alone in a split level home with no running water. Upon evaluation pt was supervision for all sitting ADLs, and close min guard for all standing ADLs and ambulation in the hallway with RW. He is unsteady, and reported dizziness with some turns. He has poor insight to safety and deficits and benefit from cues. Pt will benefit from OT acutely to address the limitations below. Recommend d/c to home with 24/7 direct physical assist to prevent falls.      Recommendations for follow up therapy are one component of a multi-disciplinary discharge planning process, led by the attending physician.  Recommendations may be updated based on patient status, additional functional criteria and insurance authorization.   Follow Up Recommendations  Home health OT    Assistance Recommended at Discharge Frequent or constant Supervision/Assistance  Patient can return home with the following A little help with walking and/or transfers;A little help with bathing/dressing/bathroom;Direct supervision/assist for medications management;Assist for transportation;Help with stairs or ramp for entrance    Functional Status Assessment  Patient has had a recent decline in  their functional status and demonstrates the ability to make significant improvements in function in a reasonable and predictable amount of time.  Equipment Recommendations  Other (comment) (RW)       Precautions / Restrictions Precautions Precautions: Fall Restrictions Weight Bearing Restrictions: No      Mobility Bed Mobility Overal bed mobility: Modified Independent             General bed mobility comments: no physical assist, HOB slightly elevated    Transfers Overall transfer level: Needs assistance Equipment used: Rolling walker (2 wheels) Transfers: Sit to/from Stand Sit to Stand: Min guard                  Balance Overall balance assessment: Needs assistance, History of Falls Sitting-balance support: Feet supported Sitting balance-Leahy Scale: Good     Standing balance support: Single extremity supported, During functional activity Standing balance-Leahy Scale: Fair Standing balance comment: incr safety wtih BUE supported                           ADL either performed or assessed with clinical judgement   ADL Overall ADL's : Needs assistance/impaired Eating/Feeding: Independent;Sitting   Grooming: Supervision/safety;Standing   Upper Body Bathing: Set up;Sitting   Lower Body Bathing: Min guard;Sit to/from stand   Upper Body Dressing : Set up;Sitting   Lower Body Dressing: Min guard;Sit to/from stand Lower Body Dressing Details (indicate cue type and reason): able to don socks while long sitting in bed Toilet Transfer: Min guard;Rolling walker (2 wheels);Ambulation Toilet Transfer Details (indicate cue type and reason): close min G, unsteady with turns Toileting- Clothing Manipulation and Hygiene: Supervision/safety;Sitting/lateral lean  Functional mobility during ADLs: Min guard;Rolling walker (2 wheels) General ADL Comments: pt limited by unsteady gait, poor insight, poor safety awareness and dizziness     Vision  Baseline Vision/History: 1 Wears glasses Ability to See in Adequate Light: 0 Adequate Patient Visual Report: No change from baseline Vision Assessment?: Vision impaired- to be further tested in functional context Additional Comments: pt denies vision changes however he was noted to close one eye when looking at close objects (ex. his phone), & lifting his glasses in attempt to see better throughout the session            Pertinent Vitals/Pain Pain Assessment Pain Assessment: No/denies pain Pain Intervention(s): Monitored during session     Hand Dominance Right   Extremity/Trunk Assessment Upper Extremity Assessment Upper Extremity Assessment: Overall WFL for tasks assessed   Lower Extremity Assessment Lower Extremity Assessment: Defer to PT evaluation   Cervical / Trunk Assessment Cervical / Trunk Assessment: Normal   Communication Communication Communication: No difficulties   Cognition Arousal/Alertness: Awake/alert Behavior During Therapy: WFL for tasks assessed/performed Overall Cognitive Status: No family/caregiver present to determine baseline cognitive functioning           General Comments: overall WFL for functinoal tasks assesed. Limited insight to safety & deficits. Also giving conflicting report of signs and symptoms throughout     General Comments  VSS on RA     Home Living Family/patient expects to be discharged to:: Private residence Living Arrangements: Alone   Type of Home: House Home Access: Stairs to enter [employer] of Steps: 1 Entrance Stairs-Rails: None Home Layout: One level;Two level Alternate Level Stairs-Number of Steps: 5 (5 steps to drop level where pt stays) Alternate Level Stairs-Rails: Right Bathroom Shower/Tub: Teacher, early years/pre: Standard     Home Equipment: Cane - quad;Cane - single Barista (2 wheels)   Additional Comments: works as a Research scientist (physical sciences). Home does not currently have  working plumbing.  Lives With: Alone    Prior Functioning/Environment Prior Level of Function : Needs assist;Working/employed;Driving;History of Falls (last six months)       Physical Assist : Mobility (physical);ADLs (physical) Mobility (physical): Gait ADLs (physical): Bathing;Dressing Mobility Comments: uses SPC in the home, RW at work. reports multiple falls ADLs Comments: generally indep, drives & works. uses bucket of water from bathtub to toilet & sponge bathe (no plumbing)        OT Problem List: Decreased strength;Decreased activity tolerance;Decreased range of motion;Impaired balance (sitting and/or standing);Decreased safety awareness;Decreased knowledge of use of DME or AE;Decreased knowledge of precautions;Decreased cognition      OT Treatment/Interventions: Self-care/ADL training;Therapeutic exercise;Therapeutic activities;DME and/or AE instruction;Patient/family education;Balance training    OT Goals(Current goals can be found in the care plan section) Acute Rehab OT Goals Patient Stated Goal: home OT Goal Formulation: With patient Time For Goal Achievement: 01/25/22 Potential to Achieve Goals: Good ADL Goals Pt Will Perform Grooming: Independently;standing Pt Will Transfer to Toilet: Independently;ambulating Pt/caregiver will Perform Home Exercise Program: Increased strength;Both right and left upper extremity;With written HEP provided Additional ADL Goal #1: Pt will indep follow 3 step trail making task  OT Frequency: Min 2X/week    Co-evaluation PT/OT/SLP Co-Evaluation/Treatment: Yes Reason for Co-Treatment: For patient/therapist safety;To address functional/ADL transfers   OT goals addressed during session: ADL's and self-care      AM-PAC OT "6 Clicks" Daily Activity     Outcome Measure Help from another person eating meals?: None Help from another person taking care of personal  grooming?: A Little Help from another person toileting, which includes  using toliet, bedpan, or urinal?: A Little Help from another person bathing (including washing, rinsing, drying)?: A Little Help from another person to put on and taking off regular upper body clothing?: None Help from another person to put on and taking off regular lower body clothing?: A Little 6 Click Score: 20   End of Session Equipment Utilized During Treatment: Gait belt;Rolling walker (2 wheels) Nurse Communication: Mobility status  Activity Tolerance: Patient tolerated treatment well Patient left: in chair;with call bell/phone within reach;with chair alarm set  OT Visit Diagnosis: Unsteadiness on feet (R26.81);Other abnormalities of gait and mobility (R26.89);Repeated falls (R29.6);Muscle weakness (generalized) (M62.81);History of falling (Z91.81)                Time: 0601-5615 OT Time Calculation (min): 33 min Charges:  OT General Charges $OT Visit: 1 Visit OT Evaluation $OT Eval Moderate Complexity: 1 Mod   Lycia Sachdeva A Ryson Bacha 01/25/2022, 1:14 PM

## 2022-01-25 NOTE — Progress Notes (Signed)
Lower extremity venous has been completed.   Preliminary results in CV Proc.   Kenneth [name] Maye 01/25/2022 2:47 PM

## 2022-01-25 NOTE — Evaluation (Signed)
Speech Language Pathology Evaluation Patient Details Name: Kenneth [name] MRN: [id number] DOB: December 27, 1960 Today's Date: 01/25/2022 Time:  -     Problem List:  Patient Active Problem List   Diagnosis Date Noted   Acute CVA (cerebrovascular accident) (Kenneth [name]) 01/25/2022   Dizziness 01/24/2022   ARF (acute renal failure) (Kenneth [name]) 01/24/2022   Hyponatremia 01/24/2022   Elevated PSA 11/24/2021   Hypokalemia 11/24/2021   Morbid obesity (Kenneth [name]) 11/06/2018   Hx of colonic polyps 03/28/2017   Metastatic melanoma to lung (Kenneth [name]) 12/12/2012   Diabetes mellitus without complication (Kenneth [name]) 68/02/2121   Dyslipidemia 08/20/2007   Essential hypertension 08/19/2007   Past Medical History:  Past Medical History:  Diagnosis Date   Cancer ([name])    metastatic melanoma- spread to lungs, two "spots" on brain per xray- last chemo tx in 2014, September- "all clear", no tx now   Diabetes mellitus without complication (Kenneth [name])    Hyperlipidemia    Hypertension    Past Surgical History:  Past Surgical History:  Procedure Laterality Date   BRAIN SURGERY  2015   to check for possible malignancy from melanoma, result were scar tissue   melanoma exicison     right leg   HPI:  Pt is a 61 y.o. male who presented to the emergency room for evaluation prolonged course of feeling unwell and specifically with dizziness, imbalance and sustaining multiple falls.  He has been complaining of feeling numb in his toes which is started to creep up more towards his legs.  He has not seen his oncologist over the last 3 years.  He was free of any enhancing brain lesions after his left frontal brain met was treated by stereotactic radiosurgery. MRI brain 01/24/22 showed a left pontine and right thalamic acute/subacute infarct. PMH: metastatic melanoma to lung and brain, diabetes, hypertension, hyperlipidemia.   Assessment / Plan / Recommendation Clinical Impression  Pt's speech, language and cognitive functions are [facility] and supected to  be at baseline. At start of eval, pt reported possible baseline STM deficits, although he was not sure and did not suspect he has had a change post CVA. [facility] Mental Status (SLUMS) examination administered with pt yielding score of 27/30, which is within normal range. All recall tasks were completed with 100% accuracy, though few points deducted for serial number task and clock drawing (no length differentiation of "minute" and "hour" hand). Given results of testing and pt report of baseline function, no further SLP f/u warranted at this time. Will s/o.    SLP Assessment  SLP Recommendation/Assessment: Patient does not need any further Speech [facility] SLP Visit Diagnosis: Cognitive communication deficit (R41.841)    Recommendations for follow up therapy are one component of a multi-disciplinary discharge planning process, led by the attending physician.  Recommendations may be updated based on patient status, additional functional criteria and insurance authorization.    Follow Up Recommendations  No SLP follow up    Assistance Recommended at Discharge  None  Functional Status Assessment Patient has not had a recent decline in their functional status  Frequency and Duration           SLP Evaluation Cognition  Overall Cognitive Status: Within Functional Limits for tasks assessed Arousal/Alertness: Awake/alert Orientation Level: Oriented X4 Year: 2023 Month: February Day of Week: Correct Attention: Sustained Sustained Attention: Appears intact Memory: Appears intact Immediate Memory Recall:  (SLUMS: 5/5 immediate and delayed) Awareness: Appears intact Problem Solving: Appears intact       Comprehension  Auditory Comprehension Overall Auditory Comprehension: Impaired Yes/No Questions: Within Functional Limits Commands: Impaired One Step Basic Commands: 75-100% accurate Multistep Basic Commands: 75-100% accurate Complex Commands: 50-74%  accurate Conversation: Complex Interfering Components: Working Curator: Not tested Reading Comprehension Reading Status: Not tested    Expression Expression Primary Mode of Expression: Verbal Verbal Expression Overall Verbal Expression: Appears within functional limits for tasks assessed Initiation: No impairment Automatic Speech: Name;Social Response Level of Generative/Spontaneous Verbalization: Financial controller Expression Written Expression: Not tested   Oral / Motor  Oral Motor/Sensory Function Overall Oral Motor/Sensory Function: Within functional limits Motor Speech Overall Motor Speech: Appears within functional limits for tasks assessed              [address], [place], [place] Office Number: [phone]  Acie Fredrickson 01/25/2022, 9:39 AM

## 2022-01-25 NOTE — Consult Note (Signed)
Neurology Consultation  Reason for Consult: Dizziness, MRI brain with strokes Referring Physician: Dr. Hal Hope  CC: Dizziness, falls  History is obtained from: Patient, chart  HPI: WLLIAM [name] is a 61 y.o. male past medical history of metastatic melanoma to lung and brain, diabetes, hypertension, hyperlipidemia presented to the emergency room for evaluation prolonged course of feeling unwell and specifically with dizziness, imbalance and sustaining multiple falls.  He has been complaining of feeling numb in his toes which is started to creep up more towards his legs.  He has not seen his oncologist over the last 3 years.  He was free of any enhancing brain lesions after his left frontal brain met was treated by stereotactic radiosurgery. Further evaluation of his dizziness and falls, he was admitted for further medical work-up.  On-call neurologist was called and recommended an MRI of the brain to be done which was completed and showed a left pontine and right thalamic acute/subacute infarct for which neurological consultation was obtained. Patient cannot tell me one single time point where he had sudden onset of focal neurological deficits but has been having trouble with walking and with dizziness over the past few months to years. He reports blurred vision without his glasses and uses glasses for both distance and reading. No headaches.  Reports gloves and stocking type diminished sensation and tingling at times.  No chest pain shortness of breath.  No preceding fevers chills.  In the ER admitted for further medical work-up because of hyponatremia as well as history of alcohol abuse and frequent falls.   LKW: Unclear tpa given?: no, unclear last known well Premorbid modified Rankin scale (mRS): 2  ROS: Full ROS was performed and is negative except as noted in the HPI.   Past Medical History:  Diagnosis Date   Cancer [facility])    metastatic melanoma- spread to lungs, two "spots" on  brain per xray- last chemo tx in 2014, September- "all clear", no tx now   Diabetes mellitus without complication ([place])    Hyperlipidemia    Hypertension    Family History  Problem Relation Age of Onset   Prostate cancer Father    Stomach cancer Paternal Grandmother    Colon cancer Neg Hx    Esophageal cancer Neg Hx    Rectal cancer Neg Hx      Social History:  Denies illicit drug use Denies smoking Drinks 4-5 beers per day  Medications  Current Facility-Administered Medications:     stroke: mapping our early stages of recovery book, , Does not apply, Once, Rise Patience, MD   0.9 %  sodium chloride infusion, , Intravenous, Continuous, Rise Patience, MD, Last Rate: 75 mL/hr at 01/24/22 2201, New Bag at 01/24/22 2201   aspirin suppository 300 mg, 300 mg, Rectal, Daily **OR** aspirin tablet 325 mg, 325 mg, Oral, Daily, Rise Patience, MD   enoxaparin (LOVENOX) injection 40 mg, 40 mg, Subcutaneous, Q24H, Rise Patience, MD, 40 mg at 37/34/28 7681   folic acid (FOLVITE) tablet 1 mg, 1 mg, Oral, Daily, Rise Patience, MD   hydrALAZINE (APRESOLINE) injection 5 mg, 5 mg, Intravenous, Q4H PRN, Rise Patience, MD   insulin aspart (novoLOG) injection 0-9 Units, 0-9 Units, Subcutaneous, TID WC, Rise Patience, MD   LORazepam (ATIVAN) tablet 1-4 mg, 1-4 mg, Oral, Q1H PRN **OR** LORazepam (ATIVAN) injection 1-4 mg, 1-4 mg, Intravenous, Q1H PRN, Rise Patience, MD   LORazepam (ATIVAN) tablet 0-4 mg, 0-4 mg, Oral, Q6H **  FOLLOWED BY** [START ON 01/26/2022] LORazepam (ATIVAN) tablet 0-4 mg, 0-4 mg, Oral, Q12H, Rise Patience, MD   multivitamin with minerals tablet 1 tablet, 1 tablet, Oral, Daily, Rise Patience, MD   thiamine tablet 100 mg, 100 mg, Oral, Daily **OR** thiamine (B-1) injection 100 mg, 100 mg, Intravenous, Daily, Rise Patience, MD  Current Outpatient Medications:    lisinopril-hydrochlorothiazide (ZESTORETIC) 20-12.5  MG tablet, Take 2 tablets by mouth daily., Disp: 90 tablet, Rfl: 1   amLODipine (NORVASC) 10 MG tablet, Take 1 tablet (10 mg total) by mouth daily. (Patient not taking: Reported on 11/11/2021), Disp: 90 tablet, Rfl: 1   atorvastatin (LIPITOR) 20 MG tablet, Take 1 tablet (20 mg total) by mouth daily. (Patient not taking: Reported on 11/11/2021), Disp: 90 tablet, Rfl: 2   glimepiride (AMARYL) 2 MG tablet, TAKE 1/2 TO 1 TABLET BY MOUTH EVERY DAY WITH BREAKFAST (Patient not taking: Reported on 11/11/2021), Disp: 90 tablet, Rfl: 1   metFORMIN (GLUCOPHAGE) 1000 MG tablet, TAKE 1 TABLET BY MOUTH TWICE A DAY WITH A MEAL (Patient not taking: Reported on 11/11/2021), Disp: 180 tablet, Rfl: 1   metoprolol tartrate (LOPRESSOR) 100 MG tablet, Take 1 tablet (100 mg total) by mouth 2 (two) times daily. (Patient not taking: Reported on 11/11/2021), Disp: 180 tablet, Rfl: 3   pioglitazone (ACTOS) 45 MG tablet, Take 1 tablet (45 mg total) by mouth daily. (Patient not taking: Reported on 11/11/2021), Disp: 90 tablet, Rfl: 1   potassium chloride SA (KLOR-CON M) 20 MEQ tablet, Take 2 tablets (40 mEq total) by mouth daily for 3 days. (Patient not taking: Reported on 01/24/2022), Disp: 6 tablet, Rfl: 0   Exam: Current vital signs: BP 107/70    Pulse 77    Temp 98.6 F (37 C) (Oral)    Resp 12    Ht 5' 4"  (1.626 m)    Wt 66 kg    SpO2 100%    BMI 24.98 kg/m  Vital signs in last 24 hours: Temp:  [98.6 F (37 C)] 98.6 F (37 C) (02/21 1628) Pulse Rate:  [77-101] 77 (02/21 2200) Resp:  [12-18] 12 (02/21 2200) BP: (97-118)/(65-75) 107/70 (02/21 2200) SpO2:  [93 %-100 %] 100 % (02/21 2200) Weight:  [66 kg] 66 kg (02/21 1629) General: Awake alert in no distress HEENT: Normocephalic atraumatic Lungs: Clear Cardiovascular: Regular rate rhythm Abdomen nondistended nontender Extremities with discoloration on both calfs Neurological exam Awake alert oriented x3 No dysarthria No aphasia Cranial nerves: Pupils equal round  reactive light, extraocular movements intact, visual fields full, facial sensation intact, face symmetric, tongue and palate midline. Motor examination with coarse tremor but no drift in any of the 4 extremities. Sensory exam: Diminished in glove and stocking type pattern bilaterally. Coordination: Coarse tremor but no gross dysmetria. NIH stroke scale-0  Labs I have reviewed labs in epic and the results pertinent to this consultation are:   CBC    Component Value Date/Time   WBC 7.7 01/24/2022 2200   RBC 4.35 01/24/2022 2200   HGB 13.6 01/24/2022 2200   HCT 37.6 (L) 01/24/2022 2200   PLT 298 01/24/2022 2200   MCV 86.4 01/24/2022 2200   MCH 31.3 01/24/2022 2200   MCHC 36.2 (H) 01/24/2022 2200   RDW 11.6 01/24/2022 2200   LYMPHSABS 0.8 01/24/2022 1636   MONOABS 0.7 01/24/2022 1636   EOSABS 0.0 01/24/2022 1636   BASOSABS 0.0 01/24/2022 1636    CMP     Component Value Date/Time  NA 122 (L) 01/24/2022 2200   K 3.0 (L) 01/24/2022 2200   CL 83 (L) 01/24/2022 2200   CO2 28 01/24/2022 2200   GLUCOSE 173 (H) 01/24/2022 2200   BUN 19 01/24/2022 2200   CREATININE 1.57 (H) 01/24/2022 2200   CREATININE 0.87 11/11/2021 1600   CALCIUM 8.5 (L) 01/24/2022 2200   PROT 6.3 (L) 01/24/2022 1636   ALBUMIN 3.5 01/24/2022 1636   AST 18 01/24/2022 1636   ALT 15 01/24/2022 1636   ALKPHOS 56 01/24/2022 1636   BILITOT 1.2 01/24/2022 1636   GFRNONAA 50 (L) 01/24/2022 2200    Imaging I have reviewed the images obtained:  MRI of the brain with acute infarction in the right thalamus with possible subacute infarct in the left pons along with adjacent more remote lacunar pontine infarction.  Sequela of remote cortical infarctions in the left frontal lobe which I think is residual from his prior tumor resection.  Additional evidence of remote lacunar infarct in the left thalamus and bilateral basal ganglia coronary radiata.  Assessment:  61 year old man past history of metastatic melanoma to the  lung and brain with other medical history of diabetes, hypertension hyperlipidemia presenting for evaluation of nonspecific symptoms of dizziness, gait instability and frequent falls without any clear timeline of focal neurological deficits on MRI noted to have acute right thalamic and subacute left pontine infarction. Also noted to be hyponatremic.  Has a history of alcoholism. On examination, findings are more concerning for polyneuropathy which might be related to his alcohol or prior radiation/chemotherapy. The acute infarct are both in the posterior circulation the right thalamus and left pons which should be investigated with further risk factor work-up including head and neck vessel imaging and any evidence of cardioembolic source for this.  Impression: Acute ischemic stroke in the right thalamus, subacute ischemic stroke in the left pons. Polyneuropathy-likely multifactorial Prior history of brain metastasis from melanoma Melanoma metastatic to the lung Hypertension Diabetes Hyperlipidemia   Recommendations: Admit to hospitalist Frequent neurochecks Consider MRI brain with and without contrast to evaluate for any evidence of metastatic lesions. Aspirin 81+ Plavix 75-now and daily High intensity statin CTA head and neck 2D echo A1c Lipid panel PT OT Speech therapy No need for permissive hypertension-unknown last known well.  Although please avoid hypotension -goal systolic blood pressure should be kept between 120-160. Check B12, TSH, SPEP, UPEP and thiamine levels Start replating high-dose thiamine after drawing the thiamine levels given history of alcoholism. Stroke team will follow Plan relayed to Dr. Hal Hope. -- Amie [place], MD Neurologist [facility] Pager: [phone]

## 2022-01-25 NOTE — Progress Notes (Signed)
PROGRESS NOTE    SELDEN NOTEBOOM  STM:[id number] DOB: [birth year] DOA: 01/24/2022 PCP: Kenneth [name], Rayford Halsted, MD   Brief Narrative:  HPI: Kenneth [name] is a 61 y.o. male with history of hypertension, hyperlipidemia, diabetes mellitus and medically melanoma treated surgically at [facility] who has been not following with his primary care physician almost for 3 years and had last visit in December 2022 when patient was restarted on his antihypertensives at that time patient also indicated pain in his both lower extremities which is burning in sensation and also lost 60 pounds of weight presents to the ER after patient has been having multiple falls with dizziness over the last few days.  Today patient had 2 falls 1 when he was trying to walk another 1 when he was try to get up from the commode.  Did not hit his head or lose consciousness.  Patient's history also provided the history states that there has been no running water in his house for many days now.   ED Course: In the ER patient is mildly tremulous but able to move all extremities 5 x 5.  Has some skin changes in the lower extremities.  No facial admitted tongue is midline CT head is unremarkable.  Neurology is recommending getting MRI brain which showed acute infarct of the right thalamus and left pons.  Patient did pass stroke swallow.  In addition patient labs show acute renal failure with creatinine of 1.8 and sodium of 120 low magnesium and potassium.  Patient also was in the mild hypertensive range for which patient was given fluid bolus.  Patient admitted for acute CVA hyponatremia acute renal failure and poor social condition.  Assessment & Plan:   Principal Problem:   Acute CVA (cerebrovascular accident) ([place]) Active Problems:   Diabetes mellitus without complication ([place])   Essential hypertension   Metastatic melanoma to lung ([place])   Hypokalemia   Dizziness   ARF (acute renal failure) ([facility])   Hyponatremia  Acute CVA  -confirmed on MRI.  CT angiogram of the head and neck negative for LVO.  Completed 2D echo, results pending.  MRI with brain was also negative for any mets.  Patient now on DAPT, aspirin and Plavix, neurology recommends continuing for 3 weeks and then only aspirin.  He is on statin already as well.  PT OT has seen him and they have recommended home health however I was informed by TOC that patient's home does not have electricity or water so he cannot go back home and his family is planning to have him live with one of the sisters but unfortunately that sister is also sick today so they do not want him to go there today but the plan is to have him go to his sister's house tomorrow with arrangements for home health PT OT.  Neurology on board.  Neurology is also looking at ruling out DVT.  If he rules in for DVT, neurology recommends hypercoagulable work-up.  Polyneuropathy for which neurologist has recommended getting SPEP, TSH, B12, folate, thiamine levels.  High-dose thiamine has been ordered.  Alcohol abuse: Last drink on the afternoon of admission which is yesterday.  No signs of withdrawal at this point in time, continue CIWA protocol.   Acute renal failure: likely from poor oral intake and in addition patient also was hypotensive and also was using lisinopril and hydrochlorothiazide.  Presented with creatinine 1.81.  Currently improving and 1.46.  Continue IV fluids.  Avoid nephrotoxic agents.  Continue  to hold lisinopril and hydrochlorothiazide.   Hyponatremia: Could be multifactorial including use of hydrochlorothiazide also beer potomania and hypovolemia.  Sodium further down to 122 but patient is asymptomatic.  Continue normal saline.  Monitor later and tomorrow.  Diabetes mellitus type 2: has not been taking medicines for many months now.  Last hemoglobin A1c just 2 months ago was 6.1.  Hemoglobin A1c pending.  Continue SSI.  History of metastatic melanoma has had surgical treatment previously  including radiation to the brain.  He stopped following up with his oncologist at [facility].  Will need follow-up with oncologist.  60 pound weight loss over the last many months.  Per HPI, dietitian was being consulted but none is ordered.  I will order consult now.  Poor social condition -patient has no water electricity at home.  Social worker on board.  Hypomagnesemia: Resolved.   DVT prophylaxis: enoxaparin (LOVENOX) injection 40 mg Start: 01/24/22 2145   Code Status: Full Code  Family Communication:  None present at bedside.  Plan of care discussed with patient in length and he/she verbalized understanding and agreed with it.  Status is: Observation The patient will require care spanning > 2 midnights and should be moved to inpatient because: Patient cannot be discharged since he does not have a home with electricity and water to go back to.  Estimated body mass index is 24.98 kg/m as calculated from the following:   Height as of this encounter: 5\' 4"  (1.626 m).   Weight as of this encounter: 66 kg.    Nutritional Assessment: Body mass index is 24.98 kg/m.Marland Kitchen Seen by dietician.  I agree with the assessment and plan as outlined below: Nutrition Status:        . Skin Assessment: I have examined the patient's skin and I agree with the wound assessment as performed by the wound care RN as outlined below:    Consultants:  Neurology  Procedures:  None  Antimicrobials:  Anti-infectives (From admission, onward)    None         Subjective: Seen and examined.  He has no complaints.  Objective: Vitals:   01/25/22 0626 01/25/22 0628 01/25/22 0828 01/25/22 1225  BP: (!) 151/127 (!) 155/79 128/63 (!) (P) 197/90  Pulse: (!) 59 65 84 (P) 73  Resp:   20 (P) 20  Temp: 97.6 F (36.4 C)  97.9 F (36.6 C) (P) 97.8 F (36.6 C)  TempSrc: Oral  Oral (P) Oral  SpO2: 97% 98% 100% (P) 100%  Weight:      Height:        Intake/Output Summary (Last 24 hours) at 01/25/2022  1313 Last data filed at 01/25/2022 0900 Gross per 24 hour  Intake 240 ml  Output 250 ml  Net -10 ml   Filed Weights   01/24/22 1629  Weight: 66 kg    Examination:  General exam: Appears calm and comfortable  Respiratory system: Clear to auscultation. Respiratory effort normal. Cardiovascular system: S1 & S2 heard, RRR. No JVD, murmurs, rubs, gallops or clicks. No pedal edema. Gastrointestinal system: Abdomen is nondistended, soft and nontender. No organomegaly or masses felt. Normal bowel sounds heard. Central nervous system: Alert and oriented. No focal neurological deficits. Extremities: Symmetric 5 x 5 power. Skin: No rashes, lesions or ulcers Psychiatry: Judgement and insight appear normal. Mood & affect appropriate.    Data Reviewed: I have personally reviewed following labs and imaging studies  CBC: Recent Labs  Lab 01/24/22 1636 01/24/22 2200 01/25/22 0235  WBC 11.9* 7.7 6.1  NEUTROABS 10.3*  --  4.0  HGB 14.1 13.6 12.5*  HCT 37.7* 37.6* 35.3*  MCV 84.7 86.4 87.4  PLT 370 298 127   Basic Metabolic Panel: Recent Labs  Lab 01/24/22 1636 01/24/22 1747 01/24/22 2200 01/25/22 0235 01/25/22 1053  NA 120*  --  122* 125* 122*  K 3.0*  --  3.0* 3.0* 3.2*  CL 77*  --  83* 87* 83*  CO2 28  --  28 27 30   GLUCOSE 219*  --  173* 122* 186*  BUN 21*  --  19 17 16   CREATININE 1.81*  --  1.57* 1.38* 1.46*  CALCIUM 9.1  --  8.5* 8.0* 8.7*  MG  --  1.5*  --  1.9  --    GFR: Estimated Creatinine Clearance: 45.1 mL/min (A) (by C-G formula based on SCr of 1.46 mg/dL (H)). Liver Function Tests: Recent Labs  Lab 01/24/22 1636  AST 18  ALT 15  ALKPHOS 56  BILITOT 1.2  PROT 6.3*  ALBUMIN 3.5   No results for input(s): LIPASE, AMYLASE in the last 168 hours. No results for input(s): AMMONIA in the last 168 hours. Coagulation Profile: No results for input(s): INR, PROTIME in the last 168 hours. Cardiac Enzymes: No results for input(s): CKTOTAL, CKMB, CKMBINDEX,  TROPONINI in the last 168 hours. BNP (last 3 results) No results for input(s): PROBNP in the last 8760 hours. HbA1C: No results for input(s): HGBA1C in the last 72 hours. CBG: Recent Labs  Lab 01/25/22 0653 01/25/22 1254  GLUCAP 99 180*   Lipid Profile: Recent Labs    01/25/22 0235  CHOL 116  HDL 60  LDLCALC 47  TRIG 46  CHOLHDL 1.9   Thyroid Function Tests: Recent Labs    01/24/22 2200  TSH 1.624   Anemia Panel: Recent Labs    01/24/22 1747  VITAMINB12 231  FOLATE 10.7   Sepsis Labs: No results for input(s): PROCALCITON, LATICACIDVEN in the last 168 hours.  Recent Results (from the past 240 hour(s))  Resp Panel by RT-PCR (Flu A&B, Covid) Nasopharyngeal Swab     Status: None   Collection Time: 01/24/22  4:21 PM   Specimen: Nasopharyngeal Swab; Nasopharyngeal(NP) swabs in vial transport medium  Result Value Ref Range Status   SARS Coronavirus 2 by RT PCR NEGATIVE NEGATIVE Final    Comment: (NOTE) SARS-CoV-2 target nucleic acids are NOT DETECTED.  The SARS-CoV-2 RNA is generally detectable in upper respiratory specimens during the acute phase of infection. The lowest concentration of SARS-CoV-2 viral copies this assay can detect is 138 copies/mL. A negative result does not preclude SARS-Cov-2 infection and should not be used as the sole basis for treatment or other patient management decisions. A negative result may occur with  improper specimen collection/handling, submission of specimen other than nasopharyngeal swab, presence of viral mutation(s) within the areas targeted by this assay, and inadequate number of viral copies(<138 copies/mL). A negative result must be combined with clinical observations, patient history, and epidemiological information. The expected result is Negative.  Fact Sheet for Patients:  EntrepreneurPulse.com.au  Fact Sheet for Healthcare Providers:  IncredibleEmployment.be  This test is no t  yet approved or cleared by the [place] FDA and  has been authorized for detection and/or diagnosis of SARS-CoV-2 by FDA under an Emergency Use Authorization (EUA). This EUA will remain  in effect (meaning this test can be used) for the duration of the COVID-19 declaration under Section 564(b)(1) of the  Act, 21 U.S.C.section 360bbb-3(b)(1), unless the authorization is terminated  or revoked sooner.       Influenza A by PCR NEGATIVE NEGATIVE Final   Influenza B by PCR NEGATIVE NEGATIVE Final    Comment: (NOTE) The Xpert Xpress SARS-CoV-2/FLU/RSV plus assay is intended as an aid in the diagnosis of influenza from Nasopharyngeal swab specimens and should not be used as a sole basis for treatment. Nasal washings and aspirates are unacceptable for Xpert Xpress SARS-CoV-2/FLU/RSV testing.  Fact Sheet for Patients: EntrepreneurPulse.com.au  Fact Sheet for Healthcare Providers: IncredibleEmployment.be  This test is not yet approved or cleared by the [place] FDA and has been authorized for detection and/or diagnosis of SARS-CoV-2 by FDA under an Emergency Use Authorization (EUA). This EUA will remain in effect (meaning this test can be used) for the duration of the COVID-19 declaration under Section 564(b)(1) of the Act, 21 U.S.C. section 360bbb-3(b)(1), unless the authorization is terminated or revoked.  Performed at [facility], [place] [address] [place], [place] [address]      Radiology Studies: CT ANGIO HEAD NECK W WO CM  Result Date: 01/25/2022 CLINICAL DATA:  Stroke/TIA evaluate for embolic source EXAM: CT ANGIOGRAPHY HEAD AND NECK TECHNIQUE: Multidetector CT imaging of the head and neck was performed using the standard protocol during bolus administration of intravenous contrast. Multiplanar CT image reconstructions and MIPs were obtained to evaluate the vascular anatomy. Carotid stenosis measurements (when applicable) are  obtained utilizing NASCET criteria, using the distal internal carotid diameter as the denominator. RADIATION DOSE REDUCTION: This exam was performed according to the departmental dose-optimization program which includes automated exposure control, adjustment of the mA and/or kV according to patient size and/or use of iterative reconstruction technique. CONTRAST:  18mL OMNIPAQUE IOHEXOL 350 MG/ML SOLN COMPARISON:  No prior CTA, correlation is made with CT and MRI head 01/24/2022 FINDINGS: CT HEAD FINDINGS For noncontrast findings, please see 01/24/2022 CT head. CTA NECK FINDINGS Aortic arch: 4 vessel arch with aberrant origin of the right subclavian artery, which passes posterior to the esophagus. Imaged portion shows no evidence of aneurysm or dissection. No significant stenosis of the major arch vessel origins. Aortic atherosclerosis. Right carotid system: No evidence of dissection, stenosis (50% or greater) or occlusion. Left carotid system: No evidence of dissection, stenosis (50% or greater) or occlusion. Vertebral arteries: No evidence of dissection, stenosis (50% or greater) or occlusion. Left dominant system with quite diminutive right vertebral artery for the entirety of its course. Skeleton: No acute osseous abnormality. Straightening and mild reversal of the normal cervical lordosis. Other neck: Negative. Upper chest: Negative. Review of the MIP images confirms the above findings CTA HEAD FINDINGS Anterior circulation: Both internal carotid arteries are patent to the termini, without significant stenosis. A1 segments patent. Normal anterior communicating artery. Anterior cerebral arteries are patent to their distal aspects. No M1 stenosis or occlusion. Normal MCA bifurcations. Distal MCA branches perfused and symmetric. Posterior circulation: Vertebral arteries patent to the vertebrobasilar junction without stenosis. Posterior inferior cerebral arteries patent bilaterally. Basilar patent to its distal  aspect. Superior cerebellar arteries patent bilaterally. Patent P1 segments. PCAs perfused to their distal aspects without stenosis. The bilateral posterior communicating arteries are visualized. Venous sinuses: As permitted by contrast timing, patent. Anatomic variants: None significant Review of the MIP images confirms the above findings IMPRESSION: 1.  No intracranial large vessel occlusion or significant stenosis. 2.  No hemodynamically significant stenosis in the neck. 3. Incidental note is made of an aberrant origin of  the right subclavian artery. Electronically Signed   By: Merilyn Baba M.D.   On: 01/25/2022 02:28   CT Head Wo Contrast  Result Date: 01/24/2022 CLINICAL DATA:  Headache, chronic, new features or increased frequency EXAM: CT HEAD WITHOUT CONTRAST TECHNIQUE: Contiguous axial images were obtained from the base of the skull through the vertex without intravenous contrast. RADIATION DOSE REDUCTION: This exam was performed according to the departmental dose-optimization program which includes automated exposure control, adjustment of the mA and/or kV according to patient size and/or use of iterative reconstruction technique. COMPARISON:  None. BRAIN: BRAIN Cerebral ventricle sizes are concordant with the degree of cerebral volume loss. Patchy and confluent areas of decreased attenuation are noted throughout the deep and periventricular white matter of the cerebral hemispheres bilaterally, compatible with chronic microvascular ischemic disease. Bilateral thalami chronic lacunar infarctions. Chronic left basal ganglia lacunar infarction. No evidence of large-territorial acute infarction. No parenchymal hemorrhage. No mass lesion. No extra-axial collection. No mass effect or midline shift. No hydrocephalus. Basilar cisterns are patent. Vascular: No hyperdense vessel. Skull: No acute fracture or focal lesion. Left temporal craniotomy surgical changes. Sinuses/Orbits: Paranasal sinuses and mastoid  air cells are clear. The orbits are unremarkable. Other: None. IMPRESSION: No acute intracranial abnormality in a patient with chronic microvascular ischemic changes and chronic lacunar infarctions. Electronically Signed   By: Iven Finn M.D.   On: 01/24/2022 18:36   MR BRAIN WO CONTRAST  Result Date: 01/25/2022 CLINICAL DATA:  Dizziness EXAM: MRI HEAD WITHOUT CONTRAST TECHNIQUE: Multiplanar, multiecho pulse sequences of the brain and surrounding structures were obtained without intravenous contrast. COMPARISON:  No prior MRI, correlation is made with CT head 01/24/2022 FINDINGS: Brain: 7 mm area of restricted diffusion with ADC correlate in the right thalamus, consistent with acute infarct. Additional possible area of mildly increased signal on diffusion-weighted imaging with less distinct ADC correlate is seen in the left pons, adjacent to a more remote lacunar infarct (series 2, image 16 and series 250, image 16), possibly a subacute infarct. No acute hemorrhage, mass, mass effect, or midline shift. Additional chronic lacunar infarcts in the left thalamus, left pons, bilateral basal ganglia, and corona radiata, with a remote cortical infarct in the left frontal lobe (series 6, image 19). Confluent T2 hyperintense signal in the periventricular white matter and pons, likely the sequela of moderate to severe chronic small vessel ischemic disease. Vascular: Normal flow voids. Skull and upper cervical spine: Normal marrow signal. Sinuses/Orbits: Negative. Other: None. IMPRESSION: 1. Acute infarct in the right thalamus, with possible subacute infarct in the left pons, adjacent to a more remote lacunar pontine infarct. 2. Sequela of remote cortical infarct in the left frontal lobe and additional remote lacunar infarcts in the left thalamus, bilateral basal ganglia, and corona radiata. Electronically Signed   By: Merilyn Baba M.D.   On: 01/25/2022 00:06   MR BRAIN W CONTRAST  Result Date:  01/25/2022 CLINICAL DATA:  Assess for metastatic disease. History of metastatic melanoma. EXAM: MRI HEAD WITH CONTRAST TECHNIQUE: Multiplanar, multiecho pulse sequences of the brain and surrounding structures were obtained with intravenous contrast. CONTRAST:  58mL GADAVIST GADOBUTROL 1 MMOL/ML IV SOLN COMPARISON:  Noncontrast brain MRI from yesterday FINDINGS: Brain: No enhancement to suggest metastatic disease. No worrisome enhancement along the left craniotomy and frontal encephalomalacia. Ischemic injuries as described on prior brain MRI. Vascular: Unremarkable vascular enhancements Skull and upper cervical spine: Unremarkable left craniotomy. Sinuses/Orbits: Negative Other: Diffuse mild motion artifact with image blurring. IMPRESSION: Negative for metastatic  disease to the brain. Electronically Signed   By: Jorje Guild M.D.   On: 01/25/2022 10:34   DG CHEST PORT 1 VIEW  Result Date: 01/25/2022 CLINICAL DATA:  Stroke EXAM: PORTABLE CHEST 1 VIEW COMPARISON:  Chest CT 11/10/2019 FINDINGS: Normal heart size and mediastinal contours. No acute infiltrate or edema. No effusion or pneumothorax. No acute osseous findings. Healing lateral left seventh and eighth rib fractures with callus. IMPRESSION: 1. No active disease. 2. Healing left seventh and eighth rib fractures. Electronically Signed   By: Jorje Guild M.D.   On: 01/25/2022 06:11    Scheduled Meds:  aspirin EC  81 mg Oral Daily   clopidogrel  75 mg Oral Daily   enoxaparin (LOVENOX) injection  40 mg Subcutaneous E10O   folic acid  1 mg Oral Daily   insulin aspart  0-9 Units Subcutaneous TID WC   LORazepam  0-4 mg Oral Q6H   Followed by   Derrill Memo ON 01/26/2022] LORazepam  0-4 mg Oral Q12H   multivitamin with minerals  1 tablet Oral Daily   Continuous Infusions:  sodium chloride 75 mL/hr at 01/25/22 0706   thiamine injection 500 mg (01/25/22 1249)     LOS: 0 days   Time spent: 35 minutes  Darliss Cheney, MD [facility]  01/25/2022, 1:13 PM  Please page via [facility] and do not message via secure chat for urgent patient care matters. Secure chat can be used for non urgent patient care matters.  How to contact the [facility] Attending or Consulting provider [place] or covering provider during after hours [place], for this patient?  Check the care team in [facility] and look for a) attending/consulting [facility] provider listed and b) the [facility] team listed. Page or secure chat [phone]. Log into [email or web address] and use [place]'s universal password to access. If you do not have the password, please contact the hospital operator. Locate the [facility] provider you are looking for under [facility] and page to a number that you can be directly reached. If you still have difficulty reaching the provider, please page the [facility] (Director on Call) for the Hospitalists listed on amion for assistance.

## 2022-01-25 NOTE — ED Notes (Signed)
Patient transported to CT 

## 2022-01-25 NOTE — Evaluation (Signed)
Physical Therapy Evaluation Patient Details Name: Kenneth [name] MRN: [id number] DOB: January 13, 1961 Today's Date: 01/25/2022  History of Present Illness  Kenneth [name] is a 61 y.o. male who presented to the ED for evaluation prolonged course of feeling unwell and specifically with dizziness, imbalance and sustaining multiple falls. 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 Past medical history of metastatic melanoma to lung and brain, diabetes, hypertension, hyperlipidemia, alcohol abuse   Clinical Impression  Pt admitted with above. Pt presenting with bilat LE neuropathy, ataxic gait, freq falls, noted fine motor impairment in hands, suspect vision deficits as pt closing L eye when trying to read, although pt denies having vision problems, in addition to impaired balance with high risk of falling. Pt reports "I feel the exact same as always" when asked how he feels compared to a week ago. Pt unsafe to return home alone as pt falling multiple times a week and pt unable to maintain self hygiene as pt's house without water. Pt reports "My sisters are trying to figure out where I'm going after here." Recommend HHPT if patient has 24/7 assist.       Recommendations for follow up therapy are one component of a multi-disciplinary discharge planning process, led by the attending physician.  Recommendations may be updated based on patient status, additional functional criteria and insurance authorization.  Follow Up Recommendations Home health PT    Assistance Recommended at Discharge Frequent or constant Supervision/Assistance  Patient can return home with the following  A little help with walking and/or transfers;A little help with bathing/dressing/bathroom;Assist for transportation;Help with stairs or ramp for entrance    Equipment Recommendations None recommended by PT (pt reports having RW)  Recommendations for Other Services       Functional Status Assessment  Patient has had a recent decline in their functional status and demonstrates the ability to make significant improvements in function in a reasonable and predictable amount of time.     Precautions / Restrictions Precautions Precautions: Fall Precaution Comments: pt reports 5 falls in the last month and bilat LE neuropathy Restrictions Weight Bearing Restrictions: No      Mobility  Bed Mobility Overal bed mobility: Modified Independent             General bed mobility comments: no physical assist, HOB slightly elevated, increased time    Transfers Overall transfer level: Needs assistance Equipment used: Rolling walker (2 wheels) Transfers: Sit to/from Stand Sit to Stand: Min assist           General transfer comment: verbal cues for safe hand placement, minA to steady during transition of hands from bed to walker.    Ambulation/Gait Ambulation/Gait assistance: Min assist Gait Distance (Feet): 100 Feet Assistive device: Rolling walker (2 wheels) Gait Pattern/deviations: Step-through pattern, Decreased stride length, Ataxic, Wide base of support Gait velocity: dec Gait velocity interpretation: <1.31 ft/sec, indicative of household ambulator   General Gait Details: pt with wide base of support with ataxic like pattern as he reports "I can't really feel my feet." minA for walker management into the bathroom and to assist during turns as he reports dizziness with turning. Pt with mild tremors as well  Stairs            Wheelchair Mobility    Modified Rankin (Stroke Patients Only) Modified Rankin (Stroke Patients Only) Pre-Morbid Rankin Score: Moderate disability Modified Rankin: Moderately severe disability     Balance Overall balance assessment: Needs assistance, History of Falls Sitting-balance  support: Feet supported Sitting balance-Leahy Scale: Good     Standing balance support: Single extremity supported, During functional activity Standing  balance-Leahy Scale: Fair Standing balance comment: incr stability wtih BUE supported                             Pertinent Vitals/Pain Pain Assessment Pain Assessment: No/denies pain    Home Living Family/patient expects to be discharged to:: Private residence Living Arrangements: Alone Available Help at Discharge: Family;Available PRN/intermittently Type of Home: House Home Access: Stairs to enter Entrance Stairs-Rails: None Entrance Stairs-Number of Steps: 1 Alternate Level Stairs-Number of Steps: 5 Home Layout: One level;Two level Home Equipment: [place] single Barista (2 wheels) Additional Comments: works as a Research scientist (physical sciences). Home does not currently have working plumbing.    Prior Function Prior Level of Function : Needs assist;Working/employed;Driving;History of Falls (last six months)       Physical Assist : Mobility (physical);ADLs (physical) Mobility (physical): Gait ADLs (physical): Bathing;Dressing Mobility Comments: pt reports using SPC in home and RW at work, reports having frequent falls "I get twisted up and fall straight on my butt" ADLs Comments: generally indep, drives & works. uses bucket of water from bathtub to toilet & sponge bathe (no plumbing)     Hand Dominance   Dominant Hand: Right    Extremity/Trunk Assessment   Upper Extremity Assessment Upper Extremity Assessment:  (noted fine motor deficits when operating phone and mild tremor)    Lower Extremity Assessment Lower Extremity Assessment: Generalized weakness (pt with noted ataxia when walking, reports "i can't feel my feet")    Cervical / Trunk Assessment Cervical / Trunk Assessment: Normal  Communication   Communication: No difficulties  Cognition Arousal/Alertness: Awake/alert Behavior During Therapy: WFL for tasks assessed/performed Overall Cognitive Status: No family/caregiver present to determine baseline cognitive functioning                                  General Comments: overall WFL for functinoal tasks assesed. Limited insight to safety & deficits as he feels it is fine that he is falling alot, that he can drive despite not feeling his feet, and it's okay to live in a house without water. Also giving conflicting report of signs and symptoms throughout.        General Comments General comments (skin integrity, edema, etc.): VSS on RA. Pt has glasses but removed them when looking at phone and brought phone up close to face and closed L eye. When asked if pt had impaired vision/double vision pt stated "no, I always do that when I look at my phone." in reference to closing is L eye, pt noted fine dexterity deficits as pt trying to use phone    Exercises     Assessment/Plan    PT Assessment Patient needs continued PT services  PT Problem List Decreased strength;Decreased activity tolerance;Decreased balance;Decreased mobility;Decreased coordination;Decreased cognition;Decreased knowledge of use of DME;Decreased safety awareness       PT Treatment Interventions DME instruction;Gait training;Stair training;Functional mobility training;Therapeutic activities;Therapeutic exercise;Balance training;Neuromuscular re-education    PT Goals (Current goals can be found in the Care Plan section)  Acute Rehab PT Goals Patient Stated Goal: didn't say PT Goal Formulation: With patient Time For Goal Achievement: 02/01/22 Potential to Achieve Goals: Fair Additional Goals Additional Goal #1: Pt to score >19 on DGI to indicate minimal falls risk.  Frequency Min 4X/week     Co-evaluation PT/OT/SLP Co-Evaluation/Treatment: Yes Reason for Co-Treatment: To address functional/ADL transfers PT goals addressed during session: Mobility/safety with mobility OT goals addressed during session: ADL's and self-care       AM-PAC PT "6 Clicks" Mobility  Outcome Measure Help needed turning from your back to your side while in a flat  bed without using bedrails?: None Help needed moving from lying on your back to sitting on the side of a flat bed without using bedrails?: None Help needed moving to and from a bed to a chair (including a wheelchair)?: A Little Help needed standing up from a chair using your arms (e.g., wheelchair or bedside chair)?: A Little Help needed to walk in hospital room?: A Little Help needed climbing 3-5 steps with a railing? : A Lot 6 Click Score: 19    End of Session Equipment Utilized During Treatment: Gait belt Activity Tolerance: Patient tolerated treatment well Patient left: in chair;with call bell/phone within reach;with chair alarm set Nurse Communication: Mobility status PT Visit Diagnosis: Unsteadiness on feet (R26.81);Muscle weakness (generalized) (M62.81);History of falling (Z91.81)    Time: 1660-6301 PT Time Calculation (min) (ACUTE ONLY): 33 min   Charges:   PT Evaluation $PT Eval Moderate Complexity: 1 Mod          Kittie Plater, PT, DPT Acute Rehabilitation Services Pager #: [phone] Office #: [phone]   Berline Lopes 01/25/2022, 1:45 PM

## 2022-01-25 NOTE — TOC CAGE-AID Note (Signed)
Transition of Care [facility]) - CAGE-AID Screening   Patient Details  Name: Kenneth [name] MRN: [id number] Date of Birth: 1960-12-05  Transition of [facility]) CM/SW Contact:    Pollie Friar, RN Phone Number: 01/25/2022, 2:32 PM   Clinical Narrative: Patient offered inpatient and outpatient alcohol counseling. 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 He states he can decrease the alcohol use on his own.    CAGE-AID Screening:    Have You Ever Felt You Ought to Cut Down on Your Drinking or Drug Use?: Yes Have People Annoyed You By Critizing Your Drinking Or Drug Use?: Yes Have You Felt Bad Or Guilty About Your Drinking Or Drug Use?: Yes Have You Ever Had a Drink or Used Drugs First Thing In The Morning to Steady Your Nerves or to Get Rid of a Hangover?: Yes CAGE-AID Score: 4  Substance Abuse Education Offered: Yes (patient refused)

## 2022-01-25 NOTE — Progress Notes (Incomplete)
STROKE TEAM PROGRESS NOTE   SUBJECTIVE (INTERVAL HISTORY) His *** is at the bedside.  Overall his condition is {course:17::"unchanged"}. ***   OBJECTIVE CBC:  Recent Labs  Lab 01/24/22 1636 01/24/22 2200 01/25/22 0235  WBC 11.9* 7.7 6.1  NEUTROABS 10.3*  --  4.0  HGB 14.1 13.6 12.5*  HCT 37.7* 37.6* 35.3*  MCV 84.7 86.4 87.4  PLT 370 298 229   Basic Metabolic Panel:  Recent Labs  Lab 01/24/22 1747 01/24/22 2200 01/25/22 0235  NA  --  122* 125*  K  --  3.0* 3.0*  CL  --  83* 87*  CO2  --  28 27  GLUCOSE  --  173* 122*  BUN  --  19 17  CREATININE  --  1.57* 1.38*  CALCIUM  --  8.5* 8.0*  MG 1.5*  --  1.9   Lipid Panel:  Recent Labs  Lab 01/25/22 0235  CHOL 116  TRIG 46  HDL 60  CHOLHDL 1.9  VLDL 9  LDLCALC 47   HgbA1c: No results for input(s): HGBA1C in the last 168 hours. Urine Drug Screen: No results for input(s): LABOPIA, COCAINSCRNUR, LABBENZ, AMPHETMU, THCU, LABBARB in the last 168 hours.  Alcohol Level  Recent Labs  Lab 01/24/22 1755  ETH <10    IMAGING past 24 hours CT ANGIO HEAD NECK W WO CM  Result Date: 01/25/2022 CLINICAL DATA:  Stroke/TIA evaluate for embolic source EXAM: CT ANGIOGRAPHY HEAD AND NECK TECHNIQUE: Multidetector CT imaging of the head and neck was performed using the standard protocol during bolus administration of intravenous contrast. Multiplanar CT image reconstructions and MIPs were obtained to evaluate the vascular anatomy. Carotid stenosis measurements (when applicable) are obtained utilizing NASCET criteria, using the distal internal carotid diameter as the denominator. RADIATION DOSE REDUCTION: This exam was performed according to the departmental dose-optimization program which includes automated exposure control, adjustment of the mA and/or kV according to patient size and/or use of iterative reconstruction technique. CONTRAST:  46mL OMNIPAQUE IOHEXOL 350 MG/ML SOLN COMPARISON:  No prior CTA, correlation is made with CT and  MRI head 01/24/2022 FINDINGS: CT HEAD FINDINGS For noncontrast findings, please see 01/24/2022 CT head. CTA NECK FINDINGS Aortic arch: 4 vessel arch with aberrant origin of the right subclavian artery, which passes posterior to the esophagus. Imaged portion shows no evidence of aneurysm or dissection. No significant stenosis of the major arch vessel origins. Aortic atherosclerosis. Right carotid system: No evidence of dissection, stenosis (50% or greater) or occlusion. Left carotid system: No evidence of dissection, stenosis (50% or greater) or occlusion. Vertebral arteries: No evidence of dissection, stenosis (50% or greater) or occlusion. Left dominant system with quite diminutive right vertebral artery for the entirety of its course. Skeleton: No acute osseous abnormality. Straightening and mild reversal of the normal cervical lordosis. Other neck: Negative. Upper chest: Negative. Review of the MIP images confirms the above findings CTA HEAD FINDINGS Anterior circulation: Both internal carotid arteries are patent to the termini, without significant stenosis. A1 segments patent. Normal anterior communicating artery. Anterior cerebral arteries are patent to their distal aspects. No M1 stenosis or occlusion. Normal MCA bifurcations. Distal MCA branches perfused and symmetric. Posterior circulation: Vertebral arteries patent to the vertebrobasilar junction without stenosis. Posterior inferior cerebral arteries patent bilaterally. Basilar patent to its distal aspect. Superior cerebellar arteries patent bilaterally. Patent P1 segments. PCAs perfused to their distal aspects without stenosis. The bilateral posterior communicating arteries are visualized. Venous sinuses: As permitted by contrast timing, patent.  Anatomic variants: None significant Review of the MIP images confirms the above findings IMPRESSION: 1.  No intracranial large vessel occlusion or significant stenosis. 2.  No hemodynamically significant stenosis  in the neck. 3. Incidental note is made of an aberrant origin of the right subclavian artery. Electronically Signed   By: Merilyn Baba M.D.   On: 01/25/2022 02:28   CT Head Wo Contrast  Result Date: 01/24/2022 CLINICAL DATA:  Headache, chronic, new features or increased frequency EXAM: CT HEAD WITHOUT CONTRAST TECHNIQUE: Contiguous axial images were obtained from the base of the skull through the vertex without intravenous contrast. RADIATION DOSE REDUCTION: This exam was performed according to the departmental dose-optimization program which includes automated exposure control, adjustment of the mA and/or kV according to patient size and/or use of iterative reconstruction technique. COMPARISON:  None. BRAIN: BRAIN Cerebral ventricle sizes are concordant with the degree of cerebral volume loss. Patchy and confluent areas of decreased attenuation are noted throughout the deep and periventricular white matter of the cerebral hemispheres bilaterally, compatible with chronic microvascular ischemic disease. Bilateral thalami chronic lacunar infarctions. Chronic left basal ganglia lacunar infarction. No evidence of large-territorial acute infarction. No parenchymal hemorrhage. No mass lesion. No extra-axial collection. No mass effect or midline shift. No hydrocephalus. Basilar cisterns are patent. Vascular: No hyperdense vessel. Skull: No acute fracture or focal lesion. Left temporal craniotomy surgical changes. Sinuses/Orbits: Paranasal sinuses and mastoid air cells are clear. The orbits are unremarkable. Other: None. IMPRESSION: No acute intracranial abnormality in a patient with chronic microvascular ischemic changes and chronic lacunar infarctions. Electronically Signed   By: Iven Finn M.D.   On: 01/24/2022 18:36   MR BRAIN WO CONTRAST  Result Date: 01/25/2022 CLINICAL DATA:  Dizziness EXAM: MRI HEAD WITHOUT CONTRAST TECHNIQUE: Multiplanar, multiecho pulse sequences of the brain and surrounding  structures were obtained without intravenous contrast. COMPARISON:  No prior MRI, correlation is made with CT head 01/24/2022 FINDINGS: Brain: 7 mm area of restricted diffusion with ADC correlate in the right thalamus, consistent with acute infarct. Additional possible area of mildly increased signal on diffusion-weighted imaging with less distinct ADC correlate is seen in the left pons, adjacent to a more remote lacunar infarct (series 2, image 16 and series 250, image 16), possibly a subacute infarct. No acute hemorrhage, mass, mass effect, or midline shift. Additional chronic lacunar infarcts in the left thalamus, left pons, bilateral basal ganglia, and corona radiata, with a remote cortical infarct in the left frontal lobe (series 6, image 19). Confluent T2 hyperintense signal in the periventricular white matter and pons, likely the sequela of moderate to severe chronic small vessel ischemic disease. Vascular: Normal flow voids. Skull and upper cervical spine: Normal marrow signal. Sinuses/Orbits: Negative. Other: None. IMPRESSION: 1. Acute infarct in the right thalamus, with possible subacute infarct in the left pons, adjacent to a more remote lacunar pontine infarct. 2. Sequela of remote cortical infarct in the left frontal lobe and additional remote lacunar infarcts in the left thalamus, bilateral basal ganglia, and corona radiata. Electronically Signed   By: Merilyn Baba M.D.   On: 01/25/2022 00:06   MR BRAIN W CONTRAST  Result Date: 01/25/2022 CLINICAL DATA:  Assess for metastatic disease. History of metastatic melanoma. EXAM: MRI HEAD WITH CONTRAST TECHNIQUE: Multiplanar, multiecho pulse sequences of the brain and surrounding structures were obtained with intravenous contrast. CONTRAST:  43mL GADAVIST GADOBUTROL 1 MMOL/ML IV SOLN COMPARISON:  Noncontrast brain MRI from yesterday FINDINGS: Brain: No enhancement to suggest metastatic disease. No  worrisome enhancement along the left craniotomy and frontal  encephalomalacia. Ischemic injuries as described on prior brain MRI. Vascular: Unremarkable vascular enhancements Skull and upper cervical spine: Unremarkable left craniotomy. Sinuses/Orbits: Negative Other: Diffuse mild motion artifact with image blurring. IMPRESSION: Negative for metastatic disease to the brain. Electronically Signed   By: Jorje Guild M.D.   On: 01/25/2022 10:34   DG CHEST PORT 1 VIEW  Result Date: 01/25/2022 CLINICAL DATA:  Stroke EXAM: PORTABLE CHEST 1 VIEW COMPARISON:  Chest CT 11/10/2019 FINDINGS: Normal heart size and mediastinal contours. No acute infiltrate or edema. No effusion or pneumothorax. No acute osseous findings. Healing lateral left seventh and eighth rib fractures with callus. IMPRESSION: 1. No active disease. 2. Healing left seventh and eighth rib fractures. Electronically Signed   By: Jorje Guild M.D.   On: 01/25/2022 06:11     PHYSICAL EXAM Temp:  [97.6 F (36.4 C)-98.6 F (37 C)] 97.9 F (36.6 C) (02/22 0828) Pulse Rate:  [59-101] 84 (02/22 0828) Resp:  [10-20] 20 (02/22 0828) BP: (97-159)/(63-127) 128/63 (02/22 0828) SpO2:  [93 %-100 %] 100 % (02/22 0828) Weight:  [66 kg] 66 kg (02/21 1629)  General - Well nourished, well developed, in no apparent distress***.  Cardiovascular - Regular rhythm and rate***.  Mental Status -  Level of arousal and orientation to time, place, and person were intact***. Language including expression, naming, repetition, comprehension was assessed and found intact***. Attention span and concentration were normal***. Recent and remote memory were intact***. Fund of Knowledge was assessed and was intact***.  Cranial Nerves II - XII - II - Visual field intact OU***. III, IV, VI - Extraocular movements intact***. V - Facial sensation intact bilaterally***. VII - Facial movement intact bilaterally***. VIII - Hearing & vestibular intact bilaterally***. X - Palate elevates symmetrically***. XI - Chin turning &  shoulder shrug intact bilaterally***. XII - Tongue protrusion intact***.  Motor Strength - The patients strength was normal in all extremities and pronator drift was absent.  Bulk was normal and fasciculations were absent***.   Motor Tone - Muscle tone was assessed at the neck and appendages and was normal***.  Sensory - Light touch, temperature/pinprick were assessed and were symmetrical***.    Coordination - The patient had normal movements in the hands and feet with no ataxia or dysmetria.  Tremor was absent***.  Gait and Station - deferred.    ASSESSMENT/PLAN Mr. PARRY PO is a 61 y.o. male with history of HTN, HLD, T2DM, and metastatic melanoma with brain and lung mets s/p chemotherapy/radiation presenting with prolonged dizziness, imbalance, and numbness in toes ascending to legs, as well as multiple falls.   Stroke: Acute right thalamic infarct  with possible subacute infarct in L pons Code Stroke CT head No acute abnormality. Chronic microvascular ischemic changes CTA head & neck No large vessel occlusion or stenosis. MRI  Acute right thalamic infarct  with possible subacute infarct in L pons, remote cortical infarct in L frontal lobe and lacunar infarcts in L thalamus, bilateral basal ganglia, and corona radiata. 2D Echo pending LE Doppler pending LDL 47 HgbA1c 6.1 VTE prophylaxis - SQ Lovenox    Diet   Diet heart healthy/carb modified Room service appropriate? Yes; Fluid consistency: Thin   No antithrombotic prior to admission, now on aspirin 81 mg daily and clopidogrel 75 mg daily x 3 weeks then ASA monotherapy Therapy recommendations:  Pending PT/OT Disposition:  Pending  Hypertension Home meds:  Norvasc 10 mg, Lisinopril-HCTZ 20-12.5 mg 2 tablets  daily, Metoprolol 100 mg BID Stable SBP goal < 160 but gradually normalize in 5-7 days Long-term BP goal normotensive  Hyperlipidemia Home meds:  Atorvastatin 20 mg LDL 47, goal < 70 High intensity statin not  indicated  Continue statin at discharge  Diabetes type II Controlled Home meds:  Glimeperide 2 mg 0.5-1 Tablet qAM, Metformin 1000 mg BID, Pioglitazone 45 mg HgbA1c 6.1, goal < 7.0 CBGs Recent Labs    01/25/22 0653  GLUCAP 99    History of metastatic melanoma with lung and brain mets  Ordered MRI w/ contrast for surveillance: Negative metastatic disease  Other Stroke Risk Factors ETOH use (4-5 beers per day), alcohol level <10, advised to drink no more than 1-2 drink(s) a day Substance abuse - UDS:  THC No results found for requested labs within last 26280 hours., Cocaine No results found for requested labs within last 26280 hours.. Patient advised to stop using due to stroke risk.  Other Active Problems N/A  Hospital day # 0    Rosezetta Schlatter, MD Stroke Neurology- Neuro Psych Resident 01/25/2022 11:25 AM    To contact Stroke Continuity provider, please refer to [email or web address] After hours, contact General Neurology

## 2022-01-25 NOTE — Progress Notes (Signed)
STROKE TEAM PROGRESS NOTE   SUBJECTIVE (INTERVAL HISTORY) TTE tech is at the bedside.  Overall his condition is gradually improving. Pt stated that he has dizziness all the time when he turns around, constant BLE numbness, more at toes. Also whole body weakness, more so on the BLEs. He fell twice yesterday, one forward and one backward. His sister sent him to ED. He still drinking alcohol 3X 12 Oz daily, much better than before.    OBJECTIVE Temp:  [97.6 F (36.4 C)-98.6 F (37 C)] 97.8 F (36.6 C) (02/22 1225) Pulse Rate:  [59-101] 73 (02/22 1225) Cardiac Rhythm: Normal sinus rhythm (02/22 0700) Resp:  [10-20] 20 (02/22 1225) BP: (97-197)/(63-127) 197/90 (02/22 1225) SpO2:  [93 %-100 %] 100 % (02/22 1225) Weight:  [66 kg] 66 kg (02/21 1629)  Recent Labs  Lab 01/25/22 0653 01/25/22 1254  GLUCAP 99 180*   Recent Labs  Lab 01/24/22 1636 01/24/22 1747 01/24/22 2200 01/25/22 0235 01/25/22 1053  NA 120*  --  122* 125* 122*  K 3.0*  --  3.0* 3.0* 3.2*  CL 77*  --  83* 87* 83*  CO2 28  --  28 27 30   GLUCOSE 219*  --  173* 122* 186*  BUN 21*  --  19 17 16   CREATININE 1.81*  --  1.57* 1.38* 1.46*  CALCIUM 9.1  --  8.5* 8.0* 8.7*  MG  --  1.5*  --  1.9  --    Recent Labs  Lab 01/24/22 1636  AST 18  ALT 15  ALKPHOS 56  BILITOT 1.2  PROT 6.3*  ALBUMIN 3.5   Recent Labs  Lab 01/24/22 1636 01/24/22 2200 01/25/22 0235  WBC 11.9* 7.7 6.1  NEUTROABS 10.3*  --  4.0  HGB 14.1 13.6 12.5*  HCT 37.7* 37.6* 35.3*  MCV 84.7 86.4 87.4  PLT 370 298 282   No results for input(s): CKTOTAL, CKMB, CKMBINDEX, TROPONINI in the last 168 hours. No results for input(s): LABPROT, INR in the last 72 hours. No results for input(s): COLORURINE, LABSPEC, [place], GLUCOSEU, HGBUR, BILIRUBINUR, KETONESUR, PROTEINUR, UROBILINOGEN, NITRITE, LEUKOCYTESUR in the last 72 hours.  Invalid input(s): APPERANCEUR     Component Value Date/Time   CHOL 116 01/25/2022 0235   TRIG 46 01/25/2022 0235    HDL 60 01/25/2022 0235   CHOLHDL 1.9 01/25/2022 0235   VLDL 9 01/25/2022 0235   LDLCALC 47 01/25/2022 0235   LDLCALC 44 11/11/2021 1600   Lab Results  Component Value Date   HGBA1C 6.1 (H) 11/11/2021   No results found for: LABOPIA, [place], [place], [place], [place], [place]  Recent Labs  Lab 01/24/22 [place] <10    I have personally reviewed the radiological images below and agree with the radiology interpretations.  CT ANGIO HEAD NECK W WO CM  Result Date: 01/25/2022 CLINICAL DATA:  Stroke/TIA evaluate for embolic source EXAM: CT ANGIOGRAPHY HEAD AND NECK TECHNIQUE: Multidetector CT imaging of the head and neck was performed using the standard protocol during bolus administration of intravenous contrast. Multiplanar CT image reconstructions and MIPs were obtained to evaluate the vascular anatomy. Carotid stenosis measurements (when applicable) are obtained utilizing NASCET criteria, using the distal internal carotid diameter as the denominator. RADIATION DOSE REDUCTION: This exam was performed according to the departmental dose-optimization program which includes automated exposure control, adjustment of the mA and/or kV according to patient size and/or use of iterative reconstruction technique. CONTRAST:  103mL OMNIPAQUE IOHEXOL 350 MG/ML SOLN COMPARISON:  No prior  CTA, correlation is made with CT and MRI head 01/24/2022 FINDINGS: CT HEAD FINDINGS For noncontrast findings, please see 01/24/2022 CT head. CTA NECK FINDINGS Aortic arch: 4 vessel arch with aberrant origin of the right subclavian artery, which passes posterior to the esophagus. Imaged portion shows no evidence of aneurysm or dissection. No significant stenosis of the major arch vessel origins. Aortic atherosclerosis. Right carotid system: No evidence of dissection, stenosis (50% or greater) or occlusion. Left carotid system: No evidence of dissection, stenosis (50% or greater) or occlusion. Vertebral arteries: No evidence of  dissection, stenosis (50% or greater) or occlusion. Left dominant system with quite diminutive right vertebral artery for the entirety of its course. Skeleton: No acute osseous abnormality. Straightening and mild reversal of the normal cervical lordosis. Other neck: Negative. Upper chest: Negative. Review of the MIP images confirms the above findings CTA HEAD FINDINGS Anterior circulation: Both internal carotid arteries are patent to the termini, without significant stenosis. A1 segments patent. Normal anterior communicating artery. Anterior cerebral arteries are patent to their distal aspects. No M1 stenosis or occlusion. Normal MCA bifurcations. Distal MCA branches perfused and symmetric. Posterior circulation: Vertebral arteries patent to the vertebrobasilar junction without stenosis. Posterior inferior cerebral arteries patent bilaterally. Basilar patent to its distal aspect. Superior cerebellar arteries patent bilaterally. Patent P1 segments. PCAs perfused to their distal aspects without stenosis. The bilateral posterior communicating arteries are visualized. Venous sinuses: As permitted by contrast timing, patent. Anatomic variants: None significant Review of the MIP images confirms the above findings IMPRESSION: 1.  No intracranial large vessel occlusion or significant stenosis. 2.  No hemodynamically significant stenosis in the neck. 3. Incidental note is made of an aberrant origin of the right subclavian artery. Electronically Signed   By: Merilyn Baba M.D.   On: 01/25/2022 02:28   CT Head Wo Contrast  Result Date: 01/24/2022 CLINICAL DATA:  Headache, chronic, new features or increased frequency EXAM: CT HEAD WITHOUT CONTRAST TECHNIQUE: Contiguous axial images were obtained from the base of the skull through the vertex without intravenous contrast. RADIATION DOSE REDUCTION: This exam was performed according to the departmental dose-optimization program which includes automated exposure control,  adjustment of the mA and/or kV according to patient size and/or use of iterative reconstruction technique. COMPARISON:  None. BRAIN: BRAIN Cerebral ventricle sizes are concordant with the degree of cerebral volume loss. Patchy and confluent areas of decreased attenuation are noted throughout the deep and periventricular white matter of the cerebral hemispheres bilaterally, compatible with chronic microvascular ischemic disease. Bilateral thalami chronic lacunar infarctions. Chronic left basal ganglia lacunar infarction. No evidence of large-territorial acute infarction. No parenchymal hemorrhage. No mass lesion. No extra-axial collection. No mass effect or midline shift. No hydrocephalus. Basilar cisterns are patent. Vascular: No hyperdense vessel. Skull: No acute fracture or focal lesion. Left temporal craniotomy surgical changes. Sinuses/Orbits: Paranasal sinuses and mastoid air cells are clear. The orbits are unremarkable. Other: None. IMPRESSION: No acute intracranial abnormality in a patient with chronic microvascular ischemic changes and chronic lacunar infarctions. Electronically Signed   By: Iven Finn M.D.   On: 01/24/2022 18:36   MR BRAIN WO CONTRAST  Result Date: 01/25/2022 CLINICAL DATA:  Dizziness EXAM: MRI HEAD WITHOUT CONTRAST TECHNIQUE: Multiplanar, multiecho pulse sequences of the brain and surrounding structures were obtained without intravenous contrast. COMPARISON:  No prior MRI, correlation is made with CT head 01/24/2022 FINDINGS: Brain: 7 mm area of restricted diffusion with ADC correlate in the right thalamus, consistent with acute infarct. Additional possible area  of mildly increased signal on diffusion-weighted imaging with less distinct ADC correlate is seen in the left pons, adjacent to a more remote lacunar infarct (series 2, image 16 and series 250, image 16), possibly a subacute infarct. No acute hemorrhage, mass, mass effect, or midline shift. Additional chronic lacunar  infarcts in the left thalamus, left pons, bilateral basal ganglia, and corona radiata, with a remote cortical infarct in the left frontal lobe (series 6, image 19). Confluent T2 hyperintense signal in the periventricular white matter and pons, likely the sequela of moderate to severe chronic small vessel ischemic disease. Vascular: Normal flow voids. Skull and upper cervical spine: Normal marrow signal. Sinuses/Orbits: Negative. Other: None. IMPRESSION: 1. Acute infarct in the right thalamus, with possible subacute infarct in the left pons, adjacent to a more remote lacunar pontine infarct. 2. Sequela of remote cortical infarct in the left frontal lobe and additional remote lacunar infarcts in the left thalamus, bilateral basal ganglia, and corona radiata. Electronically Signed   By: Merilyn Baba M.D.   On: 01/25/2022 00:06   MR BRAIN W CONTRAST  Result Date: 01/25/2022 CLINICAL DATA:  Assess for metastatic disease. History of metastatic melanoma. EXAM: MRI HEAD WITH CONTRAST TECHNIQUE: Multiplanar, multiecho pulse sequences of the brain and surrounding structures were obtained with intravenous contrast. CONTRAST:  51mL GADAVIST GADOBUTROL 1 MMOL/ML IV SOLN COMPARISON:  Noncontrast brain MRI from yesterday FINDINGS: Brain: No enhancement to suggest metastatic disease. No worrisome enhancement along the left craniotomy and frontal encephalomalacia. Ischemic injuries as described on prior brain MRI. Vascular: Unremarkable vascular enhancements Skull and upper cervical spine: Unremarkable left craniotomy. Sinuses/Orbits: Negative Other: Diffuse mild motion artifact with image blurring. IMPRESSION: Negative for metastatic disease to the brain. Electronically Signed   By: Jorje Guild M.D.   On: 01/25/2022 10:34   DG CHEST PORT 1 VIEW  Result Date: 01/25/2022 CLINICAL DATA:  Stroke EXAM: PORTABLE CHEST 1 VIEW COMPARISON:  Chest CT 11/10/2019 FINDINGS: Normal heart size and mediastinal contours. No acute  infiltrate or edema. No effusion or pneumothorax. No acute osseous findings. Healing lateral left seventh and eighth rib fractures with callus. IMPRESSION: 1. No active disease. 2. Healing left seventh and eighth rib fractures. Electronically Signed   By: Jorje Guild M.D.   On: 01/25/2022 06:11   ECHOCARDIOGRAM COMPLETE  Result Date: 01/25/2022    ECHOCARDIOGRAM REPORT   Patient Name:   Kenneth [name] Date of Exam: 01/25/2022 Medical Rec #:  [id number]        Height:       64.0 in Accession #:    [id number]       Weight:       145.5 lb Date of Birth:  16-Oct-1961         BSA:          1.709 m Patient Age:    30 years         BP:           128/63 mmHg Patient Gender: M                HR:           74 bpm. Exam Location:  Inpatient Procedure: 2D Echo, Cardiac Doppler, Color Doppler and Strain Analysis Indications:    CVA  History:        Patient has no prior history of Echocardiogram examinations.                 Risk Factors:Hypertension, Dyslipidemia and  Diabetes.  Sonographer:    Luisa Hart RDCS Referring Phys: 45 Doreatha Lew [facility]  Sonographer Comments: Suboptimal parasternal window, suboptimal apical window and suboptimal subcostal window. IMPRESSIONS  1. Left ventricular ejection fraction, by estimation, is 60 to 65%. The left ventricle has normal function. The left ventricle has no regional wall motion abnormalities. Left ventricular diastolic parameters are consistent with Grade I diastolic dysfunction (impaired relaxation).  2. Right ventricular systolic function is normal. The right ventricular size is normal.  3. Shaggy mitral valve - likely degenerative. Consider TEE for further evaluation. . The mitral valve is degenerative. Mild mitral valve regurgitation. No evidence of mitral stenosis.  4. The aortic valve is normal in structure. Aortic valve regurgitation is not visualized. No aortic stenosis is present.  5. The inferior vena cava is normal in size with greater than 50% respiratory  variability, suggesting right atrial pressure of 3 mmHg. Comparison(s): No prior Echocardiogram. FINDINGS  Left Ventricle: Left ventricular ejection fraction, by estimation, is 60 to 65%. The left ventricle has normal function. The left ventricle has no regional wall motion abnormalities. Global longitudinal strain performed but not reported based on interpreter judgement due to suboptimal tracking. The left ventricular internal cavity size was normal in size. There is no left ventricular hypertrophy. Left ventricular diastolic parameters are consistent with Grade I diastolic dysfunction (impaired relaxation). Right Ventricle: The right ventricular size is normal. No increase in right ventricular wall thickness. Right ventricular systolic function is normal. Left Atrium: Left atrial size was normal in size. Right Atrium: Right atrial size was normal in size. Pericardium: There is no evidence of pericardial effusion. Mitral Valve: Shaggy mitral valve - likely degenerative. Consider TEE for further evaluation. The mitral valve is degenerative in appearance. There is mild thickening of the mitral valve leaflet(s). Mild mitral valve regurgitation. No evidence of mitral valve stenosis. Tricuspid Valve: The tricuspid valve is normal in structure. Tricuspid valve regurgitation is not demonstrated. No evidence of tricuspid stenosis. Aortic Valve: The aortic valve is normal in structure. Aortic valve regurgitation is not visualized. No aortic stenosis is present. Aortic valve mean gradient measures 2.0 mmHg. Aortic valve peak gradient measures 4.1 mmHg. Pulmonic Valve: The pulmonic valve was normal in structure. Pulmonic valve regurgitation is not visualized. No evidence of pulmonic stenosis. Aorta: The aortic root is normal in size and structure. Venous: The inferior vena cava is normal in size with greater than 50% respiratory variability, suggesting right atrial pressure of 3 mmHg. IAS/Shunts: No atrial level shunt  detected by color flow Doppler.  LEFT VENTRICLE PLAX 2D LVIDd:         4.00 cm     Diastology LVIDs:         2.50 cm     LV e' medial:    5.33 cm/s LV PW:         0.80 cm     LV E/e' medial:  15.0 LV IVS:        0.80 cm     LV e' lateral:   7.34 cm/s                            LV E/e' lateral: 10.9  LV Volumes (MOD) LV vol d, MOD A2C: 63.2 ml LV vol d, MOD A4C: 70.1 ml LV vol s, MOD A2C: 15.1 ml LV vol s, MOD A4C: 22.7 ml LV SV MOD A2C:     48.1 ml LV SV MOD A4C:  70.1 ml LV SV MOD BP:      47.8 ml RIGHT VENTRICLE RV Basal diam:  2.60 cm RV Mid diam:    2.10 cm LEFT ATRIUM             Index        RIGHT ATRIUM           Index LA Vol (A2C):   45.6 ml 26.68 ml/m  RA Area:     11.30 cm LA Vol (A4C):   27.1 ml 15.86 ml/m  RA Volume:   26.20 ml  15.33 ml/m LA Biplane Vol: 38.2 ml 22.35 ml/m  AORTIC VALVE                   PULMONIC VALVE AV Vmax:           101.00 cm/s PV Vmax:       0.74 m/s AV Vmean:          61.300 cm/s PV Vmean:      52.900 cm/s AV VTI:            0.184 m     PV VTI:        0.121 m AV Peak Grad:      4.1 mmHg    PV Peak grad:  2.2 mmHg AV Mean Grad:      2.0 mmHg    PV Mean grad:  1.0 mmHg LVOT Vmax:         75.70 cm/s LVOT Vmean:        44.400 cm/s LVOT VTI:          0.153 m LVOT/AV VTI ratio: 0.83  AORTA Ao Asc diam: 4.10 cm MITRAL VALVE MV Area (PHT): 3.81 cm    SHUNTS MV Decel Time: 199 msec    Systemic VTI: 0.15 m MV E velocity: 79.70 cm/s MV A velocity: 92.10 cm/s MV E/A ratio:  0.87 Candee Furbish MD Electronically signed by Candee Furbish MD Signature Date/Time: 01/25/2022/1:54:02 PM    Final      PHYSICAL EXAM  Temp:  [97.6 F (36.4 C)-98.6 F (37 C)] 97.8 F (36.6 C) (02/22 1225) Pulse Rate:  [59-101] 73 (02/22 1225) Resp:  [10-20] 20 (02/22 1225) BP: (97-197)/(63-127) 197/90 (02/22 1225) SpO2:  [93 %-100 %] 100 % (02/22 1225) Weight:  [66 kg] 66 kg (02/21 1629)  General - Well nourished, well developed, in no apparent distress.  Ophthalmologic - fundi not visualized due to  noncooperation.  Cardiovascular - Regular rhythm and rate.  Mental Status -  Level of arousal and orientation to time, place, and person were intact. Language including expression, naming, repetition, comprehension was assessed and found intact. Fund of Knowledge was assessed and was intact.  Cranial Nerves II - XII - II - Visual field intact OU. III, IV, VI - Extraocular movements intact. V - Facial sensation intact bilaterally. VII - Facial movement intact bilaterally. VIII - Hearing & vestibular intact bilaterally. X - Palate elevates symmetrically. XI - Chin turning & shoulder shrug intact bilaterally. XII - Tongue protrusion intact.  Motor Strength - The patients strength was symmetrical at least 4/5 in all extremities and pronator drift was absent.  Bulk was normal and fasciculations were absent.   Motor Tone - Muscle tone was assessed at the neck and appendages and was normal.  Reflexes - The patients reflexes were diminished in all extremities and he had no pathological reflexes.  Sensory - Light touch, temperature/pinprick were assessed and were symmetrical.  Coordination - The patient had normal movements in the right hand and foot with no ataxia or dysmetria.  Mild dysmetria on the left FTN and HTS. Tremor was absent.  Gait and Station - deferred.   ASSESSMENT/PLAN Kenneth [name] is a 61 y.o. male with history of melanoma with brain and lung mets s/p chemo and gamma knife but was told cancer free 3 years ago, HTN, DM, HLD, alcohol abuse admitted for fall x 2 at home with imbalance, BLE weakness and numbness.  Stroke:  acute right thalamic and subacute left paramedian pontine infarcts, likely secondary to synchronized small vessel infarcts given risk factors. CT unremarkable MRI  acute right thalamic and subacute left paramedian pontine infarcts CTA head and neck unremarkable.  MRI with contrast no mets 2D Echo  EF 60-65% LE venous doppler  pending Recommend 30 day cardiac event monitoring to rule out afib given two separate location of stroke LDL 47 HgbA1c pending UDS pending lovenox for VTE prophylaxis No antithrombotic prior to admission, now on aspirin 81 mg daily and clopidogrel 75 mg daily DAPT for 3 weeks and the ASA alone. Patient counseled to be compliant with his antithrombotic medications Ongoing aggressive stroke risk factor management Therapy recommendations:  HH Disposition:  pending  Diabetes HgbA1c pending goal < 7.0 CBG monitoring SSI DM education and close PCP follow up  Hypertension Stable Long term BP goal normotensive  Hyperlipidemia Home meds: none LDL 47, goal < 70 Now on no statin given LDL at goal   Alcohol abuse Cutting down but still takes 3x12Oz daily MVI/B1/FA CIWA protocol Limiting alcohol education provided.  Other Stroke Risk Factors   Other Active Problems history of melanoma with left frontal  of brain and lung mets s/p chemo and gamma knife but was told cancer free 3 years ago.  Hospital day # 0  Neurology will sign off. Please call with questions. Pt will follow up with stroke clinic NP at [facility] in about 4 weeks. Thanks for the consult.   Rosalin Hawking, MD PhD Stroke Neurology 01/25/2022 1:57 PM    To contact Stroke Continuity provider, please refer to [email or web address] After hours, contact General Neurology

## 2022-01-26 DIAGNOSIS — E871 Hypo-osmolality and hyponatremia: Secondary | ICD-10-CM | POA: Diagnosis present

## 2022-01-26 DIAGNOSIS — I959 Hypotension, unspecified: Secondary | ICD-10-CM | POA: Diagnosis present

## 2022-01-26 DIAGNOSIS — Z85820 Personal history of malignant melanoma of skin: Secondary | ICD-10-CM | POA: Diagnosis not present

## 2022-01-26 DIAGNOSIS — E1142 Type 2 diabetes mellitus with diabetic polyneuropathy: Secondary | ICD-10-CM | POA: Diagnosis present

## 2022-01-26 DIAGNOSIS — Z85118 Personal history of other malignant neoplasm of bronchus and lung: Secondary | ICD-10-CM | POA: Diagnosis not present

## 2022-01-26 DIAGNOSIS — R634 Abnormal weight loss: Secondary | ICD-10-CM | POA: Diagnosis present

## 2022-01-26 DIAGNOSIS — I639 Cerebral infarction, unspecified: Secondary | ICD-10-CM | POA: Diagnosis present

## 2022-01-26 DIAGNOSIS — I10 Essential (primary) hypertension: Secondary | ICD-10-CM | POA: Diagnosis present

## 2022-01-26 DIAGNOSIS — Z85841 Personal history of malignant neoplasm of brain: Secondary | ICD-10-CM | POA: Diagnosis not present

## 2022-01-26 DIAGNOSIS — Z9114 Patient's other noncompliance with medication regimen: Secondary | ICD-10-CM | POA: Diagnosis not present

## 2022-01-26 DIAGNOSIS — Z79899 Other long term (current) drug therapy: Secondary | ICD-10-CM | POA: Diagnosis not present

## 2022-01-26 DIAGNOSIS — Z9221 Personal history of antineoplastic chemotherapy: Secondary | ICD-10-CM | POA: Diagnosis not present

## 2022-01-26 DIAGNOSIS — Z91048 Other nonmedicinal substance allergy status: Secondary | ICD-10-CM | POA: Diagnosis not present

## 2022-01-26 DIAGNOSIS — N179 Acute kidney failure, unspecified: Secondary | ICD-10-CM | POA: Diagnosis present

## 2022-01-26 DIAGNOSIS — Z20822 Contact with and (suspected) exposure to covid-19: Secondary | ICD-10-CM | POA: Diagnosis present

## 2022-01-26 DIAGNOSIS — F1010 Alcohol abuse, uncomplicated: Secondary | ICD-10-CM | POA: Diagnosis present

## 2022-01-26 DIAGNOSIS — F17220 Nicotine dependence, chewing tobacco, uncomplicated: Secondary | ICD-10-CM | POA: Diagnosis present

## 2022-01-26 DIAGNOSIS — Z7984 Long term (current) use of oral hypoglycemic drugs: Secondary | ICD-10-CM | POA: Diagnosis not present

## 2022-01-26 DIAGNOSIS — E785 Hyperlipidemia, unspecified: Secondary | ICD-10-CM | POA: Diagnosis present

## 2022-01-26 DIAGNOSIS — Z923 Personal history of irradiation: Secondary | ICD-10-CM | POA: Diagnosis not present

## 2022-01-26 DIAGNOSIS — E876 Hypokalemia: Secondary | ICD-10-CM | POA: Diagnosis present

## 2022-01-26 DIAGNOSIS — I6381 Other cerebral infarction due to occlusion or stenosis of small artery: Secondary | ICD-10-CM | POA: Diagnosis present

## 2022-01-26 DIAGNOSIS — R296 Repeated falls: Secondary | ICD-10-CM | POA: Diagnosis present

## 2022-01-26 LAB — GLUCOSE, CAPILLARY
Glucose-Capillary: 173 mg/dL — ABNORMAL HIGH (ref 70–99)
Glucose-Capillary: 136 mg/dL — ABNORMAL HIGH (ref 70–99)

## 2022-01-26 LAB — HOMOCYSTEINE: Homocysteine: 44.4 umol/L — ABNORMAL HIGH (ref 0.0–14.5)

## 2022-01-26 MED ORDER — AMLODIPINE BESYLATE 10 MG PO TABS: 10.0000 mg | ORAL_TABLET | Freq: Every day | ORAL | 0 refills | Status: DC

## 2022-01-26 MED ORDER — ADULT MULTIVITAMIN W/MINERALS CH: 1.0000 | ORAL_TABLET | Freq: Every day | ORAL | Status: AC

## 2022-01-26 MED ORDER — CLOPIDOGREL BISULFATE 75 MG PO TABS: 75.0000 mg | ORAL_TABLET | Freq: Every day | ORAL | 0 refills | Status: DC

## 2022-01-26 MED ORDER — HYDROMORPHONE HCL 1 MG/ML IJ SOLN
INTRAMUSCULAR | Status: AC
  Filled 2022-01-26: qty 1

## 2022-01-26 MED ORDER — ASPIRIN 81 MG PO TBEC: 81.0000 mg | DELAYED_RELEASE_TABLET | Freq: Every day | ORAL | 11 refills | Status: DC

## 2022-01-26 MED ORDER — METFORMIN HCL 1000 MG PO TABS: 1000.0000 mg | ORAL_TABLET | Freq: Two times a day (BID) | ORAL | 0 refills | Status: DC

## 2022-01-26 MED ORDER — METOPROLOL TARTRATE 100 MG PO TABS: 100.0000 mg | ORAL_TABLET | Freq: Two times a day (BID) | ORAL | 0 refills | Status: DC

## 2022-01-26 MED ORDER — ATORVASTATIN CALCIUM 20 MG PO TABS: 20.0000 mg | ORAL_TABLET | Freq: Every day | ORAL | 0 refills | Status: DC

## 2022-01-26 NOTE — TOC Transition Note (Signed)
Transition of Care [facility]) - CM/SW Discharge Note   Patient Details  Name: Kenneth [name] MRN: [id number] Date of Birth: 05-Jan-1961  [facility] CM/SW Contact:  Pollie Friar, RN Phone Number: 01/26/2022, 10:56 AM   Clinical Narrative:    Patient is discharging to sister, Kenneth [name]'s home at: [address] in [place]. Walker for home is at the bedside. Family to provide transport home around 12 pm.  CM has submitted referral for assistance with repairs that need to be done at pts home.     Final next level of care: OP Rehab Barriers to Discharge: No Barriers Identified   Patient Goals and CMS Choice     Choice offered to / list presented to : Patient  Discharge Placement                       Discharge Plan and Services   Discharge Planning Services: CM Consult Post Acute Care Choice: Durable Medical Equipment          DME Arranged: Walker rolling DME Agency: [employer] Date DME Agency Contacted: 01/25/22   Representative spoke with at DME Agency: [place] ([place]) Interventions     Readmission Risk Interventions No flowsheet data found.

## 2022-01-26 NOTE — Progress Notes (Signed)
Physical Therapy Treatment Patient Details Name: Kenneth [name] MRN: [id number] DOB: 12/03/1961 Today's Date: 01/26/2022   History of Present Illness Kenneth [name] is a 61 y.o. male who presented to the ED for evaluation prolonged course of feeling unwell and specifically with dizziness, imbalance and sustaining multiple falls. 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 Past medical history of metastatic melanoma to lung and brain, diabetes, hypertension, hyperlipidemia, alcohol abuse    PT Comments    Pt required min guard assist transfers and in room ambulation with RW. Pt declining hallway ambulation, stating people have been in and out all morning and he just wants to take a nap. Home RW height adjusted and glide caps applied. Pt's insurance in out of network for all local home health agencies. Pt's sister is able to provide transportation to [place]. Recommendations updated.     Recommendations for follow up therapy are one component of a multi-disciplinary discharge planning process, led by the attending physician.  Recommendations may be updated based on patient status, additional functional criteria and insurance authorization.  Follow Up Recommendations  Outpatient PT     Assistance Recommended at Discharge Frequent or constant Supervision/Assistance  Patient can return home with the following A little help with walking and/or transfers;A little help with bathing/dressing/bathroom;Assist for transportation;Help with stairs or ramp for entrance   Equipment Recommendations  Rolling walker (2 wheels)    Recommendations for Other Services       Precautions / Restrictions Precautions Precautions: Fall     Mobility  Bed Mobility Overal bed mobility: Modified Independent             General bed mobility comments: HOB elevated, +rail    Transfers Overall transfer level: Needs assistance Equipment used: Rolling walker (2  wheels) Transfers: Sit to/from Stand Sit to Stand: Min guard           General transfer comment: cues for hand placement    Ambulation/Gait Ambulation/Gait assistance: Min guard Gait Distance (Feet): 50 Feet Assistive device: Rolling walker (2 wheels) Gait Pattern/deviations: Step-through pattern, Decreased stride length Gait velocity: decreased Gait velocity interpretation: <1.31 ft/sec, indicative of household ambulator   General Gait Details: Ambulated in room to determine proper height for home RW. Pt declining hallway ambulation due to wantint to take a nap.   Stairs             Wheelchair Mobility    Modified Rankin (Stroke Patients Only) Modified Rankin (Stroke Patients Only) Pre-Morbid Rankin Score: Moderate disability Modified Rankin: Moderately severe disability     Balance Overall balance assessment: Needs assistance, History of Falls Sitting-balance support: Feet supported, No upper extremity supported Sitting balance-Leahy Scale: Good     Standing balance support: Bilateral upper extremity supported, During functional activity, No upper extremity supported Standing balance-Leahy Scale: Fair Standing balance comment: static stand without UE support                            Cognition Arousal/Alertness: Awake/alert Behavior During Therapy: WFL for tasks assessed/performed Overall Cognitive Status: No family/caregiver present to determine baseline cognitive functioning                                 General Comments: Following commands appropriately. Limited insightn to safety and deficits.        Exercises      General Comments General  comments (skin integrity, edema, etc.): RW for home delivered to room. Adjusted height and switched rubber tips for glide caps.      Pertinent Vitals/Pain Pain Assessment Pain Assessment: No/denies pain    Home Living                          Prior Function             PT Goals (current goals can now be found in the care plan section) Acute Rehab PT Goals Patient Stated Goal: home Progress towards PT goals: Progressing toward goals    Frequency    Min 4X/week      PT Plan Discharge plan needs to be updated    Co-evaluation              AM-PAC PT "6 Clicks" Mobility   Outcome Measure  Help needed turning from your back to your side while in a flat bed without using bedrails?: None Help needed moving from lying on your back to sitting on the side of a flat bed without using bedrails?: None Help needed moving to and from a bed to a chair (including a wheelchair)?: A Little Help needed standing up from a chair using your arms (e.g., wheelchair or bedside chair)?: A Little Help needed to walk in hospital room?: A Little Help needed climbing 3-5 steps with a railing? : A Lot 6 Click Score: 19    End of Session Equipment Utilized During Treatment: Gait belt Activity Tolerance: Patient tolerated treatment well Patient left: in bed;with call bell/phone within reach;with bed alarm set Nurse Communication: Mobility status PT Visit Diagnosis: Unsteadiness on feet (R26.81);Muscle weakness (generalized) (M62.81);History of falling (Z91.81)     Time: 5397-6734 PT Time Calculation (min) (ACUTE ONLY): 12 min  Charges:  $Gait Training: 8-22 mins                     Kenneth [name], PT  Office # [phone] Pager [phone]    Lorriane Shire 01/26/2022, 12:23 PM

## 2022-01-26 NOTE — Discharge Summary (Signed)
Physician Discharge Summary  Kenneth [name] NFA:[id number] DOB: 09-01-1961 DOA: 01/24/2022  PCP: Isaac Bliss, Rayford Halsted, MD  Admit date: 01/24/2022 Discharge date: 01/26/2022  Time spent: 35 minutes  Recommendations for Outpatient Follow-up:  F/u pcp one week at which point would repeat a BMP to monitor renal function and potassium levels along with sodium levels F/u neurology 2-4 weeks    Discharge Diagnoses:  Principal Problem:   Acute CVA (cerebrovascular accident) [facility]) Active Problems:   Diabetes mellitus without complication ([place])   Essential hypertension   Metastatic melanoma to lung ([place])   Hypokalemia   Dizziness   ARF (acute renal failure) (HCC)   Hyponatremia   CVA (cerebral vascular accident) [facility])   Discharge Condition: Good   Filed Weights   01/24/22 1629  Weight: 66 kg    History of present illness:  HPI: Kenneth [name] is a 61 y.o. male with history of hypertension, hyperlipidemia, diabetes mellitus and medically melanoma treated surgically at [facility] who has been not following with his primary care physician almost for 3 years and had last visit in December 2022 when patient was restarted on his antihypertensives at that time patient also indicated pain in his both lower extremities which is burning in sensation and also lost 60 pounds of weight presents to the ER after patient has been having multiple falls with dizziness over the last few days.  Today patient had 2 falls 1 when he was trying to walk another 1 when he was try to get up from the commode.  Did not hit his head or lose consciousness.  Patient's history also provided the history states that there has been no running water in his house for many days now.   ED Course: In the ER patient is mildly tremulous but able to move all extremities 5 x 5.  Has some skin changes in the lower extremities.  No facial admitted tongue is midline CT head is unremarkable.  Neurology is recommending getting MRI  brain which showed acute infarct of the right thalamus and left pons.  Patient did pass stroke swallow.  In addition patient labs show acute renal failure with creatinine of 1.8 and sodium of 120 low magnesium and potassium.  Patient also was in the mild hypertensive range for which patient was given fluid bolus.  Patient admitted for acute CVA hyponatremia acute renal failure and poor social condition.  Hospital Course:  Acute CVA -confirmed on MRI.  CT angiogram of the head and neck negative for LVO.  Completed 2D echo EF 65% no major valvular abnormalities, .  MRI with brain was also negative for any mets.  Patient now on DAPT, aspirin and Plavix, neurology recommends continuing for 3 weeks and then only aspirin.  He is on statin already as well.  PT OT has seen him and they have recommended home health plan is to have him go to his sister's house tomorrow with arrangements for home health PT OT.  Neurology on board.  Neurology is also looking at ruling out DVT.  If he rules in for DVT, neurology recommends hypercoagulable work-up.  DVT negative on ultrasound bilaterally.   Polyneuropathy for which neurologist has recommended getting SPEP, TSH, B12, folate, thiamine levels which are pending.  High-dose thiamine has been ordered.   Alcohol abuse: .  No signs of withdrawal at this point in time, continue    Acute renal failure: likely from poor oral intake and in addition patient also was hypotensive and also was  using lisinopril and hydrochlorothiazide.  Presented with creatinine 1.81.  Currently improving and 1.46.  Continue IV fluids.  Avoid nephrotoxic agents.  Resume lisinopril and HCTZ at discharge.    Hyponatremia: Could be multifactorial including use of hydrochlorothiazide also beer potomania and hypovolemia.  Sodium further down to 122 but patient is asymptomatic.  Continue normal saline.    Diabetes mellitus type 2: has not been taking medicines for many months now.  Last hemoglobin A1c just  2 months ago was 6.1.  Hemoglobin A1c pending.  Only continue metformin at discharge.  History of metastatic melanoma has had surgical treatment previously including radiation to the brain.  He stopped following up with his oncologist at [facility].  Will need follow-up with oncologist.    Poor social condition -patient has no water electricity at home.  Social worker on board.  Going to stay with his sister.   Hypomagnesemia: Resolved.  Medical noncompliance: Patient encouraged to continue to take his medications as he has been off of all of his meds for several months.  Discharge Exam: Vitals:   01/26/22 0750 01/26/22 1115  BP: (!) 176/81 (!) 160/82  Pulse: 91 88  Resp:  18  Temp: 98.5 F (36.9 C) 98.9 F (37.2 C)  SpO2: 97% 98%    General: Alert and oriented no apparent distress Cardiovascular: Regular rate and rhythm without murmurs rubs or gallops Respiratory: Clear to auscultation bilaterally no wheezes rhonchi rales  Discharge Instructions   Discharge Instructions     Ambulatory referral to Neurology   Complete by: As directed    Follow up with stroke clinic NP (Jessica Vanschaick or Cecille Rubin, if both not available, consider Zachery Dauer, or Ahern) at [facility] in about 4 weeks. Thanks.   Ambulatory referral to Occupational Therapy   Complete by: As directed    Ambulatory referral to Physical Therapy   Complete by: As directed    Diet - low sodium heart healthy   Complete by: As directed    Diet Carb Modified   Complete by: As directed    Increase activity slowly   Complete by: As directed       Allergies as of 01/26/2022       Reactions   Cat Hair Extract Shortness Of Breath, Swelling   Swelling, watery eyes        Medication List     STOP taking these medications    amLODipine 10 MG tablet Commonly known as: NORVASC   glimepiride 2 MG tablet Commonly known as: AMARYL   metoprolol tartrate 100 MG tablet Commonly known as: LOPRESSOR    pioglitazone 45 MG tablet Commonly known as: ACTOS   potassium chloride SA 20 MEQ tablet Commonly known as: KLOR-CON M       TAKE these medications    aspirin 81 MG EC tablet Take 1 tablet (81 mg total) by mouth daily. Swallow whole. Start taking on: January 27, 2022   atorvastatin 20 MG tablet Commonly known as: LIPITOR Take 1 tablet (20 mg total) by mouth daily.   clopidogrel 75 MG tablet Commonly known as: Plavix Take 1 tablet (75 mg total) by mouth daily.   lisinopril-hydrochlorothiazide 20-12.5 MG tablet Commonly known as: ZESTORETIC Take 2 tablets by mouth daily.   metFORMIN 1000 MG tablet Commonly known as: GLUCOPHAGE Take 1 tablet (1,000 mg total) by mouth 2 (two) times daily with a meal. What changed: See the new instructions.   multivitamin with minerals Tabs tablet Take 1 tablet by mouth  daily. Start taking on: January 27, 2022               Durable Medical Equipment  (From admission, onward)           Start     Ordered   01/25/22 1343  For home use only DME Walker rolling  Once       Question Answer Comment  Walker: With [place]   Patient needs a walker to treat with the following condition Stroke ([place])      01/25/22 1407           Allergies  Allergen Reactions   Cat Hair Extract Shortness Of Breath and Swelling    Swelling, watery eyes    Follow-up Information     [facility]. Schedule an appointment as soon as possible for a visit in 1 month(s).   Specialty: Neurology Why: stroke clinic Contact information: [address] [place]. Schedule an appointment as soon as possible for a visit in 1 week(s).   Specialty: Rehabilitation Contact information: [place]. [phone] [place] [phone]        [facility]. Schedule an appointment as soon as  possible for a visit.   Why: For outpatient counseling Contact information: [address]  [place] [address] [phone]                  The results of significant diagnostics from this hospitalization (including imaging, microbiology, ancillary and laboratory) are listed below for reference.    Significant Diagnostic Studies: CT ANGIO HEAD NECK W WO CM  Result Date: 01/25/2022 CLINICAL DATA:  Stroke/TIA evaluate for embolic source EXAM: CT ANGIOGRAPHY HEAD AND NECK TECHNIQUE: Multidetector CT imaging of the head and neck was performed using the standard protocol during bolus administration of intravenous contrast. Multiplanar CT image reconstructions and MIPs were obtained to evaluate the vascular anatomy. Carotid stenosis measurements (when applicable) are obtained utilizing NASCET criteria, using the distal internal carotid diameter as the denominator. RADIATION DOSE REDUCTION: This exam was performed according to the departmental dose-optimization program which includes automated exposure control, adjustment of the mA and/or kV according to patient size and/or use of iterative reconstruction technique. CONTRAST:  11mL OMNIPAQUE IOHEXOL 350 MG/ML SOLN COMPARISON:  No prior CTA, correlation is made with CT and MRI head 01/24/2022 FINDINGS: CT HEAD FINDINGS For noncontrast findings, please see 01/24/2022 CT head. CTA NECK FINDINGS Aortic arch: 4 vessel arch with aberrant origin of the right subclavian artery, which passes posterior to the esophagus. Imaged portion shows no evidence of aneurysm or dissection. No significant stenosis of the major arch vessel origins. Aortic atherosclerosis. Right carotid system: No evidence of dissection, stenosis (50% or greater) or occlusion. Left carotid system: No evidence of dissection, stenosis (50% or greater) or occlusion. Vertebral arteries: No evidence of dissection, stenosis (50% or greater) or occlusion. Left dominant system with  quite diminutive right vertebral artery for the entirety of its course. Skeleton: No acute osseous abnormality. Straightening and mild reversal of the normal cervical lordosis. Other neck: Negative. Upper chest: Negative. Review of the MIP images confirms the above findings CTA HEAD FINDINGS Anterior circulation: Both internal carotid arteries are patent to the termini, without significant stenosis. A1 segments patent. Normal anterior communicating artery. Anterior cerebral arteries are patent to their distal aspects. No M1 stenosis or occlusion. Normal MCA  bifurcations. Distal MCA branches perfused and symmetric. Posterior circulation: Vertebral arteries patent to the vertebrobasilar junction without stenosis. Posterior inferior cerebral arteries patent bilaterally. Basilar patent to its distal aspect. Superior cerebellar arteries patent bilaterally. Patent P1 segments. PCAs perfused to their distal aspects without stenosis. The bilateral posterior communicating arteries are visualized. Venous sinuses: As permitted by contrast timing, patent. Anatomic variants: None significant Review of the MIP images confirms the above findings IMPRESSION: 1.  No intracranial large vessel occlusion or significant stenosis. 2.  No hemodynamically significant stenosis in the neck. 3. Incidental note is made of an aberrant origin of the right subclavian artery. Electronically Signed   By: Merilyn Baba M.D.   On: 01/25/2022 02:28   CT Head Wo Contrast  Result Date: 01/24/2022 CLINICAL DATA:  Headache, chronic, new features or increased frequency EXAM: CT HEAD WITHOUT CONTRAST TECHNIQUE: Contiguous axial images were obtained from the base of the skull through the vertex without intravenous contrast. RADIATION DOSE REDUCTION: This exam was performed according to the departmental dose-optimization program which includes automated exposure control, adjustment of the mA and/or kV according to patient size and/or use of iterative  reconstruction technique. COMPARISON:  None. BRAIN: BRAIN Cerebral ventricle sizes are concordant with the degree of cerebral volume loss. Patchy and confluent areas of decreased attenuation are noted throughout the deep and periventricular white matter of the cerebral hemispheres bilaterally, compatible with chronic microvascular ischemic disease. Bilateral thalami chronic lacunar infarctions. Chronic left basal ganglia lacunar infarction. No evidence of large-territorial acute infarction. No parenchymal hemorrhage. No mass lesion. No extra-axial collection. No mass effect or midline shift. No hydrocephalus. Basilar cisterns are patent. Vascular: No hyperdense vessel. Skull: No acute fracture or focal lesion. Left temporal craniotomy surgical changes. Sinuses/Orbits: Paranasal sinuses and mastoid air cells are clear. The orbits are unremarkable. Other: None. IMPRESSION: No acute intracranial abnormality in a patient with chronic microvascular ischemic changes and chronic lacunar infarctions. Electronically Signed   By: Iven Finn M.D.   On: 01/24/2022 18:36   MR BRAIN WO CONTRAST  Result Date: 01/25/2022 CLINICAL DATA:  Dizziness EXAM: MRI HEAD WITHOUT CONTRAST TECHNIQUE: Multiplanar, multiecho pulse sequences of the brain and surrounding structures were obtained without intravenous contrast. COMPARISON:  No prior MRI, correlation is made with CT head 01/24/2022 FINDINGS: Brain: 7 mm area of restricted diffusion with ADC correlate in the right thalamus, consistent with acute infarct. Additional possible area of mildly increased signal on diffusion-weighted imaging with less distinct ADC correlate is seen in the left pons, adjacent to a more remote lacunar infarct (series 2, image 16 and series 250, image 16), possibly a subacute infarct. No acute hemorrhage, mass, mass effect, or midline shift. Additional chronic lacunar infarcts in the left thalamus, left pons, bilateral basal ganglia, and corona radiata,  with a remote cortical infarct in the left frontal lobe (series 6, image 19). Confluent T2 hyperintense signal in the periventricular white matter and pons, likely the sequela of moderate to severe chronic small vessel ischemic disease. Vascular: Normal flow voids. Skull and upper cervical spine: Normal marrow signal. Sinuses/Orbits: Negative. Other: None. IMPRESSION: 1. Acute infarct in the right thalamus, with possible subacute infarct in the left pons, adjacent to a more remote lacunar pontine infarct. 2. Sequela of remote cortical infarct in the left frontal lobe and additional remote lacunar infarcts in the left thalamus, bilateral basal ganglia, and corona radiata. Electronically Signed   By: Merilyn Baba M.D.   On: 01/25/2022 00:06   MR BRAIN W CONTRAST  Result Date: 01/25/2022 CLINICAL DATA:  Assess for metastatic disease. History of metastatic melanoma. EXAM: MRI HEAD WITH CONTRAST TECHNIQUE: Multiplanar, multiecho pulse sequences of the brain and surrounding structures were obtained with intravenous contrast. CONTRAST:  32mL GADAVIST GADOBUTROL 1 MMOL/ML IV SOLN COMPARISON:  Noncontrast brain MRI from yesterday FINDINGS: Brain: No enhancement to suggest metastatic disease. No worrisome enhancement along the left craniotomy and frontal encephalomalacia. Ischemic injuries as described on prior brain MRI. Vascular: Unremarkable vascular enhancements Skull and upper cervical spine: Unremarkable left craniotomy. Sinuses/Orbits: Negative Other: Diffuse mild motion artifact with image blurring. IMPRESSION: Negative for metastatic disease to the brain. Electronically Signed   By: Jorje Guild M.D.   On: 01/25/2022 10:34   DG CHEST PORT 1 VIEW  Result Date: 01/25/2022 CLINICAL DATA:  Stroke EXAM: PORTABLE CHEST 1 VIEW COMPARISON:  Chest CT 11/10/2019 FINDINGS: Normal heart size and mediastinal contours. No acute infiltrate or edema. No effusion or pneumothorax. No acute osseous findings. Healing lateral  left seventh and eighth rib fractures with callus. IMPRESSION: 1. No active disease. 2. Healing left seventh and eighth rib fractures. Electronically Signed   By: Jorje Guild M.D.   On: 01/25/2022 06:11   ECHOCARDIOGRAM COMPLETE  Result Date: 01/25/2022    ECHOCARDIOGRAM REPORT   Patient Name:   VERMON GRAYS Date of Exam: 01/25/2022 Medical Rec #:  [id number]        Height:       64.0 in Accession #:    [id number]       Weight:       145.5 lb Date of Birth:  October 24, 1961         BSA:          1.709 m Patient Age:    61 years         BP:           128/63 mmHg Patient Gender: M                HR:           74 bpm. Exam Location:  Inpatient Procedure: 2D Echo, Cardiac Doppler, Color Doppler and Strain Analysis Indications:    CVA  History:        Patient has no prior history of Echocardiogram examinations.                 Risk Factors:Hypertension, Dyslipidemia and Diabetes.  Sonographer:    Luisa Hart RDCS Referring Phys: 104 Doreatha Lew [facility]  Sonographer Comments: Suboptimal parasternal window, suboptimal apical window and suboptimal subcostal window. IMPRESSIONS  1. Left ventricular ejection fraction, by estimation, is 60 to 65%. The left ventricle has normal function. The left ventricle has no regional wall motion abnormalities. Left ventricular diastolic parameters are consistent with Grade I diastolic dysfunction (impaired relaxation).  2. Right ventricular systolic function is normal. The right ventricular size is normal.  3. Shaggy mitral valve - likely degenerative. Consider TEE for further evaluation. . The mitral valve is degenerative. Mild mitral valve regurgitation. No evidence of mitral stenosis.  4. The aortic valve is normal in structure. Aortic valve regurgitation is not visualized. No aortic stenosis is present.  5. The inferior vena cava is normal in size with greater than 50% respiratory variability, suggesting right atrial pressure of 3 mmHg. Comparison(s): No prior Echocardiogram.  FINDINGS  Left Ventricle: Left ventricular ejection fraction, by estimation, is 60 to 65%. The left ventricle has normal function. The left ventricle has no regional wall motion abnormalities.  Global longitudinal strain performed but not reported based on interpreter judgement due to suboptimal tracking. The left ventricular internal cavity size was normal in size. There is no left ventricular hypertrophy. Left ventricular diastolic parameters are consistent with Grade I diastolic dysfunction (impaired relaxation). Right Ventricle: The right ventricular size is normal. No increase in right ventricular wall thickness. Right ventricular systolic function is normal. Left Atrium: Left atrial size was normal in size. Right Atrium: Right atrial size was normal in size. Pericardium: There is no evidence of pericardial effusion. Mitral Valve: Shaggy mitral valve - likely degenerative. Consider TEE for further evaluation. The mitral valve is degenerative in appearance. There is mild thickening of the mitral valve leaflet(s). Mild mitral valve regurgitation. No evidence of mitral valve stenosis. Tricuspid Valve: The tricuspid valve is normal in structure. Tricuspid valve regurgitation is not demonstrated. No evidence of tricuspid stenosis. Aortic Valve: The aortic valve is normal in structure. Aortic valve regurgitation is not visualized. No aortic stenosis is present. Aortic valve mean gradient measures 2.0 mmHg. Aortic valve peak gradient measures 4.1 mmHg. Pulmonic Valve: The pulmonic valve was normal in structure. Pulmonic valve regurgitation is not visualized. No evidence of pulmonic stenosis. Aorta: The aortic root is normal in size and structure. Venous: The inferior vena cava is normal in size with greater than 50% respiratory variability, suggesting right atrial pressure of 3 mmHg. IAS/Shunts: No atrial level shunt detected by color flow Doppler.  LEFT VENTRICLE PLAX 2D LVIDd:         4.00 cm     Diastology LVIDs:          2.50 cm     LV e' medial:    5.33 cm/s LV PW:         0.80 cm     LV E/e' medial:  15.0 LV IVS:        0.80 cm     LV e' lateral:   7.34 cm/s                            LV E/e' lateral: 10.9  LV Volumes (MOD) LV vol d, MOD A2C: 63.2 ml LV vol d, MOD A4C: 70.1 ml LV vol s, MOD A2C: 15.1 ml LV vol s, MOD A4C: 22.7 ml LV SV MOD A2C:     48.1 ml LV SV MOD A4C:     70.1 ml LV SV MOD BP:      47.8 ml RIGHT VENTRICLE RV Basal diam:  2.60 cm RV Mid diam:    2.10 cm LEFT ATRIUM             Index        RIGHT ATRIUM           Index LA Vol (A2C):   45.6 ml 26.68 ml/m  RA Area:     11.30 cm LA Vol (A4C):   27.1 ml 15.86 ml/m  RA Volume:   26.20 ml  15.33 ml/m LA Biplane Vol: 38.2 ml 22.35 ml/m  AORTIC VALVE                   PULMONIC VALVE AV Vmax:           101.00 cm/s PV Vmax:       0.74 m/s AV Vmean:          61.300 cm/s PV Vmean:      52.900 cm/s AV VTI:  0.184 m     PV VTI:        0.121 m AV Peak Grad:      4.1 mmHg    PV Peak grad:  2.2 mmHg AV Mean Grad:      2.0 mmHg    PV Mean grad:  1.0 mmHg LVOT Vmax:         75.70 cm/s LVOT Vmean:        44.400 cm/s LVOT VTI:          0.153 m LVOT/AV VTI ratio: 0.83  AORTA Ao Asc diam: 4.10 cm MITRAL VALVE MV Area (PHT): 3.81 cm    SHUNTS MV Decel Time: 199 msec    Systemic VTI: 0.15 m MV E velocity: 79.70 cm/s MV A velocity: 92.10 cm/s MV E/A ratio:  0.87 Candee Furbish MD Electronically signed by Candee Furbish MD Signature Date/Time: 01/25/2022/1:54:02 PM    Final    VAS [place] LOWER EXTREMITY VENOUS (DVT)  Result Date: 01/25/2022  Lower Venous DVT Study Patient Name:  INDIANA PECHACEK  Date of Exam:   01/25/2022 Medical Rec #: [id number]         Accession #:    [id number] Date of Birth: Nov 30, 1961          Patient Gender: M Patient Age:   77 years Exam [facility]  [facility] Procedure:      VAS [place] LOWER EXTREMITY VENOUS (DVT) Referring Phys: Cornelius Moras XU --------------------------------------------------------------------------------  Indications: Stroke.   Comparison Study: no prior Performing Technologist: Archie Patten RVS  Examination Guidelines: A complete evaluation includes B-mode imaging, spectral Doppler, color Doppler, and power Doppler as needed of all accessible portions of each vessel. Bilateral testing is considered an integral part of a complete examination. Limited examinations for reoccurring indications may be performed as noted. The reflux portion of the exam is performed with the patient in reverse Trendelenburg.  +---------+---------------+---------+-----------+----------+--------------+  RIGHT     Compressibility Phasicity Spontaneity Properties Thrombus Aging  +---------+---------------+---------+-----------+----------+--------------+  CFV       Full            Yes       Yes                                    +---------+---------------+---------+-----------+----------+--------------+  SFJ       Full                                                             +---------+---------------+---------+-----------+----------+--------------+  FV Prox   Full                                                             +---------+---------------+---------+-----------+----------+--------------+  FV Mid    Full                                                             +---------+---------------+---------+-----------+----------+--------------+  FV Distal Full                                                             +---------+---------------+---------+-----------+----------+--------------+  PFV       Full                                                             +---------+---------------+---------+-----------+----------+--------------+  POP       Full            Yes       Yes                                    +---------+---------------+---------+-----------+----------+--------------+  PTV       Full                                                             +---------+---------------+---------+-----------+----------+--------------+  PERO      Full                                                              +---------+---------------+---------+-----------+----------+--------------+   +---------+---------------+---------+-----------+----------+--------------+  LEFT      Compressibility Phasicity Spontaneity Properties Thrombus Aging  +---------+---------------+---------+-----------+----------+--------------+  CFV       Full            Yes       Yes                                    +---------+---------------+---------+-----------+----------+--------------+  SFJ       Full                                                             +---------+---------------+---------+-----------+----------+--------------+  FV Prox   Full                                                             +---------+---------------+---------+-----------+----------+--------------+  FV Mid    Full                                                             +---------+---------------+---------+-----------+----------+--------------+  FV Distal Full                                                             +---------+---------------+---------+-----------+----------+--------------+  PFV       Full                                                             +---------+---------------+---------+-----------+----------+--------------+  POP       Full            Yes       Yes                                    +---------+---------------+---------+-----------+----------+--------------+  PTV       Full                                                             +---------+---------------+---------+-----------+----------+--------------+  PERO      Full                                                             +---------+---------------+---------+-----------+----------+--------------+     Summary: BILATERAL: - No evidence of deep vein thrombosis seen in the lower extremities, bilaterally. - RIGHT: - No cystic structure found in the popliteal fossa.  LEFT: - A cystic structure is found in the  popliteal fossa.  *See table(s) above for measurements and observations. Electronically signed by Harold Barban MD on 01/25/2022 at 10:30:42 PM.    Final     Microbiology: Recent Results (from the past 240 hour(s))  Resp Panel by RT-PCR (Flu A&B, Covid) Nasopharyngeal Swab     Status: None   Collection Time: 01/24/22  4:21 PM   Specimen: Nasopharyngeal Swab; Nasopharyngeal(NP) swabs in vial transport medium  Result Value Ref Range Status   SARS Coronavirus 2 by RT PCR NEGATIVE NEGATIVE Final    Comment: (NOTE) SARS-CoV-2 target nucleic acids are NOT DETECTED.  The SARS-CoV-2 RNA is generally detectable in upper respiratory specimens during the acute phase of infection. The lowest concentration of SARS-CoV-2 viral copies this assay can detect is 138 copies/mL. A negative result does not preclude SARS-Cov-2 infection and should not be used as the sole basis for treatment or other patient management decisions. A negative result may occur with  improper specimen collection/handling, submission of specimen other than nasopharyngeal swab, presence of viral mutation(s) within the areas targeted by this assay, and inadequate number of viral copies(<138 copies/mL). A negative result must be combined with clinical observations, patient history, and epidemiological information. The expected result is Negative.  Fact Sheet for Patients:  EntrepreneurPulse.com.au  Fact Sheet for Healthcare  Providers:  IncredibleEmployment.be  This test is no t yet approved or cleared by the [place] and  has been authorized for detection and/or diagnosis of SARS-CoV-2 by FDA under an Emergency Use Authorization (EUA). This EUA will remain  in effect (meaning this test can be used) for the duration of the COVID-19 declaration under Section 564(b)(1) of the Act, 21 U.S.C.section 360bbb-3(b)(1), unless the authorization is terminated  or revoked sooner.        Influenza A by PCR NEGATIVE NEGATIVE Final   Influenza B by PCR NEGATIVE NEGATIVE Final    Comment: (NOTE) The Xpert Xpress SARS-CoV-2/FLU/RSV plus assay is intended as an aid in the diagnosis of influenza from Nasopharyngeal swab specimens and should not be used as a sole basis for treatment. Nasal washings and aspirates are unacceptable for Xpert Xpress SARS-CoV-2/FLU/RSV testing.  Fact Sheet for Patients: EntrepreneurPulse.com.au  Fact Sheet for Healthcare Providers: IncredibleEmployment.be  This test is not yet approved or cleared by the [place] FDA and has been authorized for detection and/or diagnosis of SARS-CoV-2 by FDA under an Emergency Use Authorization (EUA). This EUA will remain in effect (meaning this test can be used) for the duration of the COVID-19 declaration under Section 564(b)(1) of the Act, 21 U.S.C. section 360bbb-3(b)(1), unless the authorization is terminated or revoked.  Performed at [facility], [place] [address] [place], [place] [address]      Labs: Basic Metabolic Panel: Recent Labs  Lab 01/24/22 1747 01/24/22 2200 01/25/22 0235 01/25/22 1053 01/25/22 1418 01/25/22 1942  NA  --  122* 125* 122* 126* 127*  K  --  3.0* 3.0* 3.2* 3.7 4.2  CL  --  83* 87* 83* 85* 90*  CO2  --  28 27 30 30 30   GLUCOSE  --  173* 122* 186* 216* 148*  BUN  --  19 17 16 15 14   CREATININE  --  1.57* 1.38* 1.46* 1.39* 1.36*  CALCIUM  --  8.5* 8.0* 8.7* 9.0 9.0  MG 1.5*  --  1.9  --   --   --    Liver Function Tests: Recent Labs  Lab 01/24/22 1636  AST 18  ALT 15  ALKPHOS 56  BILITOT 1.2  PROT 6.3*  ALBUMIN 3.5   No results for input(s): LIPASE, AMYLASE in the last 168 hours. No results for input(s): AMMONIA in the last 168 hours. CBC: Recent Labs  Lab 01/24/22 1636 01/24/22 2200 01/25/22 0235  WBC 11.9* 7.7 6.1  NEUTROABS 10.3*  --  4.0  HGB 14.1 13.6 12.5*  HCT 37.7* 37.6* 35.3*  MCV 84.7 86.4 87.4   PLT 370 298 282   Cardiac Enzymes: No results for input(s): CKTOTAL, CKMB, CKMBINDEX, TROPONINI in the last 168 hours. BNP: BNP (last 3 results) No results for input(s): BNP in the last 8760 hours.  ProBNP (last 3 results) No results for input(s): PROBNP in the last 8760 hours.  CBG: Recent Labs  Lab 01/25/22 1254 01/25/22 1737 01/25/22 2105 01/26/22 0636 01/26/22 1141  GLUCAP 180* 244* 151* 173* 136*       Signed:  Matison Nuccio A MD.  [facility] 01/26/2022, 2:17 PM

## 2022-01-27 LAB — PROTEIN ELECTROPHORESIS, SERUM
A/G Ratio: 1.3 (ref 0.7–1.7)
Albumin ELP: 2.8 g/dL — ABNORMAL LOW (ref 2.9–4.4)
Alpha-1-Globulin: 0.3 g/dL (ref 0.0–0.4)
Alpha-2-Globulin: 0.6 g/dL (ref 0.4–1.0)
Beta Globulin: 0.7 g/dL (ref 0.7–1.3)
Gamma Globulin: 0.6 g/dL (ref 0.4–1.8)
Globulin, Total: 2.2 g/dL (ref 2.2–3.9)
Total Protein ELP: 5 g/dL — ABNORMAL LOW (ref 6.0–8.5)

## 2022-01-27 LAB — VITAMIN B1: Vitamin B1 (Thiamine): 89.3 nmol/L (ref 66.5–200.0)

## 2022-01-30 ENCOUNTER — Telehealth: Payer: Self-pay | Admitting: Internal Medicine

## 2022-01-30 ENCOUNTER — Other Ambulatory Visit: Payer: Self-pay | Admitting: *Deleted

## 2022-01-30 DIAGNOSIS — F339 Major depressive disorder, recurrent, unspecified: Secondary | ICD-10-CM | POA: Diagnosis not present

## 2022-01-30 NOTE — Telephone Encounter (Signed)
Pt sister call and stated he was in the hospital and they refill all his prescription but one it is clopidogrel 75 mg  it was given to him from the hospital and didn't fill it her # is [phone].

## 2022-01-30 NOTE — Patient Outreach (Signed)
Received a red flag Emmi stroke notification for Mr. Frechette.  I have assigned Valente David, RN to call for follow up and determine if there are any Case Management needs.   [facility], [place], [place] Management [phone]

## 2022-01-30 NOTE — Patient Outreach (Signed)
[facility]) Care Management  01/30/2022  Kenneth [name] 1961-05-31 [id number]   RED ON EMMI ALERT - Stroke Day # 1 Date: 2/25 Red Alert Reason: Scheduled follow up appointment?  I DON'T KNOW   Outreach attempt #1, successful to member and sister.  Identity verified.  This care manager introduced self and stated purpose of call.  [facility] care management services explained.    Member is currently living with sister.  She immediately begins to question medications.  State out of medications that were prescribed at discharge, she was able to get all from the pharmacy with the exception of Plavix.  Sister report she has reached out to PCP office to inquire about medication.  Advised that she could also call neurology office to obtain prescription.  Conversation abruptly ended at that time as sister state she had to hang up.  Plan: RN CM will follow up within the next 2 business days.  Valente David, RN, MSN, [place] Manager [phone]

## 2022-01-31 ENCOUNTER — Other Ambulatory Visit: Payer: Self-pay | Admitting: *Deleted

## 2022-01-31 ENCOUNTER — Telehealth: Payer: Self-pay | Admitting: Family Medicine

## 2022-01-31 NOTE — Telephone Encounter (Signed)
Pt is wanting to transfer his care from Dr. Jerilee Hoh to Dr. Gena Fray because [facility] is a much closer drive. Please let me know and I can schedule a toc for pt.

## 2022-01-31 NOTE — Telephone Encounter (Signed)
Last filled by Phillips Grout, MD.  Faythe Ghee to fill?

## 2022-01-31 NOTE — Telephone Encounter (Signed)
Kenneth [name] with [facility] is checking on the refill for plavix. Pt has been out of medication since d/c on 01-26-2022  [id number] - [place], [place] - [address] Phone:  [phone]  Fax:  [phone]

## 2022-01-31 NOTE — Patient Outreach (Signed)
[facility]) Care Management  01/31/2022  Kenneth [name] [birth year] [id number]   Outgoing call placed to member's sister, successful.  She apologizes for abruptly ending the call yesterday, state she was on hold with [facility] scheduling an appointment.  She also report she has been able to speak with [facility] today, follow up appointment scheduled for 3/31.  Denies she has received follow up call from PCP office regarding Plavix prescription.  [facility] they confirm they have not received prescription.  This RNCM placed call to Neurology office, they are unable to send prescription as member has not established in the office as a patient as of yet.  Call placed to PCP office to request prescription be sent, message will be forwarded to PCP for further instructions.  Message sent to Dr. Derrill Kay (discharging provider) to request prescription, awaiting response.  Will follow up with provider and member's sister within the next 2 business days.  Valente David, RN, MSN, [place] Manager [phone]

## 2022-02-01 ENCOUNTER — Inpatient Hospital Stay (HOSPITAL_COMMUNITY): Payer: BC Managed Care – PPO

## 2022-02-01 ENCOUNTER — Inpatient Hospital Stay (HOSPITAL_COMMUNITY)
Admission: EM | Admit: 2022-02-01 | Discharge: 2022-02-09 | DRG: 377 | Disposition: A | Discharge status: Skilled Nursing Facility | Payer: BC Managed Care – PPO | Attending: Internal Medicine | Admitting: Internal Medicine

## 2022-02-01 ENCOUNTER — Encounter (HOSPITAL_COMMUNITY): Payer: Self-pay | Admitting: Internal Medicine

## 2022-02-01 DIAGNOSIS — K5730 Diverticulosis of large intestine without perforation or abscess without bleeding: Secondary | ICD-10-CM | POA: Diagnosis present

## 2022-02-01 DIAGNOSIS — J3081 Allergic rhinitis due to animal (cat) (dog) hair and dander: Secondary | ICD-10-CM | POA: Diagnosis not present

## 2022-02-01 DIAGNOSIS — K269 Duodenal ulcer, unspecified as acute or chronic, without hemorrhage or perforation: Secondary | ICD-10-CM | POA: Diagnosis present

## 2022-02-01 DIAGNOSIS — R1312 Dysphagia, oropharyngeal phase: Secondary | ICD-10-CM | POA: Diagnosis not present

## 2022-02-01 DIAGNOSIS — Z86010 Personal history of colonic polyps: Secondary | ICD-10-CM

## 2022-02-01 DIAGNOSIS — Z66 Do not resuscitate: Secondary | ICD-10-CM | POA: Diagnosis not present

## 2022-02-01 DIAGNOSIS — R32 Unspecified urinary incontinence: Secondary | ICD-10-CM | POA: Diagnosis not present

## 2022-02-01 DIAGNOSIS — F17220 Nicotine dependence, chewing tobacco, uncomplicated: Secondary | ICD-10-CM | POA: Diagnosis present

## 2022-02-01 DIAGNOSIS — Z8673 Personal history of transient ischemic attack (TIA), and cerebral infarction without residual deficits: Secondary | ICD-10-CM

## 2022-02-01 DIAGNOSIS — Z7984 Long term (current) use of oral hypoglycemic drugs: Secondary | ICD-10-CM

## 2022-02-01 DIAGNOSIS — R279 Unspecified lack of coordination: Secondary | ICD-10-CM | POA: Diagnosis not present

## 2022-02-01 DIAGNOSIS — R41841 Cognitive communication deficit: Secondary | ICD-10-CM | POA: Diagnosis not present

## 2022-02-01 DIAGNOSIS — Z79899 Other long term (current) drug therapy: Secondary | ICD-10-CM

## 2022-02-01 DIAGNOSIS — I639 Cerebral infarction, unspecified: Secondary | ICD-10-CM | POA: Diagnosis present

## 2022-02-01 DIAGNOSIS — K862 Cyst of pancreas: Secondary | ICD-10-CM | POA: Diagnosis present

## 2022-02-01 DIAGNOSIS — E43 Unspecified severe protein-calorie malnutrition: Secondary | ICD-10-CM | POA: Diagnosis not present

## 2022-02-01 DIAGNOSIS — N390 Urinary tract infection, site not specified: Secondary | ICD-10-CM | POA: Diagnosis present

## 2022-02-01 DIAGNOSIS — K2990 Gastroduodenitis, unspecified, without bleeding: Secondary | ICD-10-CM | POA: Diagnosis not present

## 2022-02-01 DIAGNOSIS — R195 Other fecal abnormalities: Secondary | ICD-10-CM | POA: Diagnosis present

## 2022-02-01 DIAGNOSIS — R630 Anorexia: Secondary | ICD-10-CM | POA: Diagnosis not present

## 2022-02-01 DIAGNOSIS — C439 Malignant melanoma of skin, unspecified: Secondary | ICD-10-CM | POA: Diagnosis not present

## 2022-02-01 DIAGNOSIS — E785 Hyperlipidemia, unspecified: Secondary | ICD-10-CM | POA: Diagnosis present

## 2022-02-01 DIAGNOSIS — R634 Abnormal weight loss: Secondary | ICD-10-CM | POA: Diagnosis not present

## 2022-02-01 DIAGNOSIS — I700 Atherosclerosis of aorta: Secondary | ICD-10-CM | POA: Diagnosis not present

## 2022-02-01 DIAGNOSIS — I10 Essential (primary) hypertension: Secondary | ICD-10-CM | POA: Diagnosis present

## 2022-02-01 DIAGNOSIS — R710 Precipitous drop in hematocrit: Secondary | ICD-10-CM | POA: Diagnosis not present

## 2022-02-01 DIAGNOSIS — K648 Other hemorrhoids: Secondary | ICD-10-CM | POA: Diagnosis not present

## 2022-02-01 DIAGNOSIS — Z20822 Contact with and (suspected) exposure to covid-19: Secondary | ICD-10-CM | POA: Diagnosis not present

## 2022-02-01 DIAGNOSIS — R2689 Other abnormalities of gait and mobility: Secondary | ICD-10-CM | POA: Diagnosis not present

## 2022-02-01 DIAGNOSIS — Z85828 Personal history of other malignant neoplasm of skin: Secondary | ICD-10-CM

## 2022-02-01 DIAGNOSIS — Z7401 Bed confinement status: Secondary | ICD-10-CM | POA: Diagnosis not present

## 2022-02-01 DIAGNOSIS — Z9221 Personal history of antineoplastic chemotherapy: Secondary | ICD-10-CM

## 2022-02-01 DIAGNOSIS — C801 Malignant (primary) neoplasm, unspecified: Secondary | ICD-10-CM | POA: Insufficient documentation

## 2022-02-01 DIAGNOSIS — E1142 Type 2 diabetes mellitus with diabetic polyneuropathy: Secondary | ICD-10-CM | POA: Diagnosis not present

## 2022-02-01 DIAGNOSIS — Z923 Personal history of irradiation: Secondary | ICD-10-CM

## 2022-02-01 DIAGNOSIS — R627 Adult failure to thrive: Secondary | ICD-10-CM | POA: Diagnosis present

## 2022-02-01 DIAGNOSIS — K2289 Other specified disease of esophagus: Secondary | ICD-10-CM | POA: Diagnosis not present

## 2022-02-01 DIAGNOSIS — K2970 Gastritis, unspecified, without bleeding: Secondary | ICD-10-CM

## 2022-02-01 DIAGNOSIS — K449 Diaphragmatic hernia without obstruction or gangrene: Secondary | ICD-10-CM | POA: Diagnosis present

## 2022-02-01 DIAGNOSIS — K644 Residual hemorrhoidal skin tags: Secondary | ICD-10-CM | POA: Diagnosis present

## 2022-02-01 DIAGNOSIS — E119 Type 2 diabetes mellitus without complications: Secondary | ICD-10-CM

## 2022-02-01 DIAGNOSIS — Z7982 Long term (current) use of aspirin: Secondary | ICD-10-CM

## 2022-02-01 DIAGNOSIS — K861 Other chronic pancreatitis: Secondary | ICD-10-CM | POA: Diagnosis present

## 2022-02-01 DIAGNOSIS — R262 Difficulty in walking, not elsewhere classified: Secondary | ICD-10-CM | POA: Diagnosis not present

## 2022-02-01 DIAGNOSIS — D649 Anemia, unspecified: Secondary | ICD-10-CM | POA: Diagnosis present

## 2022-02-01 DIAGNOSIS — M6281 Muscle weakness (generalized): Secondary | ICD-10-CM | POA: Diagnosis not present

## 2022-02-01 DIAGNOSIS — K922 Gastrointestinal hemorrhage, unspecified: Principal | ICD-10-CM | POA: Diagnosis present

## 2022-02-01 DIAGNOSIS — C7800 Secondary malignant neoplasm of unspecified lung: Secondary | ICD-10-CM | POA: Diagnosis present

## 2022-02-01 DIAGNOSIS — L8990 Pressure ulcer of unspecified site, unspecified stage: Secondary | ICD-10-CM | POA: Insufficient documentation

## 2022-02-01 DIAGNOSIS — L89152 Pressure ulcer of sacral region, stage 2: Secondary | ICD-10-CM | POA: Diagnosis not present

## 2022-02-01 DIAGNOSIS — R972 Elevated prostate specific antigen [PSA]: Secondary | ICD-10-CM | POA: Diagnosis not present

## 2022-02-01 DIAGNOSIS — Z6820 Body mass index (BMI) 20.0-20.9, adult: Secondary | ICD-10-CM

## 2022-02-01 DIAGNOSIS — Z85841 Personal history of malignant neoplasm of brain: Secondary | ICD-10-CM

## 2022-02-01 DIAGNOSIS — I2510 Atherosclerotic heart disease of native coronary artery without angina pectoris: Secondary | ICD-10-CM | POA: Diagnosis present

## 2022-02-01 DIAGNOSIS — R531 Weakness: Secondary | ICD-10-CM | POA: Diagnosis not present

## 2022-02-01 HISTORY — DX: Alcohol dependence, uncomplicated: F10.20

## 2022-02-01 HISTORY — DX: Other chronic pancreatitis: K86.1

## 2022-02-01 HISTORY — DX: Cyst of pancreas: K86.2

## 2022-02-01 HISTORY — DX: Polyneuropathy, unspecified: G62.9

## 2022-02-01 LAB — CBC WITH DIFFERENTIAL/PLATELET
Abs Immature Granulocytes: 0.06 10*3/uL (ref 0.00–0.07)
Basophils Absolute: 0.1 10*3/uL (ref 0.0–0.1)
Basophils Relative: 1 %
Eosinophils Absolute: 0.1 10*3/uL (ref 0.0–0.5)
Eosinophils Relative: 1 %
HCT: 32.3 % — ABNORMAL LOW (ref 39.0–52.0)
Hemoglobin: 11.5 g/dL — ABNORMAL LOW (ref 13.0–17.0)
Immature Granulocytes: 1 %
Lymphocytes Relative: 11 %
Lymphs Abs: 0.9 10*3/uL (ref 0.7–4.0)
MCH: 32 pg (ref 26.0–34.0)
MCHC: 35.6 g/dL (ref 30.0–36.0)
MCV: 90 fL (ref 80.0–100.0)
Monocytes Absolute: 0.7 10*3/uL (ref 0.1–1.0)
Monocytes Relative: 8 %
Neutro Abs: 6.8 10*3/uL (ref 1.7–7.7)
Neutrophils Relative %: 78 %
Platelets: 353 10*3/uL (ref 150–400)
RBC: 3.59 MIL/uL — ABNORMAL LOW (ref 4.22–5.81)
RDW: 11.9 % (ref 11.5–15.5)
WBC: 8.6 10*3/uL (ref 4.0–10.5)
nRBC: 0 % (ref 0.0–0.2)

## 2022-02-01 LAB — COMPREHENSIVE METABOLIC PANEL
ALT: 11 U/L (ref 0–44)
AST: 12 U/L — ABNORMAL LOW (ref 15–41)
Albumin: 3.2 g/dL — ABNORMAL LOW (ref 3.5–5.0)
Alkaline Phosphatase: 34 U/L — ABNORMAL LOW (ref 38–126)
Anion gap: 11 (ref 5–15)
BUN: 45 mg/dL — ABNORMAL HIGH (ref 6–20)
CO2: 27 mmol/L (ref 22–32)
Calcium: 9.2 mg/dL (ref 8.9–10.3)
Chloride: 97 mmol/L — ABNORMAL LOW (ref 98–111)
Creatinine, Ser: 1.27 mg/dL — ABNORMAL HIGH (ref 0.61–1.24)
GFR, Estimated: >60 mL/min (ref >60)
Glucose, Bld: 148 mg/dL — ABNORMAL HIGH (ref 70–99)
Potassium: 3.6 mmol/L (ref 3.5–5.1)
Sodium: 135 mmol/L (ref 135–145)
Total Bilirubin: 0.7 mg/dL (ref 0.3–1.2)
Total Protein: 5.8 g/dL — ABNORMAL LOW (ref 6.5–8.1)

## 2022-02-01 LAB — URINALYSIS, ROUTINE W REFLEX MICROSCOPIC
Bilirubin Urine: NEGATIVE
Glucose, UA: NEGATIVE mg/dL
Ketones, ur: 5 mg/dL — AB
Nitrite: POSITIVE — AB
Protein, ur: NEGATIVE mg/dL
Specific Gravity, Urine: 1.016 (ref 1.005–1.030)
WBC, UA: >50 WBC/hpf — ABNORMAL HIGH (ref 0–5)
pH: 5 (ref 5.0–8.0)

## 2022-02-01 LAB — RESP PANEL BY RT-PCR (FLU A&B, COVID) ARPGX2
Influenza A by PCR: NEGATIVE
Influenza B by PCR: NEGATIVE
SARS Coronavirus 2 by RT PCR: NEGATIVE

## 2022-02-01 LAB — CBG MONITORING, ED
Glucose-Capillary: 139 mg/dL — ABNORMAL HIGH (ref 70–99)
Glucose-Capillary: 98 mg/dL (ref 70–99)

## 2022-02-01 LAB — MAGNESIUM: Magnesium: 1.3 mg/dL — ABNORMAL LOW (ref 1.7–2.4)

## 2022-02-01 LAB — POC OCCULT BLOOD, ED: Fecal Occult Bld: POSITIVE — AB

## 2022-02-01 IMAGING — CT CT CHEST-ABD-PELV W/ CM
3 of 5 series · 15 of 46 positions shown, 16 images · IV contrast (agent unspecified)
Comparison: [DATE]

CLINICAL DATA: Unintended weight loss.  Melanoma

EXAM:
CT CHEST, ABDOMEN, AND PELVIS WITH CONTRAST
TECHNIQUE: Multidetector CT imaging of the chest, abdomen and pelvis was
performed following the standard protocol during bolus
administration of intravenous contrast.

[Series 3: cap with · axial · 0.82mm/px · z∈[+635,+1090]mm · 7 of 123 slices shown, 8 images]
[im 16/123  soft-tissue]
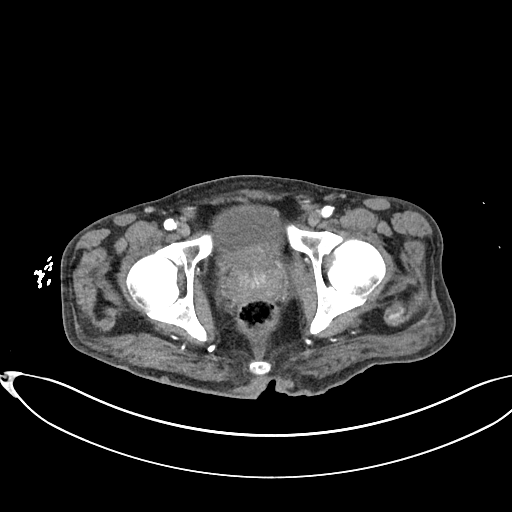
[im 16/123  bone]
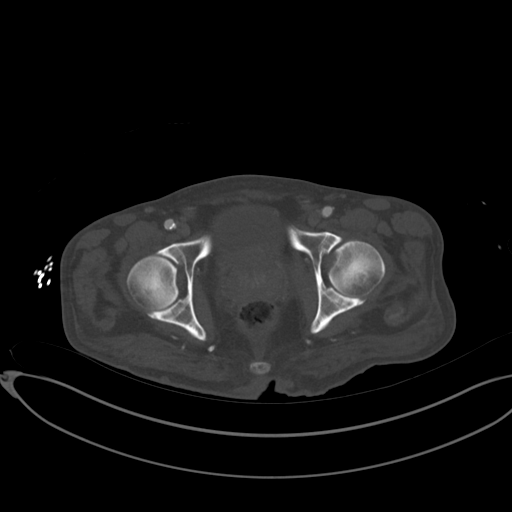
[im 31/123  soft-tissue]
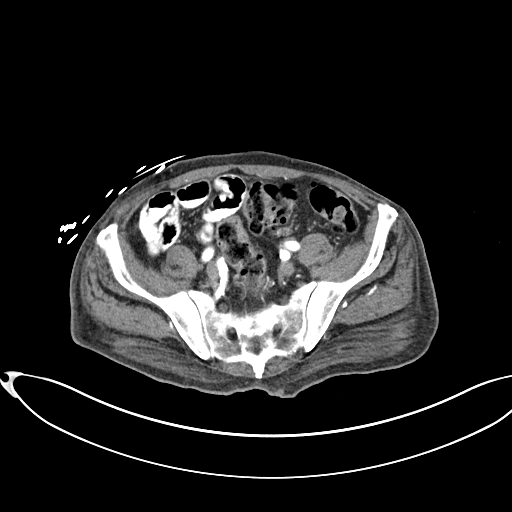
[im 46/123  soft-tissue]
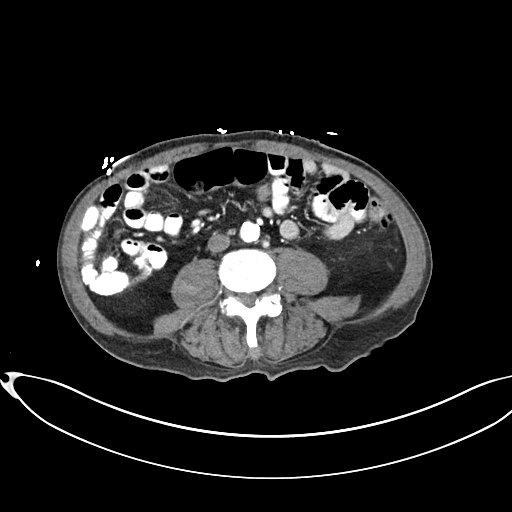
[im 62/123  soft-tissue]
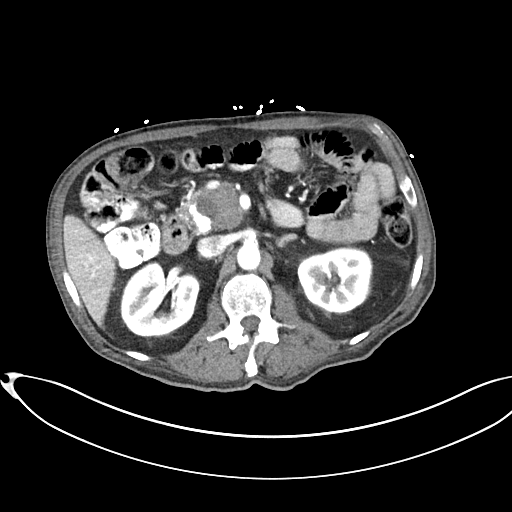
[im 77/123  soft-tissue]
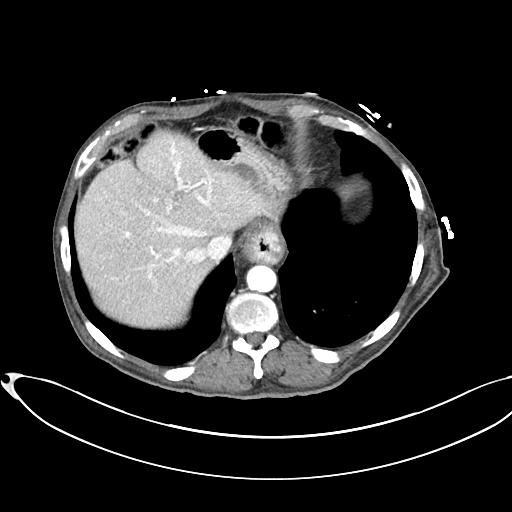
[im 92/123  soft-tissue]
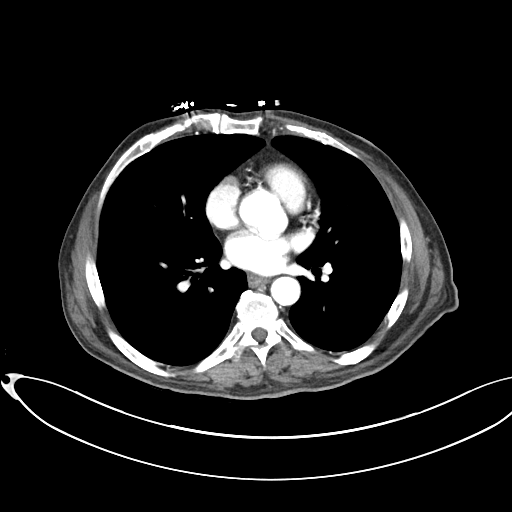
[im 107/123  soft-tissue]
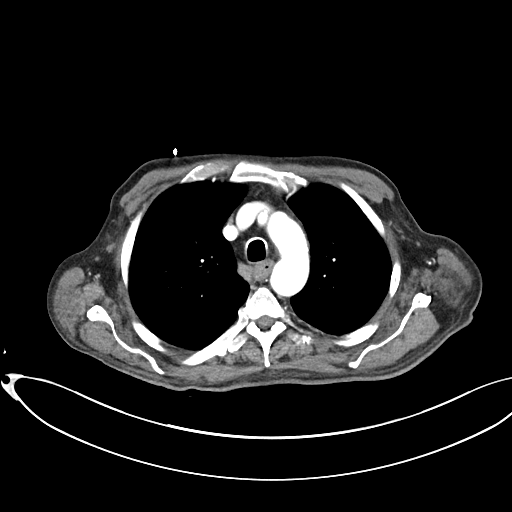

[Series 4: lungs · axial · 0.82mm/px · z∈[+620,+894]mm · 5 of 306 slices shown]
[im 31/306  soft-tissue]
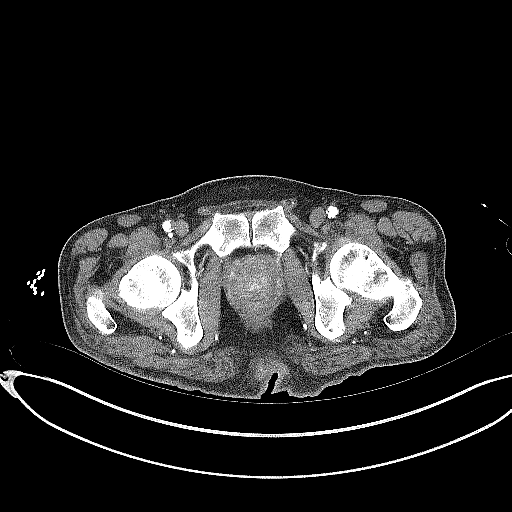
[im 62/306  soft-tissue]
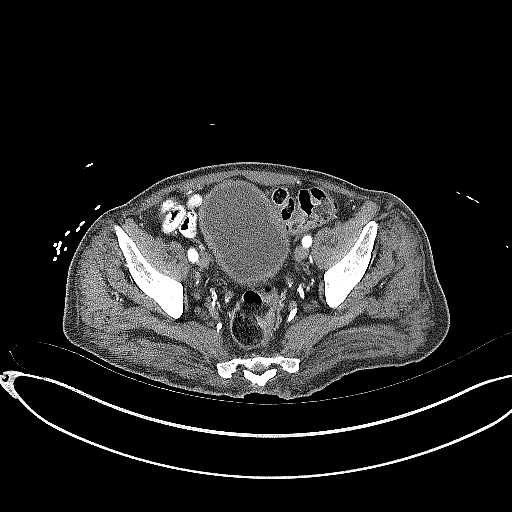
[im 92/306  soft-tissue]
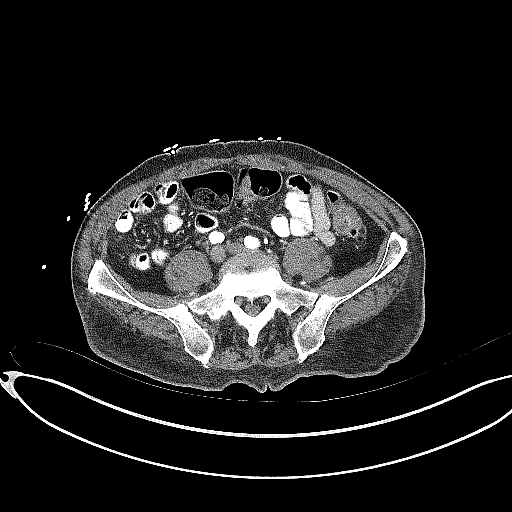
[im 138/306  soft-tissue]
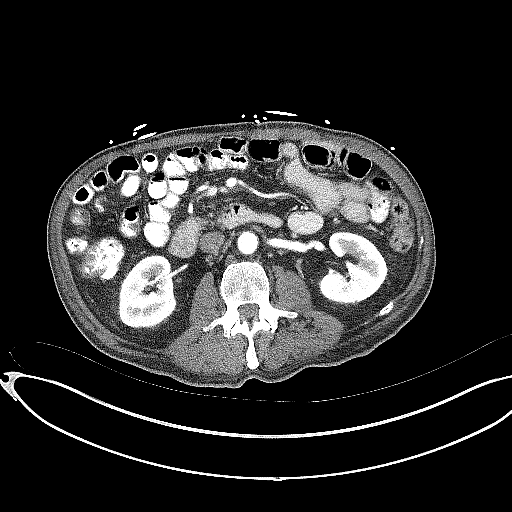
[im 168/306  soft-tissue]
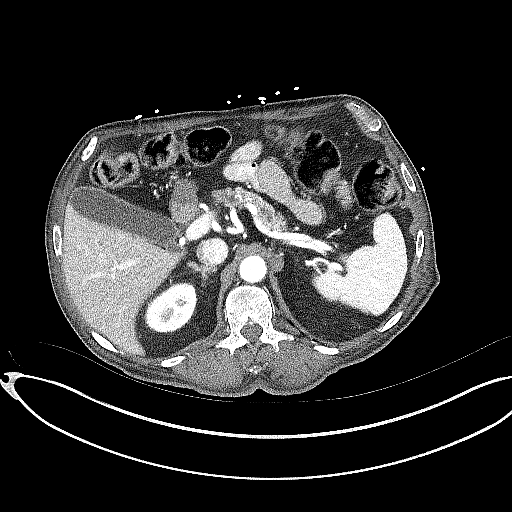

[Series 6: cor · coronal · 0.74mm/px · 3 of 89 slices shown]
[im 30/89  soft-tissue]
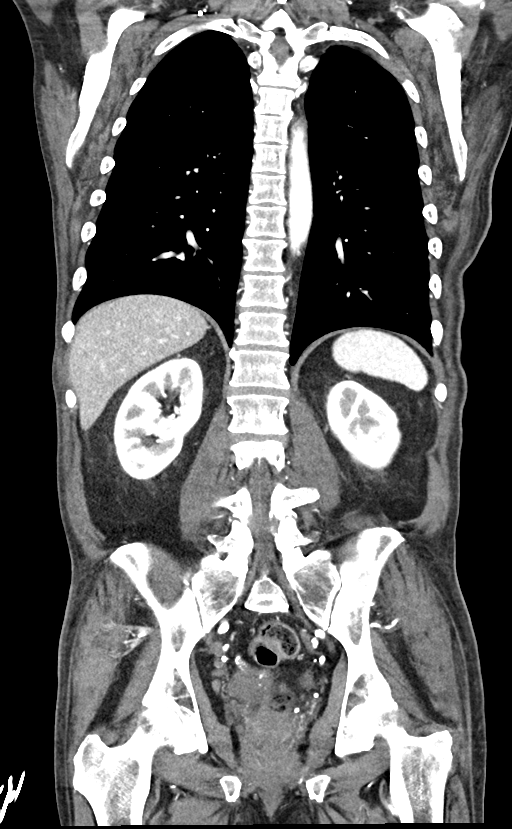
[im 40/89  soft-tissue]
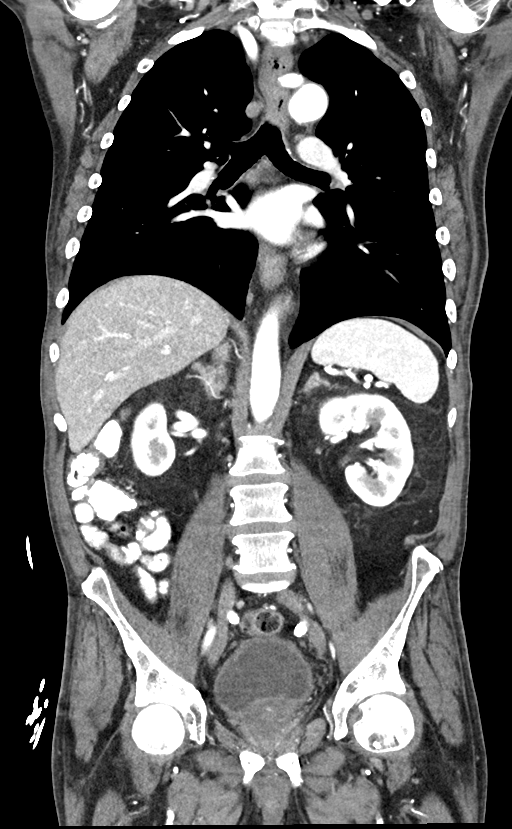
[im 49/89  soft-tissue]
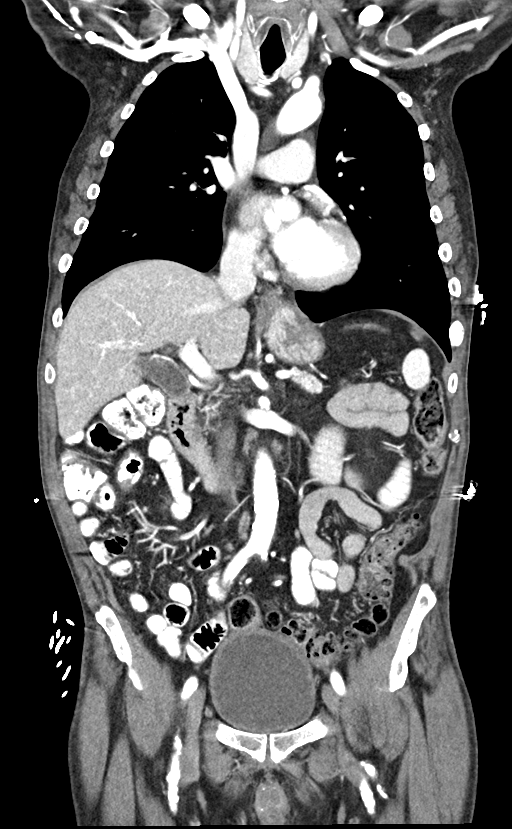

[15 of 46 positions shown; findings below may reference images not displayed]

RADIATION DOSE REDUCTION: This exam was performed according to the
departmental dose-optimization program which includes automated
exposure control, adjustment of the mA and/or kV according to
patient size and/or use of iterative reconstruction technique.

CONTRAST:  100mL OMNIPAQUE IOHEXOL 350 MG/ML SOLN
FINDINGS: CT CHEST FINDINGS

Cardiovascular: Heart is normal size. Scattered coronary artery and
aortic calcifications. No aneurysm. Retroesophageal right subclavian
artery noted.

Mediastinum/Nodes: No mediastinal, hilar, or axillary adenopathy.
Trachea and esophagus are unremarkable. Thyroid unremarkable. Small
hiatal hernia.

Lungs/Pleura: Lungs are clear. No focal airspace opacities or
suspicious nodules. No effusions.

Musculoskeletal: Chest wall soft tissues are unremarkable. No acute
bony abnormality. Old healed fractures bilaterally.

CT ABDOMEN PELVIS FINDINGS

Hepatobiliary: No focal hepatic abnormality. Gallbladder
unremarkable.

Pancreas: No focal hepatic abnormality. Gallbladder unremarkable.
Calcifications in the pancreatic head and body compatible with
chronic pancreatitis. Mixed density predominantly low-density mass
noted in the pancreatic head/uncinate process measuring 3.6 x
cm. There is pancreatic ductal dilatation. Pancreatic duct measures
up to 7 mm in the pancreatic head/body.

Spleen: No focal abnormality.  Normal size.

Adrenals/Urinary Tract: No adrenal abnormality. No focal renal
abnormality. No stones or hydronephrosis. Urinary bladder is
unremarkable.

Stomach/Bowel: Stomach, large and small bowel grossly unremarkable.

Vascular/Lymphatic: Aortic atherosclerosis. No evidence of aneurysm
or adenopathy.

Reproductive: Mildly prominent prostate

Other: No free fluid or free air.

Musculoskeletal: No acute bony abnormality.
IMPRESSION: No acute cardiopulmonary disease.

Coronary artery disease, aortic atherosclerosis.

Small hiatal hernia.

Changes of chronic pancreatitis.

New cystic mass within the pancreatic head/uncinate process
measuring up to 3.6 cm. Pancreatic ductal dilatation noted. Findings
concerning for possible cystic pancreatic neoplasm. When the patient
is clinically stable and able to follow directions and hold their
breath (preferably as an outpatient) further evaluation with
dedicated abdominal MRI should be considered.

## 2022-02-01 MED ORDER — ACETAMINOPHEN 325 MG PO TABS
650.0000 mg | ORAL_TABLET | Freq: Four times a day (QID) | ORAL | Status: DC | PRN
  Filled 2022-02-01: qty 2

## 2022-02-01 MED ORDER — AMLODIPINE BESYLATE 10 MG PO TABS
10.0000 mg | ORAL_TABLET | Freq: Every day | ORAL | Status: DC
  Administered 2022-02-02 – 2022-02-09 (×8): 10 mg via ORAL
  Filled 2022-02-01 (×5): qty 1
  Filled 2022-02-01: qty 2
  Filled 2022-02-01 (×2): qty 1

## 2022-02-01 MED ORDER — MORPHINE SULFATE (PF) 2 MG/ML IV SOLN: 2.0000 mg | INTRAVENOUS | Status: DC | PRN

## 2022-02-01 MED ORDER — SODIUM CHLORIDE 0.9 % IV BOLUS
1000.0000 mL | Freq: Once | INTRAVENOUS | Status: AC
  Administered 2022-02-01: 1000 mL via INTRAVENOUS

## 2022-02-01 MED ORDER — PANTOPRAZOLE SODIUM 40 MG IV SOLR
40.0000 mg | Freq: Once | INTRAVENOUS | Status: AC
  Administered 2022-02-01: 40 mg via INTRAVENOUS
  Filled 2022-02-01: qty 10

## 2022-02-01 MED ORDER — LACTATED RINGERS IV SOLN: INTRAVENOUS | Status: DC

## 2022-02-01 MED ORDER — IOHEXOL 350 MG/ML SOLN
100.0000 mL | Freq: Once | INTRAVENOUS | Status: AC | PRN
Start: 2022-02-01 — End: 2022-02-01
  Administered 2022-02-01: 100 mL via INTRAVENOUS

## 2022-02-01 MED ORDER — HYDRALAZINE HCL 20 MG/ML IJ SOLN: 5.0000 mg | INTRAMUSCULAR | Status: DC | PRN

## 2022-02-01 MED ORDER — MAGNESIUM SULFATE 2 GM/50ML IV SOLN
2.0000 g | Freq: Once | INTRAVENOUS | Status: AC
  Administered 2022-02-01: 2 g via INTRAVENOUS
  Filled 2022-02-01: qty 50

## 2022-02-01 MED ORDER — ATORVASTATIN CALCIUM 10 MG PO TABS
20.0000 mg | ORAL_TABLET | Freq: Every day | ORAL | Status: DC
  Administered 2022-02-02 – 2022-02-09 (×8): 20 mg via ORAL
  Filled 2022-02-01 (×8): qty 2

## 2022-02-01 MED ORDER — METOPROLOL TARTRATE 100 MG PO TABS
100.0000 mg | ORAL_TABLET | Freq: Two times a day (BID) | ORAL | Status: DC
  Administered 2022-02-01 – 2022-02-09 (×17): 100 mg via ORAL
  Filled 2022-02-01 (×5): qty 1
  Filled 2022-02-01 (×2): qty 4
  Filled 2022-02-01 (×10): qty 1

## 2022-02-01 MED ORDER — ACETAMINOPHEN 650 MG RE SUPP: 650.0000 mg | Freq: Four times a day (QID) | RECTAL | Status: DC | PRN

## 2022-02-01 MED ORDER — PANTOPRAZOLE SODIUM 40 MG PO TBEC
40.0000 mg | DELAYED_RELEASE_TABLET | Freq: Two times a day (BID) | ORAL | Status: DC
  Administered 2022-02-01 – 2022-02-06 (×10): 40 mg via ORAL
  Filled 2022-02-01 (×10): qty 1

## 2022-02-01 MED ORDER — ONDANSETRON HCL 4 MG/2ML IJ SOLN: 4.0000 mg | Freq: Four times a day (QID) | INTRAMUSCULAR | Status: DC | PRN

## 2022-02-01 MED ORDER — INSULIN ASPART 100 UNIT/ML IJ SOLN
0.0000 [IU] | Freq: Every day | INTRAMUSCULAR | Status: DC
  Administered 2022-02-03: 4 [IU] via SUBCUTANEOUS
  Administered 2022-02-05: 2 [IU] via SUBCUTANEOUS
  Administered 2022-02-06 – 2022-02-07 (×2): 3 [IU] via SUBCUTANEOUS
  Administered 2022-02-08 – 2022-02-09 (×2): 2 [IU] via SUBCUTANEOUS

## 2022-02-01 MED ORDER — SODIUM CHLORIDE 0.9% FLUSH
3.0000 mL | Freq: Two times a day (BID) | INTRAVENOUS | Status: DC
  Administered 2022-02-02 – 2022-02-09 (×13): 3 mL via INTRAVENOUS

## 2022-02-01 MED ORDER — INSULIN ASPART 100 UNIT/ML IJ SOLN
0.0000 [IU] | Freq: Three times a day (TID) | INTRAMUSCULAR | Status: DC
  Administered 2022-02-02: 2 [IU] via SUBCUTANEOUS
  Administered 2022-02-04 (×2): 1 [IU] via SUBCUTANEOUS
  Administered 2022-02-04: 3 [IU] via SUBCUTANEOUS
  Administered 2022-02-05: 13:00:00 9 [IU] via SUBCUTANEOUS
  Administered 2022-02-05: 18:00:00 3 [IU] via SUBCUTANEOUS
  Administered 2022-02-05: 1 [IU] via SUBCUTANEOUS
  Administered 2022-02-06: 5 [IU] via SUBCUTANEOUS
  Administered 2022-02-06: 2 [IU] via SUBCUTANEOUS
  Administered 2022-02-06 – 2022-02-07 (×2): 5 [IU] via SUBCUTANEOUS
  Administered 2022-02-07 (×2): 3 [IU] via SUBCUTANEOUS
  Administered 2022-02-08: 2 [IU] via SUBCUTANEOUS
  Administered 2022-02-08: 5 [IU] via SUBCUTANEOUS
  Administered 2022-02-08: 1 [IU] via SUBCUTANEOUS
  Administered 2022-02-09: 12:00:00 3 [IU] via SUBCUTANEOUS
  Administered 2022-02-09: 09:00:00 1 [IU] via SUBCUTANEOUS
  Administered 2022-02-09: 18:00:00 2 [IU] via SUBCUTANEOUS

## 2022-02-01 MED ORDER — ASPIRIN EC 81 MG PO TBEC
81.0000 mg | DELAYED_RELEASE_TABLET | Freq: Every day | ORAL | Status: DC
  Administered 2022-02-02 – 2022-02-09 (×8): 81 mg via ORAL
  Filled 2022-02-01 (×8): qty 1

## 2022-02-01 MED ORDER — ONDANSETRON HCL 4 MG PO TABS: 4.0000 mg | ORAL_TABLET | Freq: Four times a day (QID) | ORAL | Status: DC | PRN

## 2022-02-01 NOTE — ED Provider Notes (Signed)
?Physical Exam  ?BP (!) 144/80   Pulse (!) 50   Temp 98.3 ?F (36.8 ?C) (Oral)   Resp 16   SpO2 100%  ? ?Physical Exam ?Vitals and nursing note reviewed.  ?Constitutional:   ?   General: He is not in acute distress. ?   Appearance: He is well-developed. He is not diaphoretic.  ?HENT:  ?   Head: Normocephalic and atraumatic.  ?Eyes:  ?   General: No scleral icterus. ?   Conjunctiva/sclera: Conjunctivae normal.  ?Pulmonary:  ?   Effort: Pulmonary effort is normal. No respiratory distress.  ?Musculoskeletal:  ?   Cervical back: Normal range of motion.  ?Skin: ?   Findings: No rash.  ?Neurological:  ?   Mental Status: He is alert.  ? ? ?Procedures  ?Procedures ? ?ED Course / MDM  ? ?Clinical Course as of 02/01/22 1620  ?Wed Feb 01, 2022  ?1246 Magnesium(!): 1.3 ?Will replete [SB]  ?1247 Hemoglobin(!): 11.5 ?Will check hemoccult card in ED. Pt denies blood in stool, coughing up blood, or blood in vomit. [SB]  ?1426 Discussed with patient nurse to ambulate patient. [SB]  ?1434 Fecal Occult Blood, POC(!): POSITIVE ?Will give protonix [SB]  ?Rolfe with [facility] PA who will see the patient at the bedside. [HK]  ?  ?Clinical Course User Index ?[HK] Osher Oettinger, PA-C ?[SB] Blue, Soijett A, PA-C  ? ?Medical Decision Making ?Amount and/or Complexity of Data Reviewed ?Labs:  Decision-making details documented in ED Course. ? ?Risk ?Prescription drug management. ?Decision regarding hospitalization. ? ? ?Care of patient assumed from PA blue at 3:30 PM.  Agree with history, physical exam and plan.  See their note for further details.  Briefly, 61 y.o. male with PMH/PSH as below who presents with generalized weakness, decreased appetite.  Patient was discharged from the hospital on 01/26/2022 for acute CVA, hyponatremia and acute renal failure.  He continues to have decreased appetite.  His urinary incontinence is chronic. ?Found to be Hemoccult positive.  He is low on magnesium which was repleted IV.  His hemoglobin had  a one-point drop in the past week and is now 11.5.  He had 1 brief episode of hypotension with systolic blood pressures in the 90s. ? ?Past Medical History:  ?Diagnosis Date  ? Cancer [facility])   ? metastatic melanoma- spread to lungs, two "spots" on brain per xray- last chemo tx in 2014, September- "all clear", no tx now  ? Diabetes mellitus without complication ([place])   ? Hyperlipidemia   ? Hypertension   ? ?Past Surgical History:  ?Procedure Laterality Date  ? BRAIN SURGERY  2015  ? to check for possible malignancy from melanoma, result were scar tissue  ? melanoma exicison    ? right leg  ?  ? ? ?Current Plan: ?Consult GI and hospitalist for admission ? ?*Of note patient was prescribed Plavix at his prior hospitalization but was never able to pick it up therefore he is not on Plavix. ? ?MDM/ED Course: ?GI to see in consult. Patient updated on plan and given Protonix.  He remains hemodynamically stable with blood pressures in the 220 systolic. ? ?Patient admitted to hospitalist service. ? ?Consults: ?GI- [place] ?Hospitalist Dr. Lorin Mercy ? ? ?Significant labs/images: ?Labs Reviewed  ?CBC WITH DIFFERENTIAL/PLATELET - Abnormal; Notable for the following components:  ?    Result Value  ? RBC 3.59 (*)   ? Hemoglobin 11.5 (*)   ? HCT 32.3 (*)   ? All other components  within normal limits  ?COMPREHENSIVE METABOLIC PANEL - Abnormal; Notable for the following components:  ? Chloride 97 (*)   ? Glucose, Bld 148 (*)   ? BUN 45 (*)   ? Creatinine, Ser 1.27 (*)   ? Total Protein 5.8 (*)   ? Albumin 3.2 (*)   ? AST 12 (*)   ? Alkaline Phosphatase 34 (*)   ? All other components within normal limits  ?MAGNESIUM - Abnormal; Notable for the following components:  ? Magnesium 1.3 (*)   ? All other components within normal limits  ?CBG MONITORING, ED - Abnormal; Notable for the following components:  ? Glucose-Capillary 139 (*)   ? All other components within normal limits  ?POC OCCULT BLOOD, ED - Abnormal; Notable for the following  components:  ? Fecal Occult Bld POSITIVE (*)   ? All other components within normal limits  ?URINALYSIS, ROUTINE W REFLEX MICROSCOPIC  ? ? ?I personally reviewed and interpreted all labs. ? ?The plan for this patient was discussed with Dr. Darl Householder, who voiced agreement and who oversaw evaluation and treatment of this patient. ? ? ? ?Portions of this note were generated with Lobby[employer]. Dictation errors may occur despite best attempts at proofreading. ? ? ? ?  Delia Heady, PA-C ?02/01/22 1620 ? ?  ?Drenda Freeze, MD ?02/02/22 1506 ? ?

## 2022-02-01 NOTE — ED Notes (Signed)
Pt brought in by sister d/t concerns w/ incontinence, nutrition, and mobility.  Sister reports "no one has called [place] or spoken to [place] since he was discharged."  Pt reports he doesn't eat much because he "isn't hungry." ?

## 2022-02-01 NOTE — H&P (Signed)
History and Physical    Patient: Kenneth [name] PIR:[id number] DOB: 1961-11-20 DOA: 02/01/2022 DOS: the patient was seen and examined on 02/01/2022 PCP: Isaac Bliss, Rayford Halsted, MD  Patient coming from: Home - lives alone, staying with sister, her husband, and their mother; NOK: Reynaldo Minium, [phone]   Chief Complaint: Weakness  HPI: Kenneth [name] is a 61 y.o. male with medical history significant of HTN; HLD; DM; melanoma metastatic to lung and brain; ETOH dependence; polyneuropathy; and recent admission (2/21-23) for CVA who was discharged with outpatient PT.  He presents today with weakness.  Since last hospitalization, he has not been eating.  He is very weak.  He was "nowhere near ambulatory".  He has been incontinent.  It is hard to know what is new vs. Chronic.  He is dizzy with standing.  His sister is unable to care for him at home - she is having too much difficulty assisting him.  He has lost over 60 pounds in about 2 months.  He hasn't been eating because he just isn't hungry. He had been drinking, none since the day before he came to the hospital.  He had not been taking chronic medications for years prior to last hospitalization.  He is confused at times.  He has not noticed blood in his stools.  He is continent of urine and possibly also stools.  +constipation.  +orthostasis.  No abdominal pain.  He had melanoma on his leg and it was removed but it spread to lungs and brain in 2013.  He was in remission for 7 years at last [facility] follow up (prior to [place]).  His last appointment at [facility] was on 10/21/19 and he was due to return in 1 year.  He was treated with cyberknife radiosurgery to solitary L frontotemporal lobe met in 2013 and received ipilimumab x 4 and stereotactic brain radiation to follow.  MRI was negative for mets on 2/22.    ER Course:  FTT since d/c.  Hgb 1 point lower, heme positive.  GI will consult.  Supposed to be on Plavix but he never picked up.        Review of Systems: As mentioned in the history of present illness. All other systems reviewed and are negative. Past Medical History:  Diagnosis Date   Alcohol dependence ([place])    Cancer ([place])    metastatic melanoma- spread to lungs, two "spots" on brain per xray- last chemo tx in 2014, September- "all clear", no tx now   CVA (cerebral vascular accident) ([place]) 01/24/2022   Diabetes mellitus without complication ([place])    Hyperlipidemia    Hypertension    Polyneuropathy    Past Surgical History:  Procedure Laterality Date   BRAIN SURGERY  2015   to check for possible malignancy from melanoma, result were scar tissue   melanoma exicison     right leg   Social History:  reports that he has never smoked. His smokeless tobacco use includes snuff. He reports that he does not currently use alcohol. He reports that he does not use drugs.  Allergies  Allergen Reactions   Cat Hair Extract Shortness Of Breath and Swelling    Swelling, watery eyes    Family History  Problem Relation Age of Onset   Prostate cancer Father    Stomach cancer Paternal Grandmother    Colon cancer Neg Hx    Esophageal cancer Neg Hx    Rectal cancer Neg Hx     Prior  to Admission medications   Medication Sig Start Date End Date Taking? Authorizing Provider  aspirin EC 81 MG EC tablet Take 1 tablet (81 mg total) by mouth daily. Swallow whole. 01/27/22   Phillips Grout, MD  atorvastatin (LIPITOR) 20 MG tablet Take 1 tablet (20 mg total) by mouth daily. 01/26/22   Phillips Grout, MD  clopidogrel (PLAVIX) 75 MG tablet Take 1 tablet (75 mg total) by mouth daily. 01/26/22 01/26/23  Phillips Grout, MD  lisinopril-hydrochlorothiazide (ZESTORETIC) 20-12.5 MG tablet Take 2 tablets by mouth daily. 11/11/21   Isaac Bliss, Rayford Halsted, MD  metFORMIN (GLUCOPHAGE) 1000 MG tablet Take 1 tablet (1,000 mg total) by mouth 2 (two) times daily with a meal. 01/26/22   Phillips Grout, MD  Multiple Vitamin (MULTIVITAMIN WITH  MINERALS) TABS tablet Take 1 tablet by mouth daily. 01/27/22   Phillips Grout, MD    Physical Exam: Vitals:   02/01/22 1645 02/01/22 1700 02/01/22 1715 02/01/22 1730  BP: 133/78 140/72 131/82 126/71  Pulse: 75 76 75 75  Resp: _0 Temp:      TempSrc:      SpO2: 100% 100% 100% 100%   General:  Appears chronically ill and mildly confused, somewhat disheveled Eyes:  PERRL, EOMI, normal lids, iris ENT:  grossly normal hearing, lips & tongue, mmm Neck:  no LAD, masses or thyromegaly Cardiovascular:  RRR, no m/r/g. No LE edema.  Respiratory:   CTA bilaterally with no wheezes/rales/rhonchi.  Normal respiratory effort. Abdomen:  soft, NT, ND Skin:  no rash or induration seen on limited exam Musculoskeletal:  grossly normal tone BUE/BLE, good ROM, no bony abnormality Psychiatric:  mildly confused mood and affect, speech fluent and mostly appropriate, AOx3 Neurologic:  CN 2-12 grossly intact, moves all extremities in coordinated fashion   Radiological Exams on Admission: Independently reviewed - see discussion in A/P where applicable  No results found.  EKG: Independently reviewed.  NSR with rate 62; no evidence of acute ischemia   Labs on Admission: I have personally reviewed the available labs and imaging studies at the time of the admission.  Pertinent labs:    Glucose 148 BUN 45/Creatinine 1.27/GFR >60 - stable renal function, prior BUN 14 on 2/22 Mag++ 1.3 Albumin 3.2 WBC 8.6 Hgb 11.5; 14.1 on 2/21 -> 13.6 on 2/21 -> 12.5 on 2/22 Heme positive    Assessment and Plan: * Failure to thrive in adult- (present on admission) -Patient with recent admission for CVA -According to PT note, he was ambulatory with minimal assist but refused hallway walking and preferred to nap during hospitalization -According to his sister, he was not at all ambulatory at home and likely needs placement -Will admit with PT/OT/ST/nutrition evaluations  Occult GI bleeding- (present on  admission) -Patient has had downtrending Hgb since 2/21 -He was having regular blood draws and so this could be related -However, he is guaiac positive and there is concern for GI bleeding, particularly with his significant unexplained weight loss -He does describe orthostatic symptoms -The patient is not tachycardic with low-normal blood pressure, suggesting subacute volume loss.  -Will admit to telemetry -GI consulted by ED, will follow up recommendations -LR at 100 mL/hr -Start pantoprazole 45m PO BID -Zofran IV for nausea -Avoid NSAIDs and SQ heparin -Maintain IV access (2 large bore IVs if possible).  CVA (cerebral vascular accident) ([facility]- (present on admission) -Recent CVA -May need placement -Therapy evaluations, as above -Continue ASA -He did not fill his Plavix  rx but is supposed to be on DAPT  Metastatic melanoma to lung [facility])- (present on admission) -Has been in remission for a number of years -With unexplained weight loss and generalized weakness, rescreening CT C/A/P is reasonable -He does not need additional brain imaging at this time -Outpatient oncology f/u is encouraged  DNR (do not resuscitate)- (present on admission) -I have discussed code status with the patient and his sister and  they are in agreement that the patient would not desire resuscitation and would prefer to die a natural death should that situation arise. -He will need a gold out of facility DNR form at the time of discharge  Essential hypertension- (present on admission) -Continue Lopressor, Norvasc -Hold Zestoretic  Dyslipidemia- (present on admission) -Continue Lipitor  Diabetes mellitus without complication ([place]) -Recent A1c was 6.6, indicating good control -hold Glucophage -Cover with sensitive-scale SSI       Advance Care Planning:   Code Status: DNR   Consults: GI; PT/OT/Nutrition/TOC team  DVT Prophylaxis: SCDs  Family Communication: Sister was present throughout  evaluation  Severity of Illness: The appropriate patient status for this patient is INPATIENT. Inpatient status is judged to be reasonable and necessary in order to provide the required intensity of service to ensure the patient's safety. The patient's presenting symptoms, physical exam findings, and initial radiographic and laboratory data in the context of their chronic comorbidities is felt to place them at high risk for further clinical deterioration. Furthermore, it is not anticipated that the patient will be medically stable for discharge from the hospital within 2 midnights of admission.   * I certify that at the point of admission it is my clinical judgment that the patient will require inpatient hospital care spanning beyond 2 midnights from the point of admission due to high intensity of service, high risk for further deterioration and high frequency of surveillance required.*  Author: Karmen Bongo, MD 02/01/2022 5:43 PM  For on call review [email or web address].

## 2022-02-01 NOTE — ED Notes (Signed)
EDP at bedside  

## 2022-02-01 NOTE — Assessment & Plan Note (Addendum)
Patient noted to have downtrending hemoglobin.  Noted to be heme positive on stool examination.  Gastroenterology was consulted.  Patient started on PPI.  No overt bleeding noted. ?Patient underwent EGD and colonoscopy.  Colonoscopy showed diverticulosis in the sigmoid colon along with external and internal hemorrhoids.  No specimens were collected.   ?EGD showed erythema in the esophagus which was biopsied.  Gastritis and duodenal erosions were also noted.   ?Continue PPI daily. ?Hemoglobin has been stable. ?

## 2022-02-01 NOTE — Assessment & Plan Note (Addendum)
Recent HbA1c 6.6.  Continue metformin.  Monitor CBGs. ?

## 2022-02-01 NOTE — ED Notes (Signed)
Incontient.Marland Kitchen of stool and urine ?

## 2022-02-01 NOTE — Consult Note (Addendum)
[facility] Consult: 3:44 PM 02/01/2022  LOS: 0 days    Referring Provider: Dr Lorin Mercy  Primary Care Physician:  Kenneth [name], Kenneth Halsted, MD Primary Gastroenterologist:  Dr. Wilfrid Lund    Reason for Consultation:  anemia.  FOB+   HPI: Kenneth [name] is a 61 y.o. male.  History metastatic melanoma, resection from thigh in 02/2013.  Mets to brain treated w ipilimumab and stereotactic brain radiation.  Lost to follow-up since COVID.  NIDDM, diet controlled.  Alcohol use disorder..    12/2016 colonoscopy, average risk screening study.  Dr. Loletha Carrow removed 3 polyps.  Left sided diverticulosis.  Nonbleeding internal hemorrhoids. Pathology was tubular adenoma without HGD and hyperplastic.  Colonoscopy repeat recommended for January 2025. CTAP in 2020 showed no metastatic disease of chest, abdomen, pelvis.  There were calcified stones in the head of the pancreas close to the ampulla.  No associated biliary duct or pancreatic duct dilatation.  Small hiatal hernia.  Left colon diverticulosis.  Prostamegaly  2/21 -01/26/2022 admission with acute CVA.  Echo: no major valve issues.  Hyponatremia attributed to HCTZ, beer potomania, hypovolemia.  Discharged on multiple meds including Plavix for only 3 weeks, 81 ASA.  However no Plavix prescription was provided so it has not been filled.  Sister describes several months of anorexia without nausea, vomiting, abdominal pain.  Weight loss approximately 60 pounds.  Progressive weakness to the point where with more than a few steps he requires her assistance for mobility.  Previously used a cane for mobility.  Previously would have daily brown stools in the morning but in recent times, duration unclear, he has been having fewer bowel movements but nothing bloody, black or loose.  No  dysphagia.  No use of NSAIDs other than 81 mg aspirin since discharge.  Abstinent of alcohol since that recent admission.  Presented to the ED this morning.  Brought by sister due to concerns of immobility, poor nutrition, urinary incontinence (for at least 2 years).  Poor p.o. intake, anorexia.  He has been taking most of the prescribed medications but sister was not able to get the Plavix filled.  Last alcohol intake was 01/23/2022.  Labs with Hb 11.5, was 14.1 -12.5 during recent admission.  Platelets, WBCs, MCV normal BUN is 45, was between 14 and 16.  Creatinine improved at 1.2 compared with 1.4.  Na 135.  LFTs normal. FOBT +, stool brown on DRE.    Poor social situation at home with no running water or electricity.  Discharged to stay with his sister. Prior to the recent admission would consume a case of beer over the period of 2 days.  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  Family history negative for peptic ulcer disease, gastrointestinal cancers, anemia.  Father had neuropathy.  Patient says father was an alcoholic but sister does not think so.  There was no history of liver disease.  Past Medical History:  Diagnosis Date   Cancer [facility])    metastatic melanoma- spread  to lungs, two "spots" on brain per xray- last chemo tx in 2014, September- "all clear", no tx now   Diabetes mellitus without complication ([place])    Hyperlipidemia    Hypertension     Past Surgical History:  Procedure Laterality Date   BRAIN SURGERY  2015   to check for possible malignancy from melanoma, result were scar tissue   melanoma exicison     right leg    Prior to Admission medications   Medication Sig Start Date End Date Taking? Authorizing Provider  aspirin EC 81 MG EC tablet Take 1 tablet (81 mg total) by mouth daily. Swallow whole. 01/27/22   Phillips Grout, MD  atorvastatin (LIPITOR) 20 MG tablet Take 1 tablet (20 mg total) by mouth  daily. 01/26/22   Phillips Grout, MD  clopidogrel (PLAVIX) 75 MG tablet Take 1 tablet (75 mg total) by mouth daily. 01/26/22 01/26/23  Phillips Grout, MD  lisinopril-hydrochlorothiazide (ZESTORETIC) 20-12.5 MG tablet Take 2 tablets by mouth daily. 11/11/21   Kenneth [name], Kenneth Halsted, MD  metFORMIN (GLUCOPHAGE) 1000 MG tablet Take 1 tablet (1,000 mg total) by mouth 2 (two) times daily with a meal. 01/26/22   Phillips Grout, MD  Multiple Vitamin (MULTIVITAMIN WITH MINERALS) TABS tablet Take 1 tablet by mouth daily. 01/27/22   Phillips Grout, MD    Scheduled Meds:  Infusions:  PRN Meds:    Allergies as of 02/01/2022 - Review Complete 02/01/2022  Allergen Reaction Noted   Cat hair extract Shortness Of Breath and Swelling 08/12/2015    Family History  Problem Relation Age of Onset   Prostate cancer Father    Stomach cancer Paternal Grandmother    Colon cancer Neg Hx    Esophageal cancer Neg Hx    Rectal cancer Neg Hx     Social History   Socioeconomic History   Marital status: Single    Spouse name: Not on file   Number of children: Not on file   Years of education: Not on file   Highest education level: Not on file  Occupational History   Not on file  Tobacco Use   Smoking status: Never   Smokeless tobacco: Current    Types: Snuff  Substance and Sexual Activity   Alcohol use: No   Drug use: No   Sexual activity: Not on file  Other Topics Concern   Not on file  Social History Narrative   Not on file   Social Determinants of Health   Financial Resource Strain: Not on file  Food Insecurity: Not on file  Transportation Needs: Not on file  Physical Activity: Not on file  Stress: Not on file  Social Connections: Not on file  Intimate Partner Violence: Not on file    REVIEW OF SYSTEMS: Constitutional: Weakness.  No profound fatigue just weakness. ENT:  No nose bleeds Pulm: No shortness of breath or cough. CV:  No palpitations, no LE edema.  No  angina. GU: Incontinence.  No hematuria, no frequency GI: Per HPI. Heme: Denies unusual or excessive bleeding or bruising. Transfusions: No prior transfusions of blood products. Neuro:  No headaches, no seizures.  Positive numbness, tingling in lower legs ongoing for a long time.  Sister says his speech is a little "thick tongued" Derm:  No itching, no rash or sores.  Endocrine:  No sweats or chills.  No polyuria or dysuria Immunization: Not queried. Travel: Not queried   PHYSICAL EXAM: Vital signs in last 24 hours:  Vitals:   02/01/22 1500 02/01/22 1515  BP: 127/63 125/72  Pulse: 63 62  Resp: 12 14  Temp:    SpO2: 92% 100%   Wt Readings from Last 3 Encounters:  01/24/22 66 kg  11/11/21 65.5 kg  11/06/18 91.4 kg    General: Patient looks moderately unwell but comfortable, alert. Head: No facial asymmetry or swelling.  No signs of head trauma. Eyes: Conjunctiva pink.  No scleral icterus. Ears: No hearing deficit Nose: No congestion or discharge Mouth: Oral mucosa moist, pink, clear.  Tongue midline.  Good dentition. Neck: No JVD, no masses, no thyromegaly. Lungs: Clear bilaterally with good breath sounds.  No labored breathing or cough Heart: RRR.  No MRG.  S1, S2 present. Abdomen: Soft without distention or tenderness.  Active bowel sounds.  Do not appreciate organomegaly, hernias, bruits..   Rectal: Deferred.  DRE by ED provider earlier revealed brown, FOBT positive stool. Musc/Skeltl: No joint redness, swelling or gross deformities. Extremities: No CCE. Neurologic: No tremors.  Moves all 4 limbs without gross deficits.  Oriented x3.  Speech clear. Skin: ?telangiectasia on the upper back?  Scars from melanoma resection on right anterior thigh and from skin graft harvest on left anterior thigh. Tattoos: None observed Nodes: No cervical adenopathy Psych: Pleasant affect.  Cooperative.  Calm.  Intake/Output from previous day: No intake/output data recorded. Intake/Output  this shift: No intake/output data recorded.  LAB RESULTS: Recent Labs    02/01/22 1049  WBC 8.6  HGB 11.5*  HCT 32.3*  PLT 353   BMET Lab Results  Component Value Date   NA 135 02/01/2022   NA 127 (L) 01/25/2022   NA 126 (L) 01/25/2022   K 3.6 02/01/2022   K 4.2 01/25/2022   K 3.7 01/25/2022   CL 97 (L) 02/01/2022   CL 90 (L) 01/25/2022   CL 85 (L) 01/25/2022   CO2 27 02/01/2022   CO2 30 01/25/2022   CO2 30 01/25/2022   GLUCOSE 148 (H) 02/01/2022   GLUCOSE 148 (H) 01/25/2022   GLUCOSE 216 (H) 01/25/2022   BUN 45 (H) 02/01/2022   BUN 14 01/25/2022   BUN 15 01/25/2022   CREATININE 1.27 (H) 02/01/2022   CREATININE 1.36 (H) 01/25/2022   CREATININE 1.39 (H) 01/25/2022   CALCIUM 9.2 02/01/2022   CALCIUM 9.0 01/25/2022   CALCIUM 9.0 01/25/2022   LFT Recent Labs    02/01/22 1049  PROT 5.8*  ALBUMIN 3.2*  AST 12*  ALT 11  ALKPHOS 34*  BILITOT 0.7   PT/INR No results found for: INR, PROTIME Hepatitis Panel No results for input(s): HEPBSAG, HCVAB, HEPAIGM, HEPBIGM in the last 72 hours. C-Diff No components found for: CDIFF Lipase  No results found for: LIPASE  Drugs of Abuse     Component Value Date/Time   LABOPIA NONE DETECTED 01/25/2022 1612   COCAINSCRNUR NONE DETECTED 01/25/2022 1612   LABBENZ NONE DETECTED 01/25/2022 1612   AMPHETMU NONE DETECTED 01/25/2022 1612   THCU NONE DETECTED 01/25/2022 1612   LABBARB NONE DETECTED 01/25/2022 1612     RADIOLOGY STUDIES: No results found.   IMPRESSION:   Anorexia, weight loss over several months.  FOBT positive without overt bleeding.  Hgb drop 2 gms in 10 days.  Folate okay, B12 low normal range, thiamine normal on 01/2020/23.  Did not have iron studies.     Recent CVA without significant residual symptoms.  Never got prescription for 3 weeks of Plavix as was intended.  Hx melanoma with  mets to brain and lungs several years ago.  Previously followed at [facility] but lost to follow-up since 2020.  Alcohol  use disorder.  Abstinent since 01/23/2022   LFTs recently and currently normal w exception of Albumin 3.2.  Adenomatous and hyperplastic colon polyps along with diverticulosis on 12/2016 colonoscopy.  Not due for surveillance study until 12/2023 according to letter sent out by Dr. Loletha Carrow last week.    PLAN:       Ordered CT chest, abdomen, pelvis for evaluation of weight loss, anorexia and history melanoma.  Iron studies.  Ordered.    Clear liquid diet.  Does not require IV Protonix.  At most could prescribe Protonix 40 po bid.     Azucena Freed  02/01/2022, 3:44 PM Phone [phone]    [facility] Attending   Weight loss, anorexia, heme + and slight anemia.  Start w/ CT  See where that leads [place].  If that is not helpful would likely do EGD.  Will make NPO after 0500 tomorrow in case that is needed and possible tomorrow.  His B12 is low NL - I think B12 injection x 1 appropriate and have ordered.  Gatha Mayer, MD, [facility] 02/01/2022 5:58 PM

## 2022-02-01 NOTE — Assessment & Plan Note (Addendum)
Recent stroke in February.  At that time he was discharged home with outpatient therapy.  Supposed to be on aspirin and Plavix but had not filled his Plavix prescription yet.   ?Cleared by GI to resume his antiplatelet agents.  We will prescribe aspirin and Plavix for 3 weeks followed by aspirin alone. ?

## 2022-02-01 NOTE — ED Notes (Signed)
Hospitalist at bedside 

## 2022-02-01 NOTE — Telephone Encounter (Signed)
[name],Kenneth is aware. ?

## 2022-02-01 NOTE — Assessment & Plan Note (Addendum)
He is in remission for 7 years.  Followed at [facility].  Recent MRI brain did not show any metastatic process. ?

## 2022-02-01 NOTE — Assessment & Plan Note (Deleted)
-  I have discussed code status with the patient and his sister and  they are in agreement that the patient would not desire resuscitation and would prefer to die a natural death should that situation arise. ?-He will need a gold out of facility DNR form at the time of discharge ?

## 2022-02-01 NOTE — Assessment & Plan Note (Addendum)
-  Continue Lipitor °

## 2022-02-01 NOTE — ED Provider Notes (Addendum)
[facility] EMERGENCY DEPARTMENT Provider Note   CSN: [id number] Arrival date & time: 02/01/22  1010     History  Chief Complaint  Patient presents with   Weakness   non ambulatory   Urinary Incontinence    Kenneth [name] is a 61 y.o. male who presents to the emergency department brought in by his sister with concerns for weakness since being discharged from the hospital on 01/26/2022.  Patient sister notes that patient is nonambulatory and patient's sister is requesting that patient be placed in long-term rehab facility.  Pt sister also notes that the pt hasn't been eating well either. Patient sister also notes patient has urinary incontinence ongoing for 2 years.  Patient has been taking his medications as prescribed since being discharged from the hospital.  Patient's sister notes she is still not able to get the prescription of Plavix filled. Denies chest pain, shortness of breath, abdominal pain, nausea, vomiting, fever, chills, dysuria, hematuria. Denies blood thinners.  Patient notes he has not consumed alcohol since 01/23/2022.   The history is provided by the patient. No language interpreter was used.      Home Medications Prior to Admission medications   Medication Sig Start Date End Date Taking? Authorizing Provider  aspirin EC 81 MG EC tablet Take 1 tablet (81 mg total) by mouth daily. Swallow whole. 01/27/22   Phillips Grout, MD  atorvastatin (LIPITOR) 20 MG tablet Take 1 tablet (20 mg total) by mouth daily. 01/26/22   Phillips Grout, MD  clopidogrel (PLAVIX) 75 MG tablet Take 1 tablet (75 mg total) by mouth daily. 01/26/22 01/26/23  Phillips Grout, MD  lisinopril-hydrochlorothiazide (ZESTORETIC) 20-12.5 MG tablet Take 2 tablets by mouth daily. 11/11/21   Isaac Bliss, Rayford Halsted, MD  metFORMIN (GLUCOPHAGE) 1000 MG tablet Take 1 tablet (1,000 mg total) by mouth 2 (two) times daily with a meal. 01/26/22   Phillips Grout, MD  Multiple Vitamin (MULTIVITAMIN  WITH MINERALS) TABS tablet Take 1 tablet by mouth daily. 01/27/22   Phillips Grout, MD      Allergies    Cat hair extract    Review of Systems   Review of Systems  Constitutional:  Positive for chills. Negative for fever.  Respiratory:  Negative for shortness of breath.   Cardiovascular:  Negative for chest pain.  Gastrointestinal:  Negative for blood in stool, diarrhea, nausea and vomiting.  Genitourinary:  Negative for dysuria and hematuria.  Neurological:  Positive for weakness.  All other systems reviewed and are negative.  Physical Exam Updated Vital Signs BP 127/63    Pulse 63    Temp 98.3 F (36.8 C) (Oral)    Resp 12    SpO2 92%  Physical Exam Vitals and nursing note reviewed. Exam conducted with a chaperone present.  Constitutional:      General: He is not in acute distress.    Appearance: He is not diaphoretic.  HENT:     Head: Normocephalic and atraumatic.     Mouth/Throat:     Pharynx: No oropharyngeal exudate.  Eyes:     General: No scleral icterus.    Conjunctiva/sclera: Conjunctivae normal.  Cardiovascular:     Rate and Rhythm: Normal rate and regular rhythm.     Pulses: Normal pulses.     Heart sounds: Normal heart sounds.  Pulmonary:     Effort: Pulmonary effort is normal. No respiratory distress.     Breath sounds: Normal breath sounds. No wheezing.  Abdominal:     General: Bowel sounds are normal.     Palpations: Abdomen is soft. There is no mass.     Tenderness: There is abdominal tenderness in the suprapubic area. There is no guarding or rebound.     Comments: Tenderness to palpation to suprapubic region.  Genitourinary:    Prostate: Normal.     Rectum: Guaiac result positive. No mass, tenderness, external hemorrhoid or internal hemorrhoid. Normal anal tone.     Comments: RN chaperone present for exam. Soiled diaper consistent of urine and stool prior to exam. Musculoskeletal:        General: Normal range of motion.     Cervical back: Normal  range of motion and neck supple.     Comments: Strength and sensation intact to bilateral upper and lower extremities.  Skin:    General: Skin is warm and dry.  Neurological:     General: No focal deficit present.     Mental Status: He is alert.     Cranial Nerves: Cranial nerves 2-12 are intact.     Sensory: Sensation is intact.  Psychiatric:        Behavior: Behavior normal.    ED Results / Procedures / Treatments   Labs (all labs ordered are listed, but only abnormal results are displayed) Labs Reviewed  CBC WITH DIFFERENTIAL/PLATELET - Abnormal; Notable for the following components:      Result Value   RBC 3.59 (*)    Hemoglobin 11.5 (*)    HCT 32.3 (*)    All other components within normal limits  COMPREHENSIVE METABOLIC PANEL - Abnormal; Notable for the following components:   Chloride 97 (*)    Glucose, Bld 148 (*)    BUN 45 (*)    Creatinine, Ser 1.27 (*)    Total Protein 5.8 (*)    Albumin 3.2 (*)    AST 12 (*)    Alkaline Phosphatase 34 (*)    All other components within normal limits  MAGNESIUM - Abnormal; Notable for the following components:   Magnesium 1.3 (*)    All other components within normal limits  CBG MONITORING, ED - Abnormal; Notable for the following components:   Glucose-Capillary 139 (*)    All other components within normal limits  POC OCCULT BLOOD, ED - Abnormal; Notable for the following components:   Fecal Occult Bld POSITIVE (*)    All other components within normal limits  URINALYSIS, ROUTINE W REFLEX MICROSCOPIC    EKG EKG Interpretation  Date/Time:  Wednesday February 01 2022 10:28:06 EST Ventricular Rate:  62 PR Interval:  154 QRS Duration: 86 QT Interval:  428 QTC Calculation: 434 R Axis:   78 Text Interpretation: Normal sinus rhythm Confirmed by Godfrey Pick ([id number]) on 02/01/2022 12:38:19 PM  Radiology No results found.  Procedures Procedures    Medications Ordered in ED Medications  magnesium sulfate IVPB 2 g 50 mL (2 g  Intravenous New Bag/Given 02/01/22 1425)  pantoprazole (PROTONIX) injection 40 mg (has no administration in time range)  sodium chloride 0.9 % bolus 1,000 mL (1,000 mLs Intravenous New Bag/Given 02/01/22 1425)    ED Course/ Medical Decision Making/ A&P Clinical Course as of 02/01/22 1515  Wed Feb 01, 2022  1246 Magnesium(!): 1.3 Will replete [SB]  1247 Hemoglobin(!): 11.5 Will check hemoccult card in ED. Pt denies blood in stool, coughing up blood, or blood in vomit. [SB]  6644 Discussed with patient nurse to ambulate patient. [SB]  1434 Fecal Occult Blood,  POC(!): POSITIVE Will give protonix [SB]    Clinical Course User Index [SB] Boyd Buffalo A, PA-C                           Medical Decision Making Amount and/or Complexity of Data Reviewed Labs:  Decision-making details documented in ED Course.  Risk Prescription drug management.   Pt presents with concerns for generalized weakness screen discharged from the hospital on 01/26/2022.  History provided mainly by patient's sister who is his caregiver.  Patient's sister notes that patient is nonambulatory, has urinary incontinence (ongoing 2 years).  Patient's sister also notes that she is not able to care for the patient due to caring for other members of their family.  Vital signs stable, patient afebrile, not tachycardic or hypoxic.  Patient denies chest pain, shortness of breath, abdominal pain, nausea, vomiting, fever, chills, dysuria, hematuria.  Denies blood thinners.  Patient is not consumed alcohol since 01/23/2022.  On exam patient with mild tenderness to palpation noted to suprapubic region, otherwise no acute cardiovascular, respiratory, abdominal exam findings. Differential diagnosis includes acute cystitis, GI bleed, electrolyte abnormality, CVA, TIA, hypoglycemia.   Additional history obtained:  Additional history obtained from Caregiver, patient's sister External records from outside source obtained and reviewed including:  Patient was admitted to the hospital from 01/24/2022-01/26/2022 for acute CVA.  Acute CVA was noted on MRI brain in the right thalamus with possible subacute infarct in the left pons.  Patient has a follow-up appointment with PT/OT on 02/03/2022.  Patient has a follow-up appointment with neurology on 03/03/2022.    EKG: Normal sinus rhythm.  No acute ST/T changes.  Labs:  I ordered, and personally interpreted labs.  The pertinent results include:   Magnesium low at 1.3. CBC with hemoglobin down at 11.5 from 12.5 during hospital admission otherwise unremarkable. CMP with glucose elevated at 148, BUN elevated at 45 (increased from 14 during hospital admission), creatinine at 1.27 (decreased from 1.37 during hospital admission), albumin slightly decreased at 3.2, AST decreased at 12, alk phos decreased at 34, no anion gap. CBG at 134. Urinalysis ordered and pending at time of signout.  Imaging: I ordered imaging studies including bladder scan ordered with results pending at signout.   Medications:  I ordered medication including mag sulfate, Protonix, IV fluids  Patient case discussed with Delia Heady, PA-C at sign-out. Plan at sign-out is pending bladder scan, cath, protonix, admission for GI bleed. Patient care transferred at sign out.   This chart was dictated using voice recognition software, Dragon. Despite the best efforts of this provider to proofread and correct errors, errors may still occur which can change documentation meaning.   Final Clinical Impression(s) / ED Diagnoses Final diagnoses:  Generalized weakness  Upper GI bleed    Rx / DC Orders ED Discharge Orders     None         Joci Dress A, PA-C 02/01/22 1537    Judianne Seiple A, PA-C 02/01/22 1538    Godfrey Pick, MD 02/02/22 1906

## 2022-02-01 NOTE — ED Notes (Signed)
CT notified that pt finished contrast ?

## 2022-02-01 NOTE — Assessment & Plan Note (Addendum)
Patient was recently hospitalized for acute stroke.  He was discharged home with outpatient therapy.  Apparently at home he has not been doing well and has been nonambulatory.  Has had significant loss of appetite as well as loss of about 60 pounds of weight in the last 2 to 3 months. ?He was started on mirtazapine.  Appetite seems to be improving. ?Seen by physical therapy and they recommend short-term rehab. ?

## 2022-02-01 NOTE — ED Triage Notes (Signed)
Sister stated, He has several issues slurred speech, non-ambulatory, Im just really at my end. He was discharged on the 23 and nobody has returned my call or helped me. He is also incontinence of urine. I also have a elderly mother I take care of.  ?

## 2022-02-01 NOTE — Telephone Encounter (Signed)
Already answered in phone message ?

## 2022-02-01 NOTE — Assessment & Plan Note (Addendum)
Blood pressure is better controlled on amlodipine and metoprolol.  Zestoretic has been discontinued for now.  Monitor blood pressures closely ?

## 2022-02-01 NOTE — ED Provider Triage Note (Signed)
Emergency Medicine Provider Triage Evaluation Note ? ?Kenneth [name] , a 61 y.o. male  was evaluated in triage.  Pt complains of decreased appetite.  He is accompanied by his sister who is his caregiver after the patient was discharged on 01-26-2022 from a stroke.  The sister reports that she does not think he should have been discharged and she does not know who [facility] ambulatory as he is only able to take a few steps without needing to sit back down again.  She reports he is incontinent of both bowel and bladder which is not anything new.  She is taking care of of her mother with dementia and cannot take care of both of them and is looking for possible SNF placement or rehab placement. Additionally, she reports that upon discharge, they did not fill his plavix. H/o alcohol use.  ? ?Review of Systems  ?Positive: Decreased appetite, incontinence ?Negative: Chest pain, shortness of breath ? ?Physical Exam  ?BP (!) 93/58 (BP Location: Right Arm)   Pulse (!) 59   Temp 98.3 ?F (36.8 ?C) (Oral)   Resp 16   SpO2 100%  ?Gen:   Awake, no distress   ?Resp:  Normal effort  ?MSK:   Moves extremities without difficulty, some RLE weakenss ?Other:  Cranial nerves II-XII intact. Normal finger to nose. Slightly shaky speech, but answering questions appropriately.  ? ?Medical Decision Making  ?Medically screening exam initiated at 10:40 AM.  Appropriate orders placed.  Kenneth [name] was informed that the remainder of the evaluation will be completed by another provider, this initial triage assessment does not replace that evaluation, and the importance of remaining in the ED until their evaluation is complete. ? ?Patient is hypotensive.  He was recently started on new medications upon discharge for his elevated blood pressure.  He also has not been eating and drinking as well. Will order basic labs.  ?  ?Sherrell Puller, PA-C ?02/01/22 1045 ? ?

## 2022-02-01 NOTE — ED Notes (Signed)
Pt given clear liquid meal tray and positioned for comfort. ?

## 2022-02-02 ENCOUNTER — Encounter (HOSPITAL_COMMUNITY): Payer: Self-pay | Admitting: Internal Medicine

## 2022-02-02 ENCOUNTER — Ambulatory Visit: Payer: Self-pay | Admitting: *Deleted

## 2022-02-02 ENCOUNTER — Other Ambulatory Visit: Payer: Self-pay

## 2022-02-02 DIAGNOSIS — K862 Cyst of pancreas: Secondary | ICD-10-CM

## 2022-02-02 DIAGNOSIS — R634 Abnormal weight loss: Secondary | ICD-10-CM | POA: Insufficient documentation

## 2022-02-02 DIAGNOSIS — N390 Urinary tract infection, site not specified: Secondary | ICD-10-CM | POA: Diagnosis present

## 2022-02-02 DIAGNOSIS — D649 Anemia, unspecified: Secondary | ICD-10-CM | POA: Diagnosis present

## 2022-02-02 DIAGNOSIS — I10 Essential (primary) hypertension: Secondary | ICD-10-CM

## 2022-02-02 DIAGNOSIS — K922 Gastrointestinal hemorrhage, unspecified: Principal | ICD-10-CM

## 2022-02-02 LAB — CBC
HCT: 33.8 % — ABNORMAL LOW (ref 39.0–52.0)
Hemoglobin: 11.5 g/dL — ABNORMAL LOW (ref 13.0–17.0)
MCH: 31.3 pg (ref 26.0–34.0)
MCHC: 34 g/dL (ref 30.0–36.0)
MCV: 91.8 fL (ref 80.0–100.0)
Platelets: 337 10*3/uL (ref 150–400)
RBC: 3.68 MIL/uL — ABNORMAL LOW (ref 4.22–5.81)
RDW: 12.1 % (ref 11.5–15.5)
WBC: 14 10*3/uL — ABNORMAL HIGH (ref 4.0–10.5)
nRBC: 0 % (ref 0.0–0.2)

## 2022-02-02 LAB — BASIC METABOLIC PANEL
Anion gap: 12 (ref 5–15)
BUN: 27 mg/dL — ABNORMAL HIGH (ref 6–20)
CO2: 23 mmol/L (ref 22–32)
Calcium: 8.7 mg/dL — ABNORMAL LOW (ref 8.9–10.3)
Chloride: 96 mmol/L — ABNORMAL LOW (ref 98–111)
Creatinine, Ser: 1 mg/dL (ref 0.61–1.24)
GFR, Estimated: >60 mL/min (ref >60)
Glucose, Bld: 102 mg/dL — ABNORMAL HIGH (ref 70–99)
Potassium: 4.6 mmol/L (ref 3.5–5.1)
Sodium: 131 mmol/L — ABNORMAL LOW (ref 135–145)

## 2022-02-02 LAB — GLUCOSE, CAPILLARY
Glucose-Capillary: 171 mg/dL — ABNORMAL HIGH (ref 70–99)
Glucose-Capillary: 117 mg/dL — ABNORMAL HIGH (ref 70–99)

## 2022-02-02 LAB — IRON AND TIBC
Iron: 45 ug/dL (ref 45–182)
Saturation Ratios: 22 % (ref 17.9–39.5)
TIBC: 203 ug/dL — ABNORMAL LOW (ref 250–450)
UIBC: 158 ug/dL

## 2022-02-02 LAB — RETICULOCYTES
Immature Retic Fract: 1.9 % — ABNORMAL LOW (ref 2.3–15.9)
RBC.: 3.71 MIL/uL — ABNORMAL LOW (ref 4.22–5.81)
Retic Count, Absolute: 41.2 10*3/uL (ref 19.0–186.0)
Retic Ct Pct: 1.1 % (ref 0.4–3.1)

## 2022-02-02 LAB — CBG MONITORING, ED
Glucose-Capillary: 107 mg/dL — ABNORMAL HIGH (ref 70–99)
Glucose-Capillary: 94 mg/dL (ref 70–99)
Glucose-Capillary: 187 mg/dL — ABNORMAL HIGH (ref 70–99)

## 2022-02-02 LAB — FERRITIN: Ferritin: 372 ng/mL — ABNORMAL HIGH (ref 24–336)

## 2022-02-02 MED ORDER — PEG-KCL-NACL-NASULF-NA ASC-C 100 G PO SOLR
0.5000 | Freq: Once | ORAL | Status: AC
Start: 2022-02-02 — End: 2022-02-02
  Administered 2022-02-02: 100 g via ORAL
  Filled 2022-02-02: qty 1

## 2022-02-02 MED ORDER — BOOST / RESOURCE BREEZE PO LIQD CUSTOM
1.0000 | Freq: Three times a day (TID) | ORAL | Status: DC
  Administered 2022-02-02 – 2022-02-03 (×2): 1 via ORAL

## 2022-02-02 MED ORDER — SODIUM CHLORIDE 0.9 % IV SOLN: INTRAVENOUS | Status: DC

## 2022-02-02 MED ORDER — PEG-KCL-NACL-NASULF-NA ASC-C 100 G PO SOLR: 0.5000 | Freq: Once | ORAL | Status: DC

## 2022-02-02 MED ORDER — SODIUM CHLORIDE 0.9 % IV SOLN
1.0000 g | INTRAVENOUS | Status: DC
  Administered 2022-02-02 – 2022-02-03 (×2): 1 g via INTRAVENOUS
  Filled 2022-02-02 (×3): qty 10

## 2022-02-02 MED ORDER — SODIUM CHLORIDE 0.9 % IV SOLN: INTRAVENOUS | Status: AC

## 2022-02-02 MED ORDER — BISACODYL 5 MG PO TBEC
20.0000 mg | DELAYED_RELEASE_TABLET | Freq: Once | ORAL | Status: AC
Start: 2022-02-02 — End: 2022-02-02
  Administered 2022-02-02: 20 mg via ORAL
  Filled 2022-02-02: qty 4

## 2022-02-02 MED ORDER — ENSURE ENLIVE PO LIQD: 237.0000 mL | Freq: Two times a day (BID) | ORAL | Status: DC

## 2022-02-02 MED ORDER — METOCLOPRAMIDE HCL 5 MG/ML IJ SOLN
10.0000 mg | Freq: Once | INTRAMUSCULAR | Status: AC
  Administered 2022-02-02: 10 mg via INTRAVENOUS
  Filled 2022-02-02: qty 2

## 2022-02-02 MED ORDER — PEG-KCL-NACL-NASULF-NA ASC-C 100 G PO SOLR
0.5000 | Freq: Once | ORAL | Status: AC
  Administered 2022-02-02: 100 g via ORAL
  Filled 2022-02-02 (×3): qty 1

## 2022-02-02 MED ORDER — CYANOCOBALAMIN 1000 MCG/ML IJ SOLN
1000.0000 ug | Freq: Once | INTRAMUSCULAR | Status: AC
  Administered 2022-02-02: 1000 ug via INTRAMUSCULAR
  Filled 2022-02-02: qty 1

## 2022-02-02 NOTE — H&P (View-Only) (Signed)
? ?Patient Name: Kenneth [name] ?Date of Encounter: 02/02/2022, 10:18 AM ?  ? Subjective  ?No change ?CT shows chronic pancreatitis and a cystic mass ? ? Objective  ?BP 131/71   Pulse 62   Temp 98.3 ?F (36.8 ?C) (Oral)   Resp 11   SpO2 100%  ?NAD ?Flat affect ? ?Lab Results  ?Component Value Date  ? WBC 14.0 (H) 02/02/2022  ? HGB 11.5 (L) 02/02/2022  ? HCT 33.8 (L) 02/02/2022  ? MCV 91.8 02/02/2022  ? PLT 337 02/02/2022  ? ?Lab Results  ?Component Value Date  ? CREATININE 1.00 02/02/2022  ? BUN 27 (H) 02/02/2022  ? NA 131 (L) 02/02/2022  ? K 4.6 02/02/2022  ? CL 96 (L) 02/02/2022  ? CO2 23 02/02/2022  ? ?Lab Results  ?Component Value Date  ? ALT 11 02/01/2022  ? AST 12 (L) 02/01/2022  ? ALKPHOS 34 (L) 02/01/2022  ? BILITOT 0.7 02/01/2022  ? ? ? ?CT CHEST ABDOMEN PELVIS W CONTRAST ?CLINICAL DATA:  Unintended weight loss.  Melanoma ? ?EXAM: ?CT CHEST, ABDOMEN, AND PELVIS WITH CONTRAST ? ?TECHNIQUE: ?Multidetector CT imaging of the chest, abdomen and pelvis was ?performed following the standard protocol during bolus ?administration of intravenous contrast. ? ?RADIATION DOSE REDUCTION: This exam was performed according to the ?departmental dose-optimization program which includes automated ?exposure control, adjustment of the mA and/or kV according to ?patient size and/or use of iterative reconstruction technique. ? ?CONTRAST:  122mL OMNIPAQUE IOHEXOL 350 MG/ML SOLN ? ?COMPARISON:  11/10/2019 ? ?FINDINGS: ?CT CHEST FINDINGS ? ?Cardiovascular: Heart is normal size. Scattered coronary artery and ?aortic calcifications. No aneurysm. Retroesophageal right subclavian ?artery noted. ? ?Mediastinum/Nodes: No mediastinal, hilar, or axillary adenopathy. ?Trachea and esophagus are unremarkable. Thyroid unremarkable. Small ?hiatal hernia. ? ?Lungs/Pleura: Lungs are clear. No focal airspace opacities or ?suspicious nodules. No effusions. ? ?Musculoskeletal: Chest wall soft tissues are unremarkable. No acute ?bony  abnormality. Old healed fractures bilaterally. ? ?CT ABDOMEN PELVIS FINDINGS ? ?Hepatobiliary: No focal hepatic abnormality. Gallbladder ?unremarkable. ? ?Pancreas: No focal hepatic abnormality. Gallbladder unremarkable. ?Calcifications in the pancreatic head and body compatible with ?chronic pancreatitis. Mixed density predominantly low-density mass ?noted in the pancreatic head/uncinate process measuring 3.6 x 3.4 ?cm. There is pancreatic ductal dilatation. Pancreatic duct measures ?up to 7 mm in the pancreatic head/body. ? ?Spleen: No focal abnormality.  Normal size. ? ?Adrenals/Urinary Tract: No adrenal abnormality. No focal renal ?abnormality. No stones or hydronephrosis. Urinary bladder is ?unremarkable. ? ?Stomach/Bowel: Stomach, large and small bowel grossly unremarkable. ? ?Vascular/Lymphatic: Aortic atherosclerosis. No evidence of aneurysm ?or adenopathy. ? ?Reproductive: Mildly prominent prostate ? ?Other: No free fluid or free air. ? ?Musculoskeletal: No acute bony abnormality. ? ?IMPRESSION: ?No acute cardiopulmonary disease. ? ?Coronary artery disease, aortic atherosclerosis. ? ?Small hiatal hernia. ? ?Changes of chronic pancreatitis. ? ?New cystic mass within the pancreatic head/uncinate process ?measuring up to 3.6 cm. Pancreatic ductal dilatation noted. Findings ?concerning for possible cystic pancreatic neoplasm. When the patient ?is clinically stable and able to follow directions and hold their ?breath (preferably as an outpatient) further evaluation with ?dedicated abdominal MRI should be considered. ? ?Electronically Signed ?  By: Rolm Baptise M.D. ?  On: 02/02/2022 00:10 ? ? ? ? ? Assessment and Plan  ?Weight loss ?Anorexia ?FOBT + ?Cystic mass of pancreas and changes of chronic pancreatitis (EtOH) ?Mild anemia - low NL B12, elevated ferritin low TIBC and low NL sat ?Recent stroke ?Alcoholism recently abstinent ?Melanoma (cured) ? ?As I look  at things now suspect he is depressed and that may be  underlying issue. ?That said has heme + anemia so will go ahead w/ EGD and a colonoscopy tomorrow. ?The risks and benefits as well as alternatives of endoscopic procedure(s) have been discussed and reviewed. All questions answered. The patient agrees to proceed. ? ?Will also check CA 19-9. I suspect he does NOT have pancreatic cancer but possibly a precancerous lesion, based upon CT findings. That is hypothesis and will need to befurther assessed. I am thinking EUS which will be more definitive but cannot be done anytime soon and will be outpatient. ? ?Also will administer IM B12 x1 ? ?We should consider adding mirtazapine to regimen. Await EGD/colonoscopy ? ? ?Gatha Mayer, MD, Marval Regal ?[facility] ?02/02/2022 10:18 AM ? ?Reviewed findings w/ colleague who does EUS - await CA 19-9 and if elevated significantly will pursue EUS (outpatient) if not then consider repeat interval imaging CT vs MR ? ?Gatha Mayer, MD, Marval Regal ?[facility] ?02/02/2022 1:16 PM ? ? ?

## 2022-02-02 NOTE — Anesthesia Preprocedure Evaluation (Addendum)
Anesthesia Evaluation  ?Patient identified by MRN, date of birth, ID band ?Patient awake ? ? ? ?Reviewed: ?Allergy & Precautions, H&P , NPO status , Patient's Chart, lab work & pertinent test results ? ?Airway ?Mallampati: II ? ?TM Distance: >3 FB ?Neck ROM: Full ? ? ? Dental ?no notable dental hx. ?(+) Teeth Intact, Dental Advisory Given ?  ?Pulmonary ?neg pulmonary ROS,  ?  ?Pulmonary exam normal ?breath sounds clear to auscultation ? ? ? ? ? ? Cardiovascular ?Exercise Tolerance: Good ?hypertension, Pt. on medications and Pt. on home beta blockers ? ?Rhythm:Regular Rate:Normal ? ? ?  ?Neuro/Psych ?CVA negative psych ROS  ? GI/Hepatic ?negative GI ROS, Neg liver ROS,   ?Endo/Other  ?diabetes, Insulin Dependent ? Renal/GU ?Renal disease  ?negative genitourinary ?  ?Musculoskeletal ? ? Abdominal ?  ?Peds ? Hematology ? ?(+) Blood dyscrasia, anemia ,   ?Anesthesia Other Findings ? ? Reproductive/Obstetrics ?negative OB ROS ? ?  ? ? ? ? ? ? ? ? ? ? ? ? ? ?  ?  ? ? ? ? ? ? ? ?Anesthesia Physical ?Anesthesia Plan ? ?ASA: 3 ? ?Anesthesia Plan: MAC  ? ?Post-op Pain Management: Minimal or no pain anticipated  ? ?Induction: Intravenous ? ?PONV Risk Score and Plan: 1 and Propofol infusion ? ?Airway Management Planned: Nasal Cannula and Natural Airway ? ?Additional Equipment:  ? ?Intra-op Plan:  ? ?Post-operative Plan:  ? ?Informed Consent: I have reviewed the patients History and Physical, chart, labs and discussed the procedure including the risks, benefits and alternatives for the proposed anesthesia with the patient or authorized representative who has indicated his/her understanding and acceptance.  ? ?Patient has DNR.  ?Discussed DNR with patient and Suspend DNR. ?  ?Dental advisory given ? ?Plan Discussed with: CRNA ? ?Anesthesia Plan Comments:   ? ? ? ? ? ?Anesthesia Quick Evaluation ? ?

## 2022-02-02 NOTE — Assessment & Plan Note (Addendum)
UA was noted to be remarkably abnormal.  Patient did mention urinary frequency.  CT scan does not show any abnormality in the GU tract.  Started on ceftriaxone.  Urine culture is growing E. coli.  Noted to be pansensitive.  Changed over to cephalexin.  Treatment to end on 3/8. ?

## 2022-02-02 NOTE — Assessment & Plan Note (Addendum)
A CT scan of the chest abdomen pelvis was ordered due to his history of weight loss. ?This revealed findings of chronic pancreatitis along with a new cystic mass within the pancreatic head measuring up to 3.6 cm.  Pancreatic ductal dilatation noted.  Concern is for cystic pancreatic neoplasm.  GI is managing this.  CA 19-9 level was noted to be normal.  Based on GI notes the plan would be to do MRI/MRCP in the outpatient setting.  GI to arrange this. ?

## 2022-02-02 NOTE — Hospital Course (Addendum)
61 y.o. 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 presented to the emergency department with significant fatigue, poor oral intake and weight loss.  Found to have occult blood on stool examination.  He was hospitalized for further management.  Seen by gastroenterology.  Underwent EGD and colonoscopy on 3/3.  Waiting on skilled nursing facility placement. ?

## 2022-02-02 NOTE — Progress Notes (Signed)
[facility] PROGRESS NOTE   Kenneth [name] ZOX:[id number] DOB: 09-11-1961 DOA: 02/01/2022  1 DOS: the patient was seen and examined on 02/02/2022  PCP: Philip Aspen, Limmie Patricia, MD  Brief History and Hospital Course:  61 y.o. 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 presented to the emergency department with significant fatigue, poor oral intake and weight loss.  Found to have occult blood on stool examination.  He was hospitalized for further management.    Consultants: Gastroenterology  Procedures: None yet    Subjective: Patient denies any abdominal pain currently.  No shortness of breath or chest pain.  Feels fatigued.  Reports significant weight loss as well as loss of appetite.    Assessment/Plan:   * Failure to thrive in adult- (present on admission) Patient was recently hospitalized for acute stroke.  He was discharged home with outpatient therapy.  Apparently at home he has not been doing well and has been nonambulatory.  Has had significant loss of appetite as well as loss of about 60 pounds of weight in the last 2 to 3 months. PT OT consult is pending.  See below for other issues.  Occult GI bleeding- (present on admission) Patient noted to have downtrending hemoglobin.  Noted to be heme positive on stool examination.  Gastroenterology was consulted.  Patient started on PPI.  No overt bleeding noted.  Hemoglobin noted to be stable. GI tentatively considering upper endoscopy during this admission.  Pancreatic cyst- (present on admission) A CT scan of the chest abdomen pelvis was ordered yesterday due to his history of weight loss. This revealed findings of chronic pancreatitis along with a new cystic mass within the pancreatic head measuring up to 3.6 cm.   Pancreatic ductal dilatation noted.  Concern is for cystic pancreatic neoplasm.  Will likely need further work-up.  Will wait for GI input regarding this matter.  Urinary tract infection- (present on admission) UA noted to be remarkably abnormal.  Patient does mention urinary frequency.  CT scan does not show any abnormality in the GU tract.  We will place him on ceftriaxone.  Follow-up on cultures.  CVA (cerebral vascular accident) [facility])- (present on admission) Recent stroke in February.  Was discharged home with outpatient therapy.  Supposed to be on aspirin and Plavix but has not filled his Plavix prescription yet.  Currently on hold due to concern for GI bleed.  Continue statin.  Monitor blood pressure.  PT and OT evaluation.  Diabetes mellitus without complication (HCC) Recent HbA1c 6.6.  Glucophage on hold.  Continue SSI.  Normocytic anemia- (present on admission) Hemoglobin is stable.  Ferritin 372.  Continue to monitor  Essential hypertension- (present on admission) Continue amlodipine and metoprolol.  Zestoretic on hold.  Metastatic melanoma to lung [facility])- (present on admission) In remission for 7 years.  Followed at [facility].  Recent MRI brain did not show any metastatic process.  Dyslipidemia- (present on admission) Continue Lipitor     DVT Prophylaxis: SCDs Code Status: DNR Family Communication: Discussed with the patient Disposition Plan: To be determined  Status is: Inpatient Remains inpatient appropriate because: Concern for GI bleed, urinary tract infection, significant weight loss       Medications: Scheduled:  amLODipine  10 mg Oral Daily   aspirin EC  81 mg Oral Daily  atorvastatin  20 mg Oral Daily   insulin aspart  0-5 Units Subcutaneous QHS   insulin aspart  0-9 Units Subcutaneous TID WC   metoprolol tartrate  100 mg Oral BID   pantoprazole  40 mg Oral BID   sodium chloride flush  3 mL Intravenous Q12H   Continuous:  lactated ringers 100  mL/hr at 02/02/22 0411   UEA:[id number] **OR** acetaminophen, hydrALAZINE, morphine injection, ondansetron **OR** ondansetron (ZOFRAN) IV  Antibiotics: Anti-infectives (From admission, onward)    None       Objective:  Vital Signs  Vitals:   02/02/22 0730 02/02/22 0800 02/02/22 0830 02/02/22 0900  BP: 128/66 127/68 128/67 131/71  Pulse: 60 (!) 57 63 62  Resp: 11 13 12 11   Temp:      TempSrc:      SpO2: 100% 99% 100% 100%    Intake/Output Summary (Last 24 hours) at 02/02/2022 9562 Last data filed at 02/01/2022 1632 Gross per 24 hour  Intake --  Output 100 ml  Net -100 ml   There were no vitals filed for this visit.  General appearance: Awake alert.  In no distress Resp: Clear to auscultation bilaterally.  Normal effort Cardio: S1-S2 is normal regular.  No S3-S4.  No rubs murmurs or bruit GI: Abdomen is soft.  Nontender nondistended.  Bowel sounds are present normal.  No masses organomegaly Extremities: No edema.  Able to move all of his extremities Neurologic: Alert and oriented x3.  No focal neurological deficits.    Lab Results:  Data Reviewed: I have personally reviewed labs and imaging study reports  CBC: Recent Labs  Lab 02/01/22 1049 02/02/22 0830  WBC 8.6 14.0*  NEUTROABS 6.8  --   HGB 11.5* 11.5*  HCT 32.3* 33.8*  MCV 90.0 91.8  PLT 353 337    Basic Metabolic Panel: Recent Labs  Lab 02/01/22 1049 02/02/22 0649  NA 135 131*  K 3.6 4.6  CL 97* 96*  CO2 27 23  GLUCOSE 148* 102*  BUN 45* 27*  CREATININE 1.27* 1.00  CALCIUM 9.2 8.7*  MG 1.3*  --     GFR: Estimated Creatinine Clearance: 65.8 mL/min (by C-G formula based on SCr of 1 mg/dL).  Liver Function Tests: Recent Labs  Lab 02/01/22 1049  AST 12*  ALT 11  ALKPHOS 34*  BILITOT 0.7  PROT 5.8*  ALBUMIN 3.2*    CBG: Recent Labs  Lab 01/26/22 1141 02/01/22 1048 02/01/22 2225 02/02/22 0748  GLUCAP 136* 139* 98 107*     Anemia Panel: Recent Labs    02/02/22 0649  02/02/22 0830  FERRITIN 372*  --   TIBC 203*  --   IRON 45  --   RETICCTPCT  --  1.1    Recent Results (from the past 240 hour(s))  Resp Panel by RT-PCR (Flu A&B, Covid) Nasopharyngeal Swab     Status: None   Collection Time: 01/24/22  4:21 PM   Specimen: Nasopharyngeal Swab; Nasopharyngeal(NP) swabs in vial transport medium  Result Value Ref Range Status   SARS Coronavirus 2 by RT PCR NEGATIVE NEGATIVE Final    Comment: (NOTE) SARS-CoV-2 target nucleic acids are NOT DETECTED.  The SARS-CoV-2 RNA is generally detectable in upper respiratory specimens during the acute phase of infection. The lowest concentration of SARS-CoV-2 viral copies this assay can detect is 138 copies/mL. A negative result does not preclude SARS-Cov-2 infection and should not be used as the sole basis for treatment or other patient management decisions. A  negative result may occur with  improper specimen collection/handling, submission of specimen other than nasopharyngeal swab, presence of viral mutation(s) within the areas targeted by this assay, and inadequate number of viral copies(<138 copies/mL). A negative result must be combined with clinical observations, patient history, and epidemiological information. The expected result is Negative.  Fact Sheet for Patients:  BloggerCourse.com  Fact Sheet for Healthcare Providers:  SeriousBroker.it  This test is no t yet approved or cleared by the [place] FDA and  has been authorized for detection and/or diagnosis of SARS-CoV-2 by FDA under an Emergency Use Authorization (EUA). This EUA will remain  in effect (meaning this test can be used) for the duration of the COVID-19 declaration under Section 564(b)(1) of the Act, 21 U.S.C.section 360bbb-3(b)(1), unless the authorization is terminated  or revoked sooner.       Influenza A by PCR NEGATIVE NEGATIVE Final   Influenza B by PCR NEGATIVE NEGATIVE  Final    Comment: (NOTE) The Xpert Xpress SARS-CoV-2/FLU/RSV plus assay is intended as an aid in the diagnosis of influenza from Nasopharyngeal swab specimens and should not be used as a sole basis for treatment. Nasal washings and aspirates are unacceptable for Xpert Xpress SARS-CoV-2/FLU/RSV testing.  Fact Sheet for Patients: BloggerCourse.com  Fact Sheet for Healthcare Providers: SeriousBroker.it  This test is not yet approved or cleared by the [place] FDA and has been authorized for detection and/or diagnosis of SARS-CoV-2 by FDA under an Emergency Use Authorization (EUA). This EUA will remain in effect (meaning this test can be used) for the duration of the COVID-19 declaration under Section 564(b)(1) of the Act, 21 U.S.C. section 360bbb-3(b)(1), unless the authorization is terminated or revoked.  Performed at [facility], [address], [place], [place] [address]   Resp Panel by RT-PCR (Flu A&B, Covid) Nasopharyngeal Swab     Status: None   Collection Time: 02/01/22  7:41 PM   Specimen: Nasopharyngeal Swab; Nasopharyngeal(NP) swabs in vial transport medium  Result Value Ref Range Status   SARS Coronavirus 2 by RT PCR NEGATIVE NEGATIVE Final    Comment: (NOTE) SARS-CoV-2 target nucleic acids are NOT DETECTED.  The SARS-CoV-2 RNA is generally detectable in upper respiratory specimens during the acute phase of infection. The lowest concentration of SARS-CoV-2 viral copies this assay can detect is 138 copies/mL. A negative result does not preclude SARS-Cov-2 infection and should not be used as the sole basis for treatment or other patient management decisions. A negative result may occur with  improper specimen collection/handling, submission of specimen other than nasopharyngeal swab, presence of viral mutation(s) within the areas targeted by this assay, and inadequate number of viral copies(<138 copies/mL). A  negative result must be combined with clinical observations, patient history, and epidemiological information. The expected result is Negative.  Fact Sheet for Patients:  BloggerCourse.com  Fact Sheet for Healthcare Providers:  SeriousBroker.it  This test is no t yet approved or cleared by the [place] FDA and  has been authorized for detection and/or diagnosis of SARS-CoV-2 by FDA under an Emergency Use Authorization (EUA). This EUA will remain  in effect (meaning this test can be used) for the duration of the COVID-19 declaration under Section 564(b)(1) of the Act, 21 U.S.C.section 360bbb-3(b)(1), unless the authorization is terminated  or revoked sooner.       Influenza A by PCR NEGATIVE NEGATIVE Final   Influenza B by PCR NEGATIVE NEGATIVE Final    Comment: (NOTE) The Xpert Xpress SARS-CoV-2/FLU/RSV plus assay  is intended as an aid in the diagnosis of influenza from Nasopharyngeal swab specimens and should not be used as a sole basis for treatment. Nasal washings and aspirates are unacceptable for Xpert Xpress SARS-CoV-2/FLU/RSV testing.  Fact Sheet for Patients: BloggerCourse.com  Fact Sheet for Healthcare Providers: SeriousBroker.it  This test is not yet approved or cleared by the [place] FDA and has been authorized for detection and/or diagnosis of SARS-CoV-2 by FDA under an Emergency Use Authorization (EUA). This EUA will remain in effect (meaning this test can be used) for the duration of the COVID-19 declaration under Section 564(b)(1) of the Act, 21 U.S.C. section 360bbb-3(b)(1), unless the authorization is terminated or revoked.  Performed at [facility], [address] [place], [place] [address]       Radiology Studies: CT CHEST ABDOMEN PELVIS W CONTRAST  Result Date: 02/02/2022 CLINICAL DATA:  Unintended weight loss.  Melanoma EXAM: CT  CHEST, ABDOMEN, AND PELVIS WITH CONTRAST TECHNIQUE: Multidetector CT imaging of the chest, abdomen and pelvis was performed following the standard protocol during bolus administration of intravenous contrast. RADIATION DOSE REDUCTION: This exam was performed according to the departmental dose-optimization program which includes automated exposure control, adjustment of the mA and/or kV according to patient size and/or use of iterative reconstruction technique. CONTRAST:  OMNIPAQUE IOHEXOL 350 MG/ML SOLN COMPARISON:  11/10/2019 FINDINGS: CT CHEST FINDINGS Cardiovascular: Heart is normal size. Scattered coronary artery and aortic calcifications. No aneurysm. Retroesophageal right subclavian artery noted. Mediastinum/Nodes: No mediastinal, hilar, or axillary adenopathy. Trachea and esophagus are unremarkable. Thyroid unremarkable. Small hiatal hernia. Lungs/Pleura: Lungs are clear. No focal airspace opacities or suspicious nodules. No effusions. Musculoskeletal: Chest wall soft tissues are unremarkable. No acute bony abnormality. Old healed fractures bilaterally. CT ABDOMEN PELVIS FINDINGS Hepatobiliary: No focal hepatic abnormality. Gallbladder unremarkable. Pancreas: No focal hepatic abnormality. Gallbladder unremarkable. Calcifications in the pancreatic head and body compatible with chronic pancreatitis. Mixed density predominantly low-density mass noted in the pancreatic head/uncinate process measuring 3.6 x 3.4 cm. There is pancreatic ductal dilatation. Pancreatic duct measures up to 7 mm in the pancreatic head/body. Spleen: No focal abnormality.  Normal size. Adrenals/Urinary Tract: No adrenal abnormality. No focal renal abnormality. No stones or hydronephrosis. Urinary bladder is unremarkable. Stomach/Bowel: Stomach, large and small bowel grossly unremarkable. Vascular/Lymphatic: Aortic atherosclerosis. No evidence of aneurysm or adenopathy. Reproductive: Mildly prominent prostate Other: No free fluid or  free air. Musculoskeletal: No acute bony abnormality. IMPRESSION: No acute cardiopulmonary disease. Coronary artery disease, aortic atherosclerosis. Small hiatal hernia. Changes of chronic pancreatitis. New cystic mass within the pancreatic head/uncinate process measuring up to 3.6 cm. Pancreatic ductal dilatation noted. Findings concerning for possible cystic pancreatic neoplasm. When the patient is clinically stable and able to follow directions and hold their breath (preferably as an outpatient) further evaluation with dedicated abdominal MRI should be considered. Electronically Signed   By: Charlett Nose M.D.   On: 02/02/2022 00:10       LOS: 1 day   Wasyl Dornfeld [employer] on [email or web address]  02/02/2022, 9:21 AM

## 2022-02-02 NOTE — Progress Notes (Addendum)
? ?Patient Name: Kenneth [name] ?Date of Encounter: 02/02/2022, 10:18 AM ?  ? Subjective  ?No change ?CT shows chronic pancreatitis and a cystic mass ? ? Objective  ?BP 131/71   Pulse 62   Temp 98.3 ?F (36.8 ?C) (Oral)   Resp 11   SpO2 100%  ?NAD ?Flat affect ? ?Lab Results  ?Component Value Date  ? WBC 14.0 (H) 02/02/2022  ? HGB 11.5 (L) 02/02/2022  ? HCT 33.8 (L) 02/02/2022  ? MCV 91.8 02/02/2022  ? PLT 337 02/02/2022  ? ?Lab Results  ?Component Value Date  ? CREATININE 1.00 02/02/2022  ? BUN 27 (H) 02/02/2022  ? NA 131 (L) 02/02/2022  ? K 4.6 02/02/2022  ? CL 96 (L) 02/02/2022  ? CO2 23 02/02/2022  ? ?Lab Results  ?Component Value Date  ? ALT 11 02/01/2022  ? AST 12 (L) 02/01/2022  ? ALKPHOS 34 (L) 02/01/2022  ? BILITOT 0.7 02/01/2022  ? ? ? ?CT CHEST ABDOMEN PELVIS W CONTRAST ?CLINICAL DATA:  Unintended weight loss.  Melanoma ? ?EXAM: ?CT CHEST, ABDOMEN, AND PELVIS WITH CONTRAST ? ?TECHNIQUE: ?Multidetector CT imaging of the chest, abdomen and pelvis was ?performed following the standard protocol during bolus ?administration of intravenous contrast. ? ?RADIATION DOSE REDUCTION: This exam was performed according to the ?departmental dose-optimization program which includes automated ?exposure control, adjustment of the mA and/or kV according to ?patient size and/or use of iterative reconstruction technique. ? ?CONTRAST:  135mL OMNIPAQUE IOHEXOL 350 MG/ML SOLN ? ?COMPARISON:  11/10/2019 ? ?FINDINGS: ?CT CHEST FINDINGS ? ?Cardiovascular: Heart is normal size. Scattered coronary artery and ?aortic calcifications. No aneurysm. Retroesophageal right subclavian ?artery noted. ? ?Mediastinum/Nodes: No mediastinal, hilar, or axillary adenopathy. ?Trachea and esophagus are unremarkable. Thyroid unremarkable. Small ?hiatal hernia. ? ?Lungs/Pleura: Lungs are clear. No focal airspace opacities or ?suspicious nodules. No effusions. ? ?Musculoskeletal: Chest wall soft tissues are unremarkable. No acute ?bony  abnormality. Old healed fractures bilaterally. ? ?CT ABDOMEN PELVIS FINDINGS ? ?Hepatobiliary: No focal hepatic abnormality. Gallbladder ?unremarkable. ? ?Pancreas: No focal hepatic abnormality. Gallbladder unremarkable. ?Calcifications in the pancreatic head and body compatible with ?chronic pancreatitis. Mixed density predominantly low-density mass ?noted in the pancreatic head/uncinate process measuring 3.6 x 3.4 ?cm. There is pancreatic ductal dilatation. Pancreatic duct measures ?up to 7 mm in the pancreatic head/body. ? ?Spleen: No focal abnormality.  Normal size. ? ?Adrenals/Urinary Tract: No adrenal abnormality. No focal renal ?abnormality. No stones or hydronephrosis. Urinary bladder is ?unremarkable. ? ?Stomach/Bowel: Stomach, large and small bowel grossly unremarkable. ? ?Vascular/Lymphatic: Aortic atherosclerosis. No evidence of aneurysm ?or adenopathy. ? ?Reproductive: Mildly prominent prostate ? ?Other: No free fluid or free air. ? ?Musculoskeletal: No acute bony abnormality. ? ?IMPRESSION: ?No acute cardiopulmonary disease. ? ?Coronary artery disease, aortic atherosclerosis. ? ?Small hiatal hernia. ? ?Changes of chronic pancreatitis. ? ?New cystic mass within the pancreatic head/uncinate process ?measuring up to 3.6 cm. Pancreatic ductal dilatation noted. Findings ?concerning for possible cystic pancreatic neoplasm. When the patient ?is clinically stable and able to follow directions and hold their ?breath (preferably as an outpatient) further evaluation with ?dedicated abdominal MRI should be considered. ? ?Electronically Signed ?  By: Rolm Baptise M.D. ?  On: 02/02/2022 00:10 ? ? ? ? ? Assessment and Plan  ?Weight loss ?Anorexia ?FOBT + ?Cystic mass of pancreas and changes of chronic pancreatitis (EtOH) ?Mild anemia - low NL B12, elevated ferritin low TIBC and low NL sat ?Recent stroke ?Alcoholism recently abstinent ?Melanoma (cured) ? ?As I look  at things now suspect he is depressed and that may be  underlying issue. ?That said has heme + anemia so will go ahead w/ EGD and a colonoscopy tomorrow. ?The risks and benefits as well as alternatives of endoscopic procedure(s) have been discussed and reviewed. All questions answered. The patient agrees to proceed. ? ?Will also check CA 19-9. I suspect he does NOT have pancreatic cancer but possibly a precancerous lesion, based upon CT findings. That is hypothesis and will need to befurther assessed. I am thinking EUS which will be more definitive but cannot be done anytime soon and will be outpatient. ? ?Also will administer IM B12 x1 ? ?We should consider adding mirtazapine to regimen. Await EGD/colonoscopy ? ? ?Gatha Mayer, MD, Marval Regal ?[facility] ?02/02/2022 10:18 AM ? ?Reviewed findings w/ colleague who does EUS - await CA 19-9 and if elevated significantly will pursue EUS (outpatient) if not then consider repeat interval imaging CT vs MR ? ?Gatha Mayer, MD, Marval Regal ?[facility] ?02/02/2022 1:16 PM ? ? ?

## 2022-02-02 NOTE — Assessment & Plan Note (Addendum)
Hemoglobin is stable.  Ferritin 372.  ?

## 2022-02-02 NOTE — Progress Notes (Signed)
Pt alert and oriented at this time, signed consent for EGD /colonoscopy. Bowel prep initiated this evening ?

## 2022-02-03 ENCOUNTER — Inpatient Hospital Stay (HOSPITAL_COMMUNITY): Payer: BC Managed Care – PPO | Admitting: Anesthesiology

## 2022-02-03 ENCOUNTER — Ambulatory Visit: Payer: BC Managed Care – PPO | Admitting: Occupational Therapy

## 2022-02-03 ENCOUNTER — Encounter (HOSPITAL_COMMUNITY)
Admission: EM | Disposition: A | Discharge status: Skilled Nursing Facility | Payer: Self-pay | Source: Home / Self Care | Attending: Internal Medicine

## 2022-02-03 ENCOUNTER — Ambulatory Visit: Payer: BC Managed Care – PPO | Admitting: Physical Therapy

## 2022-02-03 DIAGNOSIS — L8990 Pressure ulcer of unspecified site, unspecified stage: Secondary | ICD-10-CM | POA: Insufficient documentation

## 2022-02-03 DIAGNOSIS — E43 Unspecified severe protein-calorie malnutrition: Secondary | ICD-10-CM | POA: Insufficient documentation

## 2022-02-03 DIAGNOSIS — K2970 Gastritis, unspecified, without bleeding: Secondary | ICD-10-CM

## 2022-02-03 HISTORY — PX: COLONOSCOPY WITH PROPOFOL: SHX5780

## 2022-02-03 HISTORY — PX: ESOPHAGOGASTRODUODENOSCOPY (EGD) WITH PROPOFOL: SHX5813

## 2022-02-03 HISTORY — PX: BIOPSY: SHX5522

## 2022-02-03 LAB — CBC
HCT: 32.3 % — ABNORMAL LOW (ref 39.0–52.0)
Hemoglobin: 11.3 g/dL — ABNORMAL LOW (ref 13.0–17.0)
MCH: 31.8 pg (ref 26.0–34.0)
MCHC: 35 g/dL (ref 30.0–36.0)
MCV: 91 fL (ref 80.0–100.0)
Platelets: 336 10*3/uL (ref 150–400)
RBC: 3.55 MIL/uL — ABNORMAL LOW (ref 4.22–5.81)
RDW: 11.9 % (ref 11.5–15.5)
WBC: 13.3 10*3/uL — ABNORMAL HIGH (ref 4.0–10.5)
nRBC: 0 % (ref 0.0–0.2)

## 2022-02-03 LAB — COMPREHENSIVE METABOLIC PANEL
ALT: 13 U/L (ref 0–44)
AST: 17 U/L (ref 15–41)
Albumin: 2.9 g/dL — ABNORMAL LOW (ref 3.5–5.0)
Alkaline Phosphatase: 36 U/L — ABNORMAL LOW (ref 38–126)
Anion gap: 8 (ref 5–15)
BUN: 15 mg/dL (ref 6–20)
CO2: 22 mmol/L (ref 22–32)
Calcium: 8.5 mg/dL — ABNORMAL LOW (ref 8.9–10.3)
Chloride: 103 mmol/L (ref 98–111)
Creatinine, Ser: 0.85 mg/dL (ref 0.61–1.24)
GFR, Estimated: >60 mL/min (ref >60)
Glucose, Bld: 131 mg/dL — ABNORMAL HIGH (ref 70–99)
Potassium: 3.5 mmol/L (ref 3.5–5.1)
Sodium: 133 mmol/L — ABNORMAL LOW (ref 135–145)
Total Bilirubin: 0.5 mg/dL (ref 0.3–1.2)
Total Protein: 5.8 g/dL — ABNORMAL LOW (ref 6.5–8.1)

## 2022-02-03 LAB — CANCER ANTIGEN 19-9: CA 19-9: 13 U/mL (ref 0–35)

## 2022-02-03 LAB — GLUCOSE, CAPILLARY: Glucose-Capillary: 327 mg/dL — ABNORMAL HIGH (ref 70–99)

## 2022-02-03 SURGERY — ESOPHAGOGASTRODUODENOSCOPY (EGD) WITH PROPOFOL
Anesthesia: Monitor Anesthesia Care

## 2022-02-03 MED ORDER — PROPOFOL 500 MG/50ML IV EMUL
INTRAVENOUS | Status: DC | PRN
Start: 2022-02-03 — End: 2022-02-03
  Administered 2022-02-03: 125 ug/kg/min via INTRAVENOUS

## 2022-02-03 MED ORDER — CLOPIDOGREL BISULFATE 75 MG PO TABS
75.0000 mg | ORAL_TABLET | Freq: Every day | ORAL | Status: DC
  Administered 2022-02-03 – 2022-02-09 (×7): 75 mg via ORAL
  Filled 2022-02-03 (×7): qty 1

## 2022-02-03 MED ORDER — ENSURE ENLIVE PO LIQD
237.0000 mL | Freq: Three times a day (TID) | ORAL | Status: DC
  Administered 2022-02-03 – 2022-02-09 (×19): 237 mL via ORAL

## 2022-02-03 MED ORDER — ADULT MULTIVITAMIN W/MINERALS CH
1.0000 | ORAL_TABLET | Freq: Every day | ORAL | Status: DC
  Administered 2022-02-03 – 2022-02-09 (×7): 1 via ORAL
  Filled 2022-02-03 (×7): qty 1

## 2022-02-03 MED ORDER — CLOPIDOGREL BISULFATE 75 MG PO TABS: 75.0000 mg | ORAL_TABLET | Freq: Every day | ORAL | Status: DC

## 2022-02-03 MED ORDER — MIRTAZAPINE 15 MG PO TABS
7.5000 mg | ORAL_TABLET | Freq: Every day | ORAL | Status: DC
  Administered 2022-02-03 – 2022-02-09 (×7): 7.5 mg via ORAL
  Filled 2022-02-03 (×8): qty 1

## 2022-02-03 MED ORDER — CLOPIDOGREL BISULFATE 75 MG PO TABS
75.0000 mg | ORAL_TABLET | Freq: Every day | ORAL | Status: DC
Start: 2022-02-03 — End: 2022-02-03

## 2022-02-03 MED ORDER — MAGNESIUM SULFATE 2 GM/50ML IV SOLN
2.0000 g | Freq: Once | INTRAVENOUS | Status: AC
  Administered 2022-02-03: 2 g via INTRAVENOUS
  Filled 2022-02-03: qty 50

## 2022-02-03 MED ORDER — PROPOFOL 10 MG/ML IV BOLUS
INTRAVENOUS | Status: DC | PRN
  Administered 2022-02-03: 20 mg via INTRAVENOUS

## 2022-02-03 MED ORDER — LIDOCAINE 2% (20 MG/ML) 5 ML SYRINGE
INTRAMUSCULAR | Status: DC | PRN
  Administered 2022-02-03: 60 mg via INTRAVENOUS

## 2022-02-03 MED ORDER — POTASSIUM CHLORIDE CRYS ER 20 MEQ PO TBCR
40.0000 meq | EXTENDED_RELEASE_TABLET | Freq: Once | ORAL | Status: AC
Start: 2022-02-03 — End: 2022-02-03
  Administered 2022-02-03: 40 meq via ORAL
  Filled 2022-02-03: qty 2

## 2022-02-03 MED ORDER — EPHEDRINE SULFATE (PRESSORS) 50 MG/ML IJ SOLN
INTRAMUSCULAR | Status: DC | PRN
  Administered 2022-02-03 (×2): 5 mg via INTRAVENOUS

## 2022-02-03 SURGICAL SUPPLY — 25 items

## 2022-02-03 NOTE — Interval H&P Note (Signed)
History and Physical Interval Note: ? ?02/03/2022 ?7:39 AM ? ?Kenneth [name]  has presented today for surgery, with the diagnosis of heme + anemia, weight loss.  The various methods of treatment have been discussed with the patient and family. After consideration of risks, benefits and other options for treatment, the patient has consented to  Procedure(s): ?ESOPHAGOGASTRODUODENOSCOPY (EGD) WITH PROPOFOL (N/A) ?COLONOSCOPY WITH PROPOFOL (N/A) as a surgical intervention.  The patient's history has been reviewed, patient examined, no change in status, stable for surgery.  I have reviewed the patient's chart and labs.  Questions were answered to the patient's satisfaction.   ? ? ?[name] ? ? ?

## 2022-02-03 NOTE — Progress Notes (Signed)
Pt CBG 327--Notified on call X. Blount,NP via secure chat. No new order received. ?

## 2022-02-03 NOTE — Progress Notes (Signed)
Initial Nutrition Assessment ? ?DOCUMENTATION CODES:  ?Severe malnutrition in context of social or environmental circumstances ? ?INTERVENTION:  ?Liberalize diet to carb modified, encourage PO intake ?Ensure Enlive po TID, each supplement provides 350 kcal and 20 grams of protein. ?MVI with minerals daily ? ?NUTRITION DIAGNOSIS:  ?Severe Malnutrition (in the context of social/environmental circumstances) related to poor appetite as evidenced by severe muscle depletion, severe fat depletion, percent weight loss (15.9% x 3 months). ? ?GOAL:  ?Patient will meet greater than or equal to 90% of their needs ? ?MONITOR:  ?PO intake, Supplement acceptance, Weight trends ? ?REASON FOR ASSESSMENT:  ?Malnutrition Screening Tool ?  ? ?ASSESSMENT:  ?61 y.o. male with history of HTN, HLD, hx CVA, DM type 2, hx melanoma with mets to lung and brain 2013, and ETOH abuse presented to ED after recent discharge for FTT at home. ? ?Pt resting in bed at the time of assessment. Sister at bedside. Pt reports appetite and energy feeling much improved today but had been poor for quite sometime. Pt reports he had been staying with his sister since his last admission. Poor appetite since that time - reports not feeling hunger. ? ?Discussed recent weight hx. Pt reports a usual weight of 204 lb. 15.9% weight loss x 3 month, which is severe (12/9-3/2). Noted GI recommending initiation of mirtazapine to help with appetite. ? ?Significant fat and muscle depletions present on exam. Discussed nutrition supplements with pt. Has been drinking premier protein at home. Agreeable to ensure. Encouraged a high kcal/high protein supplement at discharge.  ? ?3/3 - EGD/Colonoscopy ?EGD Findings - Erythema in the esophagus, Gastritis, Duodenal erosions. ?Colonoscopy Findings - Diverticulosis and internal/external hemorrhoids  ? ?Nutritionally Relevant Medications: ?Scheduled Meds: ? atorvastatin  20 mg Oral Daily  ? feeding supplement  1 Container Oral TID BM   ? feeding supplement  237 mL Oral BID BM  ? insulin aspart  0-5 Units Subcutaneous QHS  ? insulin aspart  0-9 Units Subcutaneous TID WC  ? pantoprazole  40 mg Oral BID  ? ?PRN Meds: ondansetron  ? ?Labs Reviewed: ?Sodium 133 ?Mg 1.3 ?SBG ranges from 94-187 mg/dL over the last 24 hours ?HgbA1c 6.6% (2/21) ? ?NUTRITION - FOCUSED PHYSICAL EXAM: ?Flowsheet Row Most Recent Value  ?Orbital Region Moderate depletion  ?Upper Arm Region Severe depletion  ?Thoracic and Lumbar Region Severe depletion  ?Buccal Region Moderate depletion  ?Temple Region Moderate depletion  ?Clavicle Bone Region Severe depletion  ?Clavicle and Acromion Bone Region Severe depletion  ?Scapular Bone Region Severe depletion  ?Dorsal Hand Mild depletion  ?Patellar Region Severe depletion  ?Anterior Thigh Region Severe depletion  ?Posterior Calf Region Severe depletion  ?Edema (RD Assessment) None  ?Hair Reviewed  ?Eyes Reviewed  ?Mouth Reviewed  ?Skin Reviewed  [scattered bruising, redness to bilateral knees]  ?Nails Reviewed  ? ?Diet Order:   ?Diet Order   ? ?       ?  Diet Carb Modified Fluid consistency: Thin; Room service appropriate? Yes  Diet effective now       ?  ? ?  ?  ? ?  ? ? ?EDUCATION NEEDS:  ?Education needs have been addressed ? ?Skin:  Skin Assessment: Skin Integrity Issues: ?Skin Integrity Issues:: Stage II ?Stage II: coccyx ? ?Last BM:  3/3 - type 7 ? ?Height:  ?Ht Readings from Last 1 Encounters:  ?02/02/22 5\' 4"  (1.626 m)  ? ? ?Weight:  ?Wt Readings from Last 1 Encounters:  ?02/02/22 55.1 kg  ? ? ?  Ideal Body Weight:  59.1 kg ? ?BMI:  Body mass index is 20.85 kg/m?. ? ?Estimated Nutritional Needs:  ?Kcal:  1700-1900 kcal/d ?Protein:  85-100 g/d ?Fluid:  1.8-2L/d ? ? ?Ranell Patrick, RD, LDN ?Clinical Dietitian ?RD pager # available in [place]  ?After hours/weekend pager # available in [place] ?

## 2022-02-03 NOTE — Progress Notes (Signed)
[facility] PROGRESS NOTE   Kenneth [name] XFG:[id number] DOB: 11/20/1961 DOA: 02/01/2022  2 DOS: the patient was seen and examined on 02/03/2022  PCP: Isaac Bliss, Rayford Halsted, MD  Brief History and Hospital Course:  61 y.o. 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 presented to the emergency department with significant fatigue, poor oral intake and weight loss.  Found to have occult blood on stool examination.  He was hospitalized for further management.  Seen by gastroenterology.  Underwent EGD and colonoscopy on 3/3.  Consultants: Gastroenterology  Procedures:   EGD Impression:               - Erythema in the esophagus. Biopsied.                           - Gastritis. Biopsied.                           - Duodenal erosions. Recommendation:           - Patient has a contact number available for                            emergencies. The signs and symptoms of potential                            delayed complications were discussed with the                            patient. Return to normal activities tomorrow.                            Written discharge instructions were provided to the                            patient.                           - Daily PPI single dose                           OK for clopidogrel                           Still waiting on CA19-9 to determine EUS vs MRI of                            pancreas lesion- that could be done outpatient - if                            CA 19-9 is up would do EUS (outpatient) if not up                            then most likely MRI/MRCP  Colonoscopy Impression:               -  Diverticulosis in the sigmoid colon.                           - External and internal hemorrhoids.                           -  The examination was otherwise normal on direct                            and retroflexion views.                           - No specimens collected.                           - Personal history of colonic polyps.   Subjective: Patient seen after he returned from his endoscopies.  Trying to eat some lunch.  Denies any abdominal pain nausea vomiting.  His sister is at the bedside.      Assessment/Plan:   * Failure to thrive in adult Patient was recently hospitalized for acute stroke.  He was discharged home with outpatient therapy.  Apparently at home he has not been doing well and has been nonambulatory.  Has had significant loss of appetite as well as loss of about 60 pounds of weight in the last 2 to 3 months. PT and OT evaluation is pending. Patient amenable to trying mirtazapine.  Occult GI bleeding Patient noted to have downtrending hemoglobin.  Noted to be heme positive on stool examination.  Gastroenterology was consulted.  Patient started on PPI.  No overt bleeding noted. Patient underwent EGD and colonoscopy.  Colonoscopy showed diverticulosis in the sigmoid colon along with external and internal hemorrhoids.  No specimens were collected.   EGD showed erythema in the esophagus which was biopsied.  Gastritis and duodenal erosions were also noted.   Continue PPI.  Pancreatic cyst - mass A CT scan of the chest abdomen pelvis was ordered due to his history of weight loss. This revealed findings of chronic pancreatitis along with a new cystic mass within the pancreatic head measuring up to 3.6 cm.  Pancreatic ductal dilatation noted.  Concern is for cystic pancreatic neoplasm.  GI is managing this.  CA 19-9 level has been ordered depending on which further work-up will be considered.  Urinary tract infection UA was noted to be remarkably abnormal.  Patient did mention urinary frequency.  CT scan does not show any abnormality in the GU tract.  Started on ceftriaxone.  Urine culture  is growing E. coli.  Final identification sensitivities pending.  CVA (cerebral vascular accident) [facility]) Recent stroke in February.  Was discharged home with outpatient therapy.  Supposed to be on aspirin and Plavix but has not filled his Plavix prescription yet.  Currently on hold due to concern for GI bleed.  Continue statin.  Monitor blood pressure.  PT and OT evaluation. We will determine from GI if DAPT can be resumed.  Diabetes mellitus without complication (HCC) Recent HbA1c 6.6.  Glucophage on hold.  Continue SSI.  Essential hypertension Continue amlodipine and metoprolol.  Zestoretic on hold. Blood pressure is reasonably well controlled.  Normocytic anemia Hemoglobin is stable.  Ferritin 372.  Continue to monitor  Metastatic melanoma to lung ([place]) In remission for 7 years.  Followed at [facility]  [place].  Recent MRI brain did not show any metastatic process.  Dyslipidemia Continue Lipitor     DVT Prophylaxis: SCDs Code Status: DNR Family Communication: Discussed with sister and patient Disposition Plan: To be determined  Status is: Inpatient Remains inpatient appropriate because: Concern for GI bleed, urinary tract infection, significant weight loss       Medications: Scheduled:  amLODipine  10 mg Oral Daily   aspirin EC  81 mg Oral Daily   atorvastatin  20 mg Oral Daily   feeding supplement  1 Container Oral TID BM   feeding supplement  237 mL Oral BID BM   insulin aspart  0-5 Units Subcutaneous QHS   insulin aspart  0-9 Units Subcutaneous TID WC   metoprolol tartrate  100 mg Oral BID   pantoprazole  40 mg Oral BID   sodium chloride flush  3 mL Intravenous Q12H   Continuous:  cefTRIAXone (ROCEPHIN)  IV Stopped (02/02/22 1139)   DGL:OVFIEPPIRJJOA **OR** acetaminophen, hydrALAZINE, morphine injection, ondansetron **OR** ondansetron (ZOFRAN) IV  Antibiotics: Anti-infectives (From admission, onward)    Start     Dose/Rate Route Frequency Ordered Stop    02/02/22 1000  cefTRIAXone (ROCEPHIN) 1 g in sodium chloride 0.9 % 100 mL IVPB        1 g 200 mL/hr over 30 Minutes Intravenous Every 24 hours 02/02/22 0924         Objective:  Vital Signs  Vitals:   02/03/22 0713 02/03/22 0834 02/03/22 0844 02/03/22 0851  BP: 136/68 130/63 (!) 152/63 (!) 159/62  Pulse: 71 69 67 69  Resp: 12 13 11 15   Temp: 97.8 F (36.6 C) (!) 97 F (36.1 C)    TempSrc: Temporal Temporal    SpO2:   100% 100%  Weight:      Height:        Intake/Output Summary (Last 24 hours) at 02/03/2022 1137 Last data filed at 02/03/2022 0835 Gross per 24 hour  Intake 2155.49 ml  Output 201 ml  Net 1954.49 ml   Filed Weights   02/02/22 1657  Weight: 55.1 kg    General appearance: Awake alert.  In no distress Resp: Clear to auscultation bilaterally.  Normal effort Cardio: S1-S2 is normal regular.  No S3-S4.  No rubs murmurs or bruit GI: Abdomen is soft.  Nontender nondistended.  Bowel sounds are present normal.  No masses organomegaly Extremities: No edema.  Moving all of his extremities.  Physical deconditioning noted. Neurologic: Alert and oriented x3.  No focal neurological deficits.     Lab Results:  Data Reviewed: I have personally reviewed labs and imaging study reports  CBC: Recent Labs  Lab 02/01/22 1049 02/02/22 0830 02/03/22 0131  WBC 8.6 14.0* 13.3*  NEUTROABS 6.8  --   --   HGB 11.5* 11.5* 11.3*  HCT 32.3* 33.8* 32.3*  MCV 90.0 91.8 91.0  PLT 353 337 416    Basic Metabolic Panel: Recent Labs  Lab 02/01/22 1049 02/02/22 0649 02/03/22 0131  NA 135 131* 133*  K 3.6 4.6 3.5  CL 97* 96* 103  CO2 27 23 22   GLUCOSE 148* 102* 131*  BUN 45* 27* 15  CREATININE 1.27* 1.00 0.85  CALCIUM 9.2 8.7* 8.5*  MG 1.3*  --   --     GFR: Estimated Creatinine Clearance: 72 mL/min (by C-G formula based on SCr of 0.85 mg/dL).  Liver Function Tests: Recent Labs  Lab 02/01/22 1049 02/03/22 0131  AST 12* 17  ALT 11 13  ALKPHOS 34* 36*  BILITOT  0.7 0.5  PROT 5.8* 5.8*  ALBUMIN 3.2* 2.9*    CBG: Recent Labs  Lab 02/02/22 0748 02/02/22 1144 02/02/22 1606 02/02/22 1701 02/02/22 2211  GLUCAP 107* 94 187* 171* 117*     Anemia Panel: Recent Labs    02/02/22 0649 02/02/22 0830  FERRITIN 372*  --   TIBC 203*  --   IRON 45  --   RETICCTPCT  --  1.1    Recent Results (from the past 240 hour(s))  Resp Panel by RT-PCR (Flu A&B, Covid) Nasopharyngeal Swab     Status: None   Collection Time: 01/24/22  4:21 PM   Specimen: Nasopharyngeal Swab; Nasopharyngeal(NP) swabs in vial transport medium  Result Value Ref Range Status   SARS Coronavirus 2 by RT PCR NEGATIVE NEGATIVE Final    Comment: (NOTE) SARS-CoV-2 target nucleic acids are NOT DETECTED.  The SARS-CoV-2 RNA is generally detectable in upper respiratory specimens during the acute phase of infection. The lowest concentration of SARS-CoV-2 viral copies this assay can detect is 138 copies/mL. A negative result does not preclude SARS-Cov-2 infection and should not be used as the sole basis for treatment or other patient management decisions. A negative result may occur with  improper specimen collection/handling, submission of specimen other than nasopharyngeal swab, presence of viral mutation(s) within the areas targeted by this assay, and inadequate number of viral copies(<138 copies/mL). A negative result must be combined with clinical observations, patient history, and epidemiological information. The expected result is Negative.  Fact Sheet for Patients:  EntrepreneurPulse.com.au  Fact Sheet for Healthcare Providers:  IncredibleEmployment.be  This test is no t yet approved or cleared by the [place] FDA and  has been authorized for detection and/or diagnosis of SARS-CoV-2 by FDA under an Emergency Use Authorization (EUA). This EUA will remain  in effect (meaning this test can be used) for the duration of the COVID-19  declaration under Section 564(b)(1) of the Act, 21 U.S.C.section 360bbb-3(b)(1), unless the authorization is terminated  or revoked sooner.       Influenza A by PCR NEGATIVE NEGATIVE Final   Influenza B by PCR NEGATIVE NEGATIVE Final    Comment: (NOTE) The Xpert Xpress SARS-CoV-2/FLU/RSV plus assay is intended as an aid in the diagnosis of influenza from Nasopharyngeal swab specimens and should not be used as a sole basis for treatment. Nasal washings and aspirates are unacceptable for Xpert Xpress SARS-CoV-2/FLU/RSV testing.  Fact Sheet for Patients: EntrepreneurPulse.com.au  Fact Sheet for Healthcare Providers: IncredibleEmployment.be  This test is not yet approved or cleared by the [place] FDA and has been authorized for detection and/or diagnosis of SARS-CoV-2 by FDA under an Emergency Use Authorization (EUA). This EUA will remain in effect (meaning this test can be used) for the duration of the COVID-19 declaration under Section 564(b)(1) of the Act, 21 U.S.C. section 360bbb-3(b)(1), unless the authorization is terminated or revoked.  Performed at [facility], [place] [address] [place], [place] [address]   Urine Culture     Status: Abnormal (Preliminary result)   Collection Time: 02/01/22  4:26 PM   Specimen: Urine, Clean Catch  Result Value Ref Range Status   Specimen Description URINE, CLEAN CATCH  Final   Special Requests NONE  Final   Culture (A)  Final    >=100,000 COLONIES/mL ESCHERICHIA COLI SUSCEPTIBILITIES TO FOLLOW Performed at [facility], [place] [address] [place], [place] [address]    Report Status PENDING  Incomplete  Resp Panel by RT-PCR (Flu A&B, Covid) Nasopharyngeal Swab     Status: None   Collection Time: 02/01/22  7:41 PM   Specimen: Nasopharyngeal Swab; Nasopharyngeal(NP) swabs in vial transport medium  Result Value Ref Range Status   SARS Coronavirus 2 by RT PCR NEGATIVE NEGATIVE Final     Comment: (NOTE) SARS-CoV-2 target nucleic acids are NOT DETECTED.  The SARS-CoV-2 RNA is generally detectable in upper respiratory specimens during the acute phase of infection. The lowest concentration of SARS-CoV-2 viral copies this assay can detect is 138 copies/mL. A negative result does not preclude SARS-Cov-2 infection and should not be used as the sole basis for treatment or other patient management decisions. A negative result may occur with  improper specimen collection/handling, submission of specimen other than nasopharyngeal swab, presence of viral mutation(s) within the areas targeted by this assay, and inadequate number of viral copies(<138 copies/mL). A negative result must be combined with clinical observations, patient history, and epidemiological information. The expected result is Negative.  Fact Sheet for Patients:  EntrepreneurPulse.com.au  Fact Sheet for Healthcare Providers:  IncredibleEmployment.be  This test is no t yet approved or cleared by the [place] FDA and  has been authorized for detection and/or diagnosis of SARS-CoV-2 by FDA under an Emergency Use Authorization (EUA). This EUA will remain  in effect (meaning this test can be used) for the duration of the COVID-19 declaration under Section 564(b)(1) of the Act, 21 U.S.C.section 360bbb-3(b)(1), unless the authorization is terminated  or revoked sooner.       Influenza A by PCR NEGATIVE NEGATIVE Final   Influenza B by PCR NEGATIVE NEGATIVE Final    Comment: (NOTE) The Xpert Xpress SARS-CoV-2/FLU/RSV plus assay is intended as an aid in the diagnosis of influenza from Nasopharyngeal swab specimens and should not be used as a sole basis for treatment. Nasal washings and aspirates are unacceptable for Xpert Xpress SARS-CoV-2/FLU/RSV testing.  Fact Sheet for Patients: EntrepreneurPulse.com.au  Fact Sheet for Healthcare  Providers: IncredibleEmployment.be  This test is not yet approved or cleared by the [place] FDA and has been authorized for detection and/or diagnosis of SARS-CoV-2 by FDA under an Emergency Use Authorization (EUA). This EUA will remain in effect (meaning this test can be used) for the duration of the COVID-19 declaration under Section 564(b)(1) of the Act, 21 U.S.C. section 360bbb-3(b)(1), unless the authorization is terminated or revoked.  Performed at [facility], [place] [address], , [place] [address]       Radiology Studies: CT CHEST ABDOMEN PELVIS W CONTRAST  Result Date: 02/02/2022 CLINICAL DATA:  Unintended weight loss.  Melanoma EXAM: CT CHEST, ABDOMEN, AND PELVIS WITH CONTRAST TECHNIQUE: Multidetector CT imaging of the chest, abdomen and pelvis was performed following the standard protocol during bolus administration of intravenous contrast. RADIATION DOSE REDUCTION: This exam was performed according to the departmental dose-optimization program which includes automated exposure control, adjustment of the mA and/or kV according to patient size and/or use of iterative reconstruction technique. CONTRAST:  128mL OMNIPAQUE IOHEXOL 350 MG/ML SOLN COMPARISON:  11/10/2019 FINDINGS: CT CHEST FINDINGS Cardiovascular: Heart is normal size. Scattered coronary artery and aortic calcifications. No aneurysm. Retroesophageal right subclavian artery noted. Mediastinum/Nodes: No mediastinal, hilar, or axillary adenopathy. Trachea and esophagus are unremarkable. Thyroid unremarkable. Small hiatal hernia. Lungs/Pleura: Lungs are clear. No focal airspace opacities or suspicious nodules. No effusions. Musculoskeletal: Chest wall soft tissues are unremarkable. No acute bony abnormality. Old healed fractures bilaterally. CT ABDOMEN PELVIS FINDINGS Hepatobiliary: No focal  hepatic abnormality. Gallbladder unremarkable. Pancreas: No focal hepatic abnormality. Gallbladder  unremarkable. Calcifications in the pancreatic head and body compatible with chronic pancreatitis. Mixed density predominantly low-density mass noted in the pancreatic head/uncinate process measuring 3.6 x 3.4 cm. There is pancreatic ductal dilatation. Pancreatic duct measures up to 7 mm in the pancreatic head/body. Spleen: No focal abnormality.  Normal size. Adrenals/Urinary Tract: No adrenal abnormality. No focal renal abnormality. No stones or hydronephrosis. Urinary bladder is unremarkable. Stomach/Bowel: Stomach, large and small bowel grossly unremarkable. Vascular/Lymphatic: Aortic atherosclerosis. No evidence of aneurysm or adenopathy. Reproductive: Mildly prominent prostate Other: No free fluid or free air. Musculoskeletal: No acute bony abnormality. IMPRESSION: No acute cardiopulmonary disease. Coronary artery disease, aortic atherosclerosis. Small hiatal hernia. Changes of chronic pancreatitis. New cystic mass within the pancreatic head/uncinate process measuring up to 3.6 cm. Pancreatic ductal dilatation noted. Findings concerning for possible cystic pancreatic neoplasm. When the patient is clinically stable and able to follow directions and hold their breath (preferably as an outpatient) further evaluation with dedicated abdominal MRI should be considered. Electronically Signed   By: Rolm Baptise M.D.   On: 02/02/2022 00:10       LOS: 2 days   [facility] Pager on [email or web address]  02/03/2022, 11:37 AM

## 2022-02-03 NOTE — Progress Notes (Addendum)
Patient back on the unit. Diet advanced to Heart Healthy. Menu given and phone at reach to place order. Denies pain at this time. Call bell within reach. ?

## 2022-02-03 NOTE — Transfer of Care (Signed)
Immediate Anesthesia Transfer of Care Note ? ?Patient: Kenneth [name] ? ?Procedure(s) Performed: ESOPHAGOGASTRODUODENOSCOPY (EGD) WITH PROPOFOL ?COLONOSCOPY WITH PROPOFOL ?BIOPSY ? ?Patient Location: Endoscopy Unit ? ?Anesthesia Type:MAC ? ?Level of Consciousness: drowsy, patient cooperative and responds to stimulation ? ?Airway & Oxygen Therapy: Patient Spontanous Breathing and Patient connected to nasal cannula oxygen ? ?Post-op Assessment: Report given to RN and Post -op Vital signs reviewed and stable ? ?Post vital signs: Reviewed and stable ? ?Last Vitals:  ?Vitals Value Taken Time  ?BP 130/63 02/03/22 0834  ?Temp 36.1 ?C 02/03/22 0834  ?Pulse 69 02/03/22 0834  ?Resp 13 02/03/22 0835  ?SpO2    ?Vitals shown include unvalidated device data. ? ?Last Pain:  ?Vitals:  ? 02/03/22 0834  ?TempSrc: Temporal  ?PainSc:   ?   ? ?  ? ?Complications: No notable events documented. ?

## 2022-02-03 NOTE — Op Note (Signed)
[facility] ?Patient Name: Kenneth [name] ?Procedure Date : 02/03/2022 ?MRN: [id number] ?Attending MD: Gatha Mayer , MD ?Date of Birth: 08-Mar-1961 ?CSN: [id number] ?Age: 61 ?Admit Type: Inpatient ?Procedure:                Upper GI endoscopy ?Indications:              Heme positive stool, Weight loss ?Providers:                Gatha Mayer, MD, Allayne Gitelman, RN, Frazier Richards,  ?                          Technician ?Referring MD:              ?Medicines:                Monitored Anesthesia Care ?Complications:            No immediate complications. ?Estimated Blood Loss:     Estimated blood loss was minimal. Estimated blood  ?                          loss was minimal. ?Procedure:                Pre-Anesthesia Assessment: ?                          - Prior to the procedure, a History and Physical  ?                          was performed, and patient medications and  ?                          allergies were reviewed. The patient's tolerance of  ?                          previous anesthesia was also reviewed. The risks  ?                          and benefits of the procedure and the sedation  ?                          options and risks were discussed with the patient.  ?                          All questions were answered, and informed consent  ?                          was obtained. Prior Anticoagulants: The patient has  ?                          taken no previous anticoagulant or antiplatelet  ?                          agents. ASA Grade Assessment: III - A patient with  ?  severe systemic disease. After reviewing the risks  ?                          and benefits, the patient was deemed in  ?                          satisfactory condition to undergo the procedure. ?                          After obtaining informed consent, the endoscope was  ?                          passed under direct vision. Throughout the  ?                          procedure, the patient's blood  pressure, pulse, and  ?                          oxygen saturations were monitored continuously. The  ?                          [id number]-[id number] ([id number]) Olympus endoscope was introduced  ?                          through the mouth, and advanced to the second part  ?                          of duodenum. The upper GI endoscopy was  ?                          accomplished without difficulty. The patient  ?                          tolerated the procedure well. ?Scope In: ?Scope Out: ?Findings: ?     Patchy mild erythema was found in the entire esophagus. Biopsies were  ?     taken with a cold forceps for histology. Verification of patient  ?     identification for the specimen was done. Estimated blood loss was  ?     minimal. ?     Localized inflammation characterized by erosions was found in the  ?     prepyloric region of the stomach. Biopsies were taken with a cold  ?     forceps for histology. Verification of patient identification for the  ?     specimen was done. Estimated blood loss was minimal. ?     A few localized erosions were found in the duodenal bulb. ?     The gastroesophageal flap valve was visualized endoscopically and  ?     classified as [place] Grade II (fold present, opens with respiration). ?     A small hiatal hernia was present. ?     The exam was otherwise without abnormality. ?     The cardia and gastric fundus were normal on retroflexion. ?Impression:               - Erythema in the esophagus. Biopsied. ?                          -  Gastritis. Biopsied. ?                          - Duodenal erosions. ?Recommendation:           - Patient has a contact number available for  ?                          emergencies. The signs and symptoms of potential  ?                          delayed complications were discussed with the  ?                          patient. Return to normal activities tomorrow.  ?                          Written discharge instructions were provided to the  ?                           patient. ?                          - Daily PPI single dose ?                          OK for clopidogrel ?                          Still waiting on CA19-9 to determine EUS vs MRI of  ?                          pancreas lesion- that could be done outpatient - if  ?                          CA 19-9 is up would do EUS (outpatient) if not up  ?                          then most likely MRI/MRCP ?                          I will f/u path and coordinate this ?                          would consider mirtazapine to treat vegetative sxs  ?                          depression ?Procedure Code(s):        --- Professional --- ?                          [phone], Esophagogastroduodenoscopy, flexible,  ?                          transoral; with biopsy, single or multiple ?Diagnosis Code(s):        --- Professional --- ?  K22.8, Other specified diseases of esophagus ?                          K29.70, Gastritis, unspecified, without bleeding ?                          K26.9, Duodenal ulcer, unspecified as acute or  ?                          chronic, without hemorrhage or perforation ?                          R19.5, Other fecal abnormalities ?                          R63.4, Abnormal weight loss ?CPT copyright 2019 [employer]. All rights reserved. ?The codes documented in this report are preliminary and upon coder review may  ?be revised to meet current compliance requirements. ?Gatha Mayer, MD ?02/03/2022 8:42:43 AM ?This report has been signed electronically. ?Number of Addenda: 0 ?

## 2022-02-03 NOTE — Op Note (Signed)
[facility] ?Patient Name: Kenneth [name] ?Procedure Date : 02/03/2022 ?MRN: [id number] ?Attending MD: Gatha Mayer , MD ?Date of Birth: [birth year] ?CSN: [id number] ?Age: 61 ?Admit Type: Inpatient ?Procedure:                Colonoscopy ?Indications:              Heme positive stool, Weight loss ?Providers:                Gatha Mayer, MD, Allayne Gitelman, RN, Frazier Richards,  ?                          Technician ?Referring MD:              ?Medicines:                Monitored Anesthesia Care ?Complications:            No immediate complications. ?Estimated Blood Loss:     Estimated blood loss: none. ?Procedure:                Pre-Anesthesia Assessment: ?                          - Prior to the procedure, a History and Physical  ?                          was performed, and patient medications and  ?                          allergies were reviewed. The patient's tolerance of  ?                          previous anesthesia was also reviewed. The risks  ?                          and benefits of the procedure and the sedation  ?                          options and risks were discussed with the patient.  ?                          All questions were answered, and informed consent  ?                          was obtained. Prior Anticoagulants: The patient has  ?                          taken no previous anticoagulant or antiplatelet  ?                          agents. ASA Grade Assessment: III - A patient with  ?                          severe systemic disease. After reviewing the risks  ?  and benefits, the patient was deemed in  ?                          satisfactory condition to undergo the procedure. ?                          After obtaining informed consent, the colonoscope  ?                          was passed under direct vision. Throughout the  ?                          procedure, the patient's blood pressure, pulse, and  ?                          oxygen saturations were  monitored continuously. The  ?                          CF-HQ190L ([id number]) Olympus coloscope was  ?                          introduced through the anus and advanced to the the  ?                          cecum, identified by appendiceal orifice and  ?                          ileocecal valve. The colonoscopy was performed  ?                          without difficulty. The patient tolerated the  ?                          procedure well. The quality of the bowel  ?                          preparation was good. The ileocecal valve,  ?                          appendiceal orifice, and rectum were photographed. ?Scope In: 8:03:54 AM ?Scope Out: 8:21:55 AM ?Scope Withdrawal Time: 0 hours 14 minutes 38 seconds  ?Total Procedure Duration: 0 hours 18 minutes 1 second  ?Findings: ?     The perianal and digital rectal examinations were normal. Pertinent  ?     negatives include normal prostate (size, shape, and consistency). ?     Multiple diverticula were found in the sigmoid colon. ?     External and internal hemorrhoids were found. ?     The exam was otherwise without abnormality on direct and retroflexion  ?     views. ?Impression:               - Diverticulosis in the sigmoid colon. ?                          - External and internal hemorrhoids. ?                          -  The examination was otherwise normal on direct  ?                          and retroflexion views. ?                          - No specimens collected. ?                          - Personal history of colonic polyps. ?Recommendation:           - Return patient to hospital ward for ongoing care. ?                          - See the other procedure note for documentation of  ?                          additional recommendations. ?                          - Routine repeat colonoscopy 10 years (had  ?                          diminutive adenoma 2018 and no polyps today so 10  ?                          years next routine exam) - Dr. Loletha Carrow ?Procedure  Code(s):        --- Professional --- ?                          [phone], Colonoscopy, flexible; diagnostic, including  ?                          collection of specimen(s) by brushing or washing,  ?                          when performed (separate procedure) ?Diagnosis Code(s):        --- Professional --- ?                          J18.8, Other hemorrhoids ?                          Z86.010, Personal history of colonic polyps ?                          R19.5, Other fecal abnormalities ?                          R63.4, Abnormal weight loss ?                          K57.30, Diverticulosis of large intestine without  ?                          perforation or abscess without bleeding ?CPT copyright 2019 American Medical Association. All rights  reserved. ?The codes documented in this report are preliminary and upon coder review may  ?be revised to meet current compliance requirements. ?Gatha Mayer, MD ?02/03/2022 8:45:59 AM ?This report has been signed electronically. ?Number of Addenda: 0 ?

## 2022-02-03 NOTE — Anesthesia Postprocedure Evaluation (Signed)
Anesthesia Post Note ? ?Patient: Kenneth [name] ? ?Procedure(s) Performed: ESOPHAGOGASTRODUODENOSCOPY (EGD) WITH PROPOFOL ?COLONOSCOPY WITH PROPOFOL ?BIOPSY ? ?  ? ?Patient location during evaluation: PACU ?Anesthesia Type: MAC ?Level of consciousness: awake and alert ?Pain management: pain level controlled ?Vital Signs Assessment: post-procedure vital signs reviewed and stable ?Respiratory status: spontaneous breathing, nonlabored ventilation and respiratory function stable ?Cardiovascular status: stable and blood pressure returned to baseline ?Postop Assessment: no apparent nausea or vomiting ?Anesthetic complications: no ? ? ?No notable events documented. ? ?Last Vitals:  ?Vitals:  ? 02/03/22 0844 02/03/22 0851  ?BP: (!) 152/63 (!) 159/62  ?Pulse: 67 69  ?Resp: 11 15  ?Temp:    ?SpO2: 100% 100%  ?  ?Last Pain:  ?Vitals:  ? 02/03/22 0851  ?TempSrc:   ?PainSc: 0-No pain  ? ? ?  ?  ?  ?  ?  ?  ? ?Meshach Perry,W. EDMOND ? ? ? ? ?

## 2022-02-04 LAB — GLUCOSE, CAPILLARY
Glucose-Capillary: 143 mg/dL — ABNORMAL HIGH (ref 70–99)
Glucose-Capillary: 134 mg/dL — ABNORMAL HIGH (ref 70–99)
Glucose-Capillary: 229 mg/dL — ABNORMAL HIGH (ref 70–99)
Glucose-Capillary: 160 mg/dL — ABNORMAL HIGH (ref 70–99)

## 2022-02-04 LAB — CBC
HCT: 32.1 % — ABNORMAL LOW (ref 39.0–52.0)
Hemoglobin: 11 g/dL — ABNORMAL LOW (ref 13.0–17.0)
MCH: 31.2 pg (ref 26.0–34.0)
MCHC: 34.3 g/dL (ref 30.0–36.0)
MCV: 90.9 fL (ref 80.0–100.0)
Platelets: 348 10*3/uL (ref 150–400)
RBC: 3.53 MIL/uL — ABNORMAL LOW (ref 4.22–5.81)
RDW: 12.2 % (ref 11.5–15.5)
WBC: 7.2 10*3/uL (ref 4.0–10.5)
nRBC: 0 % (ref 0.0–0.2)

## 2022-02-04 LAB — COMPREHENSIVE METABOLIC PANEL
ALT: 11 U/L (ref 0–44)
AST: 13 U/L — ABNORMAL LOW (ref 15–41)
Albumin: 2.7 g/dL — ABNORMAL LOW (ref 3.5–5.0)
Alkaline Phosphatase: 38 U/L (ref 38–126)
Anion gap: 7 (ref 5–15)
BUN: 10 mg/dL (ref 6–20)
CO2: 23 mmol/L (ref 22–32)
Calcium: 8.7 mg/dL — ABNORMAL LOW (ref 8.9–10.3)
Chloride: 108 mmol/L (ref 98–111)
Creatinine, Ser: 0.96 mg/dL (ref 0.61–1.24)
GFR, Estimated: >60 mL/min (ref >60)
Glucose, Bld: 177 mg/dL — ABNORMAL HIGH (ref 70–99)
Potassium: 3.7 mmol/L (ref 3.5–5.1)
Sodium: 138 mmol/L (ref 135–145)
Total Bilirubin: 0.3 mg/dL (ref 0.3–1.2)
Total Protein: 5.6 g/dL — ABNORMAL LOW (ref 6.5–8.1)

## 2022-02-04 LAB — PHOSPHORUS: Phosphorus: 2.8 mg/dL (ref 2.5–4.6)

## 2022-02-04 LAB — MAGNESIUM: Magnesium: 1.6 mg/dL — ABNORMAL LOW (ref 1.7–2.4)

## 2022-02-04 LAB — URINE CULTURE: Culture: >=100000 — AB

## 2022-02-04 MED ORDER — MAGNESIUM SULFATE 2 GM/50ML IV SOLN
2.0000 g | Freq: Once | INTRAVENOUS | Status: AC
  Administered 2022-02-04: 2 g via INTRAVENOUS
  Filled 2022-02-04: qty 50

## 2022-02-04 MED ORDER — CEPHALEXIN 500 MG PO CAPS
500.0000 mg | ORAL_CAPSULE | Freq: Three times a day (TID) | ORAL | Status: AC
  Administered 2022-02-04 – 2022-02-09 (×15): 500 mg via ORAL
  Filled 2022-02-04 (×15): qty 1

## 2022-02-04 MED ORDER — POTASSIUM CHLORIDE CRYS ER 20 MEQ PO TBCR
40.0000 meq | EXTENDED_RELEASE_TABLET | Freq: Once | ORAL | Status: AC
  Administered 2022-02-04: 40 meq via ORAL
  Filled 2022-02-04: qty 2

## 2022-02-04 NOTE — NC FL2 (Signed)
?[place] MEDICAID FL2 LEVEL OF CARE SCREENING TOOL  ?  ? ?IDENTIFICATION  ?Patient Name: ?Kenneth [name] Birthdate: 1961/01/29 Sex: male Admission Date (Current Location): ?02/01/2022  ?[place] and [place] Number: ? [place] ?  Facility and Address:  ?[facility] [place] [address], [place], [place] [address] ?     Provider Number: ?[id number]  ?Attending Physician Name and Address:  ?Bonnielee Haff, MD ? Relative Name and Phone Number:  ?[name] [phone] ?   ?Current Level of Care: ?Hospital Recommended Level of Care: ?[place] Prior Approval Number: ?  ? ?Date Approved/Denied: ?  PASRR Number: ?[id number] A ? ?Discharge Plan: ?SNF ?  ? ?Current Diagnoses: ?Patient Active Problem List  ? Diagnosis Date Noted  ? Pressure injury of skin 02/03/2022  ? Protein-calorie malnutrition, severe 02/03/2022  ? Gastritis and gastroduodenitis   ? Pancreatic cyst - mass 02/02/2022  ? Urinary tract infection 02/02/2022  ? Normocytic anemia 02/02/2022  ? Weight loss   ? Failure to thrive in adult 02/01/2022  ? Cancer ([place]) 02/01/2022  ? Occult GI bleeding 02/01/2022  ? DNR (do not resuscitate) 02/01/2022  ? CVA (cerebral vascular accident) ([place]) 01/26/2022  ? Acute CVA (cerebrovascular accident) ([place]) 01/25/2022  ? Dizziness 01/24/2022  ? ARF (acute renal failure) ([place]) 01/24/2022  ? Hyponatremia 01/24/2022  ? Elevated PSA 11/24/2021  ? Hypokalemia 11/24/2021  ? Morbid obesity ([place]) 11/06/2018  ? Hx of colonic polyps 03/28/2017  ? Metastatic melanoma to lung ([place]) 12/12/2012  ? Diabetes mellitus without complication ([place]) 02/22/2247  ? Dyslipidemia 08/20/2007  ? Essential hypertension 08/19/2007  ? ? ?Orientation RESPIRATION BLADDER Height & Weight   ?  ?Self, Time, Situation, Place ? Normal Continent Weight: 121 lb 7.6 oz (55.1 kg) ?Height:  '5\' 4"'$  (162.6 cm)  ?BEHAVIORAL SYMPTOMS/MOOD NEUROLOGICAL BOWEL NUTRITION STATUS  ?    Incontinent Diet (See dc summary)  ?AMBULATORY STATUS  COMMUNICATION OF NEEDS Skin   ?Limited Assist Verbally  (Pressure Injury Coccyx Stage 2, partial thickness) ?  ?  ?  ?    ?     ?     ? ? ?Personal Care Assistance Level of Assistance  ?Bathing, Feeding, Dressing Bathing Assistance: Limited assistance ?Feeding assistance: Independent ?Dressing Assistance: Limited assistance ?   ? ?Functional Limitations Info  ?Sight, Hearing, Speech Sight Info: Adequate ?Hearing Info: Adequate ?Speech Info: Adequate  ? ? ?SPECIAL CARE FACTORS FREQUENCY  ?PT (By licensed PT), OT (By licensed OT)   ?  ?PT Frequency: 5x week ?OT Frequency: 5x week ?  ?  ?  ?   ? ? ?Contractures Contractures Info: Not present  ? ? ?Additional Factors Info  ?Code Status, Allergies, Insulin Sliding Scale Code Status Info: DNR ?Allergies Info: Cat hair extract ?  ?Insulin Sliding Scale Info: insulin aspart (novoLOG) injection 0-5 Units at bedtime, insulin aspart (novoLOG) injection 0-9 Units 3X daily ?  ?   ? ?Current Medications (02/04/2022):  This is the current hospital active medication list ?Current Facility-Administered Medications  ?Medication Dose Route Frequency Provider Last Rate Last Admin  ? acetaminophen (TYLENOL) tablet 650 mg  650 mg Oral Q6H PRN Karmen Bongo, MD      ? Or  ? acetaminophen (TYLENOL) suppository 650 mg  650 mg Rectal Q6H PRN Karmen Bongo, MD      ? amLODipine (NORVASC) tablet 10 mg  10 mg Oral Daily Karmen Bongo, MD   10 mg at 02/04/22 1034  ? aspirin EC tablet 81 mg  81 mg Oral Daily Karmen Bongo, MD   81 mg at 02/04/22 1034  ? atorvastatin (LIPITOR) tablet 20 mg  20 mg Oral Daily Karmen Bongo, MD   20 mg at 02/04/22 1034  ? cephALEXin (KEFLEX) capsule 500 mg  500 mg Oral Q8H Bonnielee Haff, MD      ? clopidogrel (PLAVIX) tablet 75 mg  75 mg Oral Daily Bonnielee Haff, MD   75 mg at 02/04/22 1034  ? feeding supplement (ENSURE ENLIVE / ENSURE PLUS) liquid 237 mL  237 mL Oral TID BM Bonnielee Haff, MD   237 mL at 02/04/22 1036  ? hydrALAZINE (APRESOLINE)  injection 5 mg  5 mg Intravenous Q4H PRN Karmen Bongo, MD      ? insulin aspart (novoLOG) injection 0-5 Units  0-5 Units Subcutaneous QHS Karmen Bongo, MD   4 Units at 02/03/22 2103  ? insulin aspart (novoLOG) injection 0-9 Units  0-9 Units Subcutaneous TID WC Karmen Bongo, MD   1 Units at 02/04/22 [phone]  ? metoprolol tartrate (LOPRESSOR) tablet 100 mg  100 mg Oral BID Karmen Bongo, MD   100 mg at 02/04/22 1034  ? mirtazapine (REMERON) tablet 7.5 mg  7.5 mg Oral QHS Bonnielee Haff, MD   7.5 mg at 02/03/22 2101  ? morphine (PF) 2 MG/ML injection 2 mg  2 mg Intravenous Q2H PRN Karmen Bongo, MD      ? multivitamin with minerals tablet 1 tablet  1 tablet Oral Daily Bonnielee Haff, MD   1 tablet at 02/04/22 1034  ? ondansetron (ZOFRAN) tablet 4 mg  4 mg Oral Q6H PRN Karmen Bongo, MD      ? Or  ? ondansetron [facility]) injection 4 mg  4 mg Intravenous Q6H PRN Karmen Bongo, MD      ? pantoprazole (PROTONIX) EC tablet 40 mg  40 mg Oral BID Karmen Bongo, MD   40 mg at 02/04/22 1034  ? sodium chloride flush (NS) 0.9 % injection 3 mL  3 mL Intravenous Q12H Karmen Bongo, MD   3 mL at 02/04/22 1035  ? ? ? ?Discharge Medications: ?Please see discharge summary for a list of discharge medications. ? ?Relevant Imaging Results: ? ?Relevant Lab Results: ? ? ?Additional Information ?SSN [id number] ? ?Deunta Beneke B Daly Whipkey, LCSWA ? ? ? ? ?

## 2022-02-04 NOTE — Progress Notes (Signed)
[facility] PROGRESS NOTE   Kenneth [name] KGY:[id number] DOB: Jan 30, 1961 DOA: 02/01/2022  3 DOS: the patient was seen and examined on 02/04/2022  PCP: Isaac Bliss, Rayford Halsted, MD  Brief History and Hospital Course:  61 y.o. 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 presented to the emergency department with significant fatigue, poor oral intake and weight loss.  Found to have occult blood on stool examination.  He was hospitalized for further management.  Seen by gastroenterology.  Underwent EGD and colonoscopy on 3/3.  Consultants: Gastroenterology  Procedures:   EGD Impression:               - Erythema in the esophagus. Biopsied.                           - Gastritis. Biopsied.                           - Duodenal erosions. Recommendation:           - Patient has a contact number available for                            emergencies. The signs and symptoms of potential                            delayed complications were discussed with the                            patient. Return to normal activities tomorrow.                            Written discharge instructions were provided to the                            patient.                           - Daily PPI single dose                           OK for clopidogrel                           Still waiting on CA19-9 to determine EUS vs MRI of                            pancreas lesion- that could be done outpatient - if                            CA 19-9 is up would do EUS (outpatient) if not up                            then most likely MRI/MRCP  Colonoscopy Impression:               -  Diverticulosis in the sigmoid colon.                           - External and internal hemorrhoids.                           -  The examination was otherwise normal on direct                            and retroflexion views.                           - No specimens collected.                           - Personal history of colonic polyps.   Subjective: Patient denies any complaints this morning.  No nausea vomiting abdominal pain.  No chest pain or shortness of breath.      Assessment/Plan:   * Failure to thrive in adult Patient was recently hospitalized for acute stroke.  He was discharged home with outpatient therapy.  Apparently at home he has not been doing well and has been nonambulatory.  Has had significant loss of appetite as well as loss of about 60 pounds of weight in the last 2 to 3 months. Started on mirtazapine yesterday. Seen by physical therapy and they recommend short-term rehab.  Occult GI bleeding Patient noted to have downtrending hemoglobin.  Noted to be heme positive on stool examination.  Gastroenterology was consulted.  Patient started on PPI.  No overt bleeding noted. Patient underwent EGD and colonoscopy.  Colonoscopy showed diverticulosis in the sigmoid colon along with external and internal hemorrhoids.  No specimens were collected.   EGD showed erythema in the esophagus which was biopsied.  Gastritis and duodenal erosions were also noted.   Continue PPI. Hemoglobin is stable.  Pancreatic cyst - mass A CT scan of the chest abdomen pelvis was ordered due to his history of weight loss. This revealed findings of chronic pancreatitis along with a new cystic mass within the pancreatic head measuring up to 3.6 cm.  Pancreatic ductal dilatation noted.  Concern is for cystic pancreatic neoplasm.  GI is managing this.  CA 19-9 level was noted to be normal.  Based on GI notes the plan would be to do MRI/MRCP in the outpatient setting.  GI to arrange this.  Urinary tract infection UA was noted to be remarkably abnormal.  Patient did mention urinary frequency.  CT scan does not show any  abnormality in the GU tract.  Started on ceftriaxone.  Urine culture is growing E. coli.  Noted to be pansensitive.  Will change to cephalexin.  CVA (cerebral vascular accident) [facility]) Recent stroke in February.  Was discharged home with outpatient therapy.  Supposed to be on aspirin and Plavix but had not filled his Plavix prescription yet.   GI cleared for the patient to resume Plavix which was resumed yesterday.  Continue aspirin Plavix for 3 weeks followed by aspirin alone.    Diabetes mellitus without complication (HCC) Recent HbA1c 6.6.  Glucophage on hold.  Continue SSI.  Essential hypertension Continue amlodipine and metoprolol.  Zestoretic on hold. Blood pressure is reasonably well controlled. Will supplement magnesium which is 1.6 today.  Will also give a dose of potassium.  Phosphorus  is normal at 2.8  Normocytic anemia Hemoglobin is stable.  Ferritin 372.  Continue to monitor  Metastatic melanoma to lung ([place]) In remission for 7 years.  Followed at [facility].  Recent MRI brain did not show any metastatic process.  Dyslipidemia Continue Lipitor     DVT Prophylaxis: SCDs Code Status: DNR Family Communication: Discussed with patient.  Discussed with the sister yesterday. Disposition Plan: Patient cannot return home since he does not have running water or electricity.  He lives by himself.  Seen by physical therapy and they recommend short-term rehab in a skilled nursing facility.  TOC is to be consulted.  Status is: Inpatient Remains inpatient appropriate because: Concern for GI bleed, urinary tract infection, significant weight loss       Medications: Scheduled:  amLODipine  10 mg Oral Daily   aspirin EC  81 mg Oral Daily   atorvastatin  20 mg Oral Daily   clopidogrel  75 mg Oral Daily   feeding supplement  237 mL Oral TID BM   insulin aspart  0-5 Units Subcutaneous QHS   insulin aspart  0-9 Units Subcutaneous TID WC   metoprolol tartrate  100 mg Oral BID    mirtazapine  7.5 mg Oral QHS   multivitamin with minerals  1 tablet Oral Daily   pantoprazole  40 mg Oral BID   sodium chloride flush  3 mL Intravenous Q12H   Continuous:  cefTRIAXone (ROCEPHIN)  IV 1 g (02/03/22 1238)   UXN:ATFTDDUKGURKY **OR** acetaminophen, hydrALAZINE, morphine injection, ondansetron **OR** ondansetron (ZOFRAN) IV  Antibiotics: Anti-infectives (From admission, onward)    Start     Dose/Rate Route Frequency Ordered Stop   02/02/22 1000  cefTRIAXone (ROCEPHIN) 1 g in sodium chloride 0.9 % 100 mL IVPB        1 g 200 mL/hr over 30 Minutes Intravenous Every 24 hours 02/02/22 0924         Objective:  Vital Signs  Vitals:   02/04/22 0001 02/04/22 0500 02/04/22 0830 02/04/22 0932  BP: (!) 146/71 139/76 (!) 150/90   Pulse: 77 82 70 94  Resp: '16 16 18   '$ Temp: 98 F (36.7 C) 99.6 F (37.6 C) 98.3 F (36.8 C)   TempSrc:  Oral Oral   SpO2: 100% 100% 96% 95%  Weight:      Height:        Intake/Output Summary (Last 24 hours) at 02/04/2022 1032 Last data filed at 02/04/2022 0500 Gross per 24 hour  Intake 339.29 ml  Output 660 ml  Net -320.71 ml    Filed Weights   02/02/22 1657  Weight: 55.1 kg    General appearance: Awake alert.  In no distress Resp: Clear to auscultation bilaterally.  Normal effort Cardio: S1-S2 is normal regular.  No S3-S4.  No rubs murmurs or bruit GI: Abdomen is soft.  Nontender nondistended.  Bowel sounds are present normal.  No masses organomegaly Extremities: No edema.  Able to move all of his extremity Neurologic: Alert and oriented x3.  No focal neurological deficits.      Lab Results:  Data Reviewed: I have personally reviewed labs and imaging study reports  CBC: Recent Labs  Lab 02/01/22 1049 02/02/22 0830 02/03/22 0131 02/04/22 0114  WBC 8.6 14.0* 13.3* 7.2  NEUTROABS 6.8  --   --   --   HGB 11.5* 11.5* 11.3* 11.0*  HCT 32.3* 33.8* 32.3* 32.1*  MCV 90.0 91.8 91.0 90.9  PLT 353 337 336 348  Basic  Metabolic Panel: Recent Labs  Lab 02/01/22 1049 02/02/22 0649 02/03/22 0131 02/04/22 0114 02/04/22 0717  NA 135 131* 133* 138  --   K 3.6 4.6 3.5 3.7  --   CL 97* 96* 103 108  --   CO2 '27 23 22 23  '$ --   GLUCOSE 148* 102* 131* 177*  --   BUN 45* 27* 15 10  --   CREATININE 1.27* 1.00 0.85 0.96  --   CALCIUM 9.2 8.7* 8.5* 8.7*  --   MG 1.3*  --   --  1.6*  --   PHOS  --   --   --   --  2.8     GFR: Estimated Creatinine Clearance: 63.8 mL/min (by C-G formula based on SCr of 0.96 mg/dL).  Liver Function Tests: Recent Labs  Lab 02/01/22 1049 02/03/22 0131 02/04/22 0114  AST 12* 17 13*  ALT '11 13 11  '$ ALKPHOS 34* 36* 38  BILITOT 0.7 0.5 0.3  PROT 5.8* 5.8* 5.6*  ALBUMIN 3.2* 2.9* 2.7*     CBG: Recent Labs  Lab 02/02/22 1606 02/02/22 1701 02/02/22 2211 02/03/22 2000 02/04/22 0815  GLUCAP 187* 171* 117* 327* 143*      Anemia Panel: Recent Labs    02/02/22 0649 02/02/22 0830  FERRITIN 372*  --   TIBC 203*  --   IRON 45  --   RETICCTPCT  --  1.1     Recent Results (from the past 240 hour(s))  Urine Culture     Status: Abnormal   Collection Time: 02/01/22  4:26 PM   Specimen: Urine, Clean Catch  Result Value Ref Range Status   Specimen Description URINE, CLEAN CATCH  Final   Special Requests   Final    NONE Performed at [facility], [place] [address], [place], [place] [address]    Culture >=100,000 COLONIES/mL ESCHERICHIA COLI (A)  Final   Report Status 02/04/2022 FINAL  Final   Organism ID, Bacteria ESCHERICHIA COLI (A)  Final      Susceptibility   Escherichia coli - MIC*    AMPICILLIN 8 SENSITIVE Sensitive     CEFAZOLIN <=4 SENSITIVE Sensitive     CEFEPIME <=0.12 SENSITIVE Sensitive     CEFTRIAXONE <=0.25 SENSITIVE Sensitive     CIPROFLOXACIN <=0.25 SENSITIVE Sensitive     GENTAMICIN <=1 SENSITIVE Sensitive     IMIPENEM <=0.25 SENSITIVE Sensitive     NITROFURANTOIN <=16 SENSITIVE Sensitive     TRIMETH/SULFA <=20 SENSITIVE Sensitive      AMPICILLIN/SULBACTAM <=2 SENSITIVE Sensitive     PIP/TAZO <=4 SENSITIVE Sensitive     * >=100,000 COLONIES/mL ESCHERICHIA COLI  Resp Panel by RT-PCR (Flu A&B, Covid) Nasopharyngeal Swab     Status: None   Collection Time: 02/01/22  7:41 PM   Specimen: Nasopharyngeal Swab; Nasopharyngeal(NP) swabs in vial transport medium  Result Value Ref Range Status   SARS Coronavirus 2 by RT PCR NEGATIVE NEGATIVE Final    Comment: (NOTE) SARS-CoV-2 target nucleic acids are NOT DETECTED.  The SARS-CoV-2 RNA is generally detectable in upper respiratory specimens during the acute phase of infection. The lowest concentration of SARS-CoV-2 viral copies this assay can detect is 138 copies/mL. A negative result does not preclude SARS-Cov-2 infection and should not be used as the sole basis for treatment or other patient management decisions. A negative result may occur with  improper specimen collection/handling, submission of specimen other than nasopharyngeal swab, presence of viral mutation(s) within the  areas targeted by this assay, and inadequate number of viral copies(<138 copies/mL). A negative result must be combined with clinical observations, patient history, and epidemiological information. The expected result is Negative.  Fact Sheet for Patients:  EntrepreneurPulse.com.au  Fact Sheet for Healthcare Providers:  IncredibleEmployment.be  This test is no t yet approved or cleared by the [place] FDA and  has been authorized for detection and/or diagnosis of SARS-CoV-2 by FDA under an Emergency Use Authorization (EUA). This EUA will remain  in effect (meaning this test can be used) for the duration of the COVID-19 declaration under Section 564(b)(1) of the Act, 21 U.S.C.section 360bbb-3(b)(1), unless the authorization is terminated  or revoked sooner.       Influenza A by PCR NEGATIVE NEGATIVE Final   Influenza B by PCR NEGATIVE NEGATIVE Final     Comment: (NOTE) The Xpert Xpress SARS-CoV-2/FLU/RSV plus assay is intended as an aid in the diagnosis of influenza from Nasopharyngeal swab specimens and should not be used as a sole basis for treatment. Nasal washings and aspirates are unacceptable for Xpert Xpress SARS-CoV-2/FLU/RSV testing.  Fact Sheet for Patients: EntrepreneurPulse.com.au  Fact Sheet for Healthcare Providers: IncredibleEmployment.be  This test is not yet approved or cleared by the [place] FDA and has been authorized for detection and/or diagnosis of SARS-CoV-2 by FDA under an Emergency Use Authorization (EUA). This EUA will remain in effect (meaning this test can be used) for the duration of the COVID-19 declaration under Section 564(b)(1) of the Act, 21 U.S.C. section 360bbb-3(b)(1), unless the authorization is terminated or revoked.  Performed at [facility], [place] [address], [place], [place] [address]        Radiology Studies: No results found.     LOS: 3 days   Khalif Stender [employer] on [email or web address]  02/04/2022, 10:32 AM

## 2022-02-04 NOTE — TOC Initial Note (Signed)
Transition of Care [facility]) - Initial/Assessment Note    Patient Details  Name: Kenneth [name] MRN: [id number] Date of Birth: 04-03-1961  Transition of Care [facility]) CM/SW Contact:    [address], [place] Phone Number: 02/04/2022, 12:19 PM  Clinical Narrative:                  CSW received consult for possible SNF placement at time of discharge. CSW spoke with patient's sister Spain who handles pt's affairs. Florence reported that patient's home is currently condemned and pt was living with her prior to this hospitalization and will be living with his other sister upon dc from SNF until he is able to live on his own. Florence expressed understanding of PT recommendation and is agreeable to SNF placement at time of discharge. CSW discussed insurance authorization process and will provide Medicare SNF ratings list. Patient has received  COVID vaccines. CSW will send out referrals for review. Patient and [name] expressed being hopeful for rehab and to feel better soon. No further questions reported at this time.   Skilled Nursing Rehab Facilities-   [email or web address]   Ratings out of 5 possible   Name Address  Phone # [place] Inspection Overall  [facility] [address] [place] '5 5 2 4  '$ [facility]  [address], [place] [phone] '4 2 5 5  '$ [facility] [place], [place] '4 1 1 1  '$ [place], [place] '2 2 4 4  '$ [facility] [address], [place] '1 1 2 1  '$ [place] [place] '2 1 4 3  '$ [facility] [address] [place] '5 2 2 3  '$ [facility] [address] [place] '5 2 2 3  '$ [address] ([place]) [place], [address] [phone] '5 1 2 2  '$ [facility] [address], [place] [phone] '4 1  1 1  '$ [facility] [address] [facility] [phone] '4 1 2 1  '$ [facility] [place] [address], [place] [phone] '4 1 1 1          '$ [facility], [place] [address], [place] '4 2 3 3  '$ [facility] [place] [address] [place] '3 1 5 4  '$ [address] [place], [address] [phone] '2 1 1 1  '$ [facility] [address], [place] [phone] '2 2 3 3          '$ [address] (no [facility]) [place] [address], [place] [phone] '4 5 5 5  '$ [facility] (No Humana) [address] [phone] [address], [place] '4 1 4 3  '$ [facility] (No UHC) [place], [place] [address] '5 5 5 5  '$ [facility] [address] [place] [phone] '3 2 4 4  '$ [phone] [address] [place] '3 3 4 4  '$ [facility] [address], [place] '1 1 2 1  '$ [facility] [address] [place] '2 1 1 1  '$ [facility] [place], [place] '5 2 4 5  '$ [facility] [address], [place] '3 1 1 1  '$ [facility] [place], [place] '2 1 2 1          '$ [facility]  [address] [place] [phone] '1 1 1 1  '$ [facility] [address] [place]  [phone] '2 4 2 2  '$ [facility] [address], [place] [phone] '5 2 3 4  '$ [facility] [place] [address], [place] '2 1 1 1  '$ [place] (No Humana) [address], [address] [phone] '2 1 3 2  '$ [facility] [address], [place] [phone] '3 1 1 1          '$ [facility] [place], [place] '5 4 5 5  '$ [name])  [facility] [address], [place] '2 2 3 3  '$ [facility]) [place] [address] [phone] '3 2 4 4  '$ [place]  [address], [place] '4 3 4 4  '$ [address] [place], [place] '3 3 1 1  '$ [facility]) [address] [place] [phone] '2 2 4 4    '$ Expected Discharge Plan: [place] Barriers to Discharge: Continued Medical Work up, [address], Active Substance Use - Placement   Patient Goals and CMS Choice Patient states their goals for this hospitalization and ongoing recovery are:: Get stronger CMS Medicare.gov Compare Post Acute Care list provided to:: Other (Comment Required) (Sister, [id number]) Choice offered to / list presented to : Sibling  Expected Discharge Plan and Services Expected Discharge Plan: [place] In-house Referral: Clinical Social Work     Living arrangements for the past 2 months: Single Family Home                                      Prior Living Arrangements/Services Living arrangements for the past 2 months: Single Family Home Lives with:: Self Patient language and need for interpreter reviewed:: Yes Do you feel safe going back to the place where you live?: No   No water, electricity  Need for Family Participation in Patient Care: Yes (Comment) Care giver support system in place?: Yes (comment)   Criminal Activity/Legal Involvement Pertinent to Current Situation/Hospitalization: No - Comment as needed  Activities of Daily Living Home Assistive Devices/Equipment: None ADL Screening (condition at time of admission) Patient's cognitive ability adequate to safely complete daily activities?: Yes Is the patient deaf or have difficulty hearing?: No Does the patient have difficulty seeing, even when wearing glasses/contacts?: No Does the patient have difficulty concentrating, remembering, or making decisions?: No Patient able to express need for assistance with ADLs?: Yes Does the patient have difficulty dressing or bathing?: No Independently performs  ADLs?: Yes (appropriate for developmental age) Does the patient have difficulty walking or climbing stairs?: Yes Weakness of Legs: Both Weakness of Arms/Hands: None  Permission Sought/Granted Permission sought to share information with : Case Manager, [id number], Family Supports Permission granted to share information with : Yes, Verbal Permission Granted  Share Information with NAME: Customer service manager  Permission granted to share info w AGENCY: SNFs  Permission granted to share info w Relationship: Sibling  Permission granted to share info w Contact Information: [phone]  Emotional Assessment Appearance:: Appears older than stated age     Orientation: : Oriented to Self, Oriented to Place, Oriented to  Time, Oriented to Situation Alcohol / Substance Use: Alcohol Use Psych Involvement: No (comment)  Admission diagnosis:  Upper GI bleed [K92.2] Failure to thrive in adult [R62.7] Generalized weakness [R53.1] Patient  Active Problem List   Diagnosis Date Noted   Pressure injury of skin 02/03/2022   Protein-calorie malnutrition, severe 02/03/2022   Gastritis and gastroduodenitis    Pancreatic cyst - mass 02/02/2022   Urinary tract infection 02/02/2022   Normocytic anemia 02/02/2022   Weight loss    Failure to thrive in adult 02/01/2022   Cancer ([place]) 02/01/2022   Occult GI bleeding 02/01/2022   DNR (do not resuscitate) 02/01/2022   CVA (cerebral vascular accident) ([place]) 01/26/2022   Acute CVA (cerebrovascular accident) ([place]) 01/25/2022   Dizziness 01/24/2022   ARF (acute renal failure) ([place]) 01/24/2022   Hyponatremia 01/24/2022   Elevated PSA 11/24/2021   Hypokalemia 11/24/2021   Morbid obesity ([place]) 11/06/2018   Hx of colonic polyps 03/28/2017   Metastatic melanoma to lung ([place]) 12/12/2012   Diabetes mellitus without complication ([place]) 23/30/0762   Dyslipidemia 08/20/2007   Essential hypertension 08/19/2007   PCP:  Erline Hau,  MD Pharmacy[facility]   [id number] [place], [id number]: [phone]: [phone]    Social Determinants of Health (SDOH) Interventions    Readmission Risk Interventions No flowsheet data found.

## 2022-02-04 NOTE — Evaluation (Signed)
Physical Therapy Evaluation ?Patient Details ?Name: Kenneth [name] ?MRN: [id number] ?DOB: 1961-09-05 ?Today's Date: 02/04/2022 ? ?History of Present Illness ? 61 y.o. male admitted 3/1 with fatigue and GIB s/p EGD 3/3. PMHx: 01/24/22 CVA with left pontine & right thalamic infarct, melanoma with mets to brain and lung, DM, HTN, HLD, polyneuropathy, ETOH abuse  ?Clinical Impression ? Pt pleasant and reports living with his sister since recent D/C requiring assist for all iADLs and has not been driving or back to work. Pt struggling with self care and mobility at home with continued decline in function with current admission. Pt with decreased strength, balance, cognition and mobility who will benefit from acute therapy to maximize mobility and safety. Pt's ultimate goal is to return home alone and pt would benefit from increased rehab to care for pt and progress function.    ?   ? ?Recommendations for follow up therapy are one component of a multi-disciplinary discharge planning process, led by the attending physician.  Recommendations may be updated based on patient status, additional functional criteria and insurance authorization. ? ?Follow Up Recommendations Skilled nursing-short term rehab (<3 hours/day) ? ?  ?Assistance Recommended at Discharge Frequent or constant Supervision/Assistance  ?Patient can return home with the following ? A little help with walking and/or transfers;A little help with bathing/dressing/bathroom;Assist for transportation;Help with stairs or ramp for entrance;Assistance with cooking/housework;Direct supervision/assist for medications management;Direct supervision/assist for financial management ? ?  ?Equipment Recommendations BSC/3in1  ?Recommendations for Other Services ?    ?  ?Functional Status Assessment Patient has had a recent decline in their functional status and/or demonstrates limited ability to make significant improvements in function in a reasonable and predictable amount of  time  ? ?  ?Precautions / Restrictions Precautions ?Precautions: Fall ?Precaution Comments: urinary urgency  ? ?  ? ?Mobility ? Bed Mobility ?Overal bed mobility: Needs Assistance ?Bed Mobility: Supine to Sit ?  ?  ?Supine to sit: Min assist ?  ?  ?General bed mobility comments: HOB 20 degrees with increased time and effort with pt struggling and ultimately required min assist to exit bed ?  ? ?Transfers ?Overall transfer level: Needs assistance ?  ?Transfers: Sit to/from Stand, Bed to chair/wheelchair/BSC ?Sit to Stand: Min assist ?Stand pivot transfers: Min guard ?  ?  ?  ?  ?General transfer comment: min assist to rise from bed x 2 and from chair x 2 with cues for hand placement and safety. Minguard with RW to pivot to [facility] ?  ? ?Ambulation/Gait ?Ambulation/Gait assistance: Min assist ?Gait Distance (Feet): 32 Feet ?Assistive device: Rolling walker (2 wheels) ?Gait Pattern/deviations: Step-through pattern, Decreased stride length, Shuffle ?  ?Gait velocity interpretation: <1.31 ft/sec, indicative of household ambulator ?  ?General Gait Details: pt with very short steps with shuffling pattern, narrowed BOS and min assist for balance. pt walked 32' x 2 trials with seated rest between ? ?Stairs ?  ?  ?  ?  ?  ? ?Wheelchair Mobility ?  ? ?Modified Rankin (Stroke Patients Only) ?  ? ?  ? ?Balance Overall balance assessment: Needs assistance ?Sitting-balance support: No upper extremity supported ?Sitting balance-Leahy Scale: Fair ?  ?  ?Standing balance support: Bilateral upper extremity supported, During functional activity ?Standing balance-Leahy Scale: Poor ?Standing balance comment: RW for static standing and gait ?  ?  ?  ?  ?  ?  ?  ?  ?  ?  ?  ?   ? ? ? ?Pertinent Vitals/Pain Pain Assessment ?  Pain Assessment: No/denies pain  ? ? ?Home Living Family/patient expects to be discharged to:: Private residence ?Living Arrangements: Other relatives ?Available Help at Discharge: Family;Available PRN/intermittently ?Type  of Home: House ?Home Access: Stairs to enter ?  ?Entrance Stairs-Number of Steps: 1 ?  ?Home Layout: One level ?Home Equipment: Cane - quad;Cane - single Barista (2 wheels) ?Additional Comments: works as a Research scientist (physical sciences) at [employer] but has not returned to work since last admission. Lives alone but has been staying with sister who also cares for their mother  ?  ?Prior Function Prior Level of Function : Needs assist ?  ?  ?  ?  ?  ?ADLs (physical): IADLs ?Mobility Comments: uses RW and was working. ?ADLs Comments: reports still able to perform ADLS with sister assist for IADLs. ?  ? ? ?Hand Dominance  ?   ? ?  ?Extremity/Trunk Assessment  ? Upper Extremity Assessment ?Upper Extremity Assessment: Generalized weakness ?  ? ?Lower Extremity Assessment ?Lower Extremity Assessment: Generalized weakness ?  ? ?Cervical / Trunk Assessment ?Cervical / Trunk Assessment: Other exceptions ?Cervical / Trunk Exceptions: rounded shoulders  ?Communication  ? Communication: No difficulties  ?Cognition Arousal/Alertness: Awake/alert ?Behavior During Therapy: Flat affect ?Overall Cognitive Status: Impaired/Different from baseline ?Area of Impairment: Problem solving, Safety/judgement ?  ?  ?  ?  ?  ?  ?  ?  ?  ?  ?  ?  ?Safety/Judgement: Decreased awareness of deficits ?  ?Problem Solving: Slow processing ?General Comments: pt with urine soaked linens without awareness or alarm. Pt with difficulty providing consistent report of PLOF ?  ?  ? ?  ?General Comments   ? ?  ?Exercises    ? ?Assessment/Plan  ?  ?PT Assessment Patient needs continued PT services  ?PT Problem List Decreased strength;Decreased activity tolerance;Decreased balance;Decreased mobility;Decreased coordination;Decreased cognition;Decreased knowledge of use of DME;Decreased safety awareness ? ?   ?  ?PT Treatment Interventions DME instruction;Gait training;Stair training;Functional mobility training;Therapeutic activities;Therapeutic exercise;Balance  training;Neuromuscular re-education   ? ?PT Goals (Current goals can be found in the Care Plan section)  ?Acute Rehab PT Goals ?Patient Stated Goal: be able to walk and return to work ?PT Goal Formulation: With patient ?Time For Goal Achievement: 02/18/22 ?Potential to Achieve Goals: Fair ? ?  ?Frequency Min 2X/week ?  ? ? ?Co-evaluation   ?  ?  ?  ?  ? ? ?  ?AM-PAC PT "6 Clicks" Mobility  ?Outcome Measure Help needed turning from your back to your side while in a flat bed without using bedrails?: A Little ?Help needed moving from lying on your back to sitting on the side of a flat bed without using bedrails?: A Little ?Help needed moving to and from a bed to a chair (including a wheelchair)?: A Little ?Help needed standing up from a chair using your arms (e.g., wheelchair or bedside chair)?: A Little ?Help needed to walk in hospital room?: A Little ?Help needed climbing 3-5 steps with a railing? : A Lot ?6 Click Score: 17 ? ?  ?End of Session   ?Activity Tolerance: Patient tolerated treatment well ?Patient left: in chair;with call bell/phone within reach;with chair alarm set ?Nurse Communication: Mobility status ?PT Visit Diagnosis: Muscle weakness (generalized) (M62.81);History of falling (Z91.81);Other abnormalities of gait and mobility (R26.89) ?  ? ?Time: 5188-4166 ?PT Time Calculation (min) (ACUTE ONLY): 24 min ? ? ?Charges:   PT Evaluation ?$PT Eval Moderate Complexity: 1 Mod ?PT Treatments ?$Gait Training: 8-22 mins ?  ?   ? ? ?  Chelli Yerkes P, PT ?Acute Rehabilitation Services ?Pager: [phone] ?Office: [phone] ? ? ?Osama Coleson B Japji Kok ?02/04/2022, 9:39 AM ? ?

## 2022-02-04 NOTE — Evaluation (Signed)
Occupational Therapy Evaluation Patient Details Name: Kenneth [name] MRN: [id number] DOB: [birth year] Today's Date: 02/04/2022   History of Present Illness 61 y.o. male admitted 3/1 with fatigue and GIB s/p EGD 3/3. PMHx: 01/24/22 CVA with left pontine & right thalamic infarct, melanoma with mets to brain and lung, DM, HTN, HLD, polyneuropathy, ETOH abuse   Clinical Impression   Patient admitted for the diagnosis above.  PTA he was living with one of his sister's, but will be returning home with his other sister once he completes rehab at a local SNF.  Patient was able to complete bathing and dressing seated at the edge of his bed, with all supplies brought to him, but he was unable to step over the tub to take a shower.  He walked with a RW, but needed assist with meds and IADLs.  Deficits impacting independence are listed below.  Currently he is needing up to [facility] for all ADL completion and mobility.  Of note, the patient's sister will be working during the day, so the patient needs to be able to complete toileting and light meal prep at Mod I level prior to returning home.  OT will follow in the acute setting, but a short rehab stay at a local SNF is recommended to maximize his functional status.       Recommendations for follow up therapy are one component of a multi-disciplinary discharge planning process, led by the attending physician.  Recommendations may be updated based on patient status, additional functional criteria and insurance authorization.   Follow Up Recommendations  Skilled nursing-short term rehab (<3 hours/day)    Assistance Recommended at Discharge Frequent or constant Supervision/Assistance  Patient can return home with the following A little help with walking and/or transfers;A little help with bathing/dressing/bathroom;Direct supervision/assist for medications management;Assist for transportation;Help with stairs or ramp for entrance    Functional Status Assessment   Patient has had a recent decline in their functional status and demonstrates the ability to make significant improvements in function in a reasonable and predictable amount of time.  Equipment Recommendations  Tub/shower bench    Recommendations for Other Services       Precautions / Restrictions Precautions Precautions: Fall Precaution Comments: urinary urgency Restrictions Weight Bearing Restrictions: No      Mobility Bed Mobility Overal bed mobility: Needs Assistance Bed Mobility: Supine to Sit     Supine to sit: Supervision, HOB elevated          Transfers Overall transfer level: Needs assistance Equipment used: Rolling walker (2 wheels) Transfers: Sit to/from Stand Sit to Stand: Min guard           General transfer comment: cues to push from bed      Balance Overall balance assessment: Needs assistance Sitting-balance support: Feet supported Sitting balance-Leahy Scale: Fair     Standing balance support: Bilateral upper extremity supported, Reliant on assistive device for balance Standing balance-Leahy Scale: Poor                             ADL either performed or assessed with clinical judgement   ADL       Grooming: Min guard;Standing   Upper Body Bathing: Set up;Sitting   Lower Body Bathing: Min guard;Sit to/from stand   Upper Body Dressing : Set up;Sitting   Lower Body Dressing: Min guard;Sit to/from stand   Toilet Transfer: Min guard;Rolling walker (2 wheels);Ambulation   Toileting- Clothing Manipulation and  Hygiene: Supervision/safety;Sitting/lateral lean               Vision Baseline Vision/History: 1 Wears glasses Patient Visual Report: No change from baseline       Perception Perception Perception: Within Functional Limits   Praxis Praxis Praxis: Intact    Pertinent Vitals/Pain Pain Assessment Pain Assessment: No/denies pain     Hand Dominance Right   Extremity/Trunk Assessment Upper Extremity  Assessment Upper Extremity Assessment: Generalized weakness   Lower Extremity Assessment Lower Extremity Assessment: Defer to PT evaluation   Cervical / Trunk Assessment Cervical / Trunk Assessment: Normal   Communication Communication Communication: No difficulties   Cognition Arousal/Alertness: Awake/alert Behavior During Therapy: Flat affect Overall Cognitive Status: Impaired/Different from baseline Area of Impairment: Problem solving, Safety/judgement                         Safety/Judgement: Decreased awareness of deficits   Problem Solving: Slow processing General Comments: pt again with urine soaked linens without awareness.  OT discussed toileting program - every two hours to void.     General Comments   VSS on RA    Exercises     Shoulder Instructions      Home Living Family/patient expects to be discharged to:: Private residence Living Arrangements: Other relatives (plan is to return home with one of his sister's) Available Help at Discharge: Family;Available PRN/intermittently (sister he is going to live with works during the day) Type of Home: House Home Access: Stairs to enter   Entrance Stairs-Rails: None Home Layout: One level Alternate Level Stairs-Number of Steps: 5 Alternate Level Stairs-Rails: Right Bathroom Shower/Tub: Teacher, early years/pre: Standard     Home Equipment: Cane - quad;Cane - single Barista (2 wheels)      Lives With: Family    Prior Functioning/Environment Prior Level of Function : Needs assist           ADLs (physical): IADLs   ADLs Comments: reports still able to perform ADL with sister assist for IADLs, meds and community mobility.        OT Problem List: Decreased strength;Decreased activity tolerance;Decreased range of motion;Impaired balance (sitting and/or standing);Decreased safety awareness;Decreased knowledge of use of DME or AE;Decreased knowledge of precautions;Decreased  cognition      OT Treatment/Interventions: Self-care/ADL training;Therapeutic exercise;Therapeutic activities;DME and/or AE instruction;Patient/family education;Balance training    OT Goals(Current goals can be found in the care plan section) Acute Rehab OT Goals Patient Stated Goal: Eventually go back to work OT Goal Formulation: With patient Time For Goal Achievement: 02/17/22 Potential to Achieve Goals: Good ADL Goals Pt Will Perform Grooming: with modified independence;standing Pt Will Perform Lower Body Bathing: with modified independence;sit to/from stand Pt Will Perform Lower Body Dressing: with modified independence;sit to/from stand Pt Will Transfer to Toilet: with modified independence;ambulating;regular height toilet Pt Will Perform Toileting - Clothing Manipulation and hygiene: with modified independence;sit to/from stand  OT Frequency: Min 2X/week    Co-evaluation              AM-PAC OT "6 Clicks" Daily Activity     Outcome Measure Help from another person eating meals?: None Help from another person taking care of personal grooming?: A Little Help from another person toileting, which includes using toliet, bedpan, or urinal?: A Little Help from another person bathing (including washing, rinsing, drying)?: A Little Help from another person to put on and taking off regular upper body clothing?: A Little Help from  another person to put on and taking off regular lower body clothing?: A Little 6 Click Score: 19   End of Session Equipment Utilized During Treatment: Gait belt;Rolling walker (2 wheels) Nurse Communication: Mobility status  Activity Tolerance: Patient tolerated treatment well Patient left: in chair;with call bell/phone within reach;with chair alarm set  OT Visit Diagnosis: Unsteadiness on feet (R26.81);Other abnormalities of gait and mobility (R26.89);Repeated falls (R29.6);Muscle weakness (generalized) (M62.81);History of falling (Z91.81)                 Time: 1747-1595 OT Time Calculation (min): 17 min Charges:  OT General Charges $OT Visit: 1 Visit OT Evaluation $OT Eval Moderate Complexity: 1 Mod  02/04/2022  RP, OTR/L  Acute Rehabilitation Services  Office:  [phone]   Metta Clines 02/04/2022, 3:08 PM

## 2022-02-05 ENCOUNTER — Encounter (HOSPITAL_COMMUNITY): Payer: Self-pay | Admitting: Internal Medicine

## 2022-02-05 LAB — GLUCOSE, CAPILLARY
Glucose-Capillary: 136 mg/dL — ABNORMAL HIGH (ref 70–99)
Glucose-Capillary: 359 mg/dL — ABNORMAL HIGH (ref 70–99)
Glucose-Capillary: 203 mg/dL — ABNORMAL HIGH (ref 70–99)
Glucose-Capillary: 216 mg/dL — ABNORMAL HIGH (ref 70–99)

## 2022-02-05 MED ORDER — SENNOSIDES-DOCUSATE SODIUM 8.6-50 MG PO TABS
2.0000 | ORAL_TABLET | Freq: Every day | ORAL | Status: DC
  Administered 2022-02-06 – 2022-02-08 (×3): 2 via ORAL
  Filled 2022-02-05 (×4): qty 2

## 2022-02-05 MED ORDER — POLYETHYLENE GLYCOL 3350 17 G PO PACK
17.0000 g | PACK | Freq: Every day | ORAL | Status: DC
Start: 2022-02-05 — End: 2022-02-10
  Administered 2022-02-05 – 2022-02-09 (×4): 17 g via ORAL
  Filled 2022-02-05 (×5): qty 1

## 2022-02-05 NOTE — Progress Notes (Signed)
[facility] PROGRESS NOTE   Kenneth [name] [name] DOB: 1961-04-20 DOA: 02/01/2022  4 DOS: the patient was seen and examined on 02/05/2022  PCP: Kenneth [name], Kenneth Halsted, MD  Brief History and Hospital Course:  61 y.o. 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 presented to the emergency department with significant fatigue, poor oral intake and weight loss.  Found to have occult blood on stool examination.  He was hospitalized for further management.  Seen by gastroenterology.  Underwent EGD and colonoscopy on 3/3.  Waiting on skilled nursing facility placement  Consultants: Gastroenterology  Procedures:   EGD Impression:               - Erythema in the esophagus. Biopsied.                           - Gastritis. Biopsied.                           - Duodenal erosions. Recommendation:           - Patient has a contact number available for                            emergencies. The signs and symptoms of potential                            delayed complications were discussed with the                            patient. Return to normal activities tomorrow.                            Written discharge instructions were provided to the                            patient.                           - Daily PPI single dose                           OK for clopidogrel                           Still waiting on CA19-9 to determine EUS vs MRI of                            pancreas lesion- that could be done outpatient - if                            CA 19-9 is up would do EUS (outpatient) if not up                            then most likely MRI/MRCP  Colonoscopy Impression:               -  Diverticulosis in the sigmoid colon.                           - External and  internal hemorrhoids.                           - The examination was otherwise normal on direct                            and retroflexion views.                           - No specimens collected.                           - Personal history of colonic polyps.   Subjective: Patient feels well.  Denies any complaints.  Has not had a bowel movement yet in a few days.  No nausea vomiting.  No abdominal pain.     Assessment/Plan:   * Failure to thrive in adult Patient was recently hospitalized for acute stroke.  He was discharged home with outpatient therapy.  Apparently at home he has not been doing well and has been nonambulatory.  Has had significant loss of appetite as well as loss of about 60 pounds of weight in the last 2 to 3 months. He was started on mirtazapine.  Appetite seems to be improving. Seen by physical therapy and they recommend short-term rehab.  Occult GI bleeding Patient noted to have downtrending hemoglobin.  Noted to be heme positive on stool examination.  Gastroenterology was consulted.  Patient started on PPI.  No overt bleeding noted. Patient underwent EGD and colonoscopy.  Colonoscopy showed diverticulosis in the sigmoid colon along with external and internal hemorrhoids.  No specimens were collected.   EGD showed erythema in the esophagus which was biopsied.  Gastritis and duodenal erosions were also noted.   Continue PPI. Hemoglobin has been stable.  Pancreatic cyst - mass A CT scan of the chest abdomen pelvis was ordered due to his history of weight loss. This revealed findings of chronic pancreatitis along with a new cystic mass within the pancreatic head measuring up to 3.6 cm.  Pancreatic ductal dilatation noted.  Concern is for cystic pancreatic neoplasm.  GI is managing this.  CA 19-9 level was noted to be normal.  Based on GI notes the plan would be to do MRI/MRCP in the outpatient setting.  GI to arrange this.  Urinary tract infection UA was noted to  be remarkably abnormal.  Patient did mention urinary frequency.  CT scan does not show any abnormality in the GU tract.  Started on ceftriaxone.  Urine culture is growing E. coli.  Noted to be pansensitive.  Changed over to cephalexin.  CVA (cerebral vascular accident) [facility]) Recent stroke in February.  At that time he was discharged home with outpatient therapy.  Supposed to be on aspirin and Plavix but had not filled his Plavix prescription yet.   Cleared by GI to resume his antiplatelet agents.  We will do aspirin and Plavix for 3 weeks followed by aspirin alone.  Diabetes mellitus without complication (HCC) Recent HbA1c 6.6.  Glucophage on hold.  Continue SSI.  Essential hypertension Continue amlodipine and metoprolol.  Zestoretic on hold. Blood pressure is reasonably well controlled. Electrolytes are  better.  Normocytic anemia Hemoglobin is stable.  Ferritin 372.  Continue to monitor  Metastatic melanoma to lung [facility]) He is in remission for 7 years.  Followed at [facility].  Recent MRI brain did not show any metastatic process.  Dyslipidemia Continue Lipitor     DVT Prophylaxis: SCDs Code Status: DNR Family Communication: Discussed with patient.  Discussed with the sister yesterday. Disposition Plan: Patient cannot return home since he does not have running water or electricity.  He lives by himself.  Seen by physical therapy and they recommend short-term rehab in a skilled nursing facility.  TOC is following.  Status is: Inpatient Remains inpatient appropriate because: Concern for GI bleed, urinary tract infection, significant weight loss       Medications: Scheduled:  amLODipine  10 mg Oral Daily   aspirin EC  81 mg Oral Daily   atorvastatin  20 mg Oral Daily   cephALEXin  500 mg Oral Q8H   clopidogrel  75 mg Oral Daily   feeding supplement  237 mL Oral TID BM   insulin aspart  0-5 Units Subcutaneous QHS   insulin aspart  0-9 Units Subcutaneous TID WC    metoprolol tartrate  100 mg Oral BID   mirtazapine  7.5 mg Oral QHS   multivitamin with minerals  1 tablet Oral Daily   pantoprazole  40 mg Oral BID   sodium chloride flush  3 mL Intravenous Q12H   Continuous:   JYN:[id number] **OR** acetaminophen, hydrALAZINE, morphine injection, ondansetron **OR** ondansetron (ZOFRAN) IV  Antibiotics: Anti-infectives (From admission, onward)    Start     Dose/Rate Route Frequency Ordered Stop   02/04/22 1400  cephALEXin (KEFLEX) capsule 500 mg        500 mg Oral Every 8 hours 02/04/22 1035 02/09/22 1359   02/02/22 1000  cefTRIAXone (ROCEPHIN) 1 g in sodium chloride 0.9 % 100 mL IVPB  Status:  Discontinued        1 g 200 mL/hr over 30 Minutes Intravenous Every 24 hours 02/02/22 0924 02/04/22 1035       Objective:  Vital Signs  Vitals:   02/04/22 1545 02/04/22 2115 02/05/22 0445 02/05/22 0811  BP: 115/75 110/79 129/71 (!) 149/77  Pulse: 71 85 67 75  Resp: '14 15 17 16  '$ Temp: 98.3 F (36.8 C) 98 F (36.7 C) 98 F (36.7 C) 98.2 F (36.8 C)  TempSrc: Oral Oral Oral   SpO2: 100% 100% 98% 99%  Weight:      Height:        Intake/Output Summary (Last 24 hours) at 02/05/2022 1043 Last data filed at 02/05/2022 0810 Gross per 24 hour  Intake 240 ml  Output 375 ml  Net -135 ml    Filed Weights   02/02/22 1657  Weight: 55.1 kg    General appearance: Awake alert.  In no distress Resp: Clear to auscultation bilaterally.  Normal effort Cardio: S1-S2 is normal regular.  No S3-S4.  No rubs murmurs or bruit GI: Abdomen is soft.  Nontender nondistended.  Bowel sounds are present normal.  No masses organomegaly Extremities: No edema.   Neurologic: Alert and oriented x3.  No focal neurological deficits.      Lab Results:  Data Reviewed: I have personally reviewed labs and imaging study reports  CBC: Recent Labs  Lab 02/01/22 1049 02/02/22 0830 02/03/22 0131 02/04/22 0114  WBC 8.6 14.0* 13.3* 7.2  NEUTROABS 6.8  --   --   --  HGB 11.5* 11.5* 11.3* 11.0*  HCT 32.3* 33.8* 32.3* 32.1*  MCV 90.0 91.8 91.0 90.9  PLT 353 337 336 348     Basic Metabolic Panel: Recent Labs  Lab 02/01/22 1049 02/02/22 0649 02/03/22 0131 02/04/22 0114 02/04/22 0717  NA 135 131* 133* 138  --   K 3.6 4.6 3.5 3.7  --   CL 97* 96* 103 108  --   CO2 '27 23 22 23  '$ --   GLUCOSE 148* 102* 131* 177*  --   BUN 45* 27* 15 10  --   CREATININE 1.27* 1.00 0.85 0.96  --   CALCIUM 9.2 8.7* 8.5* 8.7*  --   MG 1.3*  --   --  1.6*  --   PHOS  --   --   --   --  2.8     GFR: Estimated Creatinine Clearance: 63.8 mL/min (by C-G formula based on SCr of 0.96 mg/dL).  Liver Function Tests: Recent Labs  Lab 02/01/22 1049 02/03/22 0131 02/04/22 0114  AST 12* 17 13*  ALT '11 13 11  '$ ALKPHOS 34* 36* 38  BILITOT 0.7 0.5 0.3  PROT 5.8* 5.8* 5.6*  ALBUMIN 3.2* 2.9* 2.7*     CBG: Recent Labs  Lab 02/04/22 0815 02/04/22 1141 02/04/22 1648 02/04/22 1928 02/05/22 0808  GLUCAP 143* 134* 229* 160* 136*       Recent Results (from the past 240 hour(s))  Urine Culture     Status: Abnormal   Collection Time: 02/01/22  4:26 PM   Specimen: Urine, Clean Catch  Result Value Ref Range Status   Specimen Description URINE, CLEAN CATCH  Final   Special Requests   Final    NONE Performed at [facility], [place] [address] [place], [place] [address]    Culture >=100,000 COLONIES/mL ESCHERICHIA COLI (A)  Final   Report Status 02/04/2022 FINAL  Final   Organism ID, Bacteria ESCHERICHIA COLI (A)  Final      Susceptibility   Escherichia coli - MIC*    AMPICILLIN 8 SENSITIVE Sensitive     CEFAZOLIN <=4 SENSITIVE Sensitive     CEFEPIME <=0.12 SENSITIVE Sensitive     CEFTRIAXONE <=0.25 SENSITIVE Sensitive     CIPROFLOXACIN <=0.25 SENSITIVE Sensitive     GENTAMICIN <=1 SENSITIVE Sensitive     IMIPENEM <=0.25 SENSITIVE Sensitive     NITROFURANTOIN <=16 SENSITIVE Sensitive     TRIMETH/SULFA <=20 SENSITIVE Sensitive     AMPICILLIN/SULBACTAM  <=2 SENSITIVE Sensitive     PIP/TAZO <=4 SENSITIVE Sensitive     * >=100,000 COLONIES/mL ESCHERICHIA COLI  Resp Panel by RT-PCR (Flu A&B, Covid) Nasopharyngeal Swab     Status: None   Collection Time: 02/01/22  7:41 PM   Specimen: Nasopharyngeal Swab; Nasopharyngeal(NP) swabs in vial transport medium  Result Value Ref Range Status   SARS Coronavirus 2 by RT PCR NEGATIVE NEGATIVE Final    Comment: (NOTE) SARS-CoV-2 target nucleic acids are NOT DETECTED.  The SARS-CoV-2 RNA is generally detectable in upper respiratory specimens during the acute phase of infection. The lowest concentration of SARS-CoV-2 viral copies this assay can detect is 138 copies/mL. A negative result does not preclude SARS-Cov-2 infection and should not be used as the sole basis for treatment or other patient management decisions. A negative result may occur with  improper specimen collection/handling, submission of specimen other than nasopharyngeal swab, presence of viral mutation(s) within the areas targeted by this assay, and inadequate number of viral copies(<138 copies/mL).  A negative result must be combined with clinical observations, patient history, and epidemiological information. The expected result is Negative.  Fact Sheet for Patients:  EntrepreneurPulse.com.au  Fact Sheet for Healthcare Providers:  IncredibleEmployment.be  This test is no t yet approved or cleared by the [place] FDA and  has been authorized for detection and/or diagnosis of SARS-CoV-2 by FDA under an Emergency Use Authorization (EUA). This EUA will remain  in effect (meaning this test can be used) for the duration of the COVID-19 declaration under Section 564(b)(1) of the Act, 21 U.S.C.section 360bbb-3(b)(1), unless the authorization is terminated  or revoked sooner.       Influenza A by PCR NEGATIVE NEGATIVE Final   Influenza B by PCR NEGATIVE NEGATIVE Final    Comment: (NOTE) The  Xpert Xpress SARS-CoV-2/FLU/RSV plus assay is intended as an aid in the diagnosis of influenza from Nasopharyngeal swab specimens and should not be used as a sole basis for treatment. Nasal washings and aspirates are unacceptable for Xpert Xpress SARS-CoV-2/FLU/RSV testing.  Fact Sheet for Patients: EntrepreneurPulse.com.au  Fact Sheet for Healthcare Providers: IncredibleEmployment.be  This test is not yet approved or cleared by the [place] FDA and has been authorized for detection and/or diagnosis of SARS-CoV-2 by FDA under an Emergency Use Authorization (EUA). This EUA will remain in effect (meaning this test can be used) for the duration of the COVID-19 declaration under Section 564(b)(1) of the Act, 21 U.S.C. section 360bbb-3(b)(1), unless the authorization is terminated or revoked.  Performed at [facility], [place] [address], [place], [place] [address]        Radiology Studies: No results found.     LOS: 4 days   Dominic Mahaney [employer] on [email or web address]  02/05/2022, 10:43 AM

## 2022-02-06 DIAGNOSIS — K2970 Gastritis, unspecified, without bleeding: Secondary | ICD-10-CM

## 2022-02-06 DIAGNOSIS — K2990 Gastroduodenitis, unspecified, without bleeding: Secondary | ICD-10-CM

## 2022-02-06 LAB — GLUCOSE, CAPILLARY
Glucose-Capillary: 165 mg/dL — ABNORMAL HIGH (ref 70–99)
Glucose-Capillary: 256 mg/dL — ABNORMAL HIGH (ref 70–99)
Glucose-Capillary: 259 mg/dL — ABNORMAL HIGH (ref 70–99)
Glucose-Capillary: 278 mg/dL — ABNORMAL HIGH (ref 70–99)

## 2022-02-06 LAB — MAGNESIUM: Magnesium: 1.3 mg/dL — ABNORMAL LOW (ref 1.7–2.4)

## 2022-02-06 LAB — BASIC METABOLIC PANEL
Anion gap: 7 (ref 5–15)
BUN: 18 mg/dL (ref 6–20)
CO2: 26 mmol/L (ref 22–32)
Calcium: 8.5 mg/dL — ABNORMAL LOW (ref 8.9–10.3)
Chloride: 105 mmol/L (ref 98–111)
Creatinine, Ser: 0.87 mg/dL (ref 0.61–1.24)
GFR, Estimated: >60 mL/min (ref >60)
Glucose, Bld: 138 mg/dL — ABNORMAL HIGH (ref 70–99)
Potassium: 3.5 mmol/L (ref 3.5–5.1)
Sodium: 138 mmol/L (ref 135–145)

## 2022-02-06 LAB — CBC
HCT: 28.1 % — ABNORMAL LOW (ref 39.0–52.0)
Hemoglobin: 9.9 g/dL — ABNORMAL LOW (ref 13.0–17.0)
MCH: 31.6 pg (ref 26.0–34.0)
MCHC: 35.2 g/dL (ref 30.0–36.0)
MCV: 89.8 fL (ref 80.0–100.0)
Platelets: 369 10*3/uL (ref 150–400)
RBC: 3.13 MIL/uL — ABNORMAL LOW (ref 4.22–5.81)
RDW: 12.2 % (ref 11.5–15.5)
WBC: 5.9 10*3/uL (ref 4.0–10.5)
nRBC: 0 % (ref 0.0–0.2)

## 2022-02-06 LAB — SURGICAL PATHOLOGY

## 2022-02-06 MED ORDER — PANTOPRAZOLE SODIUM 40 MG PO TBEC: 40.0000 mg | DELAYED_RELEASE_TABLET | Freq: Every day | ORAL | Status: DC

## 2022-02-06 MED ORDER — SENNOSIDES-DOCUSATE SODIUM 8.6-50 MG PO TABS: 2.0000 | ORAL_TABLET | Freq: Every day | ORAL | Status: DC

## 2022-02-06 MED ORDER — ENSURE ENLIVE PO LIQD
237.0000 mL | Freq: Three times a day (TID) | ORAL | 12 refills | Status: DC
Start: 2022-02-06 — End: 2023-02-06

## 2022-02-06 MED ORDER — POLYETHYLENE GLYCOL 3350 17 G PO PACK: 17.0000 g | PACK | Freq: Every day | ORAL | 0 refills | Status: DC

## 2022-02-06 MED ORDER — PANTOPRAZOLE SODIUM 40 MG PO TBEC: 40.0000 mg | DELAYED_RELEASE_TABLET | Freq: Two times a day (BID) | ORAL | Status: DC

## 2022-02-06 MED ORDER — MAGNESIUM OXIDE -MG SUPPLEMENT 400 (240 MG) MG PO TABS: 400.0000 mg | ORAL_TABLET | Freq: Two times a day (BID) | ORAL | Status: DC

## 2022-02-06 MED ORDER — POTASSIUM CHLORIDE CRYS ER 20 MEQ PO TBCR
40.0000 meq | EXTENDED_RELEASE_TABLET | Freq: Once | ORAL | Status: AC
Start: 2022-02-06 — End: 2022-02-06
  Administered 2022-02-06: 40 meq via ORAL
  Filled 2022-02-06: qty 2

## 2022-02-06 MED ORDER — MIRTAZAPINE 7.5 MG PO TABS: 7.5000 mg | ORAL_TABLET | Freq: Every day | ORAL | 1 refills | Status: DC

## 2022-02-06 MED ORDER — MAGNESIUM SULFATE 4 GM/100ML IV SOLN
4.0000 g | Freq: Once | INTRAVENOUS | Status: AC
  Administered 2022-02-06: 4 g via INTRAVENOUS
  Filled 2022-02-06: qty 100

## 2022-02-06 MED ORDER — CLOPIDOGREL BISULFATE 75 MG PO TABS: 75.0000 mg | ORAL_TABLET | Freq: Every day | ORAL | 0 refills | Status: AC

## 2022-02-06 MED ORDER — CEPHALEXIN 500 MG PO CAPS: 500.0000 mg | ORAL_CAPSULE | Freq: Three times a day (TID) | ORAL | Status: DC

## 2022-02-06 MED ORDER — PANTOPRAZOLE SODIUM 40 MG PO TBEC
40.0000 mg | DELAYED_RELEASE_TABLET | Freq: Every day | ORAL | Status: DC
Start: 2022-02-07 — End: 2022-02-10
  Administered 2022-02-07 – 2022-02-09 (×3): 40 mg via ORAL
  Filled 2022-02-06 (×3): qty 1

## 2022-02-06 MED ORDER — MAGNESIUM OXIDE -MG SUPPLEMENT 400 (240 MG) MG PO TABS
400.0000 mg | ORAL_TABLET | Freq: Two times a day (BID) | ORAL | Status: DC
  Administered 2022-02-06 – 2022-02-09 (×8): 400 mg via ORAL
  Filled 2022-02-06 (×8): qty 1

## 2022-02-06 NOTE — TOC Progression Note (Addendum)
Transition of Care (TOC) - Progression Note  ? ? ?Patient Details  ?Name: Kenneth [name] ?MRN: [id number] ?Date of Birth: [birth year] ? ?Transition of Care (TOC) CM/SW Contact  ?Emeterio Reeve, LCSW ?Phone Number: ?02/06/2022, 10:25 AM ? ?Clinical Narrative:    ? ?Pt has no SNF bed offers at this time.   ? ?2:30- CSW received call from pts sister [place] requesting updates. CSW gave both bed offers. Pts sister chose [facility]. CSW asked Madelynn Done to start insurance auth. Pt is agreeable to [facility]. ? ?Expected Discharge Plan: [place] ?Barriers to Discharge: Continued Medical Work up, Ship broker, Active Substance Use - Placement ? ?Expected Discharge Plan and Services ?Expected Discharge Plan: [place] ?In-house Referral: Clinical Social Work ?  ?  ?Living arrangements for the past 2 months: [place] ?Expected Discharge Date: 02/06/22               ?  ?  ?  ?  ?  ?  ?  ?  ?  ?  ? ? ?Social Determinants of Health (SDOH) Interventions ?  ? ?Readmission Risk Interventions ?No flowsheet data found. ? ?Emeterio Reeve, LCSW ?Clinical Social Worker ? ?

## 2022-02-06 NOTE — Discharge Summary (Signed)
[facility]  Physician Discharge Summary   Patient ID: Kenneth [name] MRN: [id number] DOB/AGE: 61-Apr-1962 61 y.o.  Admit date: 02/01/2022 Discharge date:   02/06/2022   PCP: Kenneth [name], Kenneth Halsted, MD  DISCHARGE DIAGNOSES:  Principal Problem:   Failure to thrive in adult Active Problems:   Occult GI bleeding   Gastritis and gastroduodenitis   Pancreatic cyst - mass   Urinary tract infection   CVA (cerebral vascular accident) ([place])   Diabetes mellitus without complication ([place])   Essential hypertension   Normocytic anemia   Metastatic melanoma to lung (Kenneth [name])   Dyslipidemia   DNR (do not resuscitate)   Pressure injury of skin   Protein-calorie malnutrition, severe   RECOMMENDATIONS FOR OUTPATIENT FOLLOW UP: [facility]ology will arrange outpatient follow-up. They will arrange outpatient MRI for pancreatic cyst Plavix to end on 02/23/2022 and then he should take aspirin alone daily  Home Health: Going to SNF Equipment/Devices: None  CODE STATUS: DNR  DISCHARGE CONDITION: fair  Diet recommendation: Modified carbohydrate  INITIAL HISTORY: 61 y.o. 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 presented to the emergency department with significant fatigue, poor oral intake and weight loss.  Found to have occult blood on stool examination.  He was hospitalized for further management.  Seen by gastroenterology.  Underwent EGD and colonoscopy on 3/3.  Waiting on skilled nursing facility placement  Consultants: Gastroenterology   Procedures:    EGD Impression:               - Erythema in the esophagus. Biopsied.                           - Gastritis. Biopsied.                           - Duodenal erosions. Recommendation:           - Patient has a contact number  available for                            emergencies. The signs and symptoms of potential                            delayed complications were discussed with the                            patient. Return to normal activities tomorrow.                            Written discharge instructions were provided to the                            patient.                           - Daily PPI single dose                           OK for clopidogrel  Still waiting on CA19-9 to determine EUS vs MRI of                            pancreas lesion- that could be done outpatient - if                            CA 19-9 is up would do EUS (outpatient) if not up                            then most likely MRI/MRCP   Colonoscopy Impression:               - Diverticulosis in the sigmoid colon.                           - External and internal hemorrhoids.                           - The examination was otherwise normal on direct                            and retroflexion views.                           - No specimens collected.                           - Personal history of colonic polyps.    HOSPITAL COURSE:   * Failure to thrive in adult Patient was recently hospitalized for acute stroke.  He was discharged home with outpatient therapy.  Apparently at home he has not been doing well and has been nonambulatory.  Has had significant loss of appetite as well as loss of about 60 pounds of weight in the last 2 to 3 months. He was started on mirtazapine.  Appetite seems to be improving. Seen by physical therapy and they recommend short-term rehab.  Occult GI bleeding Patient noted to have downtrending hemoglobin.  Noted to be heme positive on stool examination.  Gastroenterology was consulted.  Patient started on PPI.  No overt bleeding noted. Patient underwent EGD and colonoscopy.  Colonoscopy showed diverticulosis in the sigmoid colon along with external and internal  hemorrhoids.  No specimens were collected.   EGD showed erythema in the esophagus which was biopsied.  Gastritis and duodenal erosions were also noted.   Continue PPI daily. Hemoglobin has been stable.  Pancreatic cyst - mass A CT scan of the chest abdomen pelvis was ordered due to his history of weight loss. This revealed findings of chronic pancreatitis along with a new cystic mass within the pancreatic head measuring up to 3.6 cm.  Pancreatic ductal dilatation noted.  Concern is for cystic pancreatic neoplasm.  GI is managing this.  CA 19-9 level was noted to be normal.  Based on GI notes the plan would be to do MRI/MRCP in the outpatient setting.  GI to arrange this.  Urinary tract infection UA was noted to be remarkably abnormal.  Patient did mention urinary frequency.  CT scan does not show any abnormality in the GU tract.  Started on ceftriaxone.  Urine culture is growing E. coli.  Noted  to be pansensitive.  Changed over to cephalexin.  Treatment to end on 3/8.  CVA (cerebral vascular accident) [facility] [place]) Recent stroke in February.  At that time he was discharged home with outpatient therapy.  Supposed to be on aspirin and Plavix but had not filled his Plavix prescription yet.   Cleared by GI to resume his antiplatelet agents.  We will prescribe aspirin and Plavix for 3 weeks followed by aspirin alone.  Diabetes mellitus without complication (HCC) Recent HbA1c 6.6.  Continue metformin.  Monitor CBGs.  Essential hypertension Blood pressure is better controlled on amlodipine and metoprolol.  Zestoretic has been discontinued for now.  Monitor blood pressures closely  Normocytic anemia Hemoglobin is stable.  Ferritin 372.   Metastatic melanoma to lung [facility]) He is in remission for 7 years.  Followed at [facility].  Recent MRI brain did not show any metastatic process.  Dyslipidemia Continue Lipitor   Severe protein calorie malnutrition Nutrition Problem: Severe Malnutrition (in the  context of social/environmental circumstances) Etiology: poor appetite Signs/Symptoms: severe muscle depletion, severe fat depletion, percent weight loss (15.9% x 3 months) Percent weight loss: 15.9 %  Patient is stable.  Okay for discharge to SNF when bed is available.   PERTINENT LABS:  The results of significant diagnostics from this hospitalization (including imaging, microbiology, ancillary and laboratory) are listed below for reference.    Microbiology: Recent Results (from the past 240 hour(s))  Urine Culture     Status: Abnormal   Collection Time: 02/01/22  4:26 PM   Specimen: Urine, Clean Catch  Result Value Ref Range Status   Specimen Description URINE, CLEAN CATCH  Final   Special Requests   Final    NONE Performed at [facility], [place] [address] [place], [place] [address]    Culture >=100,000 COLONIES/mL ESCHERICHIA COLI (A)  Final   Report Status 02/04/2022 FINAL  Final   Organism ID, Bacteria ESCHERICHIA COLI (A)  Final      Susceptibility   Escherichia coli - MIC*    AMPICILLIN 8 SENSITIVE Sensitive     CEFAZOLIN <=4 SENSITIVE Sensitive     CEFEPIME <=0.12 SENSITIVE Sensitive     CEFTRIAXONE <=0.25 SENSITIVE Sensitive     CIPROFLOXACIN <=0.25 SENSITIVE Sensitive     GENTAMICIN <=1 SENSITIVE Sensitive     IMIPENEM <=0.25 SENSITIVE Sensitive     NITROFURANTOIN <=16 SENSITIVE Sensitive     TRIMETH/SULFA <=20 SENSITIVE Sensitive     AMPICILLIN/SULBACTAM <=2 SENSITIVE Sensitive     PIP/TAZO <=4 SENSITIVE Sensitive     * >=100,000 COLONIES/mL ESCHERICHIA COLI  Resp Panel by RT-PCR (Flu A&B, Covid) Nasopharyngeal Swab     Status: None   Collection Time: 02/01/22  7:41 PM   Specimen: Nasopharyngeal Swab; Nasopharyngeal(NP) swabs in vial transport medium  Result Value Ref Range Status   SARS Coronavirus 2 by RT PCR NEGATIVE NEGATIVE Final    Comment: (NOTE) SARS-CoV-2 target nucleic acids are NOT DETECTED.  The SARS-CoV-2 RNA is generally detectable in  upper respiratory specimens during the acute phase of infection. The lowest concentration of SARS-CoV-2 viral copies this assay can detect is 138 copies/mL. A negative result does not preclude SARS-Cov-2 infection and should not be used as the sole basis for treatment or other patient management decisions. A negative result may occur with  improper specimen collection/handling, submission of specimen other than nasopharyngeal swab, presence of viral mutation(s) within the areas targeted by this assay, and inadequate number of viral copies(<138 copies/mL). A  negative result must be combined with clinical observations, patient history, and epidemiological information. The expected result is Negative.  Fact Sheet for Patients:  EntrepreneurPulse.com.au  Fact Sheet for Healthcare Providers:  IncredibleEmployment.be  This test is no t yet approved or cleared by the [place] FDA and  has been authorized for detection and/or diagnosis of SARS-CoV-2 by FDA under an Emergency Use Authorization (EUA). This EUA will remain  in effect (meaning this test can be used) for the duration of the COVID-19 declaration under Section 564(b)(1) of the Act, 21 U.S.C.section 360bbb-3(b)(1), unless the authorization is terminated  or revoked sooner.       Influenza A by PCR NEGATIVE NEGATIVE Final   Influenza B by PCR NEGATIVE NEGATIVE Final    Comment: (NOTE) The Xpert Xpress SARS-CoV-2/FLU/RSV plus assay is intended as an aid in the diagnosis of influenza from Nasopharyngeal swab specimens and should not be used as a sole basis for treatment. Nasal washings and aspirates are unacceptable for Xpert Xpress SARS-CoV-2/FLU/RSV testing.  Fact Sheet for Patients: EntrepreneurPulse.com.au  Fact Sheet for Healthcare Providers: IncredibleEmployment.be  This test is not yet approved or cleared by the [place] FDA and has been  authorized for detection and/or diagnosis of SARS-CoV-2 by FDA under an Emergency Use Authorization (EUA). This EUA will remain in effect (meaning this test can be used) for the duration of the COVID-19 declaration under Section 564(b)(1) of the Act, 21 U.S.C. section 360bbb-3(b)(1), unless the authorization is terminated or revoked.  Performed at [facility], [place] [address], [place], [place] [address]      Labs:  COVID-19 Labs   Lab Results  Component Value Date   [place] 02/01/2022   [place] NEGATIVE 01/24/2022      Basic Metabolic Panel: Recent Labs  Lab 02/01/22 1049 02/02/22 0649 02/03/22 0131 02/04/22 0114 02/04/22 0717 02/06/22 0043  NA 135 131* 133* 138  --  138  K 3.6 4.6 3.5 3.7  --  3.5  CL 97* 96* 103 108  --  105  CO2 '27 23 22 23  '$ --  26  GLUCOSE 148* 102* 131* 177*  --  138*  BUN 45* 27* 15 10  --  18  CREATININE 1.27* 1.00 0.85 0.96  --  0.87  CALCIUM 9.2 8.7* 8.5* 8.7*  --  8.5*  MG 1.3*  --   --  1.6*  --  1.3*  PHOS  --   --   --   --  2.8  --    Liver Function Tests: Recent Labs  Lab 02/01/22 1049 02/03/22 0131 02/04/22 0114  AST 12* 17 13*  ALT '11 13 11  '$ ALKPHOS 34* 36* 38  BILITOT 0.7 0.5 0.3  PROT 5.8* 5.8* 5.6*  ALBUMIN 3.2* 2.9* 2.7*    CBC: Recent Labs  Lab 02/01/22 1049 02/02/22 0830 02/03/22 0131 02/04/22 0114 02/06/22 0043  WBC 8.6 14.0* 13.3* 7.2 5.9  NEUTROABS 6.8  --   --   --   --   HGB 11.5* 11.5* 11.3* 11.0* 9.9*  HCT 32.3* 33.8* 32.3* 32.1* 28.1*  MCV 90.0 91.8 91.0 90.9 89.8  PLT 353 337 336 348 369     CBG: Recent Labs  Lab 02/05/22 0808 02/05/22 1212 02/05/22 1631 02/05/22 2112 02/06/22 0751  GLUCAP 136* 359* 203* 216* 165*     IMAGING STUDIES CT CHEST ABDOMEN PELVIS W CONTRAST  Result Date: 02/02/2022 CLINICAL DATA:  Unintended weight loss.  Melanoma EXAM: CT CHEST, ABDOMEN,  AND PELVIS WITH CONTRAST TECHNIQUE: Multidetector CT imaging of the chest, abdomen and  pelvis was performed following the standard protocol during bolus administration of intravenous contrast. RADIATION DOSE REDUCTION: This exam was performed according to the departmental dose-optimization program which includes automated exposure control, adjustment of the mA and/or kV according to patient size and/or use of iterative reconstruction technique. CONTRAST:  161m OMNIPAQUE IOHEXOL 350 MG/ML SOLN COMPARISON:  11/10/2019 FINDINGS: CT CHEST FINDINGS Cardiovascular: Heart is normal size. Scattered coronary artery and aortic calcifications. No aneurysm. Retroesophageal right subclavian artery noted. Mediastinum/Nodes: No mediastinal, hilar, or axillary adenopathy. Trachea and esophagus are unremarkable. Thyroid unremarkable. Small hiatal hernia. Lungs/Pleura: Lungs are clear. No focal airspace opacities or suspicious nodules. No effusions. Musculoskeletal: Chest wall soft tissues are unremarkable. No acute bony abnormality. Old healed fractures bilaterally. CT ABDOMEN PELVIS FINDINGS Hepatobiliary: No focal hepatic abnormality. Gallbladder unremarkable. Pancreas: No focal hepatic abnormality. Gallbladder unremarkable. Calcifications in the pancreatic head and body compatible with chronic pancreatitis. Mixed density predominantly low-density mass noted in the pancreatic head/uncinate process measuring 3.6 x 3.4 cm. There is pancreatic ductal dilatation. Pancreatic duct measures up to 7 mm in the pancreatic head/body. Spleen: No focal abnormality.  Normal size. Adrenals/Urinary Tract: No adrenal abnormality. No focal renal abnormality. No stones or hydronephrosis. Urinary bladder is unremarkable. Stomach/Bowel: Stomach, large and small bowel grossly unremarkable. Vascular/Lymphatic: Aortic atherosclerosis. No evidence of aneurysm or adenopathy. Reproductive: Mildly prominent prostate Other: No free fluid or free air. Musculoskeletal: No acute bony abnormality. IMPRESSION: No acute cardiopulmonary disease.  Coronary artery disease, aortic atherosclerosis. Small hiatal hernia. Changes of chronic pancreatitis. New cystic mass within the pancreatic head/uncinate process measuring up to 3.6 cm. Pancreatic ductal dilatation noted. Findings concerning for possible cystic pancreatic neoplasm. When the patient is clinically stable and able to follow directions and hold their breath (preferably as an outpatient) further evaluation with dedicated abdominal MRI should be considered. Electronically Signed   By: KRolm BaptiseM.D.   On: 02/02/2022 00:10    DISCHARGE EXAMINATION: Vitals:   02/05/22 1634 02/05/22 2003 02/06/22 0510 02/06/22 0738  BP: 125/68 133/66 140/68 139/79  Pulse: 79 84 63 74  Resp: '18 16 17 16  '$ Temp: 98.1 F (36.7 C)  97.7 F (36.5 C) 98 F (36.7 C)  TempSrc:   Oral Oral  SpO2: 100% 99% 99% 99%  Weight:      Height:       General appearance: Awake alert.  In no distress Resp: Clear to auscultation bilaterally.  Normal effort Cardio: S1-S2 is normal regular.  No S3-S4.  No rubs murmurs or bruit GI: Abdomen is soft.  Nontender nondistended.  Bowel sounds are present normal.  No masses organomegaly    DISPOSITION: SNF  Discharge Instructions     Call MD for:  difficulty breathing, headache or visual disturbances   Complete by: As directed    Call MD for:  extreme fatigue   Complete by: As directed    Call MD for:  persistant dizziness or light-headedness   Complete by: As directed    Call MD for:  persistant nausea and vomiting   Complete by: As directed    Call MD for:  severe uncontrolled pain   Complete by: As directed    Call MD for:  temperature >100.4   Complete by: As directed    Diet - low sodium heart healthy   Complete by: As directed    Diet Carb Modified   Complete by: As directed  Discharge instructions   Complete by: As directed    Please review instructions on the discharge summary.  You were cared for by a hospitalist during your hospital stay. If  you have any questions about your discharge medications or the care you received while you were in the hospital after you are discharged, you can call the unit and asked to speak with the hospitalist on call if the hospitalist that took care of you is not available. Once you are discharged, your primary care physician will handle any further medical issues. Please note that NO REFILLS for any discharge medications will be authorized once you are discharged, as it is imperative that you return to your primary care physician (or establish a relationship with a primary care physician if you do not have one) for your aftercare needs so that they can reassess your need for medications and monitor your lab values. If you do not have a primary care physician, you can call [phone] for a physician referral.   Increase activity slowly   Complete by: As directed    No wound care   Complete by: As directed          Allergies as of 02/06/2022       Reactions   Cat Hair Extract Shortness Of Breath, Swelling   Swelling, watery eyes        Medication List     STOP taking these medications    lisinopril-hydrochlorothiazide 20-12.5 MG tablet Commonly known as: ZESTORETIC       TAKE these medications    amLODipine 10 MG tablet Commonly known as: NORVASC Take 10 mg by mouth daily.   aspirin 81 MG EC tablet Take 1 tablet (81 mg total) by mouth daily. Swallow whole.   atorvastatin 20 MG tablet Commonly known as: LIPITOR Take 1 tablet (20 mg total) by mouth daily.   cephALEXin 500 MG capsule Commonly known as: KEFLEX Take 1 capsule (500 mg total) by mouth every 8 (eight) hours for 3 days.   clopidogrel 75 MG tablet Commonly known as: Plavix Take 1 tablet (75 mg total) by mouth daily for 17 days. LAST DOSE ON 02/23/2022 What changed: additional instructions   feeding supplement Liqd Take 237 mLs by mouth 3 (three) times daily between meals.   magnesium oxide 400 (240 Mg) MG  tablet Commonly known as: MAG-OX Take 1 tablet (400 mg total) by mouth 2 (two) times daily.   metFORMIN 1000 MG tablet Commonly known as: GLUCOPHAGE Take 1 tablet (1,000 mg total) by mouth 2 (two) times daily with a meal.   metoprolol tartrate 100 MG tablet Commonly known as: LOPRESSOR Take 100 mg by mouth 2 (two) times daily.   mirtazapine 7.5 MG tablet Commonly known as: REMERON Take 1 tablet (7.5 mg total) by mouth at bedtime.   multivitamin with minerals Tabs tablet Take 1 tablet by mouth daily.   pantoprazole 40 MG tablet Commonly known as: PROTONIX Take 1 tablet (40 mg total) by mouth daily. Start taking on: February 07, 2022   polyethylene glycol 17 g packet Commonly known as: MIRALAX / GLYCOLAX Take 17 g by mouth daily. Start taking on: February 07, 2022   senna-docusate 8.6-50 MG tablet Commonly known as: Senokot-S Take 2 tablets by mouth at bedtime.          Follow-up Information     Kenneth [name], Kenneth Halsted, MD. Schedule an appointment as soon as possible for a visit in 3 week(s).   Specialty: Internal Medicine  Contact information: [place] [address] [phone]         Gatha Mayer, MD Follow up.   Specialty: Gastroenterology Why: office will schedule f/u Contact information: [address] [place] [address] [phone]                 TOTAL DISCHARGE TIME: 35 minutes  [facility] Pager on [email or web address]  02/06/2022, 9:37 AM

## 2022-02-06 NOTE — Progress Notes (Signed)
Mobility Specialist Progress Note: ? ? 02/06/22 1120  ?Mobility  ?Activity Ambulated with assistance to bathroom;Ambulated with assistance in hallway  ?Level of Assistance Standby assist, set-up cues, supervision of patient - no hands on  ?Assistive Device Front wheel walker  ?Distance Ambulated (ft) 150 ft  ?Activity Response Tolerated well  ?$Mobility charge 1 Mobility  ? ?Pt received in bed willing to participate in mobility. No complaints of pain. Pt ambulated to BR and was able to have a BM, Required peri-care. Left in bed with call bell in reach and all needs met.  ? ?Massey Ruhland ?Mobility Specialist ?Primary Phone [phone] ? ?

## 2022-02-07 LAB — BASIC METABOLIC PANEL
Anion gap: 5 (ref 5–15)
BUN: 21 mg/dL — ABNORMAL HIGH (ref 6–20)
CO2: 26 mmol/L (ref 22–32)
Calcium: 8.6 mg/dL — ABNORMAL LOW (ref 8.9–10.3)
Chloride: 107 mmol/L (ref 98–111)
Creatinine, Ser: 0.85 mg/dL (ref 0.61–1.24)
GFR, Estimated: >60 mL/min (ref >60)
Glucose, Bld: 165 mg/dL — ABNORMAL HIGH (ref 70–99)
Potassium: 3.7 mmol/L (ref 3.5–5.1)
Sodium: 138 mmol/L (ref 135–145)

## 2022-02-07 LAB — GLUCOSE, CAPILLARY
Glucose-Capillary: 207 mg/dL — ABNORMAL HIGH (ref 70–99)
Glucose-Capillary: 229 mg/dL — ABNORMAL HIGH (ref 70–99)
Glucose-Capillary: 261 mg/dL — ABNORMAL HIGH (ref 70–99)
Glucose-Capillary: 263 mg/dL — ABNORMAL HIGH (ref 70–99)

## 2022-02-07 LAB — METHYLMALONIC ACID, SERUM: Methylmalonic Acid, Quantitative: 853 nmol/L — ABNORMAL HIGH (ref 0–378)

## 2022-02-07 LAB — MAGNESIUM: Magnesium: 1.4 mg/dL — ABNORMAL LOW (ref 1.7–2.4)

## 2022-02-07 MED ORDER — MAGNESIUM SULFATE 4 GM/100ML IV SOLN
4.0000 g | Freq: Once | INTRAVENOUS | Status: AC
  Administered 2022-02-07: 4 g via INTRAVENOUS
  Filled 2022-02-07: qty 100

## 2022-02-07 NOTE — Plan of Care (Signed)
  Problem: Nutrition: Goal: Adequate nutrition will be maintained Outcome: Progressing   Problem: Elimination: Goal: Will not experience complications related to bowel motility Outcome: Progressing   Problem: Pain Managment: Goal: General experience of comfort will improve Outcome: Progressing   Problem: Safety: Goal: Ability to remain free from injury will improve Outcome: Progressing   

## 2022-02-07 NOTE — Progress Notes (Signed)
Mobility Specialist Progress Note: ? ? 02/07/22 1616  ?Mobility  ?Activity Ambulated with assistance in hallway  ?Level of Assistance Contact guard assist, steadying assist  ?Assistive Device Front wheel walker  ?Distance Ambulated (ft) 200 ft  ?Activity Response Tolerated well  ?$Mobility charge 1 Mobility  ? ?Pt received in bed willing to participate in mobility. No complaints of pain or dizziness. Left in bed with call bell in reach and all needs met.  ? ?Ray Gervasi ?Mobility Specialist ?Primary Phone [phone] ? ?

## 2022-02-07 NOTE — Progress Notes (Signed)
Physical Therapy Treatment ?Patient Details ?Name: BEN HABERMANN ?MRN: [id number] ?DOB: 1961-01-02 ?Today's Date: 02/07/2022 ? ? ?History of Present Illness 61 y.o. male admitted 3/1 with fatigue and GIB s/p EGD 3/3. PMHx: 01/24/22 CVA with left pontine & right thalamic infarct, melanoma with mets to brain and lung, DM, HTN, HLD, polyneuropathy, ETOH abuse ? ?  ?PT Comments  ? ? Pt continues to make progress towards his goals however continues to have poor insight into his deficits, requiring increased cuing for safety. Pt reports full feeling in his head, "not dizziness" however VSS and pt can not describe any aggravating factors. D/c plans remain appropriate for safety. PT will continue to follow acutely. ?  ?Recommendations for follow up therapy are one component of a multi-disciplinary discharge planning process, led by the attending physician.  Recommendations may be updated based on patient status, additional functional criteria and insurance authorization. ? ?Follow Up Recommendations ? Skilled nursing-short term rehab (<3 hours/day) ?  ?  ?Assistance Recommended at Discharge Frequent or constant Supervision/Assistance  ?Patient can return home with the following A little help with walking and/or transfers;A little help with bathing/dressing/bathroom;Assist for transportation;Help with stairs or ramp for entrance;Assistance with cooking/housework;Direct supervision/assist for medications management;Direct supervision/assist for financial management ?  ?Equipment Recommendations ? BSC/3in1  ?  ?   ?Precautions / Restrictions Precautions ?Precautions: Fall ?Precaution Comments: urinary urgency ?Restrictions ?Weight Bearing Restrictions: No  ?  ? ?Mobility ? Bed Mobility ?Overal bed mobility: Needs Assistance ?Bed Mobility: Supine to Sit ?  ?  ?Supine to sit: Supervision, HOB elevated ?  ?  ?General bed mobility comments: supervision and use of bed rail to get to EoB ?  ? ?Transfers ?Overall transfer level: Needs  assistance ?Equipment used: Rolling walker (2 wheels) ?Transfers: Sit to/from Stand ?Sit to Stand: Min guard ?  ?  ?  ?  ?  ?General transfer comment: min guard for safety, good power up and self steady ?  ? ?Ambulation/Gait ?Ambulation/Gait assistance: Min guard ?Gait Distance (Feet): 125 Feet ?Assistive device: Rolling walker (2 wheels) ?Gait Pattern/deviations: Step-through pattern, Decreased stride length, Shuffle ?Gait velocity: decreased ?Gait velocity interpretation: <1.31 ft/sec, indicative of household ambulator ?  ?General Gait Details: min guard for safety, pt with slowed shuffling gait, L LE slightly shorter than R LE giving increased up and down motion to gait, pt reports full feeling in head throughout gait, unable to fully describe and does not want to stop ambulation ? ? ?  ? ? ?  ?Balance Overall balance assessment: Needs assistance ?Sitting-balance support: Feet supported ?Sitting balance-Leahy Scale: Fair ?  ?  ?Standing balance support: Bilateral upper extremity supported, Reliant on assistive device for balance ?Standing balance-Leahy Scale: Poor ?Standing balance comment: RW for static standing and gait ?  ?  ?  ?  ?  ?  ?  ?  ?  ?  ?  ?  ? ?  ?Cognition Arousal/Alertness: Awake/alert ?Behavior During Therapy: Flat affect ?Overall Cognitive Status: Impaired/Different from baseline ?Area of Impairment: Safety/judgement, Problem solving ?  ?  ?  ?  ?  ?  ?  ?  ?  ?  ?  ?  ?Safety/Judgement: Decreased awareness of deficits ?  ?Problem Solving: Slow processing ?General Comments: continues to have limited insight into safety and slowed response to questions about nebulous feeling in his head ?  ?  ? ?  ?   ?General Comments General comments (skin integrity, edema, etc.): VSS on RA ?  ?  ? ?  Pertinent Vitals/Pain Pain Assessment ?Pain Assessment: No/denies pain  ? ? ? ?PT Goals (current goals can now be found in the care plan section) Acute Rehab PT Goals ?Patient Stated Goal: be able to walk and  return to work ?PT Goal Formulation: With patient ?Time For Goal Achievement: 02/18/22 ?Potential to Achieve Goals: Fair ?Progress towards PT goals: Progressing toward goals ? ?  ?Frequency ? ? ? Min 2X/week ? ? ? ?  ?PT Plan Discharge plan needs to be updated  ? ? ?   ?AM-PAC PT "6 Clicks" Mobility   ?Outcome Measure ? Help needed turning from your back to your side while in a flat bed without using bedrails?: A Little ?Help needed moving from lying on your back to sitting on the side of a flat bed without using bedrails?: A Little ?Help needed moving to and from a bed to a chair (including a wheelchair)?: A Little ?Help needed standing up from a chair using your arms (e.g., wheelchair or bedside chair)?: A Little ?Help needed to walk in hospital room?: A Little ?Help needed climbing 3-5 steps with a railing? : A Lot ?6 Click Score: 17 ? ?  ?End of Session Equipment Utilized During Treatment: Gait belt ?Activity Tolerance: Patient tolerated treatment well ?Patient left: with call bell/phone within reach;in bed ?Nurse Communication: Mobility status ?PT Visit Diagnosis: Muscle weakness (generalized) (M62.81);History of falling (Z91.81);Other abnormalities of gait and mobility (R26.89) ?  ? ? ?Time: 9983-3825 ?PT Time Calculation (min) (ACUTE ONLY): 16 min ? ?Charges:  $Therapeutic Exercise: 8-22 mins          ?          ? ?Alvita Fana B. Migdalia Dk PT, DPT ?Acute Rehabilitation Services ?Pager [phone] ?Office [phone] ? ? ? ?Bailey Mech Fleet ?02/07/2022, 2:41 PM ? ?

## 2022-02-07 NOTE — Discharge Summary (Signed)
[facility]  Physician Discharge Summary   Patient ID: Kenneth [name] MRN: [id number] DOB/AGE: 05-02-1961 61 y.o.  Admit date: 02/01/2022 Discharge date:   02/07/2022   PCP: Isaac Bliss, Rayford Halsted, MD  DISCHARGE DIAGNOSES:  Principal Problem:   Failure to thrive in adult Active Problems:   Occult GI bleeding   Gastritis and gastroduodenitis   Pancreatic cyst - mass   Urinary tract infection   CVA (cerebral vascular accident) ([place])   Diabetes mellitus without complication ([place])   Essential hypertension   Normocytic anemia   Metastatic melanoma to lung ([place])   Dyslipidemia   DNR (do not resuscitate)   Pressure injury of skin   Protein-calorie malnutrition, severe   RECOMMENDATIONS FOR OUTPATIENT FOLLOW UP: [facility] will arrange outpatient follow-up. They will arrange outpatient MRI for pancreatic cyst Plavix to end on 02/23/2022 and then he should take aspirin alone daily  Home Health: Going to SNF Equipment/Devices: None  CODE STATUS: DNR  DISCHARGE CONDITION: fair  Diet recommendation: Modified carbohydrate  INITIAL HISTORY: 61 y.o. 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 presented to the emergency department with significant fatigue, poor oral intake and weight loss.  Found to have occult blood on stool examination.  He was hospitalized for further management.  Seen by gastroenterology.  Underwent EGD and colonoscopy on 3/3.  Waiting on skilled nursing facility placement.  Consultants: Gastroenterology   Procedures:    EGD Impression:               - Erythema in the esophagus. Biopsied.                           - Gastritis. Biopsied.                           - Duodenal erosions. Recommendation:           - Patient has a contact  number available for                            emergencies. The signs and symptoms of potential                            delayed complications were discussed with the                            patient. Return to normal activities tomorrow.                            Written discharge instructions were provided to the                            patient.                           - Daily PPI single dose                           OK for clopidogrel  Still waiting on CA19-9 to determine EUS vs MRI of                            pancreas lesion- that could be done outpatient - if                            CA 19-9 is up would do EUS (outpatient) if not up                            then most likely MRI/MRCP   Colonoscopy Impression:               - Diverticulosis in the sigmoid colon.                           - External and internal hemorrhoids.                           - The examination was otherwise normal on direct                            and retroflexion views.                           - No specimens collected.                           - Personal history of colonic polyps.    HOSPITAL COURSE:   * Failure to thrive in adult Patient was recently hospitalized for acute stroke.  He was discharged home with outpatient therapy.  Apparently at home he has not been doing well and has been nonambulatory.  Has had significant loss of appetite as well as loss of about 60 pounds of weight in the last 2 to 3 months. He was started on mirtazapine.  Appetite seems to be improving. Seen by physical therapy and they recommend short-term rehab.  Occult GI bleeding Patient noted to have downtrending hemoglobin.  Noted to be heme positive on stool examination.  Gastroenterology was consulted.  Patient started on PPI.  No overt bleeding noted. Patient underwent EGD and colonoscopy.  Colonoscopy showed diverticulosis in the sigmoid colon along with external and internal  hemorrhoids.  No specimens were collected.   EGD showed erythema in the esophagus which was biopsied.  Gastritis and duodenal erosions were also noted.   Continue PPI daily. Hemoglobin has been stable.  Pancreatic cyst - mass A CT scan of the chest abdomen pelvis was ordered due to his history of weight loss. This revealed findings of chronic pancreatitis along with a new cystic mass within the pancreatic head measuring up to 3.6 cm.  Pancreatic ductal dilatation noted.  Concern is for cystic pancreatic neoplasm.  GI is managing this.  CA 19-9 level was noted to be normal.  Based on GI notes the plan would be to do MRI/MRCP in the outpatient setting.  GI to arrange this.  Urinary tract infection UA was noted to be remarkably abnormal.  Patient did mention urinary frequency.  CT scan does not show any abnormality in the GU tract.  Started on ceftriaxone.  Urine culture is growing E. coli.  Noted  to be pansensitive.  Changed over to cephalexin.  Treatment to end on 3/8.  CVA (cerebral vascular accident) [facility]) Recent stroke in February.  At that time he was discharged home with outpatient therapy.  Supposed to be on aspirin and Plavix but had not filled his Plavix prescription yet.   Cleared by GI to resume his antiplatelet agents.  We will prescribe aspirin and Plavix for 3 weeks followed by aspirin alone.  Diabetes mellitus without complication (HCC) Recent HbA1c 6.6.  Continue metformin.  Monitor CBGs.  Essential hypertension Blood pressure is better controlled on amlodipine and metoprolol.  Zestoretic has been discontinued for now.  Monitor blood pressures closely  Normocytic anemia Hemoglobin is stable.  Ferritin 372.   Metastatic melanoma to lung [facility]) He is in remission for 7 years.  Followed at [facility].  Recent MRI brain did not show any metastatic process.  Dyslipidemia Continue Lipitor   Severe protein calorie malnutrition Nutrition Problem: Severe Malnutrition (in the  context of social/environmental circumstances) Etiology: poor appetite Signs/Symptoms: severe muscle depletion, severe fat depletion, percent weight loss (15.9% x 3 months) Percent weight loss: 15.9 %  Patient is stable.  Okay for discharge to SNF when bed is available.   PERTINENT LABS:  The results of significant diagnostics from this hospitalization (including imaging, microbiology, ancillary and laboratory) are listed below for reference.    Microbiology: Recent Results (from the past 240 hour(s))  Urine Culture     Status: Abnormal   Collection Time: 02/01/22  4:26 PM   Specimen: Urine, Clean Catch  Result Value Ref Range Status   Specimen Description URINE, CLEAN CATCH  Final   Special Requests   Final    NONE Performed at [facility], [place] [address] [place], [place] [address]    Culture >=100,000 COLONIES/mL ESCHERICHIA COLI (A)  Final   Report Status 02/04/2022 FINAL  Final   Organism ID, Bacteria ESCHERICHIA COLI (A)  Final      Susceptibility   Escherichia coli - MIC*    AMPICILLIN 8 SENSITIVE Sensitive     CEFAZOLIN <=4 SENSITIVE Sensitive     CEFEPIME <=0.12 SENSITIVE Sensitive     CEFTRIAXONE <=0.25 SENSITIVE Sensitive     CIPROFLOXACIN <=0.25 SENSITIVE Sensitive     GENTAMICIN <=1 SENSITIVE Sensitive     IMIPENEM <=0.25 SENSITIVE Sensitive     NITROFURANTOIN <=16 SENSITIVE Sensitive     TRIMETH/SULFA <=20 SENSITIVE Sensitive     AMPICILLIN/SULBACTAM <=2 SENSITIVE Sensitive     PIP/TAZO <=4 SENSITIVE Sensitive     * >=100,000 COLONIES/mL ESCHERICHIA COLI  Resp Panel by RT-PCR (Flu A&B, Covid) Nasopharyngeal Swab     Status: None   Collection Time: 02/01/22  7:41 PM   Specimen: Nasopharyngeal Swab; Nasopharyngeal(NP) swabs in vial transport medium  Result Value Ref Range Status   SARS Coronavirus 2 by RT PCR NEGATIVE NEGATIVE Final    Comment: (NOTE) SARS-CoV-2 target nucleic acids are NOT DETECTED.  The SARS-CoV-2 RNA is generally detectable in  upper respiratory specimens during the acute phase of infection. The lowest concentration of SARS-CoV-2 viral copies this assay can detect is 138 copies/mL. A negative result does not preclude SARS-Cov-2 infection and should not be used as the sole basis for treatment or other patient management decisions. A negative result may occur with  improper specimen collection/handling, submission of specimen other than nasopharyngeal swab, presence of viral mutation(s) within the areas targeted by this assay, and inadequate number of viral copies(<138 copies/mL). A  negative result must be combined with clinical observations, patient history, and epidemiological information. The expected result is Negative.  Fact Sheet for Patients:  EntrepreneurPulse.com.au  Fact Sheet for Healthcare Providers:  IncredibleEmployment.be  This test is no t yet approved or cleared by the [place] FDA and  has been authorized for detection and/or diagnosis of SARS-CoV-2 by FDA under an Emergency Use Authorization (EUA). This EUA will remain  in effect (meaning this test can be used) for the duration of the COVID-19 declaration under Section 564(b)(1) of the Act, 21 U.S.C.section 360bbb-3(b)(1), unless the authorization is terminated  or revoked sooner.       Influenza A by PCR NEGATIVE NEGATIVE Final   Influenza B by PCR NEGATIVE NEGATIVE Final    Comment: (NOTE) The Xpert Xpress SARS-CoV-2/FLU/RSV plus assay is intended as an aid in the diagnosis of influenza from Nasopharyngeal swab specimens and should not be used as a sole basis for treatment. Nasal washings and aspirates are unacceptable for Xpert Xpress SARS-CoV-2/FLU/RSV testing.  Fact Sheet for Patients: EntrepreneurPulse.com.au  Fact Sheet for Healthcare Providers: IncredibleEmployment.be  This test is not yet approved or cleared by the [place] FDA and has been  authorized for detection and/or diagnosis of SARS-CoV-2 by FDA under an Emergency Use Authorization (EUA). This EUA will remain in effect (meaning this test can be used) for the duration of the COVID-19 declaration under Section 564(b)(1) of the Act, 21 U.S.C. section 360bbb-3(b)(1), unless the authorization is terminated or revoked.  Performed at [facility], [place] [address] [place], [place] [address]      Labs:  COVID-19 Labs   Lab Results  Component Value Date   SARSCOV2NAA NEGATIVE 02/01/2022   [place] NEGATIVE 01/24/2022      Basic Metabolic Panel: Recent Labs  Lab 02/01/22 1049 02/02/22 0649 02/03/22 0131 02/04/22 0114 02/04/22 0717 02/06/22 0043 02/07/22 0104  NA 135 131* 133* 138  --  138 138  K 3.6 4.6 3.5 3.7  --  3.5 3.7  CL 97* 96* 103 108  --  105 107  CO2 '27 23 22 23  '$ --  26 26  GLUCOSE 148* 102* 131* 177*  --  138* 165*  BUN 45* 27* 15 10  --  18 21*  CREATININE 1.27* 1.00 0.85 0.96  --  0.87 0.85  CALCIUM 9.2 8.7* 8.5* 8.7*  --  8.5* 8.6*  MG 1.3*  --   --  1.6*  --  1.3* 1.4*  PHOS  --   --   --   --  2.8  --   --    Liver Function Tests: Recent Labs  Lab 02/01/22 1049 02/03/22 0131 02/04/22 0114  AST 12* 17 13*  ALT '11 13 11  '$ ALKPHOS 34* 36* 38  BILITOT 0.7 0.5 0.3  PROT 5.8* 5.8* 5.6*  ALBUMIN 3.2* 2.9* 2.7*    CBC: Recent Labs  Lab 02/01/22 1049 02/02/22 0830 02/03/22 0131 02/04/22 0114 02/06/22 0043  WBC 8.6 14.0* 13.3* 7.2 5.9  NEUTROABS 6.8  --   --   --   --   HGB 11.5* 11.5* 11.3* 11.0* 9.9*  HCT 32.3* 33.8* 32.3* 32.1* 28.1*  MCV 90.0 91.8 91.0 90.9 89.8  PLT 353 337 336 348 369     CBG: Recent Labs  Lab 02/06/22 0751 02/06/22 1118 02/06/22 1641 02/06/22 2027 02/07/22 0748  GLUCAP 165* 256* 259* 278* 207*     IMAGING STUDIES CT CHEST ABDOMEN PELVIS W CONTRAST  Result  Date: 02/02/2022 CLINICAL DATA:  Unintended weight loss.  Melanoma EXAM: CT CHEST, ABDOMEN, AND PELVIS WITH CONTRAST  TECHNIQUE: Multidetector CT imaging of the chest, abdomen and pelvis was performed following the standard protocol during bolus administration of intravenous contrast. RADIATION DOSE REDUCTION: This exam was performed according to the departmental dose-optimization program which includes automated exposure control, adjustment of the mA and/or kV according to patient size and/or use of iterative reconstruction technique. CONTRAST:  129m OMNIPAQUE IOHEXOL 350 MG/ML SOLN COMPARISON:  11/10/2019 FINDINGS: CT CHEST FINDINGS Cardiovascular: Heart is normal size. Scattered coronary artery and aortic calcifications. No aneurysm. Retroesophageal right subclavian artery noted. Mediastinum/Nodes: No mediastinal, hilar, or axillary adenopathy. Trachea and esophagus are unremarkable. Thyroid unremarkable. Small hiatal hernia. Lungs/Pleura: Lungs are clear. No focal airspace opacities or suspicious nodules. No effusions. Musculoskeletal: Chest wall soft tissues are unremarkable. No acute bony abnormality. Old healed fractures bilaterally. CT ABDOMEN PELVIS FINDINGS Hepatobiliary: No focal hepatic abnormality. Gallbladder unremarkable. Pancreas: No focal hepatic abnormality. Gallbladder unremarkable. Calcifications in the pancreatic head and body compatible with chronic pancreatitis. Mixed density predominantly low-density mass noted in the pancreatic head/uncinate process measuring 3.6 x 3.4 cm. There is pancreatic ductal dilatation. Pancreatic duct measures up to 7 mm in the pancreatic head/body. Spleen: No focal abnormality.  Normal size. Adrenals/Urinary Tract: No adrenal abnormality. No focal renal abnormality. No stones or hydronephrosis. Urinary bladder is unremarkable. Stomach/Bowel: Stomach, large and small bowel grossly unremarkable. Vascular/Lymphatic: Aortic atherosclerosis. No evidence of aneurysm or adenopathy. Reproductive: Mildly prominent prostate Other: No free fluid or free air. Musculoskeletal: No acute bony  abnormality. IMPRESSION: No acute cardiopulmonary disease. Coronary artery disease, aortic atherosclerosis. Small hiatal hernia. Changes of chronic pancreatitis. New cystic mass within the pancreatic head/uncinate process measuring up to 3.6 cm. Pancreatic ductal dilatation noted. Findings concerning for possible cystic pancreatic neoplasm. When the patient is clinically stable and able to follow directions and hold their breath (preferably as an outpatient) further evaluation with dedicated abdominal MRI should be considered. Electronically Signed   By: KRolm BaptiseM.D.   On: 02/02/2022 00:10    DISCHARGE EXAMINATION: Vitals:   02/06/22 1616 02/06/22 1935 02/07/22 0403 02/07/22 0747  BP: 127/70 128/74 (!) 149/80 132/69  Pulse: 84 84 71 73  Resp: '16 16 16 16  '$ Temp: 98.5 F (36.9 C) 97.7 F (36.5 C)  97.7 F (36.5 C)  TempSrc: Oral Oral    SpO2: 94% 97% 100% 100%  Weight:      Height:       General appearance: Awake alert.  In no distress Resp: Clear to auscultation bilaterally.  Normal effort Cardio: S1-S2 is normal regular.  No S3-S4.  No rubs murmurs or bruit GI: Abdomen is soft.  Nontender nondistended.  Bowel sounds are present normal.  No masses organomegaly    DISPOSITION: SNF  Discharge Instructions     Call MD for:  difficulty breathing, headache or visual disturbances   Complete by: As directed    Call MD for:  extreme fatigue   Complete by: As directed    Call MD for:  persistant dizziness or light-headedness   Complete by: As directed    Call MD for:  persistant nausea and vomiting   Complete by: As directed    Call MD for:  severe uncontrolled pain   Complete by: As directed    Call MD for:  temperature >100.4   Complete by: As directed    Diet - low sodium heart healthy   Complete by: As  directed    Diet Carb Modified   Complete by: As directed    Discharge instructions   Complete by: As directed    Please review instructions on the discharge  summary.  You were cared for by a hospitalist during your hospital stay. If you have any questions about your discharge medications or the care you received while you were in the hospital after you are discharged, you can call the unit and asked to speak with the hospitalist on call if the hospitalist that took care of you is not available. Once you are discharged, your primary care physician will handle any further medical issues. Please note that NO REFILLS for any discharge medications will be authorized once you are discharged, as it is imperative that you return to your primary care physician (or establish a relationship with a primary care physician if you do not have one) for your aftercare needs so that they can reassess your need for medications and monitor your lab values. If you do not have a primary care physician, you can call [phone] for a physician referral.   Increase activity slowly   Complete by: As directed    No wound care   Complete by: As directed          Allergies as of 02/07/2022       Reactions   Cat Hair Extract Shortness Of Breath, Swelling   Swelling, watery eyes        Medication List     STOP taking these medications    lisinopril-hydrochlorothiazide 20-12.5 MG tablet Commonly known as: ZESTORETIC       TAKE these medications    amLODipine 10 MG tablet Commonly known as: NORVASC Take 10 mg by mouth daily.   aspirin 81 MG EC tablet Take 1 tablet (81 mg total) by mouth daily. Swallow whole.   atorvastatin 20 MG tablet Commonly known as: LIPITOR Take 1 tablet (20 mg total) by mouth daily.   cephALEXin 500 MG capsule Commonly known as: KEFLEX Take 1 capsule (500 mg total) by mouth every 8 (eight) hours for 3 days.   clopidogrel 75 MG tablet Commonly known as: Plavix Take 1 tablet (75 mg total) by mouth daily for 17 days. LAST DOSE ON 02/23/2022 What changed: additional instructions   feeding supplement Liqd Take 237 mLs by mouth 3  (three) times daily between meals.   magnesium oxide 400 (240 Mg) MG tablet Commonly known as: MAG-OX Take 1 tablet (400 mg total) by mouth 2 (two) times daily.   metFORMIN 1000 MG tablet Commonly known as: GLUCOPHAGE Take 1 tablet (1,000 mg total) by mouth 2 (two) times daily with a meal.   metoprolol tartrate 100 MG tablet Commonly known as: LOPRESSOR Take 100 mg by mouth 2 (two) times daily.   mirtazapine 7.5 MG tablet Commonly known as: REMERON Take 1 tablet (7.5 mg total) by mouth at bedtime.   multivitamin with minerals Tabs tablet Take 1 tablet by mouth daily.   pantoprazole 40 MG tablet Commonly known as: PROTONIX Take 1 tablet (40 mg total) by mouth daily.   polyethylene glycol 17 g packet Commonly known as: MIRALAX / GLYCOLAX Take 17 g by mouth daily.   senna-docusate 8.6-50 MG tablet Commonly known as: Senokot-S Take 2 tablets by mouth at bedtime.          Follow-up Information     Isaac Bliss, Rayford Halsted, MD. Schedule an appointment as soon as possible for a visit in 3 week(s).  Specialty: Internal Medicine Contact information: [place] [address] [phone]         Gatha Mayer, MD Follow up.   Specialty: Gastroenterology Why: office will schedule f/u Contact information: [address] [place] [address] [phone]                 TOTAL DISCHARGE TIME: 35 minutes  [facility] Pager on [email or web address]  02/07/2022, 9:02 AM

## 2022-02-07 NOTE — TOC Progression Note (Signed)
Transition of Care (TOC) - Progression Note  ? ? ?Patient Details  ?Name: Kenneth [name] ?MRN: [id number] ?Date of Birth: [birth year] ? ?Transition of Care (TOC) CM/SW Contact  ?Emeterio Reeve, LCSW ?Phone Number: ?02/07/2022, 12:28 PM ? ?Clinical Narrative:    ? ?Pts insurance [name] is still pending.  ? ?Expected Discharge Plan: [place] ?Barriers to Discharge: Continued Medical Work up, Ship broker, Active Substance Use - Placement ? ?Expected Discharge Plan and Services ?Expected Discharge Plan: [place] ?In-house Referral: Clinical Social Work ?  ?  ?Living arrangements for the past 2 months: [place] ?Expected Discharge Date: 02/06/22               ?  ?  ?  ?  ?  ?  ?  ?  ?  ?  ? ? ?Social Determinants of Health (SDOH) Interventions ?  ? ?Readmission Risk Interventions ?No flowsheet data found. ? ?Emeterio Reeve, LCSW ?Clinical Social Worker ? ?

## 2022-02-08 ENCOUNTER — Other Ambulatory Visit: Payer: Self-pay

## 2022-02-08 DIAGNOSIS — K8689 Other specified diseases of pancreas: Secondary | ICD-10-CM

## 2022-02-08 LAB — GLUCOSE, CAPILLARY
Glucose-Capillary: 193 mg/dL — ABNORMAL HIGH (ref 70–99)
Glucose-Capillary: 282 mg/dL — ABNORMAL HIGH (ref 70–99)
Glucose-Capillary: 135 mg/dL — ABNORMAL HIGH (ref 70–99)
Glucose-Capillary: 215 mg/dL — ABNORMAL HIGH (ref 70–99)

## 2022-02-08 MED ORDER — METFORMIN HCL 500 MG PO TABS
1000.0000 mg | ORAL_TABLET | Freq: Two times a day (BID) | ORAL | Status: DC
  Administered 2022-02-08 – 2022-02-09 (×3): 1000 mg via ORAL
  Filled 2022-02-08 (×3): qty 2

## 2022-02-08 NOTE — Progress Notes (Addendum)
Occupational Therapy Treatment ?Patient Details ?Name: Kenneth [name] ?MRN: [id number] ?DOB: Feb 04, 1961 ?Today's Date: 02/08/2022 ? ? ?History of present illness 61 y.o. male admitted 3/1 with fatigue and GIB s/p EGD 3/3. PMHx: 01/24/22 CVA with left pontine & right thalamic infarct, melanoma with mets to brain and lung, DM, HTN, HLD, polyneuropathy, ETOH abuse ?  ?OT comments ? Patient with good progress toward patient focused goals.  Continued safety and processing deficits, but his mentation has improved, and he needing largely supervision for mobility with a RW, and ADL completion at a sit/stand level at bedside with supplies brought to him.  He remains very unsteady without a RW, and understands he needs one.  OT has dropped his frequency to 1x/wk, and SNF appears to be the plan for post acute rehab.  OT may want to consider pill box test for next session.    ? ?Recommendations for follow up therapy are one component of a multi-disciplinary discharge planning process, led by the attending physician.  Recommendations may be updated based on patient status, additional functional criteria and insurance authorization. ?   ?Follow Up Recommendations ? Skilled nursing-short term rehab (<3 hours/day)  ?  ?Assistance Recommended at Discharge Frequent or constant Supervision/Assistance  ?Patient can return home with the following ? A little help with walking and/or transfers;A little help with bathing/dressing/bathroom;Direct supervision/assist for medications management;Assist for transportation;Help with stairs or ramp for entrance ?  ?Equipment Recommendations ? Tub/shower bench  ?  ?Recommendations for Other Services   ? ?  ?Precautions / Restrictions Precautions ?Precautions: Fall ?Restrictions ?Weight Bearing Restrictions: No  ? ? ?  ? ?Mobility Bed Mobility ?Overal bed mobility: Modified Independent ?  ?  ?  ?  ?  ?  ?  ?  ? ?Transfers ?Overall transfer level: Needs assistance ?Equipment used: Rolling walker (2  wheels) ?  ?Sit to Stand: Supervision ?  ?  ?  ?  ?  ?  ?  ?  ?Balance Overall balance assessment: Needs assistance ?Sitting-balance support: Feet supported ?Sitting balance-Leahy Scale: Good ?  ?  ?Standing balance support: Reliant on assistive device for balance ?Standing balance-Leahy Scale: Fair ?Standing balance comment: able to stand at sink for grooming without holding onto RW ?  ?  ?  ?  ?  ?  ?  ?  ?  ?  ?  ?   ? ?ADL either performed or assessed with clinical judgement  ? ?ADL   ?  ?  ?Grooming: Supervision/safety;Standing ?  ?Upper Body Bathing: Set up;Sitting ?  ?Lower Body Bathing: Supervison/ safety;Sit to/from stand ?  ?  ?  ?Lower Body Dressing: Supervision/safety;Sit to/from stand ?  ?Toilet Transfer: Supervision/safety;Rolling walker (2 wheels) ?  ?  ?  ?  ?  ?  ?  ?  ? ?Extremity/Trunk Assessment Upper Extremity Assessment ?Upper Extremity Assessment: Overall WFL for tasks assessed ?  ?Lower Extremity Assessment ?Lower Extremity Assessment: Defer to PT evaluation ?  ?Cervical / Trunk Assessment ?Cervical / Trunk Assessment: Normal ?  ? ?Vision Baseline Vision/History: 1 Wears glasses ?Patient Visual Report: No change from baseline ?  ?  ?   ?  ?   ?  ? ?Cognition Arousal/Alertness: Awake/alert ?Behavior During Therapy: [facility] for tasks assessed/performed ?Overall Cognitive Status: Impaired/Different from baseline ?  ?  ?  ?  ?  ?  ?  ?  ?  ?  ?  ?  ?  ?Safety/Judgement: Decreased awareness of safety ?  ?  ?  ?  ?  ?   ?   ? ?  ?   ? ? ?  ?    ? ? ?  Pertinent Vitals/ Pain       Pain Assessment ?Pain Assessment: No/denies pain ?Pain Intervention(s): Monitored during session ? ?   ?  ?  ?  ?  ?  ?  ?  ?  ?  ?  ?  ?  ?  ?  ?  ?  ?  ?  ? ?  ?    ?  ?  ?  ?   ? ?Frequency ? Min 1X/week  ? ? ? ? ?  ?Progress Toward Goals ? ?OT Goals(current goals can now be found in the care plan section) ? Progress towards OT goals: Progressing toward goals ? ?Acute Rehab OT Goals ?Patient Stated Goal: Unsure ?OT Goal  Formulation: With patient ?Time For Goal Achievement: 02/17/22 ?Potential to Achieve Goals: Good  ?Plan Discharge plan remains appropriate   ? ?Co-evaluation ? ? ?   ?  ?  ?  ?  ? ?  ?AM-PAC OT "6 Clicks" Daily Activity     ?Outcome Measure ? ? Help from another person eating meals?: None ?Help from another person taking care of personal grooming?: None ?Help from another person toileting, which includes using toliet, bedpan, or urinal?: A Little ?Help from another person bathing (including washing, rinsing, drying)?: A Little ?Help from another person to put on and taking off regular upper body clothing?: A Little ?Help from another person to put on and taking off regular lower body clothing?: A Little ?6 Click Score: 20 ? ?  ?End of Session Equipment Utilized During Treatment: Rolling walker (2 wheels) ? ?OT Visit Diagnosis: Unsteadiness on feet (R26.81);Other abnormalities of gait and mobility (R26.89);History of falling (Z91.81) ?  ?Activity Tolerance Patient tolerated treatment well ?  ?Patient Left in bed;with call bell/phone within reach ?  ?Nurse Communication Mobility status ?  ? ?   ? ?Time: 6644-0347 ?OT Time Calculation (min): 19 min ? ?Charges: OT General Charges ?$OT Visit: 1 Visit ?OT Treatments ?$Self Care/Home Management : 8-22 mins ? ?02/08/2022 ? ?RP, OTR/L ? ?Acute Rehabilitation Services ? ?Office:  [phone] ? ? ?Govind Furey D Cinsere Mizrahi ?02/08/2022, 5:24 PM ?

## 2022-02-08 NOTE — Progress Notes (Signed)
Mobility Specialist Progress Note: ? ? 02/08/22 1200  ?Mobility  ?Activity Ambulated with assistance in hallway  ?Level of Assistance Contact guard assist, steadying assist  ?Assistive Device Front wheel walker  ?Distance Ambulated (ft) 550 ft  ?Activity Response Tolerated well  ?$Mobility charge 1 Mobility  ? ?Pt received in bed willing to participate in mobility. No complaints of pain. Pt left in bed with call bell in reach and all needs met.  ? ?Kenneth [name] ?Mobility Specialist ?Primary Phone [phone] ? ?

## 2022-02-08 NOTE — Progress Notes (Signed)
PROGRESS NOTE  Kenneth [name]  DOB: 10-19-1961  PCP: Isaac Bliss, Rayford Halsted, MD NID:[id number]  DOA: 02/01/2022  LOS: 7 days  Hospital Day: 8  Brief narrative: Kenneth [name] is a 61 y.o. male with PMH significant for DM2, HTN, HLD, CVA, chronic alcoholism, chronic pancreatitis, pancreatic pseudocyst, polyneuropathy, metastatic melanoma to lung and brain (currently in remission, for past 7 years) who was recently admitted for CVA 2/21 to 2/23 and discharged for outpatient PT. Patient presented to the ED on 3/1 with significant fatigue, poor oral intake, weight loss. He was found to have FOBT positive stool. Admitted to hospitalist service Seen by gastroenterology.   Underwent EGD and colonoscopy on 3/3.   Currently pending SNF.   Subjective: Patient was seen and examined this morning.  Pleasant middle-aged Caucasian male.  Sitting up in chair.  Not in distress.  No new symptoms.  Principal Problem:   Failure to thrive in adult Active Problems:   Occult GI bleeding   Gastritis and gastroduodenitis   Pancreatic cyst - mass   Urinary tract infection   CVA (cerebral vascular accident) ([place])   Diabetes mellitus without complication ([place])   Essential hypertension   Normocytic anemia   Metastatic melanoma to lung ([place])   Dyslipidemia   DNR (do not resuscitate)   Pressure injury of skin   Protein-calorie malnutrition, severe   Assessment and Plan: Occult GI bleeding -FOBT positive, downtrending hemoglobin  -GI consult appreciated.  Underwent EGD and colonoscopy on 02/03/2022 -EGD showed erythema in the esophagus, gastritis and duodenal erosions.  Biopsies sent. -Currently on PPI daily.  Plavix resumed. -Colonoscopy showed diverticulosis in the sigmoid colon, external and internal hemorrhoids. -Continue to monitor hemoglobin.  Repeat labs tomorrow. Recent Labs    11/11/21 1600 01/24/22 1636 01/24/22 1747 01/24/22 2200 02/01/22 1049 02/02/22 0649 02/02/22 0830  02/03/22 0131 02/04/22 0114 02/06/22 0043  HGB 16.6   < >  --    < > 11.5*  --  11.5* 11.3* 11.0* 9.9*  MCV 92.1   < >  --    < > 90.0  --  91.8 91.0 90.9 89.8  VITAMINB12 243  --  231  --   --   --   --   --   --   --   FOLATE  --   --  10.7  --   --   --   --   --   --   --   FERRITIN  --   --   --   --   --  372*  --   --   --   --   TIBC  --   --   --   --   --  203*  --   --   --   --   IRON  --   --   --   --   --  45  --   --   --   --   RETICCTPCT  --   --   --   --   --   --  1.1  --   --   --    < > = values in this interval not displayed.   Chronic pancreatitis Pancreatic cyst/mass -CT abdomen showed chronic pancreatitis as well as a new cystic mass within the pancreatic head measuring up to 3.6 cm with pericardiac ductal dilatation. -GI consult obtained.  CA 19-9 level was elevated. -GI recommend MRI/MRCP as an  outpatient.  To follow-up with GI as an outpatient  Failure to thrive in adult -Significant weight loss, poor oral intake.  -Seen by PT.  SNF recommended.   -Started on Remeron.  Appetite seems to be improving.  E. coli urinary tract infection -Reported urinary frequency.  Abnormal urinalysis.  -Patient was initially on IV Rocephin.  Subsequently changed to oral Keflex.  To complete the course today 3/8.  AKI -Baseline creatinine less than 1.  Presented with a creatinine of 1.27.  Improved with IV hydration. Recent Labs    01/25/22 0235 01/25/22 1053 01/25/22 1418 01/25/22 1942 02/01/22 1049 02/02/22 0649 02/03/22 0131 02/04/22 0114 02/06/22 0043 02/07/22 0104  BUN '17 16 15 14 '$ 45* 27* '15 10 18 '$ 21*  CREATININE 1.38* 1.46* 1.39* 1.36* 1.27* 1.00 0.85 0.96 0.87 0.85   CVA (cerebral vascular accident)  Hyperlipidemia -Recent stroke in February.  At that time he was discharged home with outpatient therapy.  Supposed to be on aspirin and Plavix but had not filled his Plavix prescription yet.   -Cleared by GI to resume his antiplatelet agents.  Currently  on aspirin and Plavix for 3 weeks -followed by aspirin alone. -Continue Lipitor.  Type 2 diabetes mellitus -A1c 6.6 on 01/2022 -Home meds include metformin, currently on hold -Currently on sliding scale insulin with Accu-Cheks.  Blood sugar level running consistently elevated over 200.  Creatinine has improved.  I will resume metformin today. Recent Labs  Lab 02/07/22 1605 02/07/22 2043 02/08/22 0809 02/08/22 1127 02/08/22 1625  GLUCAP 261* 263* 193* 282* 135*   Essential hypertension -Blood pressure is currently controlled on amlodipine and metoprolol.  Zestoretic has been discontinued for now.  Monitor blood pressures closely  Metastatic melanoma to lung  He is in remission for 7 years.  Follow[facility] at [facility].  Recent MRI brain did not show any metastatic process.  Goals of care   Code Status: DNR   Mobility: PT eval obtained.  SNF recommended.  Nutritional status:  Body mass index is 20.85 kg/m.  Nutrition Problem: Severe Malnutrition (in the context of social/environmental circumstances) Etiology: poor appetite Signs/Symptoms: severe muscle depletion, severe fat depletion, percent weight loss (15.9% x 3 months) Percent weight loss: 15.9 %     Diet:  Diet Order             Diet - low sodium heart healthy           Diet Carb Modified           Diet Carb Modified Fluid consistency: Thin; Room service appropriate? Yes  Diet effective now                   DVT prophylaxis:  SCDs Start: 02/01/22 1720   Antimicrobials: Keflex to complete today Fluid: None Consultants: GI Family Communication: None at bedside  Status is: Inpatient  Continue in-hospital care because: Pending SNF Level of care: Med-Surg   Dispo: The patient is from: Home              Anticipated d/c is to: SNF              Patient currently is medically stable to d/c.   Difficult to place patient No     Infusions:    Scheduled Meds:  amLODipine  10 mg Oral Daily    aspirin EC  81 mg Oral Daily   atorvastatin  20 mg Oral Daily   cephALEXin  500 mg Oral Q8H   clopidogrel  75 mg Oral Daily   feeding supplement  237 mL Oral TID BM   insulin aspart  0-5 Units Subcutaneous QHS   insulin aspart  0-9 Units Subcutaneous TID WC   magnesium oxide  400 mg Oral BID   metFORMIN  1,000 mg Oral BID WC   metoprolol tartrate  100 mg Oral BID   mirtazapine  7.5 mg Oral QHS   multivitamin with minerals  1 tablet Oral Daily   pantoprazole  40 mg Oral QAC breakfast   polyethylene glycol  17 g Oral Daily   senna-docusate  2 tablet Oral QHS   sodium chloride flush  3 mL Intravenous Q12H    PRN meds: acetaminophen **OR** acetaminophen, hydrALAZINE, ondansetron **OR** ondansetron (ZOFRAN) IV   Antimicrobials: Anti-infectives (From admission, onward)    Start     Dose/Rate Route Frequency Ordered Stop   02/06/22 0000  cephALEXin (KEFLEX) 500 MG capsule        500 mg Oral Every 8 hours 02/06/22 0932 02/09/22 2359   02/04/22 1400  cephALEXin (KEFLEX) capsule 500 mg        500 mg Oral Every 8 hours 02/04/22 1035 02/09/22 1359   02/02/22 1000  cefTRIAXone (ROCEPHIN) 1 g in sodium chloride 0.9 % 100 mL IVPB  Status:  Discontinued        1 g 200 mL/hr over 30 Minutes Intravenous Every 24 hours 02/02/22 0924 02/04/22 1035       Objective: Vitals:   02/08/22 0738 02/08/22 1558  BP: (!) 146/72 121/80  Pulse: 71 66  Resp: 16 16  Temp: 98.2 F (36.8 C) 99.4 F (37.4 C)  SpO2: 100% 100%    Intake/Output Summary (Last 24 hours) at 02/08/2022 1652 Last data filed at 02/08/2022 0428 Gross per 24 hour  Intake --  Output 200 ml  Net -200 ml   Filed Weights   02/02/22 1657  Weight: 55.1 kg   Weight change:  Body mass index is 20.85 kg/m.   Physical Exam: General exam: Pleasant, middle-aged.  Not in distress Skin: No rashes, lesions or ulcers. HEENT: Atraumatic, normocephalic, no obvious bleeding Lungs: Clear to auscultation bilaterally CVS: Regular rate and  rhythm, no murmur GI/Abd soft, mild epigastric tenderness, nondistended, bowel sound present CNS: Alert, awake, oriented x3 Psychiatry: Mood appropriate Extremities: No pedal edema, no calf tenderness  Data Review: I have personally reviewed the laboratory data and studies available.  F/u labs ordered Unresulted Labs (From admission, onward)     Start     Ordered   Unscheduled  CBC with Differential/Platelet  Tomorrow morning,   R        02/08/22 1652   Unscheduled  Basic metabolic panel  Tomorrow morning,   R        02/08/22 1652            Signed, Terrilee Croak, MD [facility] 02/08/2022

## 2022-02-08 NOTE — TOC Progression Note (Signed)
Transition of Care (TOC) - Progression Note  ? ? ?Patient Details  ?Name: Kenneth [name] ?MRN: [id number] ?Date of Birth: January 18, 1961 ? ?Transition of Care (TOC) CM/SW Contact  ?Emeterio Reeve, LCSW ?Phone Number: ?02/08/2022, 1:03 PM ? ?Clinical Narrative:    ? ?Pts insurance [name] is still pending. CSW spoke to pts sister [name] who had concerns about pt getting disability and low cost disability housing. CSW explained that pt will have to apply for disability on the social security administration website. CSW also explained that the hospital unfortunately does not have the resources to assist with housing, they would have to reach out to [facility] to be placed on waitlist. [name] stated the current plan if for pt to come live with her after discharge.  ? ?Expected Discharge Plan: [place] ?Barriers to Discharge: Continued Medical Work up, Ship broker, Active Substance Use - Placement ? ?Expected Discharge Plan and Services ?Expected Discharge Plan: [place] ?In-house Referral: Clinical Social Work ?  ?  ?Living arrangements for the past 2 months: [place] ?Expected Discharge Date: 02/06/22               ?  ?  ?  ?  ?  ?  ?  ?  ?  ?  ? ? ?Social Determinants of Health (SDOH) Interventions ?  ? ?Readmission Risk Interventions ?No flowsheet data found. ? ?Emeterio Reeve, LCSW ?Clinical Social Worker ? ?

## 2022-02-09 ENCOUNTER — Other Ambulatory Visit: Payer: Self-pay | Admitting: Internal Medicine

## 2022-02-09 DIAGNOSIS — E119 Type 2 diabetes mellitus without complications: Secondary | ICD-10-CM | POA: Diagnosis not present

## 2022-02-09 DIAGNOSIS — M6281 Muscle weakness (generalized): Secondary | ICD-10-CM | POA: Diagnosis not present

## 2022-02-09 DIAGNOSIS — K2990 Gastroduodenitis, unspecified, without bleeding: Secondary | ICD-10-CM | POA: Diagnosis not present

## 2022-02-09 DIAGNOSIS — R262 Difficulty in walking, not elsewhere classified: Secondary | ICD-10-CM | POA: Diagnosis not present

## 2022-02-09 DIAGNOSIS — R41841 Cognitive communication deficit: Secondary | ICD-10-CM | POA: Diagnosis not present

## 2022-02-09 DIAGNOSIS — Z7401 Bed confinement status: Secondary | ICD-10-CM | POA: Diagnosis not present

## 2022-02-09 DIAGNOSIS — R531 Weakness: Secondary | ICD-10-CM | POA: Diagnosis not present

## 2022-02-09 DIAGNOSIS — I10 Essential (primary) hypertension: Secondary | ICD-10-CM

## 2022-02-09 DIAGNOSIS — R195 Other fecal abnormalities: Secondary | ICD-10-CM | POA: Diagnosis not present

## 2022-02-09 DIAGNOSIS — K862 Cyst of pancreas: Secondary | ICD-10-CM | POA: Diagnosis not present

## 2022-02-09 DIAGNOSIS — E43 Unspecified severe protein-calorie malnutrition: Secondary | ICD-10-CM | POA: Diagnosis not present

## 2022-02-09 DIAGNOSIS — R972 Elevated prostate specific antigen [PSA]: Secondary | ICD-10-CM | POA: Diagnosis not present

## 2022-02-09 DIAGNOSIS — R279 Unspecified lack of coordination: Secondary | ICD-10-CM | POA: Diagnosis not present

## 2022-02-09 DIAGNOSIS — R627 Adult failure to thrive: Secondary | ICD-10-CM | POA: Diagnosis not present

## 2022-02-09 DIAGNOSIS — R2689 Other abnormalities of gait and mobility: Secondary | ICD-10-CM | POA: Diagnosis not present

## 2022-02-09 DIAGNOSIS — I639 Cerebral infarction, unspecified: Secondary | ICD-10-CM | POA: Diagnosis not present

## 2022-02-09 DIAGNOSIS — R1312 Dysphagia, oropharyngeal phase: Secondary | ICD-10-CM | POA: Diagnosis not present

## 2022-02-09 DIAGNOSIS — K2970 Gastritis, unspecified, without bleeding: Secondary | ICD-10-CM | POA: Diagnosis not present

## 2022-02-09 DIAGNOSIS — N390 Urinary tract infection, site not specified: Secondary | ICD-10-CM | POA: Diagnosis not present

## 2022-02-09 LAB — BASIC METABOLIC PANEL
Anion gap: 8 (ref 5–15)
BUN: 18 mg/dL (ref 6–20)
CO2: 25 mmol/L (ref 22–32)
Calcium: 8.7 mg/dL — ABNORMAL LOW (ref 8.9–10.3)
Chloride: 108 mmol/L (ref 98–111)
Creatinine, Ser: 0.84 mg/dL (ref 0.61–1.24)
GFR, Estimated: >60 mL/min (ref >60)
Glucose, Bld: 165 mg/dL — ABNORMAL HIGH (ref 70–99)
Potassium: 4.1 mmol/L (ref 3.5–5.1)
Sodium: 141 mmol/L (ref 135–145)

## 2022-02-09 LAB — CBC WITH DIFFERENTIAL/PLATELET
Abs Immature Granulocytes: 0.05 10*3/uL (ref 0.00–0.07)
Basophils Absolute: 0.1 10*3/uL (ref 0.0–0.1)
Basophils Relative: 1 %
Eosinophils Absolute: 0.4 10*3/uL (ref 0.0–0.5)
Eosinophils Relative: 6 %
HCT: 28.9 % — ABNORMAL LOW (ref 39.0–52.0)
Hemoglobin: 9.9 g/dL — ABNORMAL LOW (ref 13.0–17.0)
Immature Granulocytes: 1 %
Lymphocytes Relative: 22 %
Lymphs Abs: 1.4 10*3/uL (ref 0.7–4.0)
MCH: 31.4 pg (ref 26.0–34.0)
MCHC: 34.3 g/dL (ref 30.0–36.0)
MCV: 91.7 fL (ref 80.0–100.0)
Monocytes Absolute: 0.9 10*3/uL (ref 0.1–1.0)
Monocytes Relative: 14 %
Neutro Abs: 3.6 10*3/uL (ref 1.7–7.7)
Neutrophils Relative %: 56 %
Platelets: 445 10*3/uL — ABNORMAL HIGH (ref 150–400)
RBC: 3.15 MIL/uL — ABNORMAL LOW (ref 4.22–5.81)
RDW: 12.3 % (ref 11.5–15.5)
WBC: 6.4 10*3/uL (ref 4.0–10.5)
nRBC: 0 % (ref 0.0–0.2)

## 2022-02-09 LAB — GLUCOSE, CAPILLARY
Glucose-Capillary: 137 mg/dL — ABNORMAL HIGH (ref 70–99)
Glucose-Capillary: 228 mg/dL — ABNORMAL HIGH (ref 70–99)
Glucose-Capillary: 197 mg/dL — ABNORMAL HIGH (ref 70–99)
Glucose-Capillary: 213 mg/dL — ABNORMAL HIGH (ref 70–99)

## 2022-02-09 NOTE — Progress Notes (Signed)
Nutrition Follow-up ? ?DOCUMENTATION CODES:  ?Severe malnutrition in context of social or environmental circumstances ? ?INTERVENTION:  ?Continue current diet as ordered, encourage PO intake ?Request new weight ?Continue Ensure Enlive po TID, each supplement provides 350 kcal and 20 grams of protein. ?Continue MVI with minerals ? ?NUTRITION DIAGNOSIS:  ?Severe Malnutrition (in the context of social/environmental circumstances) related to poor appetite as evidenced by severe muscle depletion, severe fat depletion, percent weight loss (15.9% x 3 months). ?- remains valid ? ?GOAL:  ?Patient will meet greater than or equal to 90% of their needs ?- appetite improving, supplements in place ? ?MONITOR:  ?PO intake, Supplement acceptance, Weight trends ? ?REASON FOR ASSESSMENT:  ?Malnutrition Screening Tool ?  ? ?ASSESSMENT:  ?61 y.o. male with history of HTN, HLD, hx CVA, DM type 2, hx melanoma with mets to lung and brain 2013, and ETOH abuse presented to ED after recent discharge for FTT at home. ? ?Pt resting in bed at the time of assessment, recently finished working with therapy. Pt reports an increasing appetite and intake over the past week. States he is drinking all ensures that are given to him and that his sisters typically will bring dinner in for him from outside the hospital. Energy levels are improving. ? ?Will continue current nutrition plan at this time as it is being well tolerated. New bed weight obtained and entered as last was from 3/3. Trending up, so gain likely due to correction in dehydration present on admission.  ? ?3/3 - EGD/Colonoscopy ?EGD Findings - Erythema in the esophagus, Gastritis, Duodenal erosions. ?Colonoscopy Findings - Diverticulosis and internal/external hemorrhoids  ? ?Average Meal Intake: ?3/4-3/9: 68% intake x 7 recorded meals ? ?Nutritionally Relevant Medications: ?Scheduled Meds: ? atorvastatin  20 mg Oral Daily  ? feeding supplement  237 mL Oral TID BM  ? insulin aspart  0-5  Units Subcutaneous QHS  ? insulin aspart  0-9 Units Subcutaneous TID WC  ? magnesium oxide  400 mg Oral BID  ? metFORMIN  1,000 mg Oral BID WC  ? mirtazapine  7.5 mg Oral QHS  ? multivitamin with minerals  1 tablet Oral Daily  ? pantoprazole  40 mg Oral QAC breakfast  ? polyethylene glycol  17 g Oral Daily  ? senna-docusate  2 tablet Oral QHS  ? ?PRN Meds: ondansetron  ? ?Labs Reviewed: ?SBG ranges from 135-282 mg/dL over the last 24 hours ?HgbA1c 6.6% (2/21) ? ?NUTRITION - FOCUSED PHYSICAL EXAM: ?Flowsheet Row Most Recent Value  ?Orbital Region Moderate depletion  ?Upper Arm Region Severe depletion  ?Thoracic and Lumbar Region Severe depletion  ?Buccal Region Moderate depletion  ?Temple Region Moderate depletion  ?Clavicle Bone Region Severe depletion  ?Clavicle and Acromion Bone Region Severe depletion  ?Scapular Bone Region Severe depletion  ?Dorsal Hand Mild depletion  ?Patellar Region Severe depletion  ?Anterior Thigh Region Severe depletion  ?Posterior Calf Region Severe depletion  ?Edema (RD Assessment) None  ?Hair Reviewed  ?Eyes Reviewed  ?Mouth Reviewed  ?Skin Reviewed  [scattered bruising, redness to bilateral knees]  ?Nails Reviewed  ? ?Diet Order:   ?Diet Order   ? ?       ?  Diet - low sodium heart healthy       ?  ?  Diet Carb Modified       ?  ?  Diet Carb Modified Fluid consistency: Thin; Room service appropriate? Yes  Diet effective now       ?  ? ?  ?  ? ?  ? ? ?  EDUCATION NEEDS:  ?Education needs have been addressed ? ?Skin:  Skin Assessment: Skin Integrity Issues: ?Skin Integrity Issues:: Stage II ?Stage II: coccyx ? ?Last BM:  3/3 - type 7 ? ?Height:  ?Ht Readings from Last 1 Encounters:  ?02/02/22 '5\' 4"'$  (1.626 m)  ? ? ?Weight:  ?Wt Readings from Last 1 Encounters:  ?02/02/22 55.1 kg  ? ? ?Ideal Body Weight:  59.1 kg ? ?BMI:  Body mass index is 20.85 kg/m?. ? ?Estimated Nutritional Needs:  ?Kcal:  1700-1900 kcal/d ?Protein:  85-100 g/d ?Fluid:  1.8-2L/d ? ? ?Ranell Patrick, RD, LDN ?Clinical  Dietitian ?RD pager # available in [place]  ?After hours/weekend pager # available in [place] ?

## 2022-02-09 NOTE — Progress Notes (Signed)
Discharged per PTAR.  ?

## 2022-02-09 NOTE — TOC Transition Note (Signed)
Transition of Care (TOC) - CM/SW Discharge Note ? ? ?Patient Details  ?Name: Kenneth [name] ?MRN: [id number] ?Date of Birth: March 07, 1961 ? ?Transition of Care (TOC) CM/SW Contact:  ?Emeterio Reeve, LCSW ?Phone Number: ?02/09/2022, 2:08 PM ? ? ?Clinical Narrative:    ? ?Per MD patient ready for DC to [facility]. RN, patient, patient's family, and facility notified of DC. Discharge Summary and FL2 sent to facility. DC packet on chart. Insurance [name] has been received. Ambulance transport requested for patient.  ?  ?RN to call report to [phone] ? ?CSW will sign off for now as social work intervention is no longer needed. Please consult [place] again if new needs arise. ? ? ?Final next level of care: [place] ?Barriers to Discharge: Barriers Resolved ? ? ?Patient Goals and CMS Choice ?Patient states their goals for this hospitalization and ongoing recovery are:: Get stronger ?CMS Medicare.gov Compare Post Acute Care list provided to:: Other (Comment Required) (Sister, [name]) ?Choice offered to / list presented to : Sibling ? ?Discharge Placement ?  ?           ?Patient chooses bed at: Other - please specify in the comment section below: ([place]) ?Patient to be transferred to facility by: ptar ?Name of family member notified: sister ?Patient and family notified of of transfer: 02/09/22 ? ?Discharge Plan and Services ?In-house Referral: Clinical Social Work ?  ?           ?  ?  ?  ?  ?  ?  ?  ?  ?  ?  ? ?Social Determinants of Health (SDOH) Interventions ?  ? ? ?Readmission Risk Interventions ?No flowsheet data found. ? ? ?Emeterio Reeve, LCSW ?Clinical Social Worker ? ? ?

## 2022-02-09 NOTE — Progress Notes (Signed)
Mobility Specialist Progress Note: ? ? 02/09/22 0953  ?Mobility  ?Activity Ambulated with assistance in hallway  ?Level of Assistance Standby assist, set-up cues, supervision of patient - no hands on  ?Assistive Device Front wheel walker  ?Distance Ambulated (ft) 550 ft  ?Activity Response Tolerated well  ?$Mobility charge 1 Mobility  ? ?Pt received in bed willing to participate in mobility. No complaints of pain. Pt left in chair with call bell in reach and all needs met.  ? ?Kenneth [name] ?Mobility Specialist ?Primary Phone [phone] ? ?

## 2022-02-09 NOTE — Discharge Summary (Signed)
Physician Discharge Summary  Kenneth [name] ZSW:[id number] DOB: June 21, 1961 DOA: 02/01/2022  PCP: Isaac Bliss, Rayford Halsted, MD  Admit date: 02/01/2022 Discharge date: 02/09/2022  Admitted From: home Discharge disposition: snf  RECOMMENDATIONS FOR OUTPATIENT FOLLOW UP: [facility] will arrange outpatient follow-up. They will arrange outpatient MRI for pancreatic cyst Plavix to end on 02/23/2022 and then he should take aspirin alone daily  Brief narrative: Kenneth [name] is a 61 y.o. male with PMH significant for DM2, HTN, HLD, CVA, chronic alcoholism, chronic pancreatitis, pancreatic pseudocyst, polyneuropathy, metastatic melanoma to lung and brain (currently in remission, for past 7 years) who was recently admitted for CVA 2/21 to 2/23 and discharged for outpatient PT. Patient presented to the ED on 3/1 with significant fatigue, poor oral intake, weight loss. He was found to have FOBT positive stool. Admitted to hospitalist service Seen by gastroenterology.   Underwent EGD and colonoscopy on 3/3.    Subjective: Patient was seen and examined this morning.  Pleasant middle-aged Caucasian male.  Sitting up in chair.  Not in distress.  No new symptoms.  Principal Problem:   Failure to thrive in adult Active Problems:   Occult GI bleeding   Gastritis and gastroduodenitis   Pancreatic cyst - mass   Urinary tract infection   CVA (cerebral vascular accident) ([place])   Diabetes mellitus without complication ([place])   Essential hypertension   Normocytic anemia   Metastatic melanoma to lung ([place])   Dyslipidemia   DNR (do not resuscitate)   Pressure injury of skin   Protein-calorie malnutrition, severe   Hospital course Occult GI bleeding -FOBT positive, downtrending hemoglobin  -GI consult appreciated.  Underwent EGD and colonoscopy on 02/03/2022 -EGD showed erythema in the esophagus, gastritis and duodenal erosions.  Biopsies sent. -Currently on PPI daily.  Plavix  resumed. -Colonoscopy showed diverticulosis in the sigmoid colon, external and internal hemorrhoids. -Hemoglobin stable. Recent Labs    11/11/21 1600 01/24/22 1636 01/24/22 1747 01/24/22 2200 02/02/22 0649 02/02/22 0830 02/03/22 0131 02/04/22 0114 02/06/22 0043 02/09/22 0340  HGB 16.6   < >  --    < >  --  11.5* 11.3* 11.0* 9.9* 9.9*  MCV 92.1   < >  --    < >  --  91.8 91.0 90.9 89.8 91.7  VITAMINB12 243  --  231  --   --   --   --   --   --   --   FOLATE  --   --  10.7  --   --   --   --   --   --   --   FERRITIN  --   --   --   --  372*  --   --   --   --   --   TIBC  --   --   --   --  203*  --   --   --   --   --   IRON  --   --   --   --  45  --   --   --   --   --   RETICCTPCT  --   --   --   --   --  1.1  --   --   --   --    < > = values in this interval not displayed.   Chronic pancreatitis Pancreatic cyst/mass -CT abdomen showed chronic pancreatitis as well as a new cystic  mass within the pancreatic head measuring up to 3.6 cm with pericardiac ductal dilatation. -GI consult obtained.  CA 19-9 level was elevated. -GI recommend MRI/MRCP as an outpatient.  To follow-up with GI as an outpatient  Hypomagnesemia -Persistently low magnesium level.  Currently on scheduled magnesium.  Continue same at discharge. Recent Labs  Lab 02/03/22 0131 02/04/22 0114 02/04/22 0717 02/06/22 0043 02/07/22 0104 02/09/22 0340  K 3.5 3.7  --  3.5 3.7 4.1  MG  --  1.6*  --  1.3* 1.4*  --   PHOS  --   --  2.8  --   --   --    Failure to thrive in adult -Significant weight loss, poor oral intake.  -Seen by PT.  SNF recommended.   -Started on Remeron.  Appetite seems to be improving.  E. coli urinary tract infection -Reported urinary frequency.  Abnormal urinalysis.  -Patient was initially on IV Rocephin.  Subsequently changed to oral Keflex.  Completed a course of Keflex on 3/8.  AKI -Baseline creatinine less than 1.  Presented with a creatinine of 1.27.  Improved with IV  hydration. Recent Labs    01/25/22 1053 01/25/22 1418 01/25/22 1942 02/01/22 1049 02/02/22 0649 02/03/22 0131 02/04/22 0114 02/06/22 0043 02/07/22 0104 02/09/22 0340  BUN '16 15 14 '$ 45* 27* '15 10 18 '$ 21* 18  CREATININE 1.46* 1.39* 1.36* 1.27* 1.00 0.85 0.96 0.87 0.85 0.84   CVA (cerebral vascular accident)  Hyperlipidemia -Recent stroke in February.  At that time he was discharged home with outpatient therapy.  Supposed to be on aspirin and Plavix but had not filled his Plavix prescription yet.   -Cleared by GI to resume his antiplatelet agents.  Currently on aspirin and Plavix for 3 weeks (till 02/23/2022) -followed by aspirin alone. -Continue Lipitor.  Type 2 diabetes mellitus -A1c 6.6 on 01/2022 -Home meds include metformin. -Currently on sliding scale insulin with Accu-Cheks.  Blood sugar level running consistently elevated over 200.  Creatinine has improved.  Metformin has been resumed. Recent Labs  Lab 02/08/22 1127 02/08/22 1625 02/08/22 2041 02/09/22 0843 02/09/22 1143  GLUCAP 282* 135* 215* 137* 228*   Essential hypertension -Blood pressure is currently controlled on amlodipine and metoprolol.  Zestoretic has been discontinued for now.  Monitor blood pressures closely  Metastatic melanoma to lung  He is in remission for 7 years.  Followed at [facility].  Recent MRI brain did not show any metastatic process.  Goals of care   Code Status: DNR   Mobility: PT eval obtained.  SNF recommended.  Nutritional status:  Body mass index is 21.65 kg/m.  Nutrition Problem: Severe Malnutrition (in the context of social/environmental circumstances) Etiology: poor appetite Signs/Symptoms: severe muscle depletion, severe fat depletion, percent weight loss (15.9% x 3 months) Percent weight loss: 15.9 %      Wounds: - Pressure Injury 02/02/22 Coccyx Stage 2 -  Partial thickness loss of dermis presenting as a shallow open injury with a red, pink wound bed without  slough. (Active)  Date First Assessed/Time First Assessed: 02/02/22 1700   Location: Coccyx  Staging: Stage 2 -  Partial thickness loss of dermis presenting as a shallow open injury with a red, pink wound bed without slough.    Assessments 02/02/2022  5:52 PM 02/09/2022 12:13 AM  Dressing Type Foam - Lift dressing to assess site every shift Foam - Lift dressing to assess site every shift  Dressing -- Clean, Dry, Intact     No  Linked orders to display    Discharge Exam:   Vitals:   02/08/22 2036 02/09/22 0606 02/09/22 0842 02/09/22 0854  BP: 127/73 139/76 135/69   Pulse: 76 66 67   Resp: '17 17 16   '$ Temp: 98.1 F (36.7 C) 98.6 F (37 C) 98.1 F (36.7 C)   TempSrc: Oral Oral Oral   SpO2: 100% 100% 100%   Weight:    57.2 kg  Height:        Body mass index is 21.65 kg/m.  General exam: Pleasant, middle-aged.  Not in distress Skin: No rashes, lesions or ulcers. HEENT: Atraumatic, normocephalic, no obvious bleeding Lungs: Clear to auscultation bilaterally CVS: Regular rate and rhythm, no murmur GI/Abd soft, mild epigastric tenderness, nondistended, bowel sound present CNS: Alert, awake, oriented x3 Psychiatry: Mood appropriate Extremities: No pedal edema, no calf tenderness  Follow ups:    Follow-up Information     Isaac Bliss, Rayford Halsted, MD. Schedule an appointment as soon as possible for a visit in 3 week(s).   Specialty: Internal Medicine Contact information: [place] [address] [phone]         Gatha Mayer, MD Follow up.   Specialty: Gastroenterology Why: office will schedule f/u Contact information: [address] [address] [phone]         Isaac Bliss, Rayford Halsted, MD Follow up.   Specialty: Internal Medicine Contact information: [place] [address] [phone]                 Discharge Instructions:   Discharge Instructions     Call MD for:  difficulty  breathing, headache or visual disturbances   Complete by: As directed    Call MD for:  extreme fatigue   Complete by: As directed    Call MD for:  persistant dizziness or light-headedness   Complete by: As directed    Call MD for:  persistant nausea and vomiting   Complete by: As directed    Call MD for:  severe uncontrolled pain   Complete by: As directed    Call MD for:  temperature >100.4   Complete by: As directed    Diet - low sodium heart healthy   Complete by: As directed    Diet Carb Modified   Complete by: As directed    Discharge instructions   Complete by: As directed    Please review instructions on the discharge summary.  You were cared for by a hospitalist during your hospital stay. If you have any questions about your discharge medications or the care you received while you were in the hospital after you are discharged, you can call the unit and asked to speak with the hospitalist on call if the hospitalist that took care of you is not available. Once you are discharged, your primary care physician will handle any further medical issues. Please note that NO REFILLS for any discharge medications will be authorized once you are discharged, as it is imperative that you return to your primary care physician (or establish a relationship with a primary care physician if you do not have one) for your aftercare needs so that they can reassess your need for medications and monitor your lab values. If you do not have a primary care physician, you can call [phone] for a physician referral.   Increase activity slowly   Complete by: As directed    No wound care   Complete by: As  directed        Discharge Medications:   Allergies as of 02/09/2022       Reactions   Cat Hair Extract Shortness Of Breath, Swelling   Swelling, watery eyes        Medication List     STOP taking these medications    lisinopril-hydrochlorothiazide 20-12.5 MG tablet Commonly known as:  ZESTORETIC       TAKE these medications    amLODipine 10 MG tablet Commonly known as: NORVASC Take 10 mg by mouth daily.   aspirin 81 MG EC tablet Take 1 tablet (81 mg total) by mouth daily. Swallow whole.   atorvastatin 20 MG tablet Commonly known as: LIPITOR Take 1 tablet (20 mg total) by mouth daily.   clopidogrel 75 MG tablet Commonly known as: Plavix Take 1 tablet (75 mg total) by mouth daily for 17 days. LAST DOSE ON 02/23/2022 What changed: additional instructions   feeding supplement Liqd Take 237 mLs by mouth 3 (three) times daily between meals.   magnesium oxide 400 (240 Mg) MG tablet Commonly known as: MAG-OX Take 1 tablet (400 mg total) by mouth 2 (two) times daily.   metFORMIN 1000 MG tablet Commonly known as: GLUCOPHAGE Take 1 tablet (1,000 mg total) by mouth 2 (two) times daily with a meal.   metoprolol tartrate 100 MG tablet Commonly known as: LOPRESSOR Take 100 mg by mouth 2 (two) times daily.   mirtazapine 7.5 MG tablet Commonly known as: REMERON Take 1 tablet (7.5 mg total) by mouth at bedtime.   multivitamin with minerals Tabs tablet Take 1 tablet by mouth daily.   pantoprazole 40 MG tablet Commonly known as: PROTONIX Take 1 tablet (40 mg total) by mouth daily.   polyethylene glycol 17 g packet Commonly known as: MIRALAX / GLYCOLAX Take 17 g by mouth daily.   senna-docusate 8.6-50 MG tablet Commonly known as: Senokot-S Take 2 tablets by mouth at bedtime.         The results of significant diagnostics from this hospitalization (including imaging, microbiology, ancillary and laboratory) are listed below for reference.    Procedures and Diagnostic Studies:   CT CHEST ABDOMEN PELVIS W CONTRAST  Result Date: 02/02/2022 CLINICAL DATA:  Unintended weight loss.  Melanoma EXAM: CT CHEST, ABDOMEN, AND PELVIS WITH CONTRAST TECHNIQUE: Multidetector CT imaging of the chest, abdomen and pelvis was performed following the standard protocol during  bolus administration of intravenous contrast. RADIATION DOSE REDUCTION: This exam was performed according to the departmental dose-optimization program which includes automated exposure control, adjustment of the mA and/or kV according to patient size and/or use of iterative reconstruction technique. CONTRAST:  114m OMNIPAQUE IOHEXOL 350 MG/ML SOLN COMPARISON:  11/10/2019 FINDINGS: CT CHEST FINDINGS Cardiovascular: Heart is normal size. Scattered coronary artery and aortic calcifications. No aneurysm. Retroesophageal right subclavian artery noted. Mediastinum/Nodes: No mediastinal, hilar, or axillary adenopathy. Trachea and esophagus are unremarkable. Thyroid unremarkable. Small hiatal hernia. Lungs/Pleura: Lungs are clear. No focal airspace opacities or suspicious nodules. No effusions. Musculoskeletal: Chest wall soft tissues are unremarkable. No acute bony abnormality. Old healed fractures bilaterally. CT ABDOMEN PELVIS FINDINGS Hepatobiliary: No focal hepatic abnormality. Gallbladder unremarkable. Pancreas: No focal hepatic abnormality. Gallbladder unremarkable. Calcifications in the pancreatic head and body compatible with chronic pancreatitis. Mixed density predominantly low-density mass noted in the pancreatic head/uncinate process measuring 3.6 x 3.4 cm. There is pancreatic ductal dilatation. Pancreatic duct measures up to 7 mm in the pancreatic head/body. Spleen: No focal abnormality.  Normal size. Adrenals/Urinary Tract:  No adrenal abnormality. No focal renal abnormality. No stones or hydronephrosis. Urinary bladder is unremarkable. Stomach/Bowel: Stomach, large and small bowel grossly unremarkable. Vascular/Lymphatic: Aortic atherosclerosis. No evidence of aneurysm or adenopathy. Reproductive: Mildly prominent prostate Other: No free fluid or free air. Musculoskeletal: No acute bony abnormality. IMPRESSION: No acute cardiopulmonary disease. Coronary artery disease, aortic atherosclerosis. Small hiatal  hernia. Changes of chronic pancreatitis. New cystic mass within the pancreatic head/uncinate process measuring up to 3.6 cm. Pancreatic ductal dilatation noted. Findings concerning for possible cystic pancreatic neoplasm. When the patient is clinically stable and able to follow directions and hold their breath (preferably as an outpatient) further evaluation with dedicated abdominal MRI should be considered. Electronically Signed   By: Rolm Baptise M.D.   On: 02/02/2022 00:10     Labs:   Basic Metabolic Panel: Recent Labs  Lab 02/03/22 0131 02/04/22 0114 02/04/22 0717 02/06/22 0043 02/07/22 0104 02/09/22 0340  NA 133* 138  --  138 138 141  K 3.5 3.7  --  3.5 3.7 4.1  CL 103 108  --  105 107 108  CO2 22 23  --  '26 26 25  '$ GLUCOSE 131* 177*  --  138* 165* 165*  BUN 15 10  --  18 21* 18  CREATININE 0.85 0.96  --  0.87 0.85 0.84  CALCIUM 8.5* 8.7*  --  8.5* 8.6* 8.7*  MG  --  1.6*  --  1.3* 1.4*  --   PHOS  --   --  2.8  --   --   --    GFR Estimated Creatinine Clearance: 75.7 mL/min (by C-G formula based on SCr of 0.84 mg/dL). Liver Function Tests: Recent Labs  Lab 02/03/22 0131 02/04/22 0114  AST 17 13*  ALT 13 11  ALKPHOS 36* 38  BILITOT 0.5 0.3  PROT 5.8* 5.6*  ALBUMIN 2.9* 2.7*   No results for input(s): LIPASE, AMYLASE in the last 168 hours. No results for input(s): AMMONIA in the last 168 hours. Coagulation profile No results for input(s): INR, PROTIME in the last 168 hours.  CBC: Recent Labs  Lab 02/03/22 0131 02/04/22 0114 02/06/22 0043 02/09/22 0340  WBC 13.3* 7.2 5.9 6.4  NEUTROABS  --   --   --  3.6  HGB 11.3* 11.0* 9.9* 9.9*  HCT 32.3* 32.1* 28.1* 28.9*  MCV 91.0 90.9 89.8 91.7  PLT 336 348 369 445*   Cardiac Enzymes: No results for input(s): CKTOTAL, CKMB, CKMBINDEX, TROPONINI in the last 168 hours. BNP: Invalid input(s): POCBNP CBG: Recent Labs  Lab 02/08/22 1127 02/08/22 1625 02/08/22 2041 02/09/22 0843 02/09/22 1143  GLUCAP 282* 135*  215* 137* 228*   D-Dimer No results for input(s): DDIMER in the last 72 hours. Hgb A1c No results for input(s): HGBA1C in the last 72 hours. Lipid Profile No results for input(s): CHOL, HDL, LDLCALC, TRIG, CHOLHDL, LDLDIRECT in the last 72 hours. Thyroid function studies No results for input(s): TSH, T4TOTAL, T3FREE, THYROIDAB in the last 72 hours.  Invalid input(s): FREET3 Anemia work up No results for input(s): VITAMINB12, FOLATE, FERRITIN, TIBC, IRON, RETICCTPCT in the last 72 hours. Microbiology Recent Results (from the past 240 hour(s))  Urine Culture     Status: Abnormal   Collection Time: 02/01/22  4:26 PM   Specimen: Urine, Clean Catch  Result Value Ref Range Status   Specimen Description URINE, CLEAN CATCH  Final   Special Requests   Final    NONE Performed at [facility]  [facility], [address], [place], [place] [address]    Culture >=100,000 COLONIES/mL ESCHERICHIA COLI (A)  Final   Report Status 02/04/2022 FINAL  Final   Organism ID, Bacteria ESCHERICHIA COLI (A)  Final      Susceptibility   Escherichia coli - MIC*    AMPICILLIN 8 SENSITIVE Sensitive     CEFAZOLIN <=4 SENSITIVE Sensitive     CEFEPIME <=0.12 SENSITIVE Sensitive     CEFTRIAXONE <=0.25 SENSITIVE Sensitive     CIPROFLOXACIN <=0.25 SENSITIVE Sensitive     GENTAMICIN <=1 SENSITIVE Sensitive     IMIPENEM <=0.25 SENSITIVE Sensitive     NITROFURANTOIN <=16 SENSITIVE Sensitive     TRIMETH/SULFA <=20 SENSITIVE Sensitive     AMPICILLIN/SULBACTAM <=2 SENSITIVE Sensitive     PIP/TAZO <=4 SENSITIVE Sensitive     * >=100,000 COLONIES/mL ESCHERICHIA COLI  Resp Panel by RT-PCR (Flu A&B, Covid) Nasopharyngeal Swab     Status: None   Collection Time: 02/01/22  7:41 PM   Specimen: Nasopharyngeal Swab; Nasopharyngeal(NP) swabs in vial transport medium  Result Value Ref Range Status   SARS Coronavirus 2 by RT PCR NEGATIVE NEGATIVE Final    Comment: (NOTE) SARS-CoV-2 target nucleic acids are NOT DETECTED.  The  SARS-CoV-2 RNA is generally detectable in upper respiratory specimens during the acute phase of infection. The lowest concentration of SARS-CoV-2 viral copies this assay can detect is 138 copies/mL. A negative result does not preclude SARS-Cov-2 infection and should not be used as the sole basis for treatment or other patient management decisions. A negative result may occur with  improper specimen collection/handling, submission of specimen other than nasopharyngeal swab, presence of viral mutation(s) within the areas targeted by this assay, and inadequate number of viral copies(<138 copies/mL). A negative result must be combined with clinical observations, patient history, and epidemiological information. The expected result is Negative.  Fact Sheet for Patients:  EntrepreneurPulse.com.au  Fact Sheet for Healthcare Providers:  IncredibleEmployment.be  This test is no t yet approved or cleared by the [place] FDA and  has been authorized for detection and/or diagnosis of SARS-CoV-2 by FDA under an Emergency Use Authorization (EUA). This EUA will remain  in effect (meaning this test can be used) for the duration of the COVID-19 declaration under Section 564(b)(1) of the Act, 21 U.S.C.section 360bbb-3(b)(1), unless the authorization is terminated  or revoked sooner.       Influenza A by PCR NEGATIVE NEGATIVE Final   Influenza B by PCR NEGATIVE NEGATIVE Final    Comment: (NOTE) The Xpert Xpress SARS-CoV-2/FLU/RSV plus assay is intended as an aid in the diagnosis of influenza from Nasopharyngeal swab specimens and should not be used as a sole basis for treatment. Nasal washings and aspirates are unacceptable for Xpert Xpress SARS-CoV-2/FLU/RSV testing.  Fact Sheet for Patients: EntrepreneurPulse.com.au  Fact Sheet for Healthcare Providers: IncredibleEmployment.be  This test is not yet approved or  cleared by the [place] FDA and has been authorized for detection and/or diagnosis of SARS-CoV-2 by FDA under an Emergency Use Authorization (EUA). This EUA will remain in effect (meaning this test can be used) for the duration of the COVID-19 declaration under Section 564(b)(1) of the Act, 21 U.S.C. section 360bbb-3(b)(1), unless the authorization is terminated or revoked.  Performed at [facility], [place] [address], [place], [place] [address]     Time coordinating discharge: 35 minutes  Signed: Leeah Politano  [facility] 02/09/2022, 2:08 PM

## 2022-02-09 NOTE — Progress Notes (Signed)
Physical Therapy Treatment ?Patient Details ?Name: Kenneth [name] ?MRN: [id number] ?DOB: Jan 27, 1961 ?Today's Date: 02/09/2022 ? ? ?History of Present Illness 61 y.o. male admitted 3/1 with fatigue and GIB s/p EGD 3/3. PMHx: 01/24/22 CVA with left pontine & right thalamic infarct, melanoma with mets to brain and lung, DM, HTN, HLD, polyneuropathy, ETOH abuse ? ?  ?PT Comments  ? ? Pt making good progress towards goals with session focused on more high level balance challenges. Pt able to ambulate with RW with head turns R<>L, identifying numbers and pictures with mild deviations but no overt LOB. Pt able to side step, retro step, tandem walk, and perform step taps on 12" trashcan with HHA for multiple bouts with some postural sway, and ability to self correct any and all LOB. Pt able to ambulate from hall bench back to room with min guard with no AD with no LOB. Current plan remains appropriate to address deficits and maximize functional independence and decrease caregiver burden. Pt continues to benefit from skilled PT services to progress toward functional mobility goals.  ?  ?Recommendations for follow up therapy are one component of a multi-disciplinary discharge planning process, led by the attending physician.  Recommendations may be updated based on patient status, additional functional criteria and insurance authorization. ? ?Follow Up Recommendations ? Skilled nursing-short term rehab (<3 hours/day) ?  ?  ?Assistance Recommended at Discharge Frequent or constant Supervision/Assistance  ?Patient can return home with the following A little help with walking and/or transfers;A little help with bathing/dressing/bathroom;Assist for transportation;Help with stairs or ramp for entrance;Assistance with cooking/housework;Direct supervision/assist for medications management;Direct supervision/assist for financial management ?  ?Equipment Recommendations ? BSC/3in1  ?  ?Recommendations for Other Services   ? ? ?   ?Precautions / Restrictions Precautions ?Precautions: Fall ?Restrictions ?Weight Bearing Restrictions: No  ?  ? ?Mobility ? Bed Mobility ?  ?  ?  ?  ?  ?  ?  ?  ?  ? ?Transfers ?Overall transfer level: Needs assistance ?Equipment used: Rolling walker (2 wheels), None ?Transfers: Sit to/from Stand ?Sit to Stand: Supervision, Independent ?  ?  ?  ?  ?  ?General transfer comment: min guard for safety, good power up and self steady ?  ? ?Ambulation/Gait ?Ambulation/Gait assistance: Min guard ?Gait Distance (Feet): 150 Feet ?Assistive device: Rolling walker (2 wheels) ?Gait Pattern/deviations: Step-through pattern, Decreased stride length, Shuffle ?Gait velocity: decreased ?  ?  ?General Gait Details: min guard for safety, pt able to look R<>L without LOB, mild deviations and unsteadiness ? ? ?Stairs ?  ?  ?  ?  ?  ? ? ?Wheelchair Mobility ?  ? ?Modified Rankin (Stroke Patients Only) ?  ? ? ?  ?Balance Overall balance assessment: Needs assistance ?Sitting-balance support: Feet supported ?Sitting balance-Leahy Scale: Good ?  ?  ?Standing balance support: Reliant on assistive device for balance ?Standing balance-Leahy Scale: Fair ?Standing balance comment: able to static stand without UE support ?  ?  ?  ?  ?  ?  ?  ?  ?  ?  ?  ?  ? ?  ?Cognition Arousal/Alertness: Awake/alert ?Behavior During Therapy: [facility] for tasks assessed/performed ?Overall Cognitive Status: Impaired/Different from baseline ?  ?  ?  ?  ?  ?  ?  ?  ?  ?  ?  ?  ?  ?Safety/Judgement: Decreased awareness of safety ?  ?Problem Solving: Slow processing ?  ?  ?  ? ?  ?Exercises Other Exercises ?Other  Exercises: side stepping R<>L, retro stepping, tandem walking for balance challenge, HHA x1 ?Other Exercises: alternating step taps to large trashcan x20, HHA x1 ?Other Exercises: STS without UE supoprt x5 ? ?  ?General Comments   ?  ?  ? ?Pertinent Vitals/Pain    ? ? ?Home Living   ?  ?  ?  ?  ?  ?  ?  ?  ?  ?   ?  ?Prior Function    ?  ?  ?   ? ?PT Goals  (current goals can now be found in the care plan section) Acute Rehab PT Goals ?PT Goal Formulation: With patient ?Time For Goal Achievement: 02/18/22 ? ?  ?Frequency ? ? ? Min 2X/week ? ? ? ?  ?PT Plan Current plan remains appropriate  ? ? ?Co-evaluation   ?  ?  ?  ?  ? ?  ?AM-PAC PT "6 Clicks" Mobility   ?Outcome Measure ? Help needed turning from your back to your side while in a flat bed without using bedrails?: A Little ?Help needed moving from lying on your back to sitting on the side of a flat bed without using bedrails?: A Little ?Help needed moving to and from a bed to a chair (including a wheelchair)?: A Little ?Help needed standing up from a chair using your arms (e.g., wheelchair or bedside chair)?: A Little ?Help needed to walk in hospital room?: A Little ?Help needed climbing 3-5 steps with a railing? : A Lot ?6 Click Score: 17 ? ?  ?End of Session Equipment Utilized During Treatment: Gait belt ?Activity Tolerance: Patient tolerated treatment well ?Patient left: with call bell/phone within reach;in bed ?Nurse Communication: Mobility status ?PT Visit Diagnosis: Muscle weakness (generalized) (M62.81);History of falling (Z91.81);Other abnormalities of gait and mobility (R26.89) ?  ? ? ?Time: 8921-1941 ?PT Time Calculation (min) (ACUTE ONLY): 20 min ? ?Charges:  $Therapeutic Exercise: 8-22 mins          ?          ? ?Audry Riles. PTA ?Acute Rehabilitation Services ?Office: [phone] ? ? ? ?Betsey Holiday Lillard Bailon ?02/09/2022, 10:48 AM ? ?

## 2022-02-10 ENCOUNTER — Other Ambulatory Visit: Payer: Self-pay

## 2022-02-10 DIAGNOSIS — K8689 Other specified diseases of pancreas: Secondary | ICD-10-CM

## 2022-02-13 ENCOUNTER — Telehealth: Payer: Self-pay | Admitting: Internal Medicine

## 2022-02-13 DIAGNOSIS — R32 Unspecified urinary incontinence: Secondary | ICD-10-CM

## 2022-02-13 NOTE — Telephone Encounter (Signed)
Okay to refer? 

## 2022-02-13 NOTE — Telephone Encounter (Signed)
Kenneth [name] is needing a Urology referral for [name] because he is unable to hold his urine (incontinent). ?

## 2022-02-13 NOTE — Telephone Encounter (Signed)
Florence came in to the office and asked if Dr. Jerilee Hoh can fill out the FMLA form for Kenneth [name]; and please call [name] when it is done.  ?

## 2022-02-13 NOTE — Telephone Encounter (Signed)
Referral placed.

## 2022-02-14 ENCOUNTER — Other Ambulatory Visit: Payer: Self-pay | Admitting: *Deleted

## 2022-02-14 NOTE — Telephone Encounter (Signed)
Virtual visit scheduled.  

## 2022-02-14 NOTE — Telephone Encounter (Signed)
Placed in Dr Hernandez's folder 

## 2022-02-14 NOTE — Patient Outreach (Signed)
[facility]) Care Management ? ?02/14/2022 ? ?[name] ?1961/09/17 ?[id number] ? ? ?Member was readmitted to hospital 3/1-3/9 for failure to thrive, discharged to SNF.  Will close case at this time. ? ?Valente David, RN, MSN, CCM ?[facility] Care Management  ?Community Care Manager ?[phone] ? ?

## 2022-02-15 ENCOUNTER — Encounter: Payer: Self-pay | Admitting: Internal Medicine

## 2022-02-15 ENCOUNTER — Ambulatory Visit (INDEPENDENT_AMBULATORY_CARE_PROVIDER_SITE_OTHER): Payer: BC Managed Care – PPO | Admitting: Internal Medicine

## 2022-02-15 VITALS — Wt 125.0 lb

## 2022-02-15 DIAGNOSIS — I639 Cerebral infarction, unspecified: Secondary | ICD-10-CM | POA: Diagnosis not present

## 2022-02-15 DIAGNOSIS — E43 Unspecified severe protein-calorie malnutrition: Secondary | ICD-10-CM | POA: Diagnosis not present

## 2022-02-15 DIAGNOSIS — R627 Adult failure to thrive: Secondary | ICD-10-CM

## 2022-02-15 DIAGNOSIS — K862 Cyst of pancreas: Secondary | ICD-10-CM

## 2022-02-15 NOTE — Progress Notes (Signed)
? ? ?Virtual Visit via Video Note ? ?I connected with Kenneth [name] on 02/15/22 at  2:00 PM EDT by a video enabled telemedicine application and verified that I am speaking with the correct person using two identifiers. ? ?Location patient: home ?Location provider: work office ?Persons participating in the virtual visit: patient, provider, sister ? ?I discussed the limitations of evaluation and management by telemedicine and the availability of in person appointments. The patient expressed understanding and agreed to proceed. ? ? ?HPI: ?This visit has been scheduled because he needs FMLA forms filled out.  I saw him for the first time in December.  He was admitted to the hospital on February 21 and diagnosed with a CVA.  It appears he has been readmitted at least twice with adult failure to thrive and frequent falls.  It looks like he had heme positive stool.  It looks like he may have a pancreatic cyst, I am unsure as to the follow-up plan.  He was discharged from the hospital beginning of March and has been at short-term rehab ever since.  They are unclear as to when he will be discharged. ? ? ?ROS: ?Constitutional: Denies fever, chills, diaphoresis. ?HEENT: Denies photophobia, eye pain, redness, hearing loss, ear pain, congestion, sore throat, rhinorrhea, sneezing, mouth sores, trouble swallowing, neck pain, neck stiffness and tinnitus.   ?Respiratory: Denies SOB, DOE, cough, chest tightness,  and wheezing.   ?Cardiovascular: Denies chest pain, palpitations and leg swelling.  ?Gastrointestinal: Denies nausea, vomiting, abdominal pain, diarrhea, constipation, blood in stool and abdominal distention.  ?Genitourinary: Denies dysuria, urgency, frequency, hematuria, flank pain and difficulty urinating.  ?Endocrine: Denies: hot or cold intolerance, sweats, changes in hair or nails, polyuria, polydipsia. ?Musculoskeletal: Denies myalgias, back pain, joint swelling, arthralgias and gait problem.  ?Skin: Denies pallor,  rash and wound.  ?Neurological: Denies dizziness, seizures, syncope,  light-headedness, numbness and headaches.  ?Hematological: Denies adenopathy. Easy bruising, personal or family bleeding history  ?Psychiatric/Behavioral: Denies suicidal ideation, mood changes, confusion, nervousness, sleep disturbance and agitation ? ? ?Past Medical History:  ?Diagnosis Date  ? Alcohol dependence ([place])   ? Chronic calcific pancreatitis ([place])   ? CVA (cerebral vascular accident) ([place]) 01/24/2022  ? Diabetes mellitus without complication ([place])   ? Hyperlipidemia   ? Hypertension   ? Melanoma ([place]) 2013  ? metastatic - resected and cured with chemo/immuno tx  ? Pancreatic cyst-mass?   ? Polyneuropathy   ? ? ?Past Surgical History:  ?Procedure Laterality Date  ? BIOPSY  02/03/2022  ? Procedure: BIOPSY;  Surgeon: Gatha Mayer, MD;  Location: [facility] ENDOSCOPY;  Service: Gastroenterology;;  ? BRAIN SURGERY  2015  ? to check for possible malignancy from melanoma, result were scar tissue  ? COLONOSCOPY WITH PROPOFOL N/A 02/03/2022  ? Procedure: COLONOSCOPY WITH PROPOFOL;  Surgeon: Gatha Mayer, MD;  Location: [place];  Service: Gastroenterology;  Laterality: N/A;  ? ESOPHAGOGASTRODUODENOSCOPY (EGD) WITH PROPOFOL N/A 02/03/2022  ? Procedure: ESOPHAGOGASTRODUODENOSCOPY (EGD) WITH PROPOFOL;  Surgeon: Gatha Mayer, MD;  Location: [place];  Service: Gastroenterology;  Laterality: N/A;  ? melanoma exicison    ? right leg  ? ? ?Family History  ?Problem Relation Age of Onset  ? Prostate cancer Father   ? Stomach cancer Paternal Grandmother   ? Colon cancer Neg Hx   ? Esophageal cancer Neg Hx   ? Rectal cancer Neg Hx   ? ? ?SOCIAL HX:  ? reports that he has never smoked. His smokeless tobacco use  includes snuff. He reports that he does not currently use alcohol. He reports that he does not use drugs. ? ? ?Current Outpatient Medications:  ?  amLODipine (NORVASC) 10 MG tablet, Take 10 mg by mouth daily., Disp: , Rfl:  ?  aspirin EC 81 MG  EC tablet, Take 1 tablet (81 mg total) by mouth daily. Swallow whole., Disp: 30 tablet, Rfl: 11 ?  atorvastatin (LIPITOR) 20 MG tablet, Take 1 tablet (20 mg total) by mouth daily., Disp: 30 tablet, Rfl: 0 ?  clopidogrel (PLAVIX) 75 MG tablet, Take 1 tablet (75 mg total) by mouth daily for 17 days. LAST DOSE ON 02/23/2022, Disp: 17 tablet, Rfl: 0 ?  feeding supplement (ENSURE ENLIVE / ENSURE PLUS) LIQD, Take 237 mLs by mouth 3 (three) times daily between meals., Disp: 237 mL, Rfl: 12 ?  magnesium oxide (MAG-OX) 400 (240 Mg) MG tablet, Take 1 tablet (400 mg total) by mouth 2 (two) times daily., Disp: , Rfl:  ?  metFORMIN (GLUCOPHAGE) 1000 MG tablet, Take 1 tablet (1,000 mg total) by mouth 2 (two) times daily with a meal., Disp: 60 tablet, Rfl: 0 ?  metoprolol tartrate (LOPRESSOR) 100 MG tablet, Take 100 mg by mouth 2 (two) times daily., Disp: , Rfl:  ?  mirtazapine (REMERON) 7.5 MG tablet, Take 1 tablet (7.5 mg total) by mouth at bedtime., Disp: 30 tablet, Rfl: 1 ?  Multiple Vitamin (MULTIVITAMIN WITH MINERALS) TABS tablet, Take 1 tablet by mouth daily., Disp: , Rfl:  ?  pantoprazole (PROTONIX) 40 MG tablet, Take 1 tablet (40 mg total) by mouth daily., Disp: , Rfl:  ?  polyethylene glycol (MIRALAX / GLYCOLAX) 17 g packet, Take 17 g by mouth daily., Disp: 14 each, Rfl: 0 ?  senna-docusate (SENOKOT-S) 8.6-50 MG tablet, Take 2 tablets by mouth at bedtime., Disp: , Rfl:  ? ?EXAM:  ? ?VITALS per patient if applicable: None reported ? ?GENERAL: alert, oriented, appears well and in no acute distress ? ?HEENT: atraumatic, conjunttiva clear, no obvious abnormalities on inspection of external nose and ears, wearing corrective lenses ? ?NECK: normal movements of the head and neck ? ?LUNGS: on inspection no signs of respiratory distress, breathing rate appears normal, no obvious gross increased work of breathing, gasping or wheezing ? ?CV: no obvious cyanosis ? ?MS: moves all visible extremities without noticeable  abnormality ? ?PSYCH/NEURO: pleasant and cooperative, no obvious depression or anxiety, speech and thought processing grossly intact ? ?ASSESSMENT AND PLAN: ? ? ?Failure to thrive in adult ? ?Pancreatic cyst - mass ? ?Protein-calorie malnutrition, severe ? ?Acute CVA (cerebrovascular accident) ([place]). ? ?-I have explained to patient and sister that at this time I am only able to fill FMLA forms to document period of incapacity to work from the time he was admitted to the hospital on February 21 until an estimated date of discharge from short-term rehab on March 31.  After that, I will be unable to fill out appropriate FMLA forms until he has follow-up with his care team to determine his care plan.  Sister tells me that he has follow-ups with GI, neurology and psychiatry coming up. ?-They are requesting referral to urology for an elevated PSA and urinary incontinence. ? ? ?  ?I discussed the assessment and treatment plan with the patient. The patient was provided an opportunity to ask questions and all were answered. The patient agreed with the plan and demonstrated an understanding of the instructions. ?  ?The patient was advised to call back or  seek an in-person evaluation if the symptoms worsen or if the condition fails to improve as anticipated. ? ? ? ?Lelon Frohlich, MD  ?[facility] ? ?

## 2022-02-17 ENCOUNTER — Telehealth: Payer: Self-pay | Admitting: Internal Medicine

## 2022-02-17 DIAGNOSIS — E119 Type 2 diabetes mellitus without complications: Secondary | ICD-10-CM

## 2022-02-17 NOTE — Telephone Encounter (Signed)
Pt is calling and per pt he use to be diabetic and would like a referral to podiatry to get his toenails trimmed. Pt has [facility]. Pt last seen md on 02-15-2022 ?

## 2022-02-20 ENCOUNTER — Encounter: Payer: Self-pay | Admitting: Internal Medicine

## 2022-02-20 NOTE — Addendum Note (Signed)
Addended by: Gwenyth Ober R on: 02/20/2022 10:14 AM ? ? Modules accepted: Orders ? ?

## 2022-02-20 NOTE — Telephone Encounter (Signed)
ERROR

## 2022-02-20 NOTE — Telephone Encounter (Signed)
Referral has been placed. 

## 2022-02-21 ENCOUNTER — Ambulatory Visit: Payer: BC Managed Care – PPO | Admitting: Internal Medicine

## 2022-02-24 ENCOUNTER — Telehealth: Payer: Self-pay | Admitting: Internal Medicine

## 2022-02-24 NOTE — Telephone Encounter (Signed)
Kenneth [name], sister, called in. Pt family seeing Dr. Gena Fray at [facility]. Pt would like to establish here too. Please advise if ok. ? ?Call back # [phone] ?

## 2022-02-26 DIAGNOSIS — I639 Cerebral infarction, unspecified: Secondary | ICD-10-CM | POA: Diagnosis not present

## 2022-02-26 DIAGNOSIS — M6281 Muscle weakness (generalized): Secondary | ICD-10-CM | POA: Diagnosis not present

## 2022-02-27 ENCOUNTER — Telehealth: Payer: Self-pay | Admitting: Family Medicine

## 2022-02-27 ENCOUNTER — Telehealth: Payer: Self-pay | Admitting: Internal Medicine

## 2022-02-27 DIAGNOSIS — I639 Cerebral infarction, unspecified: Secondary | ICD-10-CM | POA: Diagnosis not present

## 2022-02-27 DIAGNOSIS — M6281 Muscle weakness (generalized): Secondary | ICD-10-CM | POA: Diagnosis not present

## 2022-02-27 NOTE — Telephone Encounter (Signed)
Che with [facility] stated that [facility] filled the Remeron and Pantoprazole wrong for the patient. Che wants to know if Dr.Hernandez could fill this. ? ?Donney Dice could be contacted at [phone]. ? ?Please advise. ?

## 2022-02-27 NOTE — Telephone Encounter (Signed)
Pt is wanting a refill on pt's pantoprazole (PROTONIX) 40 MG tablet [[id number]] and mirtazapine (REMERON) 7.5 MG tablet [[id number]] . Pt is transitioning to our practice with Dr. Gena Fray from [facility]. The pt has never been seen by Dr. Gena Fray. She still wanted me to put a request. I asked her to contact his previous provider.  ?

## 2022-02-27 NOTE — Telephone Encounter (Signed)
Verbal orders for [facility] given to [facility]. ?

## 2022-02-27 NOTE — Telephone Encounter (Signed)
Kenneth [name] is aware.  She will call the sister to have the patient schedule an office visit. ?

## 2022-02-27 NOTE — Telephone Encounter (Signed)
Che with [facility] called in requesting verbal orders for patient. The verbal orders are for Home Health once a week for 9 weeks. ? ?Donney Dice could be contacted at [phone]. ?  ?Please advise. ?

## 2022-02-27 NOTE — Telephone Encounter (Signed)
Both medications was D/c on 02/06/22 when discharged from hospital.  Will call in the morning to talk to [place].  At [phone]. Dm/cma ? ?

## 2022-02-28 ENCOUNTER — Encounter: Payer: Self-pay | Admitting: Family Medicine

## 2022-02-28 ENCOUNTER — Ambulatory Visit: Payer: BC Managed Care – PPO | Admitting: Family Medicine

## 2022-02-28 VITALS — BP 130/74 | HR 70 | Temp 97.0°F | Ht 64.0 in | Wt 130.4 lb

## 2022-02-28 DIAGNOSIS — I639 Cerebral infarction, unspecified: Secondary | ICD-10-CM | POA: Diagnosis not present

## 2022-02-28 DIAGNOSIS — Z8673 Personal history of transient ischemic attack (TIA), and cerebral infarction without residual deficits: Secondary | ICD-10-CM | POA: Insufficient documentation

## 2022-02-28 DIAGNOSIS — K2990 Gastroduodenitis, unspecified, without bleeding: Secondary | ICD-10-CM | POA: Diagnosis not present

## 2022-02-28 DIAGNOSIS — K2970 Gastritis, unspecified, without bleeding: Secondary | ICD-10-CM | POA: Diagnosis not present

## 2022-02-28 DIAGNOSIS — R627 Adult failure to thrive: Secondary | ICD-10-CM | POA: Diagnosis not present

## 2022-02-28 DIAGNOSIS — M6281 Muscle weakness (generalized): Secondary | ICD-10-CM | POA: Diagnosis not present

## 2022-02-28 MED ORDER — MIRTAZAPINE 7.5 MG PO TABS: 7.5000 mg | ORAL_TABLET | Freq: Every day | ORAL | 3 refills | Status: DC

## 2022-02-28 MED ORDER — PANTOPRAZOLE SODIUM 40 MG PO TBEC: 40.0000 mg | DELAYED_RELEASE_TABLET | Freq: Every day | ORAL | 3 refills | Status: DC

## 2022-02-28 NOTE — Progress Notes (Signed)
?[name] ?[facility] ?[place] ?[place] [address] ?Dept: [phone] ?Dept Fax: [phone] ? ?Office Visit ? ?Subjective:  ? ? Patient ID: Kenneth [name], male    DOB: Dec 27, 1960, 61 y.o..   MRN: [id number] ? ?Chief Complaint  ?Patient presents with  ? Follow-up  ?  Med refills.  ? ? ?History of Present Illness: ? ?Patient is in today for reassessment of his depression and acid reflux. He presents with his sister. ? ?[name] has a complex history. He was admitted overnight at [facility] on 2/22 with difficulty walking, apparently due to an acute CVA. He was readmitted on 3/1-3/9 with failure to thrive that at least in part was due to chronic alcoholism. He is now living with a 2nd sister, having been in a nursing home previously. Right now, he is maintaining sobriety, eating better, and has noted some weight gain.  ? ?[name] has a history of depression. He is managed on Remeron 7.5 mg daily for this. ? ?[name] also has a history of  a GI bleed. He had a recent EGD and colonoscopy. He was found to have gastritis and duodenal erosions. He is currently managed on Protonix. ?  ?Past Medical History: ?Patient Active Problem List  ? Diagnosis Date Noted  ? History of CVA (cerebrovascular accident) 02/28/2022  ? Depression, recurrent ([place]) 02/28/2022  ? Pressure injury of skin 02/03/2022  ? Protein-calorie malnutrition, severe 02/03/2022  ? Gastritis and gastroduodenitis   ? Pancreatic cyst - mass 02/02/2022  ? Normocytic anemia 02/02/2022  ? Weight loss   ? Failure to thrive in adult 02/01/2022  ? Occult GI bleeding 02/01/2022  ? DNR (do not resuscitate) 02/01/2022  ? Dizziness 01/24/2022  ? ARF (acute renal failure) ([place]) 01/24/2022  ? Hyponatremia 01/24/2022  ? Elevated PSA 11/24/2021  ? Hypokalemia 11/24/2021  ? History of immunotherapy 11/25/2019  ? Hx of colonic polyps 03/28/2017  ? Melanoma metastatic to brain [facility]) 08/08/2016  ? Cutaneous  melanoma ([place]) 08/13/2014  ? Metastatic melanoma to lung ([place]) 12/12/2012  ? Diabetes mellitus without complication ([place]) 96/22/2979  ? Dyslipidemia 08/20/2007  ? Essential hypertension 08/19/2007  ? ?Past Surgical History:  ?Procedure Laterality Date  ? BIOPSY  02/03/2022  ? Procedure: BIOPSY;  Surgeon: Gatha Mayer, MD;  Location: [facility] ENDOSCOPY;  Service: Gastroenterology;;  ? BRAIN SURGERY  2015  ? to check for possible malignancy from melanoma, result were scar tissue  ? COLONOSCOPY WITH PROPOFOL N/A 02/03/2022  ? Procedure: COLONOSCOPY WITH PROPOFOL;  Surgeon: Gatha Mayer, MD;  Location: [place];  Service: Gastroenterology;  Laterality: N/A;  ? ESOPHAGOGASTRODUODENOSCOPY (EGD) WITH PROPOFOL N/A 02/03/2022  ? Procedure: ESOPHAGOGASTRODUODENOSCOPY (EGD) WITH PROPOFOL;  Surgeon: Gatha Mayer, MD;  Location: [place];  Service: Gastroenterology;  Laterality: N/A;  ? melanoma exicison    ? right leg  ? ?Family History  ?Problem Relation Age of Onset  ? Prostate cancer Father   ? Stomach cancer Paternal Grandmother   ? Colon cancer Neg Hx   ? Esophageal cancer Neg Hx   ? Rectal cancer Neg Hx   ? ?Outpatient Medications Prior to Visit  ?Medication Sig Dispense Refill  ? amLODipine (NORVASC) 10 MG tablet Take 10 mg by mouth daily.    ? aspirin EC 81 MG EC tablet Take 1 tablet (81 mg total) by mouth daily. Swallow whole. 30 tablet 11  ? atorvastatin (LIPITOR) 20 MG tablet Take 1 tablet (20 mg total) by mouth  daily. 30 tablet 0  ? feeding supplement (ENSURE ENLIVE / ENSURE PLUS) LIQD Take 237 mLs by mouth 3 (three) times daily between meals. 237 mL 12  ? magnesium oxide (MAG-OX) 400 (240 Mg) MG tablet Take 1 tablet (400 mg total) by mouth 2 (two) times daily.    ? metFORMIN (GLUCOPHAGE) 1000 MG tablet Take 1 tablet (1,000 mg total) by mouth 2 (two) times daily with a meal. 60 tablet 0  ? metoprolol tartrate (LOPRESSOR) 100 MG tablet Take 100 mg by mouth 2 (two) times daily.    ? Multiple Vitamin (MULTIVITAMIN  WITH MINERALS) TABS tablet Take 1 tablet by mouth daily.    ? polyethylene glycol (MIRALAX / GLYCOLAX) 17 g packet Take 17 g by mouth daily. 14 each 0  ? senna-docusate (SENOKOT-S) 8.6-50 MG tablet Take 2 tablets by mouth at bedtime.    ? mirtazapine (REMERON) 7.5 MG tablet Take 1 tablet (7.5 mg total) by mouth at bedtime. 30 tablet 1  ? pantoprazole (PROTONIX) 40 MG tablet Take 1 tablet (40 mg total) by mouth daily.    ? ?No facility-administered medications prior to visit.  ? ?Allergies  ?Allergen Reactions  ? Cat Hair Extract Shortness Of Breath and Swelling  ?  Swelling, watery eyes  ?  ?Objective:  ? ?Today's Vitals  ? 02/28/22 1338  ?BP: 130/74  ?Pulse: 70  ?Temp: (!) 97 ?F (36.1 ?C)  ?TempSrc: Temporal  ?SpO2: 99%  ?Weight: 130 lb 6.4 oz (59.1 kg)  ?Height: '5\' 4"'$  (1.626 m)  ? ?Body mass index is 22.38 kg/m?.  ? ?General: Well developed, well nourished. No acute distress. ?Psych: Alert and oriented. Normal mood and affect. ? ?Health Maintenance Due  ?Topic Date Due  ? OPHTHALMOLOGY EXAM  Never done  ? FOOT EXAM  12/17/2018  ?   ?Assessment & Plan:  ? ?1. Gastritis and gastroduodenitis ?I reviewed hospital discharge summaries for the Feb. and March admissions and the results of his recent EGD and colonoscopy. I will continue him on Protonix. He is scheduled for follow up with Dr. Carlean Purl (gastroenterology) on 03/15/2022. ? ?- pantoprazole (PROTONIX) 40 MG tablet; Take 1 tablet (40 mg total) by mouth daily.  Dispense: 90 tablet; Refill: 3 ? ?2. Failure to thrive in adult ?Weight is apparently up 5 lbs. In reviewing the hospital record, [name] was started on Remeron to stimulate his appetite. ? ?- mirtazapine (REMERON) 7.5 MG tablet; Take 1 tablet (7.5 mg total) by mouth at bedtime.  Dispense: 90 tablet; Refill: 3 ? ?Return for As scheduled.  ? ?Haydee Salter, MD ?

## 2022-02-28 NOTE — Telephone Encounter (Signed)
Scheduled 02/28/22.  Dm/cma ? ?

## 2022-02-28 NOTE — Telephone Encounter (Signed)
Spoke to [place], patients sister, and then transferred her to [place] to get patient scheduled sooner for med refills. Dm/cma ? ?

## 2022-03-01 ENCOUNTER — Telehealth: Payer: Self-pay

## 2022-03-01 NOTE — Telephone Encounter (Signed)
PA for Pantoprazole 40 mg submitted through cover my meds.  Awaiting response.  Dm/cma ? ?Key: [id number] ?

## 2022-03-01 NOTE — Telephone Encounter (Signed)
PA approved from 03/01/22-02/28/23.  Pharmacy notified [place] phone. Dm/cma ? ?

## 2022-03-02 DIAGNOSIS — M6281 Muscle weakness (generalized): Secondary | ICD-10-CM | POA: Diagnosis not present

## 2022-03-02 DIAGNOSIS — I639 Cerebral infarction, unspecified: Secondary | ICD-10-CM | POA: Diagnosis not present

## 2022-03-03 ENCOUNTER — Other Ambulatory Visit: Payer: Self-pay | Admitting: Adult Health

## 2022-03-03 ENCOUNTER — Ambulatory Visit (INDEPENDENT_AMBULATORY_CARE_PROVIDER_SITE_OTHER): Payer: BC Managed Care – PPO | Admitting: Adult Health

## 2022-03-03 VITALS — BP 138/73 | HR 68 | Ht 64.0 in | Wt 132.0 lb

## 2022-03-03 DIAGNOSIS — Z8673 Personal history of transient ischemic attack (TIA), and cerebral infarction without residual deficits: Secondary | ICD-10-CM

## 2022-03-03 DIAGNOSIS — I6381 Other cerebral infarction due to occlusion or stenosis of small artery: Secondary | ICD-10-CM | POA: Diagnosis not present

## 2022-03-03 DIAGNOSIS — I4891 Unspecified atrial fibrillation: Secondary | ICD-10-CM

## 2022-03-03 DIAGNOSIS — I639 Cerebral infarction, unspecified: Secondary | ICD-10-CM

## 2022-03-03 NOTE — Progress Notes (Signed)
? ? ?PATIENT: Kenneth [name] ?DOB: 1961-04-07 ? ?REASON FOR VISIT: follow up ?HISTORY FROM: patient ?PRIMARY NEUROLOGIST: Dr. Leonie Man ? ?Chief Complaint  ?Patient presents with  ? Hospitalization Follow-up  ?  RM 4, with [place]. Was at [facility] 02/01/22-02/09/22 for acute CVA. Ambulating w. Walker. Doing PT/OT therapy. Going once a week for this. Eventually going to psychiatrist.  No concerns right now from pt.   ? ? ?HISTORY OF PRESENT ILLNESS: ?Today 03/03/22 ? 61 y.o. male with history of melanoma with brain and lung mets s/p chemo and gamma knife but was told cancer free 3 years ago, HTN, DM, HLD, alcohol abuse admitted for fall x 2 at home with imbalance, BLE weakness and numbness. ? ?MRI showed acute right thalamic and subacute left paramedian pontine infarcts ?CTA head and neck unremarkable. ?2D Echo EF 60-65 % ?LDL 47 ?hgbA1c 6.6 % ?Placed on aspirin 81 mg daily and Plavix 75 mg daily for 3 weeks then aspirin alone. But according to the patients sister he was never given a script for plavix they called PCP and she refused to give it ( per the patient/sister). Therefore patient never took plavix. ? ?Currently staying with his sister. Denies any weakness. Has ongoing numbness in feet d/t neuropathy. Uses a walker to ambulate- used a cane prior to hospitalization. No trouble chewing/swallowing food. No trouble with speech. Has not been contacted about cardiac monitor. No longer drinking ETOH. Non-smoker. Sister reports that he is getting better everyday.Currently in PT. ? ? ?REVIEW OF SYSTEMS: Out of a complete 14 system review of symptoms, the patient complains only of the following symptoms, and all other reviewed systems are negative. ? ?ALLERGIES: ?Allergies  ?Allergen Reactions  ? Cat Hair Extract Shortness Of Breath and Swelling  ?  Swelling, watery eyes  ? ? ?HOME MEDICATIONS: ?Outpatient Medications Prior to Visit  ?Medication Sig Dispense Refill  ? amLODipine (NORVASC) 10 MG tablet Take 10 mg by  mouth daily.    ? aspirin EC 81 MG EC tablet Take 1 tablet (81 mg total) by mouth daily. Swallow whole. 30 tablet 11  ? atorvastatin (LIPITOR) 20 MG tablet Take 1 tablet (20 mg total) by mouth daily. 30 tablet 0  ? feeding supplement (ENSURE ENLIVE / ENSURE PLUS) LIQD Take 237 mLs by mouth 3 (three) times daily between meals. 237 mL 12  ? magnesium oxide (MAG-OX) 400 (240 Mg) MG tablet Take 1 tablet (400 mg total) by mouth 2 (two) times daily.    ? metFORMIN (GLUCOPHAGE) 1000 MG tablet Take 1 tablet (1,000 mg total) by mouth 2 (two) times daily with a meal. 60 tablet 0  ? metoprolol tartrate (LOPRESSOR) 100 MG tablet Take 100 mg by mouth 2 (two) times daily.    ? mirtazapine (REMERON) 7.5 MG tablet Take 1 tablet (7.5 mg total) by mouth at bedtime. 90 tablet 3  ? Multiple Vitamin (MULTIVITAMIN WITH MINERALS) TABS tablet Take 1 tablet by mouth daily.    ? pantoprazole (PROTONIX) 40 MG tablet Take 1 tablet (40 mg total) by mouth daily. 90 tablet 3  ? polyethylene glycol (MIRALAX / GLYCOLAX) 17 g packet Take 17 g by mouth daily. 14 each 0  ? senna-docusate (SENOKOT-S) 8.6-50 MG tablet Take 2 tablets by mouth at bedtime.    ? ?No facility-administered medications prior to visit.  ? ? ?PAST MEDICAL HISTORY: ?Past Medical History:  ?Diagnosis Date  ? Alcohol dependence ([place])   ? Chronic calcific pancreatitis ([place])   ? CVA (cerebral  vascular accident) ([place]) 01/24/2022  ? Diabetes mellitus without complication ([place])   ? Hyperlipidemia   ? Hypertension   ? Melanoma ([place]) 2013  ? metastatic - resected and cured with chemo/immuno tx  ? Pancreatic cyst-mass?   ? Polyneuropathy   ? ? ?PAST SURGICAL HISTORY: ?Past Surgical History:  ?Procedure Laterality Date  ? BIOPSY  02/03/2022  ? Procedure: BIOPSY;  Surgeon: Gatha Mayer, MD;  Location: [facility] ENDOSCOPY;  Service: Gastroenterology;;  ? BRAIN SURGERY  2015  ? to check for possible malignancy from melanoma, result were scar tissue  ? COLONOSCOPY WITH PROPOFOL N/A 02/03/2022  ?  Procedure: COLONOSCOPY WITH PROPOFOL;  Surgeon: Gatha Mayer, MD;  Location: [place];  Service: Gastroenterology;  Laterality: N/A;  ? ESOPHAGOGASTRODUODENOSCOPY (EGD) WITH PROPOFOL N/A 02/03/2022  ? Procedure: ESOPHAGOGASTRODUODENOSCOPY (EGD) WITH PROPOFOL;  Surgeon: Gatha Mayer, MD;  Location: [place];  Service: Gastroenterology;  Laterality: N/A;  ? melanoma exicison    ? right leg  ? ? ?FAMILY HISTORY: ?Family History  ?Problem Relation Age of Onset  ? Prostate cancer Father   ? Stomach cancer Paternal Grandmother   ? Colon cancer Neg Hx   ? Esophageal cancer Neg Hx   ? Rectal cancer Neg Hx   ? ? ?SOCIAL HISTORY: ?Social History  ? ?Socioeconomic History  ? Marital status: Single  ?  Spouse name: Not on file  ? Number of children: Not on file  ? Years of education: Not on file  ? Highest education level: Not on file  ?Occupational History  ? Occupation: [employer]  ?Tobacco Use  ? Smoking status: Never  ? Smokeless tobacco: Current  ?  Types: Snuff  ?Vaping Use  ? Vaping Use: Never used  ?Substance and Sexual Activity  ? Alcohol use: Not Currently  ?  Comment: 9 beers daily  ? Drug use: No  ? Sexual activity: Not on file  ?Other Topics Concern  ? Not on file  ?Social History Narrative  ? Not on file  ? ?Social Determinants of Health  ? ?Financial Resource Strain: Not on file  ?Food Insecurity: Not on file  ?Transportation Needs: Not on file  ?Physical Activity: Not on file  ?Stress: Not on file  ?Social Connections: Not on file  ?Intimate Partner Violence: Not on file  ? ? ? ? ?PHYSICAL EXAM ? ?Vitals:  ? 03/03/22 1043  ?BP: 138/73  ?Pulse: 68  ?Weight: 132 lb (59.9 kg)  ?Height: '5\' 4"'$  (1.626 m)  ? ?Body mass index is 22.66 kg/m?. ? ?Generalized: Well developed, in no acute distress  ? ?Neurological examination  ?Mentation: Alert oriented to time, place, history taking. Follows all commands speech and language fluent ?Cranial nerve II-XII: Pupils were equal round reactive to light. Extraocular  movements were full, visual field were full on confrontational test. Facial sensation and strength were normal.  Head turning and shoulder shrug  were normal and symmetric. ?Motor: The motor testing reveals 5 over 5 strength of all 4 extremities. Good symmetric motor tone is noted throughout.  ?Sensory: Sensory testing is intact to soft touch on all 4 extremities. No evidence of extinction is noted.  ?Coordination: Cerebellar testing reveals good finger-nose-finger and heel-to-shin bilaterally.  ?Gait and station: uses a walker when ambulating  ?Reflexes: Deep tendon reflexes are symmetric and normal bilaterally.  ? ?DIAGNOSTIC DATA (LABS, IMAGING, TESTING) ?- I reviewed patient records, labs, notes, testing and imaging myself where available. ? ?Lab Results  ?Component Value Date  ?  WBC 6.4 02/09/2022  ? HGB 9.9 (L) 02/09/2022  ? HCT 28.9 (L) 02/09/2022  ? MCV 91.7 02/09/2022  ? PLT 445 (H) 02/09/2022  ? ?   ?Component Value Date/Time  ? NA 141 02/09/2022 0340  ? K 4.1 02/09/2022 0340  ? CL 108 02/09/2022 0340  ? CO2 25 02/09/2022 0340  ? GLUCOSE 165 (H) 02/09/2022 0340  ? BUN 18 02/09/2022 0340  ? CREATININE 0.84 02/09/2022 0340  ? CREATININE 0.87 11/11/2021 1600  ? CALCIUM 8.7 (L) 02/09/2022 0340  ? PROT 5.6 (L) 02/04/2022 0114  ? ALBUMIN 2.7 (L) 02/04/2022 0114  ? AST 13 (L) 02/04/2022 0114  ? ALT 11 02/04/2022 0114  ? ALKPHOS 38 02/04/2022 0114  ? BILITOT 0.3 02/04/2022 0114  ? GFRNONAA >60 02/09/2022 0340  ? ?Lab Results  ?Component Value Date  ? CHOL 116 01/25/2022  ? HDL 60 01/25/2022  ? [place] 47 01/25/2022  ? TRIG 46 01/25/2022  ? CHOLHDL 1.9 01/25/2022  ? ?Lab Results  ?Component Value Date  ? HGBA1C 6.6 (H) 01/24/2022  ? ?Lab Results  ?Component Value Date  ? KDTOIZTI45 231 01/24/2022  ? ?Lab Results  ?Component Value Date  ? TSH 1.624 01/24/2022  ? ? ? ? ?ASSESSMENT AND PLAN ?61 y.o. year old male  has a past medical history of Alcohol dependence ([place]), Chronic calcific pancreatitis ([place]), CVA  (cerebral vascular accident) ([place]) (01/24/2022), Diabetes mellitus without complication ([place]), Hyperlipidemia, Hypertension, Melanoma ([place]) (2013), Pancreatic cyst-mass?, and Polyneuropathy. here with : ? ?Stroke:  acute

## 2022-03-06 ENCOUNTER — Encounter: Payer: Self-pay | Admitting: Podiatrist

## 2022-03-06 ENCOUNTER — Ambulatory Visit: Payer: BC Managed Care – PPO | Admitting: Podiatrist

## 2022-03-06 DIAGNOSIS — E1142 Type 2 diabetes mellitus with diabetic polyneuropathy: Secondary | ICD-10-CM | POA: Diagnosis not present

## 2022-03-06 DIAGNOSIS — B351 Tinea unguium: Secondary | ICD-10-CM

## 2022-03-06 DIAGNOSIS — E1169 Type 2 diabetes mellitus with other specified complication: Secondary | ICD-10-CM | POA: Diagnosis not present

## 2022-03-06 NOTE — Patient Instructions (Addendum)
Eucerin cream in a crock is a great moisturizer for your feet--- any type of foot or heel balm is also a good option.  ? ?  ? ?Diabetes Mellitus and Foot Care ?Foot care is an important part of your health, especially when you have diabetes. Diabetes may cause you to have problems because of poor blood flow (circulation) to your feet and legs, which can cause your skin to: ?Become thinner and drier. ?Break more easily. ?Heal more slowly. ?Peel and crack. ?You may also have nerve damage (neuropathy) in your legs and feet, causing decreased feeling in them. This means that you may not notice minor injuries to your feet that could lead to more serious problems. Noticing and addressing any potential problems early is the best way to prevent future foot problems. ?How to care for your feet ?Foot hygiene ? ?Wash your feet daily with warm water and mild soap. Do not use hot water. Then, pat your feet and the areas between your toes until they are completely dry. Do not soak your feet as this can dry your skin. ?Trim your toenails straight across. Do not dig under them or around the cuticle. File the edges of your nails with an emery board or nail file. ?Apply a moisturizing lotion or petroleum jelly to the skin on your feet and to dry, brittle toenails. Use lotion that does not contain alcohol and is unscented. Do not apply lotion between your toes. ?Shoes and socks ?Wear clean socks or stockings every day. Make sure they are not too tight. Do not wear knee-high stockings since they may decrease blood flow to your legs. ?Wear shoes that fit properly and have enough cushioning. Always look in your shoes before you put them on to be sure there are no objects inside. ?To break in new shoes, wear them for just a few hours a day. This prevents injuries on your feet. ?Wounds, scrapes, corns, and calluses ? ?Check your feet daily for blisters, cuts, bruises, sores, and redness. If you cannot see the bottom of your feet, use a  mirror or ask someone for help. ?Do not cut corns or calluses or try to remove them with medicine. ?If you find a minor scrape, cut, or break in the skin on your feet, keep it and the skin around it clean and dry. You may clean these areas with mild soap and water. Do not clean the area with peroxide, alcohol, or iodine. ?If you have a wound, scrape, corn, or callus on your foot, look at it several times a day to make sure it is healing and not infected. Check for: ?Redness, swelling, or pain. ?Fluid or blood. ?Warmth. ?Pus or a bad smell. ?General tips ?Do not cross your legs. This may decrease blood flow to your feet. ?Do not use heating pads or hot water bottles on your feet. They may burn your skin. If you have lost feeling in your feet or legs, you may not know this is happening until it is too late. ?Protect your feet from hot and cold by wearing shoes, such as at the beach or on hot pavement. ?Schedule a complete foot exam at least once a year (annually) or more often if you have foot problems. Report any cuts, sores, or bruises to your health care provider immediately. ?Where to find more information ?American Diabetes Association: www.diabetes.org ?Association of Diabetes Care & Education Specialists: www.diabeteseducator.org ?Contact a health care provider if: ?You have a medical condition that increases your risk of  infection and you have any cuts, sores, or bruises on your feet. ?You have an injury that is not healing. ?You have redness on your legs or feet. ?You feel burning or tingling in your legs or feet. ?You have pain or cramps in your legs and feet. ?Your legs or feet are numb. ?Your feet always feel cold. ?You have pain around any toenails. ?Get help right away if: ?You have a wound, scrape, corn, or callus on your foot and: ?You have pain, swelling, or redness that gets worse. ?You have fluid or blood coming from the wound, scrape, corn, or callus. ?Your wound, scrape, corn, or callus feels warm  to the touch. ?You have pus or a bad smell coming from the wound, scrape, corn, or callus. ?You have a fever. ?You have a red line going up your leg. ?Summary ?Check your feet every day for blisters, cuts, bruises, sores, and redness. ?Apply a moisturizing lotion or petroleum jelly to the skin on your feet and to dry, brittle toenails. ?Wear shoes that fit properly and have enough cushioning. ?If you have foot problems, report any cuts, sores, or bruises to your health care provider immediately. ?Schedule a complete foot exam at least once a year (annually) or more often if you have foot problems. ?This information is not intended to replace advice given to you by your health care provider. Make sure you discuss any questions you have with your health care provider. ?Document Revised: 06/10/2020 Document Reviewed: 06/10/2020 ?Elsevier Patient Education ? [place]. ? ?

## 2022-03-06 NOTE — Progress Notes (Signed)
Subjective: ?TAL Kenneth [name] is a 61 y.o. male patient with history of diabetes who presents to office today complaining of long,mildly painful nails  while ambulating in shoes; unable to trim.  Patient relates neuropathy and relates he generally has very little feeling to the bottom of both feet.  He also relates he has some dry skin as well.  He presents today with his sister who helps care for him. ? ?Patient Active Problem List  ? Diagnosis Date Noted  ? History of CVA (cerebrovascular accident) 02/28/2022  ? Pressure injury of skin 02/03/2022  ? Protein-calorie malnutrition, severe 02/03/2022  ? Gastritis and gastroduodenitis   ? Pancreatic cyst - mass 02/02/2022  ? Normocytic anemia 02/02/2022  ? Weight loss   ? Failure to thrive in adult 02/01/2022  ? Occult GI bleeding 02/01/2022  ? DNR (do not resuscitate) 02/01/2022  ? Dizziness 01/24/2022  ? ARF (acute renal failure) ([place]) 01/24/2022  ? Hyponatremia 01/24/2022  ? Elevated PSA 11/24/2021  ? Hypokalemia 11/24/2021  ? History of immunotherapy 11/25/2019  ? Hx of colonic polyps 03/28/2017  ? Melanoma metastatic to brain [facility]) 08/08/2016  ? 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[place]) 08/13/2014  ? Metastatic melanoma to lung ([place]) 12/12/2012  ? Diabetes mellitus without complication ([place]) 76/22/6333  ? Dyslipidemia 08/20/2007  ? Essential hypertension 08/19/2007  ? ?Current Outpatient Medications on File Prior to Visit  ?Medication Sig Dispense Refill  ? amLODipine (NORVASC) 10 MG tablet Take 10 mg by mouth daily.    ? aspirin EC 81 MG EC tablet Take 1 tablet (81 mg total) by mouth daily. Swallow whole. 30 tablet 11  ? atorvastatin (LIPITOR) 20 MG tablet Take 1 tablet (20 mg total) by mouth daily. 30 tablet 0  ? feeding supplement (ENSURE ENLIVE / ENSURE PLUS) LIQD Take 237 mLs by mouth 3 (three) times daily between meals. 237 mL 12  ? magnesium oxide (MAG-OX) 400 (240 Mg) MG tablet Take 1 tablet (400 mg total) by mouth 2 (two) times daily.    ? metFORMIN (GLUCOPHAGE) 1000 MG  tablet Take 1 tablet (1,000 mg total) by mouth 2 (two) times daily with a meal. 60 tablet 0  ? metoprolol tartrate (LOPRESSOR) 100 MG tablet Take 100 mg by mouth 2 (two) times daily.    ? mirtazapine (REMERON) 7.5 MG tablet Take 1 tablet (7.5 mg total) by mouth at bedtime. 90 tablet 3  ? Multiple Vitamin (MULTIVITAMIN WITH MINERALS) TABS tablet Take 1 tablet by mouth daily.    ? pantoprazole (PROTONIX) 40 MG tablet Take 1 tablet (40 mg total) by mouth daily. 90 tablet 3  ? polyethylene glycol (MIRALAX / GLYCOLAX) 17 g packet Take 17 g by mouth daily. 14 each 0  ? senna-docusate (SENOKOT-S) 8.6-50 MG tablet Take 2 tablets by mouth at bedtime.    ? ?No current facility-administered medications on file prior to visit.  ? ?Allergies  ?Allergen Reactions  ? Cat Hair Extract Shortness Of Breath and Swelling  ?  Swelling, watery eyes  ? ? ? ?Objective: ?General: Patient is awake, alert, and oriented x 3 and in no acute distress. ? ?Integument: Skin is warm, dry and supple bilateral. Nails are tender, long, thickened and  ?dystrophic with subungual debris, consistent with onychomycosis, 1-5 bilateral. No signs of infection. No open lesions or preulcerative lesions present bilateral. Remaining integument xerotic on the plantar aspect with areas of scale.  It does not appear to be fungal in nature. ? ?Vasculature:  Dorsalis Pedis pulse 1/4 bilateral. Posterior Tibial  pulse  1/4 bilateral.  ?Capillary fill time <3 sec 1-5 bilateral. Positive hair growth to the level of the digits. ?Temperature gradient within normal limits. No varicosities present bilateral. No edema present bilateral.  ? ?Neurology: The patient has absent sensation measured with a 5.07/10g Semmes Weinstein Monofilament . Vibratory sensation diminished bilateral with tuning fork. No Babinski sign present bilateral.  ? ?Musculoskeletal: High arch foot type bilateral is noted.  Contracture of lesser digits on the right foot is noted compared to the left.  No  symptomatic pedal deformities noted bilateral. Muscular strength 5/5 in all lower extremity muscular groups bilateral without pain on range of motion . No tenderness with calf compression bilateral. ? ?Assessment and Plan: ? ?  ICD-10-CM   ?1. Onychomycosis of multiple toenails with type 2 diabetes mellitus and peripheral neuropathy (HCC)  E11.42   ? B35.1   ? E11.69   ?  ? ? ? ?-Examined patient. ?-Discussed and educated patient on diabetic foot care, especially with  ?regards to the vascular, neurological and musculoskeletal systems.  ?-Stressed the importance of good glycemic control and the detriment of not  ?controlling glucose levels in relation to the foot. ?-Mechanically debrided all nails 1-5 bilateral using sterile nail nipper and filed with dremel without incident  ?-Answered all patient questions ?-Patient to return  in 3 months for at risk foot care ?-Patient advised to call the office if any problems or questions arise in the meantime. ? ?Bronson Ing, DPM ?

## 2022-03-09 NOTE — Progress Notes (Signed)
I agree with the above plan 

## 2022-03-11 ENCOUNTER — Other Ambulatory Visit: Payer: Self-pay | Admitting: Family Medicine

## 2022-03-13 DIAGNOSIS — E43 Unspecified severe protein-calorie malnutrition: Secondary | ICD-10-CM | POA: Diagnosis not present

## 2022-03-13 DIAGNOSIS — F1020 Alcohol dependence, uncomplicated: Secondary | ICD-10-CM | POA: Diagnosis not present

## 2022-03-13 DIAGNOSIS — R296 Repeated falls: Secondary | ICD-10-CM | POA: Diagnosis not present

## 2022-03-13 DIAGNOSIS — I10 Essential (primary) hypertension: Secondary | ICD-10-CM | POA: Diagnosis not present

## 2022-03-13 DIAGNOSIS — N390 Urinary tract infection, site not specified: Secondary | ICD-10-CM | POA: Diagnosis not present

## 2022-03-13 DIAGNOSIS — C7931 Secondary malignant neoplasm of brain: Secondary | ICD-10-CM | POA: Diagnosis not present

## 2022-03-13 DIAGNOSIS — R627 Adult failure to thrive: Secondary | ICD-10-CM | POA: Diagnosis not present

## 2022-03-13 DIAGNOSIS — E1142 Type 2 diabetes mellitus with diabetic polyneuropathy: Secondary | ICD-10-CM | POA: Diagnosis not present

## 2022-03-13 DIAGNOSIS — E785 Hyperlipidemia, unspecified: Secondary | ICD-10-CM | POA: Diagnosis not present

## 2022-03-13 DIAGNOSIS — C7800 Secondary malignant neoplasm of unspecified lung: Secondary | ICD-10-CM | POA: Diagnosis not present

## 2022-03-13 DIAGNOSIS — N179 Acute kidney failure, unspecified: Secondary | ICD-10-CM | POA: Diagnosis not present

## 2022-03-13 DIAGNOSIS — D630 Anemia in neoplastic disease: Secondary | ICD-10-CM | POA: Diagnosis not present

## 2022-03-13 DIAGNOSIS — K5732 Diverticulitis of large intestine without perforation or abscess without bleeding: Secondary | ICD-10-CM | POA: Diagnosis not present

## 2022-03-13 DIAGNOSIS — B9620 Unspecified Escherichia coli [E. coli] as the cause of diseases classified elsewhere: Secondary | ICD-10-CM | POA: Diagnosis not present

## 2022-03-13 DIAGNOSIS — K861 Other chronic pancreatitis: Secondary | ICD-10-CM | POA: Diagnosis not present

## 2022-03-14 DIAGNOSIS — R627 Adult failure to thrive: Secondary | ICD-10-CM | POA: Diagnosis not present

## 2022-03-14 DIAGNOSIS — E1142 Type 2 diabetes mellitus with diabetic polyneuropathy: Secondary | ICD-10-CM | POA: Diagnosis not present

## 2022-03-14 DIAGNOSIS — F1020 Alcohol dependence, uncomplicated: Secondary | ICD-10-CM | POA: Diagnosis not present

## 2022-03-14 DIAGNOSIS — R296 Repeated falls: Secondary | ICD-10-CM | POA: Diagnosis not present

## 2022-03-14 DIAGNOSIS — E43 Unspecified severe protein-calorie malnutrition: Secondary | ICD-10-CM | POA: Diagnosis not present

## 2022-03-14 DIAGNOSIS — C7800 Secondary malignant neoplasm of unspecified lung: Secondary | ICD-10-CM | POA: Diagnosis not present

## 2022-03-14 DIAGNOSIS — N179 Acute kidney failure, unspecified: Secondary | ICD-10-CM | POA: Diagnosis not present

## 2022-03-14 DIAGNOSIS — I10 Essential (primary) hypertension: Secondary | ICD-10-CM | POA: Diagnosis not present

## 2022-03-14 DIAGNOSIS — D630 Anemia in neoplastic disease: Secondary | ICD-10-CM | POA: Diagnosis not present

## 2022-03-14 DIAGNOSIS — C7931 Secondary malignant neoplasm of brain: Secondary | ICD-10-CM | POA: Diagnosis not present

## 2022-03-14 DIAGNOSIS — K861 Other chronic pancreatitis: Secondary | ICD-10-CM | POA: Diagnosis not present

## 2022-03-14 DIAGNOSIS — B9620 Unspecified Escherichia coli [E. coli] as the cause of diseases classified elsewhere: Secondary | ICD-10-CM | POA: Diagnosis not present

## 2022-03-14 DIAGNOSIS — E785 Hyperlipidemia, unspecified: Secondary | ICD-10-CM | POA: Diagnosis not present

## 2022-03-14 DIAGNOSIS — K5732 Diverticulitis of large intestine without perforation or abscess without bleeding: Secondary | ICD-10-CM | POA: Diagnosis not present

## 2022-03-14 DIAGNOSIS — N390 Urinary tract infection, site not specified: Secondary | ICD-10-CM | POA: Diagnosis not present

## 2022-03-15 ENCOUNTER — Encounter: Payer: Self-pay | Admitting: Internal Medicine

## 2022-03-15 ENCOUNTER — Ambulatory Visit: Payer: BC Managed Care – PPO | Admitting: Internal Medicine

## 2022-03-15 ENCOUNTER — Other Ambulatory Visit (INDEPENDENT_AMBULATORY_CARE_PROVIDER_SITE_OTHER): Payer: BC Managed Care – PPO

## 2022-03-15 VITALS — BP 112/62 | HR 92 | Ht 64.0 in | Wt 130.0 lb

## 2022-03-15 DIAGNOSIS — K2990 Gastroduodenitis, unspecified, without bleeding: Secondary | ICD-10-CM | POA: Diagnosis not present

## 2022-03-15 DIAGNOSIS — R972 Elevated prostate specific antigen [PSA]: Secondary | ICD-10-CM

## 2022-03-15 DIAGNOSIS — Z85820 Personal history of malignant melanoma of skin: Secondary | ICD-10-CM

## 2022-03-15 DIAGNOSIS — K862 Cyst of pancreas: Secondary | ICD-10-CM

## 2022-03-15 DIAGNOSIS — K5900 Constipation, unspecified: Secondary | ICD-10-CM | POA: Diagnosis not present

## 2022-03-15 DIAGNOSIS — K2970 Gastritis, unspecified, without bleeding: Secondary | ICD-10-CM

## 2022-03-15 DIAGNOSIS — R351 Nocturia: Secondary | ICD-10-CM | POA: Diagnosis not present

## 2022-03-15 DIAGNOSIS — K860 Alcohol-induced chronic pancreatitis: Secondary | ICD-10-CM

## 2022-03-15 DIAGNOSIS — R350 Frequency of micturition: Secondary | ICD-10-CM | POA: Diagnosis not present

## 2022-03-15 DIAGNOSIS — N3941 Urge incontinence: Secondary | ICD-10-CM | POA: Diagnosis not present

## 2022-03-15 LAB — COMPREHENSIVE METABOLIC PANEL
ALT: 9 U/L (ref 0–53)
AST: 11 U/L (ref 0–37)
Albumin: 3.8 g/dL (ref 3.5–5.2)
Alkaline Phosphatase: 41 U/L (ref 39–117)
BUN: 14 mg/dL (ref 6–23)
CO2: 28 mEq/L (ref 19–32)
Calcium: 9 mg/dL (ref 8.4–10.5)
Chloride: 103 mEq/L (ref 96–112)
Creatinine, Ser: 0.68 mg/dL (ref 0.40–1.50)
GFR: 100.68 mL/min (ref >60.00)
Glucose, Bld: 114 mg/dL — ABNORMAL HIGH (ref 70–99)
Potassium: 3.4 mEq/L — ABNORMAL LOW (ref 3.5–5.1)
Sodium: 141 mEq/L (ref 135–145)
Total Bilirubin: 0.4 mg/dL (ref 0.2–1.2)
Total Protein: 6.4 g/dL (ref 6.0–8.3)

## 2022-03-15 LAB — CBC WITH DIFFERENTIAL/PLATELET
Basophils Absolute: 0.1 10*3/uL (ref 0.0–0.1)
Basophils Relative: 0.9 % (ref 0.0–3.0)
Eosinophils Absolute: 0.4 10*3/uL (ref 0.0–0.7)
Eosinophils Relative: 4.2 % (ref 0.0–5.0)
HCT: 31.3 % — ABNORMAL LOW (ref 39.0–52.0)
Hemoglobin: 11 g/dL — ABNORMAL LOW (ref 13.0–17.0)
Lymphocytes Relative: 24.9 % (ref 12.0–46.0)
Lymphs Abs: 2.2 10*3/uL (ref 0.7–4.0)
MCHC: 35.2 g/dL (ref 30.0–36.0)
MCV: 89.6 fl (ref 78.0–100.0)
Monocytes Absolute: 0.8 10*3/uL (ref 0.1–1.0)
Monocytes Relative: 9.7 % (ref 3.0–12.0)
Neutro Abs: 5.2 10*3/uL (ref 1.4–7.7)
Neutrophils Relative %: 60.3 % (ref 43.0–77.0)
Platelets: 338 10*3/uL (ref 150.0–400.0)
RBC: 3.5 Mil/uL — ABNORMAL LOW (ref 4.22–5.81)
RDW: 13.1 % (ref 11.5–15.5)
WBC: 8.7 10*3/uL (ref 4.0–10.5)

## 2022-03-15 NOTE — Patient Instructions (Signed)
Your provider has requested that you go to the basement level for lab work before leaving today. Press "B" on the elevator. The lab is located at the first door on the left as you exit the elevator. ? ?Due to recent changes in healthcare laws, you may see the results of your imaging and laboratory studies on MyChart before your provider has had a chance to review them.  We understand that in some cases there may be results that are confusing or concerning to you. Not all laboratory results come back in the same time frame and the provider may be waiting for multiple results in order to interpret others.  Please give [place] 48 hours in order for your provider to thoroughly review all the results before contacting the office for clarification of your results.  ? ?You have been scheduled for an MRI at [place] [facility] on 03/29/2022. Your appointment time is 8:00am. Please arrive at  [id number] make certain not to have anything to eat or drink 64 hours prior to your test. In addition, if you have any metal in your body, have a pacemaker or defibrillator, please be sure to let your ordering physician know. This test typically takes 45 minutes to 1 hour to complete. Should you need to reschedule, please call [phone] to do so. ? ?Take miralax daily and today take 1 or 2 over the counter dulcolax. ? ?I appreciate the opportunity to care for you. ?[name], MD, [facility] ? ?

## 2022-03-15 NOTE — Progress Notes (Signed)
? ?Kenneth [name] 61 y.o. [birth year] [id number] ? ?Assessment & Plan:  ? ?Encounter Diagnoses  ?Name Primary?  ? Pancreatic cyst - mass Yes  ? Gastritis and gastroduodenitis   ? Constipation, unspecified constipation type   ? Alcohol-induced chronic pancreatitis ([place])   ? Elevated PSA   ? History of melanoma-stage IV   ? ?The patient is improved.  Mirtazapine seems to be helping as well as PPI for gastritis and duodenitis.  We will continue these for now and I think he could probably reduce or come off the PPI in the future. ? ?The more pressing issue is to try to understand what this mass/cyst of the pancreas is.  We will schedule an MRI with and without contrast for that.  Recheck labs a CBC and CMET today.  Continue multivitamin.  Presumably that has some B12 entities B12 was low normal so that we will benefit him.  Ferritin was high in the hospital consistent with acute phase reactant most likely. ? ?I have explained that depending upon what the MRI shows a could be observation with imaging, EUS plus or minus FNA and even a surgical recommendation. ? ?He is advised to take 2 Dulcolax today and to get back on MiraLAX daily and take that regularly with respect to his constipation issues. ? ?CC: Haydee Salter, MD ? ? ? ?Subjective:  ? ?Chief Complaint: ? ?HPI ?61 year old white man here with one of his sisters today, history of alcoholism and presented to the hospital in March with failure to thrive in the setting of prior melanoma (stage IV suspected to be cured) and imaging detected a 3.4 x 3.6 cm cm cystic mass lesion in the head/uncinate process area.  He had some pancreatic ductal dilation up to 7 mm and changes of chronic pancreatitis with some calcifications in the head and body.  EGD performed by me (also had a heme positive stool with anemia) demonstrated some gastritis and duodenitis and a colonoscopy showed hemorrhoids and diverticulosis but no other abnormalities.  Pathology on the stomach was  without abnormality and because of erythema in the esophagus I biopsied that and it was normal as well.  A CA 19-9 level was 13.  Note that in December he had a PSA of 5.28 and it was 1.77 in 2017 he is going to see urology today.  He is improving sleep is better but not good yet.  Appetite is better.  He is not drinking still.  Living with one of his other sisters.  He is on mirtazapine 7.5 mg daily which I had recommended during the hospitalization (helps nausea anorexia and insomnia). ?He was prescribed or recommended to take MiraLAX but he has not been taking it regularly and has been 4 days without a bowel movement.  He has been using some Senokot intermittently. ?Wt Readings from Last 3 Encounters:  ?03/15/22 130 lb (59 kg)  ?03/03/22 132 lb (59.9 kg)  ?02/28/22 130 lb 6.4 oz (59.1 kg)  ? ? ?Allergies  ?Allergen Reactions  ? Cat Hair Extract Shortness Of Breath and Swelling  ?  Swelling, watery eyes  ? ?Current Meds  ?Medication Sig  ? amLODipine (NORVASC) 10 MG tablet TAKE 1 TABLET BY MOUTH EVERY DAY  ? aspirin EC 81 MG EC tablet Take 1 tablet (81 mg total) by mouth daily. Swallow whole.  ? atorvastatin (LIPITOR) 20 MG tablet TAKE 1 TABLET BY MOUTH DAILY  ? feeding supplement (ENSURE ENLIVE / ENSURE PLUS) LIQD Take 237 mLs by  mouth 3 (three) times daily between meals.  ? magnesium oxide (MAG-OX) 400 (240 Mg) MG tablet Take 1 tablet (400 mg total) by mouth 2 (two) times daily.  ? metFORMIN (GLUCOPHAGE) 1000 MG tablet TAKE 1 TABLET BY MOUTH TWICE DAILY  ? metoprolol tartrate (LOPRESSOR) 100 MG tablet TAKE 1 TABLET BY MOUTH TWICE DAILY  ? mirtazapine (REMERON) 7.5 MG tablet Take 1 tablet (7.5 mg total) by mouth at bedtime.  ? Multiple Vitamin (MULTIVITAMIN WITH MINERALS) TABS tablet Take 1 tablet by mouth daily.  ? pantoprazole (PROTONIX) 40 MG tablet Take 1 tablet (40 mg total) by mouth daily.  ? senna-docusate (SENOKOT-S) 8.6-50 MG tablet Take 2 tablets by mouth at bedtime.  ? ?Past Medical History:   ?Diagnosis Date  ? Alcohol dependence ([place])   ? Chronic calcific pancreatitis ([place])   ? CVA (cerebral vascular accident) ([place]) 01/24/2022  ? Diabetes mellitus without complication ([place])   ? Hyperlipidemia   ? Hypertension   ? Melanoma ([place]) 2013  ? metastatic - resected and cured with chemo/immuno tx  ? Pancreatic cyst-mass?   ? Polyneuropathy   ? ?Past Surgical History:  ?Procedure Laterality Date  ? BIOPSY  02/03/2022  ? Procedure: BIOPSY;  Surgeon: Gatha Mayer, MD;  Location: [facility] ENDOSCOPY;  Service: Gastroenterology;;  ? BRAIN SURGERY  2015  ? to check for possible malignancy from melanoma, result were scar tissue  ? COLONOSCOPY WITH PROPOFOL N/A 02/03/2022  ? Procedure: COLONOSCOPY WITH PROPOFOL;  Surgeon: Gatha Mayer, MD;  Location: [place] [facility];  Service: Gastroenterology;  Laterality: N/A;  ? ESOPHAGOGASTRODUODENOSCOPY (EGD) WITH PROPOFOL N/A 02/03/2022  ? Procedure: ESOPHAGOGASTRODUODENOSCOPY (EGD) WITH PROPOFOL;  Surgeon: Gatha Mayer, MD;  Location: [facility];  Service: Gastroenterology;  Laterality: N/A;  ? melanoma exicison    ? right leg  ? ?Social History  ? ?Social History Narrative  ? Single no children  ? 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 No drug use not smoking.  History of snuff.  No alcohol (previously heavy).  ? ?family history includes Prostate cancer in his father; Stomach cancer in his paternal grandmother. ? ? ?Review of Systems ? ?See HPI ?Objective:  ? Physical Exam ?'@BP'$  112/62   Pulse 92   Ht '5\' 4"'$  (1.626 m)   Wt 130 lb (59 kg)   BMI 22.31 kg/m? @ ? ?General:  NAD ?Eyes:   anicteric ?Lungs:  clear ?Heart::  S1S2 no rubs, murmurs or gallops ?Abdomen:  soft and nontender, BS+ ?Neuro/psych: Alert and oriented, normal mood and affect. ? ? ? ?Data Reviewed:  ?See HPI I have reviewed recent primary care notes with Dr. Gena Fray as well as hospitalization records ? ?

## 2022-03-17 DIAGNOSIS — R296 Repeated falls: Secondary | ICD-10-CM | POA: Diagnosis not present

## 2022-03-17 DIAGNOSIS — B9620 Unspecified Escherichia coli [E. coli] as the cause of diseases classified elsewhere: Secondary | ICD-10-CM | POA: Diagnosis not present

## 2022-03-17 DIAGNOSIS — I10 Essential (primary) hypertension: Secondary | ICD-10-CM | POA: Diagnosis not present

## 2022-03-17 DIAGNOSIS — F1020 Alcohol dependence, uncomplicated: Secondary | ICD-10-CM | POA: Diagnosis not present

## 2022-03-17 DIAGNOSIS — D630 Anemia in neoplastic disease: Secondary | ICD-10-CM | POA: Diagnosis not present

## 2022-03-17 DIAGNOSIS — N390 Urinary tract infection, site not specified: Secondary | ICD-10-CM | POA: Diagnosis not present

## 2022-03-17 DIAGNOSIS — R627 Adult failure to thrive: Secondary | ICD-10-CM | POA: Diagnosis not present

## 2022-03-17 DIAGNOSIS — C7931 Secondary malignant neoplasm of brain: Secondary | ICD-10-CM | POA: Diagnosis not present

## 2022-03-17 DIAGNOSIS — C7800 Secondary malignant neoplasm of unspecified lung: Secondary | ICD-10-CM | POA: Diagnosis not present

## 2022-03-17 DIAGNOSIS — K5732 Diverticulitis of large intestine without perforation or abscess without bleeding: Secondary | ICD-10-CM | POA: Diagnosis not present

## 2022-03-17 DIAGNOSIS — E1142 Type 2 diabetes mellitus with diabetic polyneuropathy: Secondary | ICD-10-CM | POA: Diagnosis not present

## 2022-03-17 DIAGNOSIS — E43 Unspecified severe protein-calorie malnutrition: Secondary | ICD-10-CM | POA: Diagnosis not present

## 2022-03-17 DIAGNOSIS — K861 Other chronic pancreatitis: Secondary | ICD-10-CM | POA: Diagnosis not present

## 2022-03-17 DIAGNOSIS — E785 Hyperlipidemia, unspecified: Secondary | ICD-10-CM | POA: Diagnosis not present

## 2022-03-17 DIAGNOSIS — N179 Acute kidney failure, unspecified: Secondary | ICD-10-CM | POA: Diagnosis not present

## 2022-03-20 ENCOUNTER — Telehealth: Payer: Self-pay | Admitting: Adult Health

## 2022-03-20 DIAGNOSIS — N179 Acute kidney failure, unspecified: Secondary | ICD-10-CM | POA: Diagnosis not present

## 2022-03-20 DIAGNOSIS — N390 Urinary tract infection, site not specified: Secondary | ICD-10-CM | POA: Diagnosis not present

## 2022-03-20 DIAGNOSIS — B9620 Unspecified Escherichia coli [E. coli] as the cause of diseases classified elsewhere: Secondary | ICD-10-CM | POA: Diagnosis not present

## 2022-03-20 DIAGNOSIS — D630 Anemia in neoplastic disease: Secondary | ICD-10-CM | POA: Diagnosis not present

## 2022-03-20 DIAGNOSIS — C7800 Secondary malignant neoplasm of unspecified lung: Secondary | ICD-10-CM | POA: Diagnosis not present

## 2022-03-20 DIAGNOSIS — E43 Unspecified severe protein-calorie malnutrition: Secondary | ICD-10-CM | POA: Diagnosis not present

## 2022-03-20 DIAGNOSIS — R627 Adult failure to thrive: Secondary | ICD-10-CM | POA: Diagnosis not present

## 2022-03-20 DIAGNOSIS — K5732 Diverticulitis of large intestine without perforation or abscess without bleeding: Secondary | ICD-10-CM | POA: Diagnosis not present

## 2022-03-20 DIAGNOSIS — F1020 Alcohol dependence, uncomplicated: Secondary | ICD-10-CM | POA: Diagnosis not present

## 2022-03-20 DIAGNOSIS — K861 Other chronic pancreatitis: Secondary | ICD-10-CM | POA: Diagnosis not present

## 2022-03-20 DIAGNOSIS — R296 Repeated falls: Secondary | ICD-10-CM | POA: Diagnosis not present

## 2022-03-20 DIAGNOSIS — E1142 Type 2 diabetes mellitus with diabetic polyneuropathy: Secondary | ICD-10-CM | POA: Diagnosis not present

## 2022-03-20 DIAGNOSIS — E785 Hyperlipidemia, unspecified: Secondary | ICD-10-CM | POA: Diagnosis not present

## 2022-03-20 DIAGNOSIS — C7931 Secondary malignant neoplasm of brain: Secondary | ICD-10-CM | POA: Diagnosis not present

## 2022-03-20 DIAGNOSIS — I10 Essential (primary) hypertension: Secondary | ICD-10-CM | POA: Diagnosis not present

## 2022-03-20 NOTE — Telephone Encounter (Signed)
Mary at [facility] called in regards to pt cardiac event monitor. ?[place] requesting to receive order to get him set up, needs to know if there are special transmission instructions.  ?[phone] ?

## 2022-03-20 NOTE — Telephone Encounter (Signed)
Mr Borum ?Ward Givens, NP ordered an event monitor for you. I have registered this online to be mailed to your home address. Once you receive it, if you have any questions please call our office or the monitor company. A booklet is included inside the box with detailed instructions.  ?Valetta Fuller  ?[place]  ?[phone]  ?  ?[employer] Cardiac Event Monitor Instructions ?Your physician has requested you wear your cardiac event monitor for _30_ days, (1-30). ?[employer] may call or text to confirm a shipping address. The monitor will be sent to a land address via UPS. [employer] will not ship a monitor to a PO BOX. It typically takes 3-5 days to receive your monitor after it has been enrolled. [employer] will assist with USPS tracking if your package is delayed. The telephone number for [employer] is [phone]. ?Once you have received your monitor, please review the enclosed instructions. Instruction tutorials can also be viewed under help and settings on the enclosed cell phone. Your monitor has already been registered assigning a specific monitor serial # to you. ?  ?Applying the monitor ?Remove cell phone from case and turn it on. The cell phone works as Dealer and needs to be within [employer] of you at all times. The cell phone will need to be charged on a daily basis. We recommend you plug the cell phone into the enclosed charger at your bedside table every night. ?  ?Monitor batteries: You will receive two monitor batteries labelled #1 and #2. These are your recorders. Plug battery #2 onto the second connection on the enclosed charger. Keep one battery on the charger at all times. This will keep the monitor battery deactivated. It will also keep it fully charged for when you need to switch your monitor batteries. A small light will be blinking on the battery emblem when it is charging. The light on the battery emblem will remain on when the battery is fully charged. ?  ?Open package of a  Monitor strip. Insert battery #1 into black hood on strip and gently squeeze monitor battery onto connection as indicated in instruction booklet. Set aside while preparing skin. ?  ?Choose location for your strip, vertical or horizontal, as indicated in the instruction booklet. Shave to remove all hair from location. There cannot be any lotions, oils, powders, or colognes on skin where monitor is to be applied. Wipe skin clean with enclosed Saline wipe. Dry skin completely. ?  ?Peel paper labeled #1 off the back of the Monitor strip exposing the adhesive. Place the monitor on the chest in the vertical or horizontal position shown in the instruction booklet. One arrow on the monitor strip must be pointing upward. Carefully remove paper labeled #2, attaching remainder of strip to your skin. Try not to create any folds or wrinkles in the strip as you apply it. ?  ?Firmly press and release the circle in the center of the monitor battery. You will hear a small beep. This is turning the monitor battery on. The heart emblem on the monitor battery will light up every 5 seconds if the monitor battery in turned on and connected to the patient securely. Do not push and hold the circle down as this turns the monitor battery off. The cell phone will locate the monitor battery. A screen will appear on the cell phone checking the connection of your monitor strip. This may read poor connection initially but change to good connection within the next minute. Once your monitor accepts the  connection you will hear a series of 3 beeps followed by a climbing crescendo of beeps. A screen will appear on the cell phone showing the two monitor strip placement options. Touch the picture that demonstrates where you applied the monitor strip. ?  ?Your monitor strip and battery are waterproof. You are able to shower, bathe, or swim with the monitor on. They just ask you do not submerge deeper than 3 feet underwater. We recommend removing the  monitor if you are swimming in a lake, river, or ocean. ?  ?Your monitor battery will need to be switched to a fully charged monitor battery approximately once a week. The cell phone will alert you of an action which needs to be made. ?  ?On the cell phone, tap for details to reveal connection status, monitor battery status, and cell phone battery status. The green dots indicates your monitor is in good status. A red dot indicates there is something that needs your attention. ?  ?To record a symptom, click the circle on the monitor battery. In 30-60 seconds a list of symptoms will appear on the cell phone. Select your symptom and tap save. Your monitor will record a sustained or significant arrhythmia regardless of you clicking the button. Some patients do not feel the heart rhythm irregularities. [employer] will notify us of any serious or critical events. ?  ?Refer to instruction booklet for instructions on switching batteries, changing strips, the Do not disturb or Pause features, or any additional questions. ?  ?Call [employer] at [phone], to confirm your monitor is transmitting and record your baseline. They will answer any questions you may have regarding the monitor instructions at that time. ?  ?Returning the monitor to [employer] ?Place all equipment back into blue box. Peel off strip of paper to expose adhesive and close box securely. There is a prepaid UPS shipping label on this box. Drop in a UPS drop box, or at a UPS facility  ? ?Last read by [name] at  9:40 AM on 03/15/2022. ?

## 2022-03-22 NOTE — Telephone Encounter (Signed)
Per Jinny Blossom NP, ok for [place] to assist patient with putting on heart monitor. I called Stanton Kidney back at [phone] and gave verbal order for this. Instructions were faxed to her at [phone]. Received a receipt of confirmation. She verbalized appreciation for the call.  ?

## 2022-03-27 ENCOUNTER — Ambulatory Visit (INDEPENDENT_AMBULATORY_CARE_PROVIDER_SITE_OTHER): Payer: BC Managed Care – PPO

## 2022-03-27 DIAGNOSIS — I4891 Unspecified atrial fibrillation: Secondary | ICD-10-CM | POA: Diagnosis not present

## 2022-03-27 DIAGNOSIS — E785 Hyperlipidemia, unspecified: Secondary | ICD-10-CM | POA: Diagnosis not present

## 2022-03-27 DIAGNOSIS — N390 Urinary tract infection, site not specified: Secondary | ICD-10-CM | POA: Diagnosis not present

## 2022-03-27 DIAGNOSIS — R627 Adult failure to thrive: Secondary | ICD-10-CM | POA: Diagnosis not present

## 2022-03-27 DIAGNOSIS — C7800 Secondary malignant neoplasm of unspecified lung: Secondary | ICD-10-CM | POA: Diagnosis not present

## 2022-03-27 DIAGNOSIS — I10 Essential (primary) hypertension: Secondary | ICD-10-CM | POA: Diagnosis not present

## 2022-03-27 DIAGNOSIS — E1142 Type 2 diabetes mellitus with diabetic polyneuropathy: Secondary | ICD-10-CM | POA: Diagnosis not present

## 2022-03-27 DIAGNOSIS — I639 Cerebral infarction, unspecified: Secondary | ICD-10-CM

## 2022-03-27 DIAGNOSIS — D630 Anemia in neoplastic disease: Secondary | ICD-10-CM | POA: Diagnosis not present

## 2022-03-27 DIAGNOSIS — Z8673 Personal history of transient ischemic attack (TIA), and cerebral infarction without residual deficits: Secondary | ICD-10-CM

## 2022-03-27 DIAGNOSIS — F1020 Alcohol dependence, uncomplicated: Secondary | ICD-10-CM | POA: Diagnosis not present

## 2022-03-27 DIAGNOSIS — C7931 Secondary malignant neoplasm of brain: Secondary | ICD-10-CM | POA: Diagnosis not present

## 2022-03-27 DIAGNOSIS — K5732 Diverticulitis of large intestine without perforation or abscess without bleeding: Secondary | ICD-10-CM | POA: Diagnosis not present

## 2022-03-27 DIAGNOSIS — N179 Acute kidney failure, unspecified: Secondary | ICD-10-CM | POA: Diagnosis not present

## 2022-03-27 DIAGNOSIS — E43 Unspecified severe protein-calorie malnutrition: Secondary | ICD-10-CM | POA: Diagnosis not present

## 2022-03-27 DIAGNOSIS — R296 Repeated falls: Secondary | ICD-10-CM | POA: Diagnosis not present

## 2022-03-27 DIAGNOSIS — K861 Other chronic pancreatitis: Secondary | ICD-10-CM | POA: Diagnosis not present

## 2022-03-27 DIAGNOSIS — B9620 Unspecified Escherichia coli [E. coli] as the cause of diseases classified elsewhere: Secondary | ICD-10-CM | POA: Diagnosis not present

## 2022-03-29 ENCOUNTER — Ambulatory Visit
Admission: RE | Admit: 2022-03-29 | Discharge: 2022-03-29 | Disposition: A | Discharge status: Home / Self Care | Payer: BC Managed Care – PPO | Source: Ambulatory Visit | Attending: Internal Medicine | Admitting: Internal Medicine

## 2022-03-29 DIAGNOSIS — K862 Cyst of pancreas: Secondary | ICD-10-CM

## 2022-03-29 DIAGNOSIS — K7689 Other specified diseases of liver: Secondary | ICD-10-CM | POA: Diagnosis not present

## 2022-03-29 DIAGNOSIS — C439 Malignant melanoma of skin, unspecified: Secondary | ICD-10-CM | POA: Diagnosis not present

## 2022-03-29 DIAGNOSIS — K449 Diaphragmatic hernia without obstruction or gangrene: Secondary | ICD-10-CM | POA: Diagnosis not present

## 2022-03-29 DIAGNOSIS — K8689 Other specified diseases of pancreas: Secondary | ICD-10-CM | POA: Diagnosis not present

## 2022-03-29 IMAGING — MR MR ABDOMEN WO/W CM
13 of 22 series · 24 of 48 positions shown · IV contrast (12 ml Multihance)
Comparison: Multiple exams, including CT scan [DATE] and
[DATE]

CLINICAL DATA: Metastatic melanoma. Pancreatic mass for further
characterization.

EXAM:
MRI ABDOMEN WITHOUT AND WITH CONTRAST
TECHNIQUE: Multiplanar multisequence MR imaging of the abdomen was performed
both before and after the administration of intravenous contrast.
CONTRAST:  12mL MULTIHANCE GADOBENATE DIMEGLUMINE 529 MG/ML IV SOLN

[Series 3: cor haste · coronal · 5.0mm · 0.74mm/px · 1 of 37 slices shown]
[im 1/37]
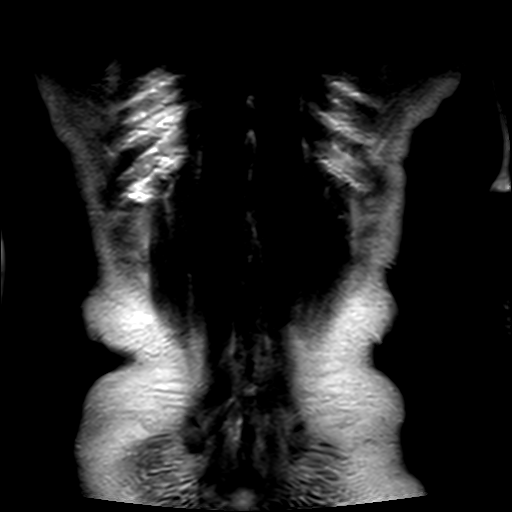

[Series 4: axial haste · axial · 6.0mm · 0.70mm/px · 1 of 35 slices shown]
[im 1/35]
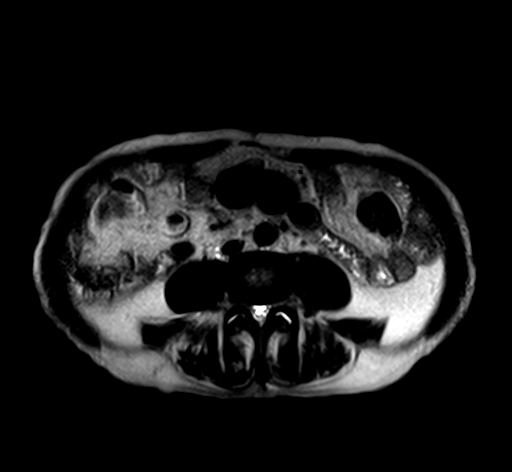

[Series 5: T1 · axial · 6.0mm · 0.70mm/px · 1 of 70 slices shown]
[im 1/70]
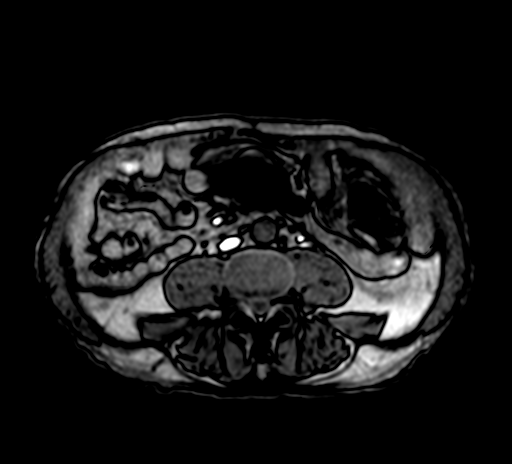

[Series 6: T2 · coronal · 3.5mm · 0.70mm/px · 1 of 58 slices shown (1 of 3)]
[im 1/58]
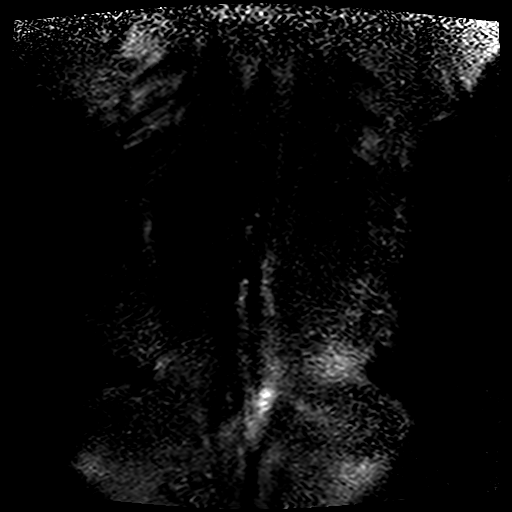

[Series 7: T2 · axial · 6.0mm · 1.12mm/px · 1 of 41 slices shown (2 of 3)]
[im 1/41]
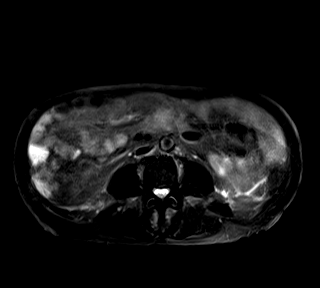

[Series 8: [id number]_trig · axial · 6.0mm · 1.82mm/px · z∈[-124,+136]mm · 3 of 110 slices shown]
[im 1/110]
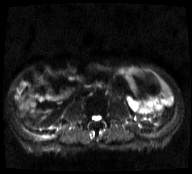
[im 55/110]
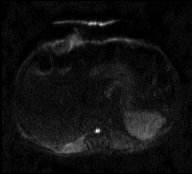
[im 110/110]
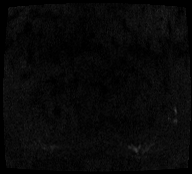

[Series 9: [id number]_trig_adc · axial · 6.0mm · 1.82mm/px · 1 of 37 slices shown]
[im 1/37]
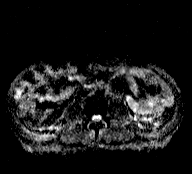

[Series 10: T2 · axial · 6.0mm · 1.19mm/px · 1 of 41 slices shown (3 of 3)]
[im 1/41]
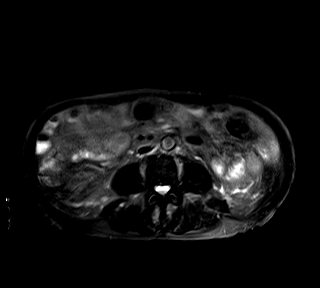

[Series 15: T1 dynamic · axial · non-contrast · 3.0mm · 0.70mm/px · z∈[-141,+96]mm · 3 of 80 slices shown]
[im 1/80]
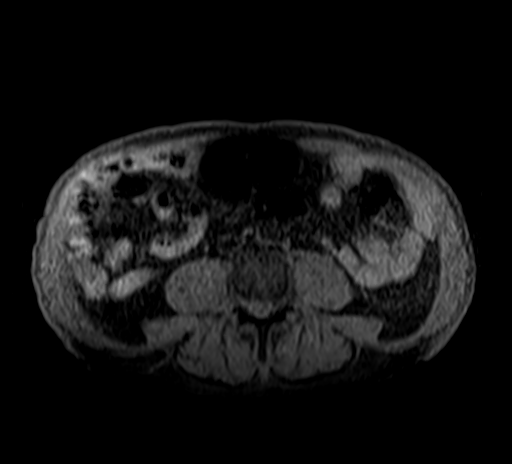
[im 40/80]
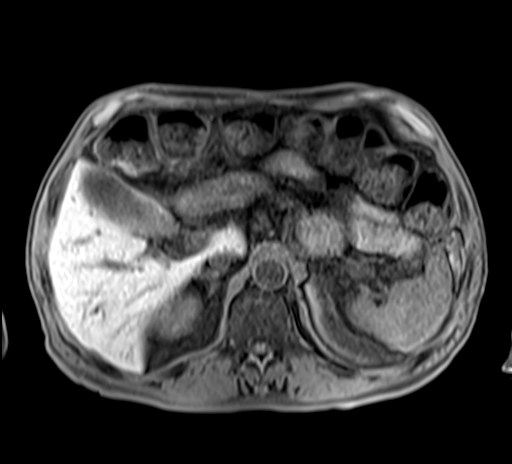
[im 80/80]
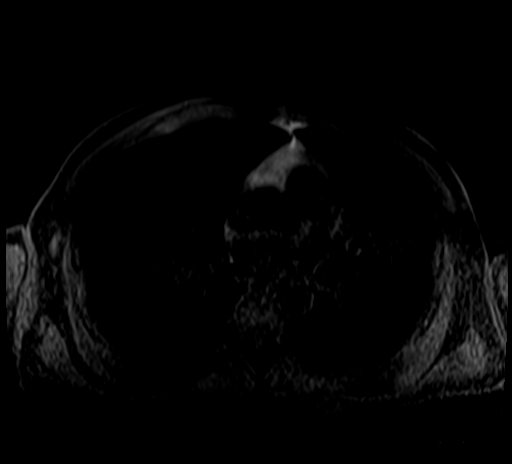

[Series 16: T1 dynamic post-contrast · axial · 3.0mm · 0.70mm/px · z∈[-141,+96]mm · 3 of 80 slices shown (1 of 4)]
[im 1/80]
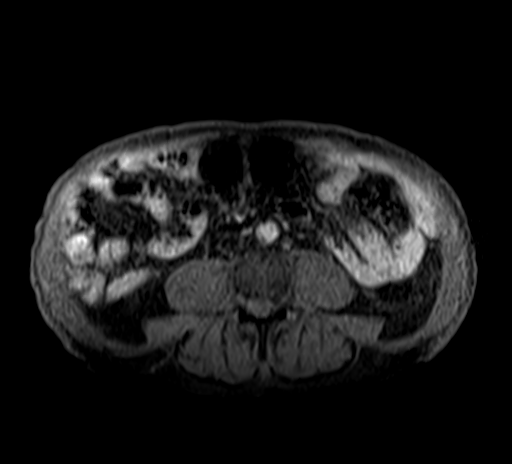
[im 40/80]
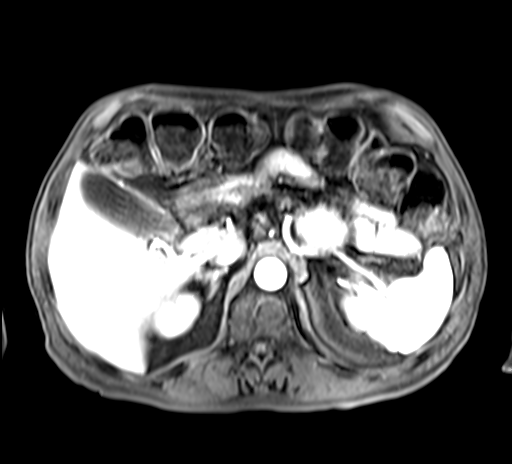
[im 80/80]
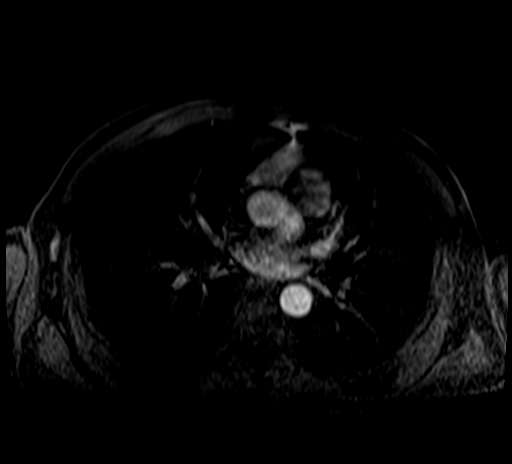

[Series 17: T1 dynamic post-contrast · axial · 3.0mm · 0.70mm/px · z∈[-141,+96]mm · 3 of 80 slices shown (2 of 4)]
[im 1/80]
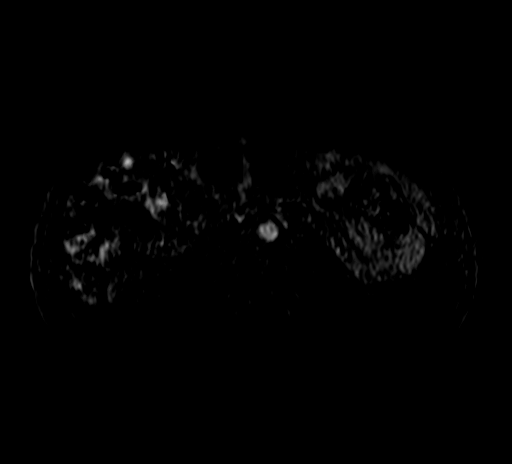
[im 40/80]
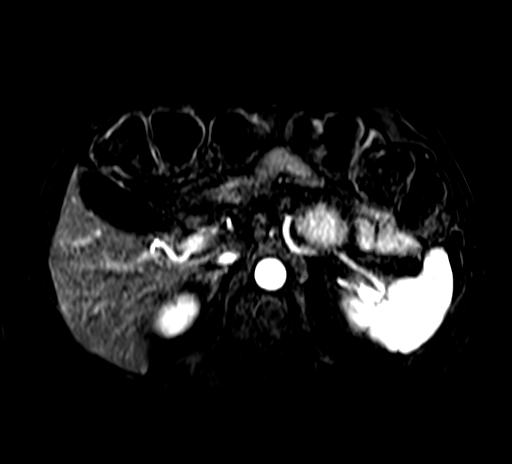
[im 80/80]
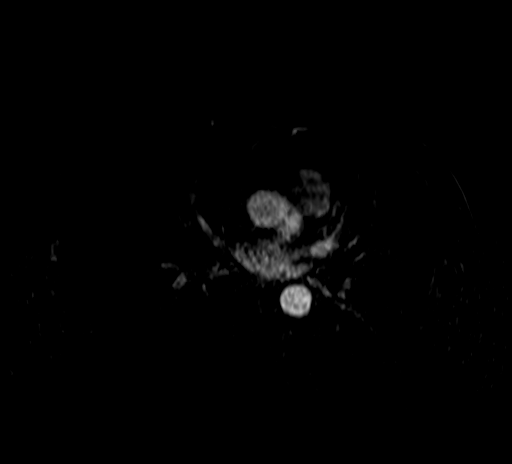

[Series 18: T1 dynamic post-contrast · axial · 3.0mm · 0.70mm/px · z∈[-141,+96]mm · 3 of 80 slices shown (3 of 4)]
[im 1/80]
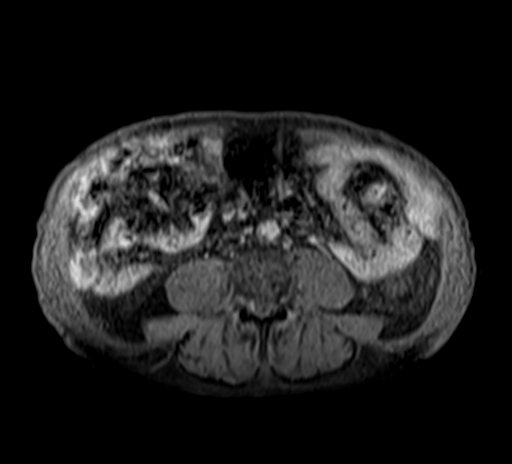
[im 40/80]
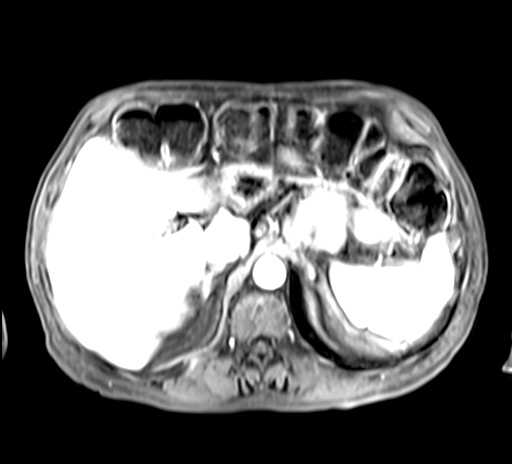
[im 80/80]
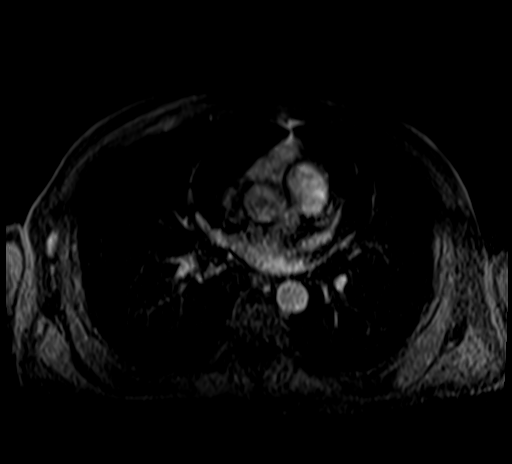

[Series 19: T1 dynamic post-contrast · axial · 3.0mm · 0.70mm/px · z∈[-141,-24]mm · 2 of 80 slices shown (4 of 4)]
[im 1/80]
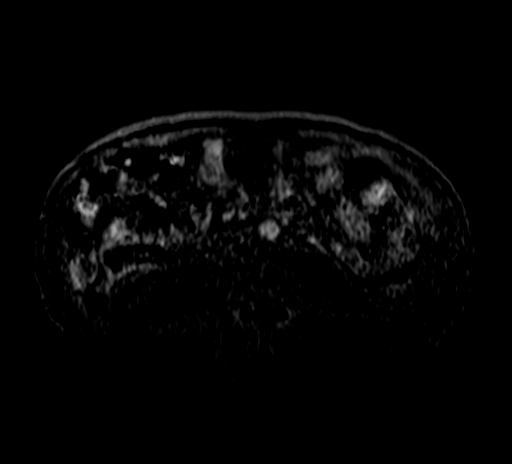
[im 40/80]
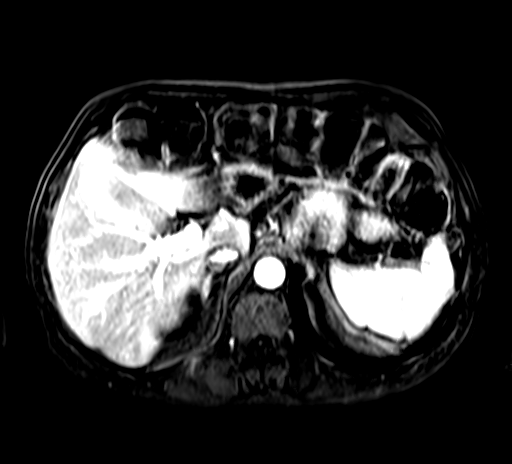

[24 of 48 positions shown; findings below may reference images not displayed]

FINDINGS: Lower chest: Unremarkable

Hepatobiliary: Tiny cyst posteriorly in the right hepatic lobe on
image 52 series 18, otherwise unremarkable. The gallbladder appears
normal. No biliary dilatation.

Pancreas: Abnormal substantial dilatation of dorsal pancreatic duct
at about 9 mm in diameter. Mildly dilated side ducts noted.
Pancreatic parenchymal atrophy.

The mixed density mass along the medial portion of the pancreatic
head which was previously anteriorly displacing the SMV has
essentially resolved. However, the dorsal pancreatic duct prominence
persists and there is low signal intensity along the pancreatic head
in the vicinity of the dorsal pancreatic duct likely correlating to
some of the previous pancreatic calcification.

Spleen:  Unremarkable

Adrenals/Urinary Tract: Bilateral infiltrative perirenal stranding,
some of which is likely chronic. Otherwise unremarkable.

Stomach/Bowel: Small type 1 hiatal hernia. Prominent stool
throughout the colon favors constipation.

Vascular/Lymphatic: Atherosclerosis is present, including aortoiliac
atherosclerotic disease. No pathologic adenopathy.

Other:  No supplemental non-categorized findings.

Musculoskeletal: Disc desiccation at L5-S1.
IMPRESSION: 1. The mass along the medial pancreatic head margin shown on the CT
of [DATE] has essentially resolved in the antrum. Presumably
this was a transient inflammatory phenomenon such as a pseudocyst
given the interval resolution.
2. However, there is still substantial dorsal pancreatic duct
dilatation up to 9 mm, with heterogeneity in the pancreatic head
probably due to a combination of the dense and heavy calcifications
in this vicinity. The dorsal pancreatic duct dilatation may be a
consequence of chronic pancreatitis and stricturing of the
pancreatic duct in the pancreatic head, but given the very unusual
appearance on [DATE], surveillance imaging of the pancreas is
suggested.
3.  Prominent stool throughout the colon favors constipation.
4. Small type 1 hiatal hernia.
5.  Aortic Atherosclerosis ([JG]-[JG]).

## 2022-03-29 MED ORDER — GADOBENATE DIMEGLUMINE 529 MG/ML IV SOLN
12.0000 mL | Freq: Once | INTRAVENOUS | Status: AC | PRN
  Administered 2022-03-29: 12 mL via INTRAVENOUS

## 2022-03-31 ENCOUNTER — Other Ambulatory Visit: Payer: Self-pay

## 2022-03-31 DIAGNOSIS — K860 Alcohol-induced chronic pancreatitis: Secondary | ICD-10-CM

## 2022-03-31 DIAGNOSIS — K862 Cyst of pancreas: Secondary | ICD-10-CM

## 2022-04-10 DIAGNOSIS — C7931 Secondary malignant neoplasm of brain: Secondary | ICD-10-CM | POA: Diagnosis not present

## 2022-04-10 DIAGNOSIS — I10 Essential (primary) hypertension: Secondary | ICD-10-CM | POA: Diagnosis not present

## 2022-04-10 DIAGNOSIS — N179 Acute kidney failure, unspecified: Secondary | ICD-10-CM | POA: Diagnosis not present

## 2022-04-10 DIAGNOSIS — E43 Unspecified severe protein-calorie malnutrition: Secondary | ICD-10-CM | POA: Diagnosis not present

## 2022-04-10 DIAGNOSIS — D630 Anemia in neoplastic disease: Secondary | ICD-10-CM | POA: Diagnosis not present

## 2022-04-10 DIAGNOSIS — K5732 Diverticulitis of large intestine without perforation or abscess without bleeding: Secondary | ICD-10-CM | POA: Diagnosis not present

## 2022-04-10 DIAGNOSIS — N390 Urinary tract infection, site not specified: Secondary | ICD-10-CM | POA: Diagnosis not present

## 2022-04-10 DIAGNOSIS — K861 Other chronic pancreatitis: Secondary | ICD-10-CM | POA: Diagnosis not present

## 2022-04-10 DIAGNOSIS — R627 Adult failure to thrive: Secondary | ICD-10-CM | POA: Diagnosis not present

## 2022-04-10 DIAGNOSIS — F1020 Alcohol dependence, uncomplicated: Secondary | ICD-10-CM | POA: Diagnosis not present

## 2022-04-10 DIAGNOSIS — C7800 Secondary malignant neoplasm of unspecified lung: Secondary | ICD-10-CM | POA: Diagnosis not present

## 2022-04-10 DIAGNOSIS — E785 Hyperlipidemia, unspecified: Secondary | ICD-10-CM | POA: Diagnosis not present

## 2022-04-10 DIAGNOSIS — R296 Repeated falls: Secondary | ICD-10-CM | POA: Diagnosis not present

## 2022-04-10 DIAGNOSIS — E1142 Type 2 diabetes mellitus with diabetic polyneuropathy: Secondary | ICD-10-CM | POA: Diagnosis not present

## 2022-04-10 DIAGNOSIS — B9620 Unspecified Escherichia coli [E. coli] as the cause of diseases classified elsewhere: Secondary | ICD-10-CM | POA: Diagnosis not present

## 2022-04-12 ENCOUNTER — Ambulatory Visit: Payer: BC Managed Care – PPO | Admitting: Family Medicine

## 2022-04-12 ENCOUNTER — Encounter: Payer: Self-pay | Admitting: Family Medicine

## 2022-04-12 VITALS — BP 116/64 | HR 69 | Temp 97.0°F | Ht 64.75 in | Wt 130.8 lb

## 2022-04-12 DIAGNOSIS — E43 Unspecified severe protein-calorie malnutrition: Secondary | ICD-10-CM

## 2022-04-12 DIAGNOSIS — Z85820 Personal history of malignant melanoma of skin: Secondary | ICD-10-CM | POA: Insufficient documentation

## 2022-04-12 DIAGNOSIS — E785 Hyperlipidemia, unspecified: Secondary | ICD-10-CM | POA: Diagnosis not present

## 2022-04-12 DIAGNOSIS — E119 Type 2 diabetes mellitus without complications: Secondary | ICD-10-CM

## 2022-04-12 DIAGNOSIS — I10 Essential (primary) hypertension: Secondary | ICD-10-CM

## 2022-04-12 DIAGNOSIS — Z8673 Personal history of transient ischemic attack (TIA), and cerebral infarction without residual deficits: Secondary | ICD-10-CM

## 2022-04-12 DIAGNOSIS — F1021 Alcohol dependence, in remission: Secondary | ICD-10-CM | POA: Insufficient documentation

## 2022-04-12 DIAGNOSIS — I700 Atherosclerosis of aorta: Secondary | ICD-10-CM | POA: Insufficient documentation

## 2022-04-12 DIAGNOSIS — G629 Polyneuropathy, unspecified: Secondary | ICD-10-CM

## 2022-04-12 DIAGNOSIS — B351 Tinea unguium: Secondary | ICD-10-CM

## 2022-04-12 LAB — COMPREHENSIVE METABOLIC PANEL
ALT: 12 U/L (ref 0–53)
AST: 15 U/L (ref 0–37)
Albumin: 4 g/dL (ref 3.5–5.2)
Alkaline Phosphatase: 42 U/L (ref 39–117)
BUN: 16 mg/dL (ref 6–23)
CO2: 30 mEq/L (ref 19–32)
Calcium: 9.6 mg/dL (ref 8.4–10.5)
Chloride: 101 mEq/L (ref 96–112)
Creatinine, Ser: 0.63 mg/dL (ref 0.40–1.50)
GFR: 102.97 mL/min (ref >60.00)
Glucose, Bld: 125 mg/dL — ABNORMAL HIGH (ref 70–99)
Potassium: 4.2 mEq/L (ref 3.5–5.1)
Sodium: 140 mEq/L (ref 135–145)
Total Bilirubin: 0.3 mg/dL (ref 0.2–1.2)
Total Protein: 6.5 g/dL (ref 6.0–8.3)

## 2022-04-12 LAB — LIPID PANEL
Cholesterol: 101 mg/dL (ref 0–200)
HDL: 46.8 mg/dL (ref >39.00)
LDL Cholesterol: 42 mg/dL (ref 0–99)
NonHDL: 53.91
Total CHOL/HDL Ratio: 2
Triglycerides: 59 mg/dL (ref 0.0–149.0)
VLDL: 11.8 mg/dL (ref 0.0–40.0)

## 2022-04-12 LAB — CBC
HCT: 35.4 % — ABNORMAL LOW (ref 39.0–52.0)
Hemoglobin: 12.2 g/dL — ABNORMAL LOW (ref 13.0–17.0)
MCHC: 34.4 g/dL (ref 30.0–36.0)
MCV: 89 fl (ref 78.0–100.0)
Platelets: 359 10*3/uL (ref 150.0–400.0)
RBC: 3.98 Mil/uL — ABNORMAL LOW (ref 4.22–5.81)
RDW: 12.7 % (ref 11.5–15.5)
WBC: 8.2 10*3/uL (ref 4.0–10.5)

## 2022-04-12 LAB — HEMOGLOBIN A1C: Hgb A1c MFr Bld: 5.3 % (ref 4.6–6.5)

## 2022-04-12 NOTE — Progress Notes (Signed)
?[facility] ?[facility] ?[place] ?[place] [address] ?Dept: [phone] ?Dept Fax: [phone] ? ?Transfer of Care Office Visit ? ?Subjective:  ? ? Patient ID: Kenneth [name], male    DOB: Apr 29, 1961, 61 y.o..   MRN: [id number] ? ?Chief Complaint  ?Patient presents with  ? Establish Care  ?  TOC- establish care.   No concerns.   ? ? ?History of Present Illness: ? ?Patient is in today to establish care. [name] was born in [place], [place]. His family moved when he was young to [place]. He Attended college at [employer] in math. 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 He is single and has no children. He current lives with his older sister Kenneth [name]). He denies any tobacco or drug use. ? ?[name] was admitted overnight at [facility] on 2/22 with difficulty walking, apparently due to an acute CVA. He was readmitted on 3/1-3/9 with failure to thrive that at least in part was due to chronic alcoholism. He notes he is maintaining his sobriety. He is eating better and is surprised to see his weight come up so much. He does take an Ensure supplement each day.  He would like clearance to return to work. He also asks about clearance to go back to driving. ?  ?[name] has a history of depression. He is managed on Remeron 7.5 mg daily for this. ?  ?[name] also has a history of  a GI bleed. He had a recent EGD and colonoscopy. He was found to have gastritis and duodenal erosions. He is currently managed on Protonix. ? ?[name] has a history of malignant melanoma. His primary tumor was found on his right thigh in 2013. A PET scan showed metastases to his lungs and his brain. He underwent wide local excision of the primary tumor, [facility] to the left frontotemporal lobe metastasis and was treated with ipilimumab. His cancer has apparently been in remission, but he notes he has  not seen the oncologist since the [place] pandemic started. ? ?[name] has a history of chronic pancreatitis that was related to his alcoholism. He had a pancreatic cyst noted on a scan earlier this year. He had a recent follow-up MRI scan that he states showed this resolved. ? ?Past Medical History: ?Patient Active Problem List  ? Diagnosis Date Noted  ? History of melanoma 04/12/2022  ? Alcohol dependence in remission ([place]) 04/12/2022  ? Alcohol-induced chronic pancreatitis ([place]) 03/15/2022  ? History of CVA (cerebrovascular accident) 02/28/2022  ? Protein-calorie malnutrition, severe 02/03/2022  ? Gastritis and gastroduodenitis   ? Pancreatic cyst - mass 02/02/2022  ? Normocytic anemia 02/02/2022  ? Occult GI bleeding 02/01/2022  ? DNR (do not resuscitate) 02/01/2022  ? Dizziness 01/24/2022  ? ARF (acute renal failure) ([place]) 01/24/2022  ? Elevated PSA 11/24/2021  ? Hypokalemia 11/24/2021  ? History of immunotherapy 11/25/2019  ? Hx of colonic polyps 03/28/2017  ? Melanoma metastatic to brain [facility]) 08/08/2016  ? 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[place]) 08/13/2014  ? Metastatic melanoma to lung ([place]) 12/12/2012  ? Diabetes mellitus without complication ([place]) 60/63/0160  ? Dyslipidemia 08/20/2007  ? Essential hypertension 08/19/2007  ? ?Past Surgical History:  ?Procedure Laterality Date  ? BIOPSY  02/03/2022  ? Procedure: BIOPSY;  Surgeon: Gatha Mayer, MD;  Location: [facility] ENDOSCOPY;  Service: Gastroenterology;;  ? BRAIN SURGERY  2015  ? to check for possible malignancy from melanoma, result  were scar tissue  ? COLONOSCOPY WITH PROPOFOL N/A 02/03/2022  ? Procedure: COLONOSCOPY WITH PROPOFOL;  Surgeon: Gatha Mayer, MD;  Location: [facility];  Service: Gastroenterology;  Laterality: N/A;  ? ESOPHAGOGASTRODUODENOSCOPY (EGD) WITH PROPOFOL N/A 02/03/2022  ? Procedure: ESOPHAGOGASTRODUODENOSCOPY (EGD) WITH PROPOFOL;  Surgeon: Gatha Mayer, MD;  Location: [facility];  Service: Gastroenterology;  Laterality: N/A;  ? melanoma  exicison    ? right leg  ? ?Family History  ?Problem Relation Age of Onset  ? Heart disease Father   ? Prostate cancer Father   ? Cancer Maternal Grandmother   ?     Brain  ? Stomach cancer Paternal Grandmother   ? Colon cancer Neg Hx   ? Esophageal cancer Neg Hx   ? Rectal cancer Neg Hx   ? ?Outpatient Medications Prior to Visit  ?Medication Sig Dispense Refill  ? amLODipine (NORVASC) 10 MG tablet TAKE 1 TABLET BY MOUTH EVERY DAY 90 tablet 3  ? aspirin EC 81 MG EC tablet Take 1 tablet (81 mg total) by mouth daily. Swallow whole. 30 tablet 11  ? atorvastatin (LIPITOR) 20 MG tablet TAKE 1 TABLET BY MOUTH DAILY 90 tablet 3  ? feeding supplement (ENSURE ENLIVE / ENSURE PLUS) LIQD Take 237 mLs by mouth 3 (three) times daily between meals. 237 mL 12  ? magnesium oxide (MAG-OX) 400 (240 Mg) MG tablet Take 1 tablet (400 mg total) by mouth 2 (two) times daily.    ? metFORMIN (GLUCOPHAGE) 1000 MG tablet TAKE 1 TABLET BY MOUTH TWICE DAILY 180 tablet 3  ? metoprolol tartrate (LOPRESSOR) 100 MG tablet TAKE 1 TABLET BY MOUTH TWICE DAILY 180 tablet 3  ? mirtazapine (REMERON) 7.5 MG tablet Take 1 tablet (7.5 mg total) by mouth at bedtime. 90 tablet 3  ? Multiple Vitamin (MULTIVITAMIN WITH MINERALS) TABS tablet Take 1 tablet by mouth daily.    ? pantoprazole (PROTONIX) 40 MG tablet Take 1 tablet (40 mg total) by mouth daily. 90 tablet 3  ? polyethylene glycol (MIRALAX / GLYCOLAX) 17 g packet Take 17 g by mouth daily. 14 each 0  ? senna-docusate (SENOKOT-S) 8.6-50 MG tablet Take 2 tablets by mouth at bedtime.    ? SODIUM FLUORIDE 5000 PPM 1.1 % PSTE Take by mouth.    ? tamsulosin (FLOMAX) 0.4 MG CAPS capsule Take 0.4 mg by mouth daily.    ? ?No facility-administered medications prior to visit.  ? ?Allergies  ?Allergen Reactions  ? Cat Hair Extract Shortness Of Breath and Swelling  ?  Swelling, watery eyes  ?  ?Objective:  ? ?Today's Vitals  ? 04/12/22 0957  ?BP: 116/64  ?Pulse: 69  ?Temp: (!) 97 ?F (36.1 ?C)  ?TempSrc: Temporal   ?SpO2: 99%  ?Weight: 130 lb 12.8 oz (59.3 kg)  ?Height: 5' 4.75" (1.645 m)  ? ?Body mass index is 21.93 kg/m?.  ? ?General: Well developed, well nourished. No acute distress. ?Feet- Skin intact. Moderate callus buildup on heels and lateral aspect of the foot. Maceration  ? between multiple toes. Both great toe nails are thickened with yellow discoloration R>>L.  ? Dorsalis pedis and posterior tibial artery pulses are normal. 5.07 monofilament testing shows  ? insensate areas for all toes and heels. ?Psych: Alert and oriented. Normal mood and affect. ? ?Health Maintenance Due  ?Topic Date Due  ? OPHTHALMOLOGY EXAM  Never done  ? FOOT EXAM  12/17/2018  ? URINE MICROALBUMIN  04/24/2019  ?   ?Imaging: ?MR Brain w contrast (  01/25/2022) ?IMPRESSION: ?Negative for metastatic disease to the brain. ? ?CT Chest, Abdomen, and Pelvis (02/01/2022) ?IMPRESSION: ?No acute cardiopulmonary disease. ?  ?Coronary artery disease, aortic atherosclerosis. ?  ?Small hiatal hernia. ?  ?Changes of chronic pancreatitis. ?  ?New cystic mass within the pancreatic head/uncinate process measuring up to 3.6 cm. Pancreatic ductal dilatation noted. Findings concerning for possible cystic pancreatic neoplasm. When the patient is clinically stable and able to follow directions and hold their ?breath (preferably as an outpatient) further evaluation with dedicated abdominal MRI should be considered. ? ?MR Abdomen w wo contrast (03/29/2022) ?IMPRESSION: ?1. The mass along the medial pancreatic head margin shown on the CT of 02/01/2022 has essentially resolved in the antrum. Presumably this was a transient inflammatory phenomenon such as a pseudocyst given the interval resolution. ?2. However, there is still substantial dorsal pancreatic duct dilatation up to 9 mm, with heterogeneity in the pancreatic head probably due to a combination of the dense and heavy calcifications in this vicinity. The dorsal pancreatic duct dilatation may be a consequence of  chronic pancreatitis and stricturing of the pancreatic duct in the pancreatic head, but given the very unusual appearance on 02/01/2022, surveillance imaging of the pancreas is suggested. ?3.  Prominent stool thr

## 2022-04-12 NOTE — Patient Instructions (Signed)
Schedule annual diabetic eye examination. ?Schedule follow-up with oncologist for melanoma surveillance. ?Consider routine pedicures for foot care. ?Contact DOT regarding requirements for return to driving after having a stroke. ?

## 2022-04-20 DIAGNOSIS — K5732 Diverticulitis of large intestine without perforation or abscess without bleeding: Secondary | ICD-10-CM | POA: Diagnosis not present

## 2022-04-20 DIAGNOSIS — R627 Adult failure to thrive: Secondary | ICD-10-CM | POA: Diagnosis not present

## 2022-04-20 DIAGNOSIS — I10 Essential (primary) hypertension: Secondary | ICD-10-CM | POA: Diagnosis not present

## 2022-04-20 DIAGNOSIS — K861 Other chronic pancreatitis: Secondary | ICD-10-CM | POA: Diagnosis not present

## 2022-04-20 DIAGNOSIS — E43 Unspecified severe protein-calorie malnutrition: Secondary | ICD-10-CM | POA: Diagnosis not present

## 2022-04-20 DIAGNOSIS — B9620 Unspecified Escherichia coli [E. coli] as the cause of diseases classified elsewhere: Secondary | ICD-10-CM | POA: Diagnosis not present

## 2022-04-20 DIAGNOSIS — C7931 Secondary malignant neoplasm of brain: Secondary | ICD-10-CM | POA: Diagnosis not present

## 2022-04-20 DIAGNOSIS — F1020 Alcohol dependence, uncomplicated: Secondary | ICD-10-CM | POA: Diagnosis not present

## 2022-04-20 DIAGNOSIS — E785 Hyperlipidemia, unspecified: Secondary | ICD-10-CM | POA: Diagnosis not present

## 2022-04-20 DIAGNOSIS — R296 Repeated falls: Secondary | ICD-10-CM | POA: Diagnosis not present

## 2022-04-20 DIAGNOSIS — D630 Anemia in neoplastic disease: Secondary | ICD-10-CM | POA: Diagnosis not present

## 2022-04-20 DIAGNOSIS — N179 Acute kidney failure, unspecified: Secondary | ICD-10-CM | POA: Diagnosis not present

## 2022-04-20 DIAGNOSIS — E1142 Type 2 diabetes mellitus with diabetic polyneuropathy: Secondary | ICD-10-CM | POA: Diagnosis not present

## 2022-04-20 DIAGNOSIS — C7800 Secondary malignant neoplasm of unspecified lung: Secondary | ICD-10-CM | POA: Diagnosis not present

## 2022-04-20 DIAGNOSIS — N390 Urinary tract infection, site not specified: Secondary | ICD-10-CM | POA: Diagnosis not present

## 2022-04-27 DIAGNOSIS — N3941 Urge incontinence: Secondary | ICD-10-CM | POA: Diagnosis not present

## 2022-04-27 DIAGNOSIS — R351 Nocturia: Secondary | ICD-10-CM | POA: Diagnosis not present

## 2022-04-28 DIAGNOSIS — F339 Major depressive disorder, recurrent, unspecified: Secondary | ICD-10-CM | POA: Diagnosis not present

## 2022-04-28 DIAGNOSIS — F1021 Alcohol dependence, in remission: Secondary | ICD-10-CM | POA: Diagnosis not present

## 2022-05-19 DIAGNOSIS — F1021 Alcohol dependence, in remission: Secondary | ICD-10-CM | POA: Diagnosis not present

## 2022-05-19 DIAGNOSIS — F339 Major depressive disorder, recurrent, unspecified: Secondary | ICD-10-CM | POA: Diagnosis not present

## 2022-05-31 ENCOUNTER — Encounter: Payer: Self-pay | Admitting: Internal Medicine

## 2022-05-31 ENCOUNTER — Ambulatory Visit (INDEPENDENT_AMBULATORY_CARE_PROVIDER_SITE_OTHER): Payer: BC Managed Care – PPO | Admitting: Internal Medicine

## 2022-05-31 VITALS — BP 132/70 | HR 70 | Ht 64.0 in | Wt 136.0 lb

## 2022-05-31 DIAGNOSIS — K8689 Other specified diseases of pancreas: Secondary | ICD-10-CM

## 2022-05-31 DIAGNOSIS — K862 Cyst of pancreas: Secondary | ICD-10-CM | POA: Diagnosis not present

## 2022-05-31 DIAGNOSIS — K860 Alcohol-induced chronic pancreatitis: Secondary | ICD-10-CM

## 2022-05-31 NOTE — Progress Notes (Signed)
Kenneth [name] 61 y.o. Dec 03, 1961 [id number]  Assessment & Plan:   Encounter Diagnoses  Name Primary?   Alcohol-induced chronic pancreatitis ([name]) Yes   Pancreatic cyst    Pancreatic mass    He is improved. Plan is to repeat an MRI around October to follow-up on changes in head of the pancreas.  I have a low suspicion of neoplasm but it is always possible in the situations and follow-up is appropriate.  Further plans pending that.  We had a discussion about stearrhea and what to look for he does not have problems like that at this time fortunately.  CC: Kenneth Salter, MD   Subjective:   Chief Complaint: Follow-up of chronic pancreatitis and a cyst/mass  HPI Kenneth [name] is a 61 year old man that I met in the hospital March 2023 due to weight loss problems.  He has a history of metastatic melanoma that is in remission/cured.    His GI work-up has included:  CT abdomen pelvis with contrast (also chest) 02/02/2022 Changes of chronic pancreatitis.   New cystic mass within the pancreatic head/uncinate process measuring up to 3.6 cm. Pancreatic ductal dilatation noted. Findings concerning for possible cystic pancreatic neoplasm. When the patient is clinically stable and able to follow directions and hold their breath (preferably as an outpatient) further evaluation with dedicated abdominal MRI should be considered.   MR abdomen with contrast 03/29/2022  IMPRESSION: 1. The mass along the medial pancreatic head margin shown on the CT of 02/01/2022 has essentially resolved in the antrum. Presumably this was a transient inflammatory phenomenon such as a pseudocyst given the interval resolution. 2. However, there is still substantial dorsal pancreatic duct dilatation up to 9 mm, with heterogeneity in the pancreatic head probably due to a combination of the dense and heavy calcifications in this vicinity. The dorsal pancreatic duct dilatation may be a consequence of chronic  pancreatitis and stricturing of the pancreatic duct in the pancreatic head, but given the very unusual appearance on 02/01/2022, surveillance imaging of the pancreas is suggested. 3.  Prominent stool throughout the colon favors constipation. 4. Small type 1 hiatal hernia. 5.  Aortic Atherosclerosis (ICD10-I70.0).  EGD 02/03/2022 esophageal erythema and endoscopic gastritis no H. pylori pending biopsies really negative for gastritis  Colonoscopy 02/03/2022 Internal and external hemorrhoids and diverticulosis   Wt Readings from Last 3 Encounters:  05/31/22 136 lb (61.7 kg)  04/12/22 130 lb 12.8 oz (59.3 kg)  03/15/22 130 lb (59 kg)  He reports that he is doing well.  He is trying to get back to work.  He is eating there are no signs of stearrhea or diarrhea.  He has established with primary care and I have reviewed that note.  He needs to complete some alcohol counseling prior to returning to work.   Allergies  Allergen Reactions   Cat Hair Extract Shortness Of Breath and Swelling    Swelling, watery eyes   Current Meds  Medication Sig   amLODipine (NORVASC) 10 MG tablet TAKE 1 TABLET BY MOUTH EVERY DAY   aspirin EC 81 MG EC tablet Take 1 tablet (81 mg total) by mouth daily. Swallow whole.   atorvastatin (LIPITOR) 20 MG tablet TAKE 1 TABLET BY MOUTH DAILY   feeding supplement (ENSURE ENLIVE / ENSURE PLUS) LIQD Take 237 mLs by mouth 3 (three) times daily between meals.   magnesium oxide (MAG-OX) 400 (240 Mg) MG tablet Take 1 tablet (400 mg total) by mouth 2 (two) times daily.   metFORMIN (  GLUCOPHAGE) 1000 MG tablet TAKE 1 TABLET BY MOUTH TWICE DAILY   metoprolol tartrate (LOPRESSOR) 100 MG tablet TAKE 1 TABLET BY MOUTH TWICE DAILY   mirtazapine (REMERON) 7.5 MG tablet Take 1 tablet (7.5 mg total) by mouth at bedtime.   Multiple Vitamin (MULTIVITAMIN WITH MINERALS) TABS tablet Take 1 tablet by mouth daily.   pantoprazole (PROTONIX) 40 MG tablet Take 1 tablet (40 mg total) by mouth daily.    polyethylene glycol (MIRALAX / GLYCOLAX) 17 g packet Take 17 g by mouth daily.   SODIUM FLUORIDE 5000 PPM 1.1 % PSTE Take by mouth.   tamsulosin (FLOMAX) 0.4 MG CAPS capsule Take 0.4 mg by mouth daily.   Past Medical History:  Diagnosis Date   Alcohol dependence ([place])    Chronic calcific pancreatitis ([facility])    CVA (cerebral vascular accident) ([place]) 01/24/2022   Diabetes mellitus without complication ([place])    Hyperlipidemia    Hypertension    Melanoma ([place]) 2013   metastatic - resected and cured with chemo/immuno tx   Pancreatic cyst-mass?    Polyneuropathy    Past Surgical History:  Procedure Laterality Date   BIOPSY  02/03/2022   Procedure: BIOPSY;  Surgeon: Gatha Mayer, MD;  Location: [facility] ENDOSCOPY;  Service: Gastroenterology;;   BRAIN SURGERY  2015   to check for possible malignancy from melanoma, result were scar tissue   COLONOSCOPY WITH PROPOFOL N/A 02/03/2022   Procedure: COLONOSCOPY WITH PROPOFOL;  Surgeon: Gatha Mayer, MD;  Location[facility]COPY;  Service: Gastroenterology;  Laterality: N/A;   ESOPHAGOGASTRODUODENOSCOPY (EGD) WITH PROPOFOL N/A 02/03/2022   Procedure: ESOPHAGOGASTRODUODENOSCOPY (EGD) WITH PROPOFOL;  Surgeon: Gatha Mayer, MD;  Location: [facility];  Service: Gastroenterology;  Laterality: N/A;   melanoma exicison     right leg   Social History   Social History Narrative   Single   Has supportive sisters   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   Review of Systems As per HPI  Objective:   Physical Exam BP 132/70   Pulse 70   Ht _0  (1.626 m)   Wt 136 lb (61.7 kg)   BMI 23.34 kg/m  Mildly chronically ill-appearing white man  in no acute distress Gait is slow and shuffling uses a cane but able to get onto the exam table by himself Abdomen is soft nontender no mass

## 2022-05-31 NOTE — Patient Instructions (Signed)
You will need a repeat MRI to recheck the pancreas in October of this year (2023). We have ordered it but scheduling will need to take place. If you have not heard from [place] or radiology by October 15 please contact us.  Further plans pending that.  I appreciate the opportunity to care for you. Gatha Mayer, MD, [facility]

## 2022-06-09 ENCOUNTER — Ambulatory Visit (INDEPENDENT_AMBULATORY_CARE_PROVIDER_SITE_OTHER): Payer: BC Managed Care – PPO | Admitting: Podiatry

## 2022-06-09 ENCOUNTER — Encounter: Payer: Self-pay | Admitting: Podiatry

## 2022-06-09 DIAGNOSIS — E1142 Type 2 diabetes mellitus with diabetic polyneuropathy: Secondary | ICD-10-CM | POA: Diagnosis not present

## 2022-06-09 DIAGNOSIS — E1169 Type 2 diabetes mellitus with other specified complication: Secondary | ICD-10-CM

## 2022-06-09 DIAGNOSIS — B351 Tinea unguium: Secondary | ICD-10-CM | POA: Diagnosis not present

## 2022-06-09 DIAGNOSIS — E119 Type 2 diabetes mellitus without complications: Secondary | ICD-10-CM

## 2022-06-09 NOTE — Progress Notes (Signed)
This patient returns to my office for at risk foot care.  This patient requires this care by a professional since this patient will be at risk due to having diabetes.  This patient is unable to cut nails himself since the patient cannot reach his nails.These nails are painful walking and wearing shoes.  This patient presents for at risk foot care today.  General Appearance  Alert, conversant and in no acute stress.  Vascular  Dorsalis pedis and posterior tibial  pulses are palpable  bilaterally.  Capillary return is within normal limits  bilaterally. Temperature is within normal limits  bilaterally.  Neurologic  Senn-Weinstein monofilament wire test diminished  bilaterally. Muscle power within normal limits bilaterally.  Nails Thick disfigured discolored nails with subungual debris  from hallux to fifth toes bilaterally. No evidence of bacterial infection or drainage bilaterally.  Orthopedic  No limitations of motion  feet .  No crepitus or effusions noted.  No bony pathology or digital deformities noted.  Skin  normotropic skin with no porokeratosis noted bilaterally.  No signs of infections or ulcers noted.     Onychomycosis  Pain in right toes  Pain in left toes  Consent was obtained for treatment procedures.   Mechanical debridement of nails 1-5  bilaterally performed with a nail nipper.  Filed with dremel without incident.    Return office visit   3 months                   Told patient to return for periodic foot care and evaluation due to potential at risk complications.   Adda Stokes DPM  

## 2022-06-21 DIAGNOSIS — F1020 Alcohol dependence, uncomplicated: Secondary | ICD-10-CM | POA: Diagnosis not present

## 2022-07-12 DIAGNOSIS — F1020 Alcohol dependence, uncomplicated: Secondary | ICD-10-CM | POA: Diagnosis not present

## 2022-09-06 ENCOUNTER — Other Ambulatory Visit: Payer: BC Managed Care – PPO

## 2022-09-20 ENCOUNTER — Ambulatory Visit: Payer: Self-pay | Admitting: Podiatry

## 2022-11-03 ENCOUNTER — Other Ambulatory Visit: Payer: Self-pay

## 2022-11-03 ENCOUNTER — Encounter (HOSPITAL_COMMUNITY): Payer: Self-pay

## 2022-11-03 ENCOUNTER — Emergency Department (HOSPITAL_COMMUNITY): Payer: Medicaid Other

## 2022-11-03 ENCOUNTER — Emergency Department (HOSPITAL_COMMUNITY)
Admission: EM | Admit: 2022-11-03 | Discharge: 2022-11-04 | Disposition: A | Discharge status: Home / Self Care | Payer: Medicaid Other | Attending: Emergency Medicine | Admitting: Emergency Medicine

## 2022-11-03 DIAGNOSIS — E119 Type 2 diabetes mellitus without complications: Secondary | ICD-10-CM | POA: Diagnosis not present

## 2022-11-03 DIAGNOSIS — Z7984 Long term (current) use of oral hypoglycemic drugs: Secondary | ICD-10-CM | POA: Insufficient documentation

## 2022-11-03 DIAGNOSIS — I10 Essential (primary) hypertension: Secondary | ICD-10-CM | POA: Diagnosis not present

## 2022-11-03 DIAGNOSIS — Z79899 Other long term (current) drug therapy: Secondary | ICD-10-CM | POA: Insufficient documentation

## 2022-11-03 DIAGNOSIS — R42 Dizziness and giddiness: Secondary | ICD-10-CM

## 2022-11-03 DIAGNOSIS — Z7982 Long term (current) use of aspirin: Secondary | ICD-10-CM | POA: Diagnosis not present

## 2022-11-03 LAB — URINALYSIS, ROUTINE W REFLEX MICROSCOPIC
Bacteria, UA: NONE SEEN
Bilirubin Urine: NEGATIVE
Glucose, UA: 50 mg/dL — AB
Hgb urine dipstick: NEGATIVE
Ketones, ur: NEGATIVE mg/dL
Leukocytes,Ua: NEGATIVE
Nitrite: NEGATIVE
Protein, ur: 100 mg/dL — AB
Specific Gravity, Urine: 1.017 (ref 1.005–1.030)
pH: 5 (ref 5.0–8.0)

## 2022-11-03 LAB — CBC WITH DIFFERENTIAL/PLATELET
Abs Immature Granulocytes: 0.02 10*3/uL (ref 0.00–0.07)
Basophils Absolute: 0.1 10*3/uL (ref 0.0–0.1)
Basophils Relative: 1 %
Eosinophils Absolute: 0.5 10*3/uL (ref 0.0–0.5)
Eosinophils Relative: 5 %
HCT: 41.7 % (ref 39.0–52.0)
Hemoglobin: 14.1 g/dL (ref 13.0–17.0)
Immature Granulocytes: 0 %
Lymphocytes Relative: 26 %
Lymphs Abs: 2.5 10*3/uL (ref 0.7–4.0)
MCH: 29.2 pg (ref 26.0–34.0)
MCHC: 33.8 g/dL (ref 30.0–36.0)
MCV: 86.3 fL (ref 80.0–100.0)
Monocytes Absolute: 0.8 10*3/uL (ref 0.1–1.0)
Monocytes Relative: 8 %
Neutro Abs: 5.8 10*3/uL (ref 1.7–7.7)
Neutrophils Relative %: 60 %
Platelets: 317 10*3/uL (ref 150–400)
RBC: 4.83 MIL/uL (ref 4.22–5.81)
RDW: 12.8 % (ref 11.5–15.5)
WBC: 9.7 10*3/uL (ref 4.0–10.5)
nRBC: 0 % (ref 0.0–0.2)

## 2022-11-03 LAB — COMPREHENSIVE METABOLIC PANEL
ALT: 26 U/L (ref 0–44)
AST: 25 U/L (ref 15–41)
Albumin: 3.7 g/dL (ref 3.5–5.0)
Alkaline Phosphatase: 60 U/L (ref 38–126)
Anion gap: 16 — ABNORMAL HIGH (ref 5–15)
BUN: 12 mg/dL (ref 8–23)
CO2: 24 mmol/L (ref 22–32)
Calcium: 9.7 mg/dL (ref 8.9–10.3)
Chloride: 101 mmol/L (ref 98–111)
Creatinine, Ser: 0.94 mg/dL (ref 0.61–1.24)
GFR, Estimated: >60 mL/min (ref >60)
Glucose, Bld: 147 mg/dL — ABNORMAL HIGH (ref 70–99)
Potassium: 3.3 mmol/L — ABNORMAL LOW (ref 3.5–5.1)
Sodium: 141 mmol/L (ref 135–145)
Total Bilirubin: 0.6 mg/dL (ref 0.3–1.2)
Total Protein: 6.3 g/dL — ABNORMAL LOW (ref 6.5–8.1)

## 2022-11-03 MED ORDER — MECLIZINE HCL 25 MG PO TABS
25.0000 mg | ORAL_TABLET | Freq: Once | ORAL | Status: AC
  Administered 2022-11-03: 25 mg via ORAL
  Filled 2022-11-03: qty 1

## 2022-11-03 NOTE — ED Provider Triage Note (Signed)
Emergency Medicine Provider Triage Evaluation Note  Kenneth [name] , a 61 y.o. male  was evaluated in triage.  Pt complains of dizziness and hypertension.  Dizziness started yesterday afternoon.  It was all of a sudden, intermittent and last 1 to 2 minutes when it happens.  Endorses room spinning sensation.  Improved with sitting down.  Also mentioned that his sister checked his blood pressure this morning.  The top number was 199.  States he had a recent stroke in February of this year.  Takes a daily aspirin denies chest pain shortness of breath and headache.  Review of Systems  Positive: See above Negative: See above  Physical Exam  SpO2 97%  Gen:   Awake, no distress   Resp:  Normal effort  MSK:   Moves extremities without difficulty  Other:  No FND  Medical Decision Making  Medically screening exam initiated at 2:52 PM.  Appropriate orders placed.  [name] was informed that the remainder of the evaluation will be completed by another provider, this initial triage assessment does not replace that evaluation, and the importance of remaining in the ED until their evaluation is complete.  Work up started   Donye, [place], PA-C 11/03/22 1500

## 2022-11-03 NOTE — ED Notes (Signed)
Patient walked to restroom with cane and light assistance by staff. Will continue to monitor

## 2022-11-03 NOTE — ED Provider Notes (Signed)
Signout received on this 61 year old male with past medical history significant for metastatic melanoma.  Presents with nonspecific dizziness.  At shift change she is awaiting MRI.  If this is normal can be discharged with meclizine. Physical Exam  BP (!) 183/83   Pulse (!) 56   Temp 97.9 F (36.6 C)   Resp 17   SpO2 98%   Physical Exam  Procedures  Procedures  ED Course / MDM    Medical Decision Making Amount and/or Complexity of Data Reviewed Radiology: ordered.   ***

## 2022-11-03 NOTE — ED Provider Notes (Signed)
[facility] EMERGENCY DEPARTMENT Provider Note   CSN: [id number] Arrival date & time: 11/03/22  1417     History  Chief Complaint  Patient presents with   Dizziness   Hypertension    Kenneth [name] is a 61 y.o. male history significant for EtOH abuse in remission, metastatic melanoma in remission, diabetes, hypertension, hyperlipidemia, CVA here for evaluation of dizziness.  Began yesterday.  Difficult time describing this.  Does state he feels like the room is spinning however states can also occur when he is just sitting still.  He denies any numbness or weakness.  No difficulty with word finding.  No associated headache.  No sudden onset symptoms.  No recent head trauma or injury.  Has also noted that his blood pressures have been higher than normal over the last few days.  Compliant with meds.  He denies any vision changes, neck pain, fever, chest pain, shortness of breath, abdominal pain, diarrhea or dysuria.  No history of similar symptoms.  He has chronic numbness to his lower extremities which is unchanged from his baseline per patient and family in room.  HPI     Home Medications Prior to Admission medications   Medication Sig Start Date End Date Taking? Authorizing Provider  amLODipine (NORVASC) 10 MG tablet TAKE 1 TABLET BY MOUTH EVERY DAY 03/13/22   Haydee Salter, MD  aspirin EC 81 MG EC tablet Take 1 tablet (81 mg total) by mouth daily. Swallow whole. 01/27/22   Phillips Grout, MD  atorvastatin (LIPITOR) 20 MG tablet TAKE 1 TABLET BY MOUTH DAILY 03/13/22   Haydee Salter, MD  feeding supplement (ENSURE ENLIVE / ENSURE PLUS) LIQD Take 237 mLs by mouth 3 (three) times daily between meals. 02/06/22   Bonnielee Haff, MD  magnesium oxide (MAG-OX) 400 (240 Mg) MG tablet Take 1 tablet (400 mg total) by mouth 2 (two) times daily. 02/06/22   Bonnielee Haff, MD  metFORMIN (GLUCOPHAGE) 1000 MG tablet TAKE 1 TABLET BY MOUTH TWICE DAILY 03/13/22   Haydee Salter, MD   metoprolol tartrate (LOPRESSOR) 100 MG tablet TAKE 1 TABLET BY MOUTH TWICE DAILY 03/13/22   Haydee Salter, MD  mirtazapine (REMERON) 7.5 MG tablet Take 1 tablet (7.5 mg total) by mouth at bedtime. 02/28/22   Haydee Salter, MD  Multiple Vitamin (MULTIVITAMIN WITH MINERALS) TABS tablet Take 1 tablet by mouth daily. 01/27/22   Phillips Grout, MD  pantoprazole (PROTONIX) 40 MG tablet Take 1 tablet (40 mg total) by mouth daily. 02/28/22   Haydee Salter, MD  polyethylene glycol (MIRALAX / GLYCOLAX) 17 g packet Take 17 g by mouth daily. 02/07/22   Bonnielee Haff, MD  SODIUM FLUORIDE 5000 PPM 1.1 % PSTE Take by mouth. 03/30/22   [provider]  tamsulosin (FLOMAX) 0.4 MG CAPS capsule Take 0.4 mg by mouth daily. 04/09/22   [provider]      Allergies    Cat hair extract    Review of Systems   Review of Systems  Constitutional: Negative.   HENT: Negative.    Respiratory: Negative.    Cardiovascular: Negative.   Gastrointestinal: Negative.   Genitourinary: Negative.   Musculoskeletal: Negative.   Skin: Negative.   Neurological:  Positive for dizziness. Negative for tremors, seizures, syncope, facial asymmetry, speech difficulty, weakness, light-headedness, numbness and headaches.  All other systems reviewed and are negative.   Physical Exam Updated Vital Signs BP (!) 183/83   Pulse (!) 56  Temp 97.9 F (36.6 C)   Resp 17   SpO2 98%  Physical Exam Physical Exam  Constitutional: Pt is oriented to person, place, and time. Pt appears well-developed and well-nourished. No distress.  HENT:  Head: Normocephalic and atraumatic.  Mouth/Throat: Oropharynx is clear and moist.  Eyes: Conjunctivae and EOM are normal. Pupils are equal, round, and reactive to light. No scleral icterus.  No horizontal, vertical or rotational nystagmus  Neck: Normal range of motion. Neck supple.  Full active and passive ROM without pain No midline or paraspinal tenderness No nuchal rigidity  or meningeal signs  Cardiovascular: Normal rate, regular rhythm and intact distal pulses.   Pulmonary/Chest: Effort normal and breath sounds normal. No respiratory distress. Pt has no wheezes. No rales.  Abdominal: Soft. Bowel sounds are normal. There is no tenderness. There is no rebound and no guarding.  Musculoskeletal: Normal range of motion.  Lymphadenopathy:    No cervical adenopathy.  Neurological: Pt. is alert and oriented to person, place, and time. He has normal reflexes. No cranial nerve deficit.  Exhibits normal muscle tone. Coordination normal.  Mental Status:  Alert, oriented, thought content appropriate. Speech fluent without evidence of aphasia. Able to follow 2 step commands without difficulty.  Cranial Nerves:  2-12 grossly intact Motor:  Equal strength BIL Sensory: intact BIL Cerebellar: normal finger-to-nose with bilateral upper extremities Gait: normal gait and balance CV: distal pulses palpable throughout   Skin: Skin is warm and dry. No rash noted. Pt is not diaphoretic.  Psychiatric: Pt has a normal mood and affect. Behavior is normal. Judgment and thought content normal.  Nursing note and vitals reviewed.  ED Results / Procedures / Treatments   Labs (all labs ordered are listed, but only abnormal results are displayed) Labs Reviewed  COMPREHENSIVE METABOLIC PANEL - Abnormal; Notable for the following components:      Result Value   Potassium 3.3 (*)    Glucose, Bld 147 (*)    Total Protein 6.3 (*)    Anion gap 16 (*)    All other components within normal limits  URINALYSIS, ROUTINE W REFLEX MICROSCOPIC - Abnormal; Notable for the following components:   APPearance HAZY (*)    Glucose, UA 50 (*)    Protein, ur 100 (*)    All other components within normal limits  CBC WITH DIFFERENTIAL/PLATELET    EKG None  Radiology CT Head Wo Contrast  Result Date: 11/03/2022 CLINICAL DATA:  Vertigo, peripheral. Right thalamic infarct in February of 2023 EXAM: CT  HEAD WITHOUT CONTRAST TECHNIQUE: Contiguous axial images were obtained from the base of the skull through the vertex without intravenous contrast. RADIATION DOSE REDUCTION: This exam was performed according to the departmental dose-optimization program which includes automated exposure control, adjustment of the mA and/or kV according to patient size and/or use of iterative reconstruction technique. COMPARISON:  01/24/2022 FINDINGS: Brain: No evidence of acute infarction, hemorrhage, hydrocephalus, extra-axial collection or mass lesion/mass effect. Multiple chronic bilateral basal ganglia and thalamic lacunar infarcts. Left frontal lobe encephalomalacia. Extensive low-density changes within the periventricular and subcortical white matter compatible with chronic microvascular ischemic change. Mild diffuse cerebral volume loss. Vascular: No hyperdense vessel or unexpected calcification. Skull: Prior left temporal craniotomy. No calvarial fracture or suspicious lesion. Sinuses/Orbits: No acute finding. Other: None. IMPRESSION: 1. No acute intracranial abnormality. 2. Chronic microvascular ischemic change and cerebral volume loss. Electronically Signed   By: Davina Poke D.O.   On: 11/03/2022 15:54    Procedures Procedures  Medications Ordered in ED Medications  meclizine (ANTIVERT) tablet 25 mg (25 mg Oral Given 11/03/22 2112)    ED Course/ Medical Decision Making/ A&P    61 year old history of metastatic melanoma, hypertension, CKD here for evaluation of dizziness.  Began yesterday.  Associated elevated blood pressure at home compliant with his home antihypertensive.  No chest pain or shortness of breath.  He has difficult time describing his dizziness some point described as a room spinning however states he could be sitting there and he will feel dizzy as well.  No sudden onset headache.  He has a nonfocal neuroexam without deficits.  He has never had anything this previously.  No recent trauma.  Had prior left frontotemporal lobe mets  treated with  cyberknife radiosurgery. He appears clinically well-hydrated.  No chest pain, shortness of breath, lower extremity swelling to suggest ACS, PE or dissection as cause of his dizziness. Labs, imaging, treating his symptoms and reassess  Labs and imaging personally viewed and interpreted:  CBC without leukocytosis, globin 82.9 Metabolic panel potassium 3.3, glucose 147 UA negative for infection CT head without acute changes EKG without ischemic changes  Patient reassessed.  Did need mild assistance with ambulating. Will proceed with MR brain and meclizine.  Patient reassessed.  Symptoms mildly improved with meclizine.  He still pending MRI.  Care transferred to [facility] [place] will follow-up on MRI.  If no significant abnormality suspect patient will likely be able to DC home with meclizine                           Medical Decision Making Amount and/or Complexity of Data Reviewed Independent Historian: caregiver    Details: sister External Data Reviewed: labs, radiology, ECG and notes. Labs: ordered. Decision-making details documented in ED Course. Radiology: ordered and independent interpretation performed. Decision-making details documented in ED Course. ECG/medicine tests: ordered and independent interpretation performed. Decision-making details documented in ED Course.  Risk OTC drugs. Prescription drug management. Parenteral controlled substances. Decision regarding hospitalization. Diagnosis or treatment significantly limited by social determinants of health.           Final Clinical Impression(s) / ED Diagnoses Final diagnoses:  Dizziness  Hypertension, unspecified type    Rx / DC Orders ED Discharge Orders     None         Mushka Laconte A, PA-C 11/03/22 2357    Fransico Meadow, MD 11/05/22 1334

## 2022-11-03 NOTE — ED Triage Notes (Signed)
Pt BIB [facility] from home c/o hypertension and dizziness x2 days. Pt has a hx of HTN and is complaint with meds. Pt denies any CP or headache.

## 2022-11-04 ENCOUNTER — Emergency Department (HOSPITAL_COMMUNITY): Payer: Medicaid Other

## 2022-11-04 MED ORDER — MECLIZINE HCL 25 MG PO TABS: 25.0000 mg | ORAL_TABLET | Freq: Three times a day (TID) | ORAL | 0 refills | Status: DC | PRN

## 2022-11-04 NOTE — Discharge Instructions (Addendum)
Your work today was overall reassuring.  CT scan of the head, MRI of the brain did not show any concerning cause of your dizziness particularly there was no stroke.  You ambulated within the emergency room without difficulty.  I have sent meclizine into the pharmacy for you.  Take this as needed for dizziness.  Otherwise please call your neurologist (I have attached your neurologist information above) Monday to schedule a hospital follow-up appointment.  He had elevated blood pressure today.  Please keep a blood pressure diary and follow-up with your primary care provider.

## 2022-11-07 ENCOUNTER — Telehealth: Payer: Self-pay

## 2022-11-07 ENCOUNTER — Telehealth: Payer: Self-pay | Admitting: Family Medicine

## 2022-11-07 ENCOUNTER — Ambulatory Visit (INDEPENDENT_AMBULATORY_CARE_PROVIDER_SITE_OTHER): Payer: Medicaid Other | Admitting: Family Medicine

## 2022-11-07 ENCOUNTER — Encounter: Payer: Self-pay | Admitting: Family Medicine

## 2022-11-07 VITALS — BP 138/80 | HR 54 | Temp 97.1°F | Ht 64.0 in | Wt 144.0 lb

## 2022-11-07 DIAGNOSIS — L219 Seborrheic dermatitis, unspecified: Secondary | ICD-10-CM

## 2022-11-07 DIAGNOSIS — R42 Dizziness and giddiness: Secondary | ICD-10-CM

## 2022-11-07 DIAGNOSIS — F1021 Alcohol dependence, in remission: Secondary | ICD-10-CM

## 2022-11-07 DIAGNOSIS — R972 Elevated prostate specific antigen [PSA]: Secondary | ICD-10-CM

## 2022-11-07 DIAGNOSIS — E785 Hyperlipidemia, unspecified: Secondary | ICD-10-CM

## 2022-11-07 DIAGNOSIS — I10 Essential (primary) hypertension: Secondary | ICD-10-CM

## 2022-11-07 DIAGNOSIS — E119 Type 2 diabetes mellitus without complications: Secondary | ICD-10-CM

## 2022-11-07 DIAGNOSIS — Z8673 Personal history of transient ischemic attack (TIA), and cerebral infarction without residual deficits: Secondary | ICD-10-CM

## 2022-11-07 LAB — URINALYSIS, ROUTINE W REFLEX MICROSCOPIC
Bilirubin Urine: NEGATIVE
Hgb urine dipstick: NEGATIVE
Ketones, ur: NEGATIVE
Leukocytes,Ua: NEGATIVE
Nitrite: NEGATIVE
Specific Gravity, Urine: 1.025 (ref 1.000–1.030)
Total Protein, Urine: 100 — AB
Urine Glucose: NEGATIVE
Urobilinogen, UA: 0.2 (ref 0.0–1.0)
pH: 6 (ref 5.0–8.0)

## 2022-11-07 LAB — LIPID PANEL
Cholesterol: 84 mg/dL (ref 0–200)
HDL: 33.5 mg/dL — ABNORMAL LOW (ref >39.00)
LDL Cholesterol: 33 mg/dL (ref 0–99)
NonHDL: 50.36
Total CHOL/HDL Ratio: 3
Triglycerides: 86 mg/dL (ref 0.0–149.0)
VLDL: 17.2 mg/dL (ref 0.0–40.0)

## 2022-11-07 LAB — PSA: PSA: 1.87 ng/mL (ref 0.10–4.00)

## 2022-11-07 LAB — BASIC METABOLIC PANEL
BUN: 9 mg/dL (ref 6–23)
CO2: 31 mEq/L (ref 19–32)
Calcium: 9.5 mg/dL (ref 8.4–10.5)
Chloride: 100 mEq/L (ref 96–112)
Creatinine, Ser: 0.81 mg/dL (ref 0.40–1.50)
GFR: 95.06 mL/min (ref >60.00)
Glucose, Bld: 159 mg/dL — ABNORMAL HIGH (ref 70–99)
Potassium: 3.9 mEq/L (ref 3.5–5.1)
Sodium: 140 mEq/L (ref 135–145)

## 2022-11-07 LAB — MICROALBUMIN / CREATININE URINE RATIO
Creatinine,U: 138.4 mg/dL
Microalb Creat Ratio: 28.1 mg/g (ref 0.0–30.0)
Microalb, Ur: 38.8 mg/dL — ABNORMAL HIGH (ref 0.0–1.9)

## 2022-11-07 LAB — HEMOGLOBIN A1C: Hgb A1c MFr Bld: 9.1 % — ABNORMAL HIGH (ref 4.6–6.5)

## 2022-11-07 MED ORDER — AMLODIPINE BESY-BENAZEPRIL HCL 10-20 MG PO CAPS: 1.0000 | ORAL_CAPSULE | Freq: Every day | ORAL | 3 refills | Status: DC

## 2022-11-07 MED ORDER — TRIAMCINOLONE ACETONIDE 0.1 % EX CREA: TOPICAL_CREAM | CUTANEOUS | 5 refills | Status: DC

## 2022-11-07 MED ORDER — FLUOCINOLONE ACETONIDE 0.01 % EX SHAM: MEDICATED_SHAMPOO | CUTANEOUS | 6 refills | Status: DC

## 2022-11-07 NOTE — Telephone Encounter (Signed)
Patient notified [place] phone.  No other questions. Dm/cma

## 2022-11-07 NOTE — Progress Notes (Signed)
[facility] [address] [place] [address] Dept: [phone] Dept Fax: [phone]  Chronic Care Office Visit  Subjective:    Patient ID: Kenneth [name], male    DOB: 10/22/1961, 61 y.o..   MRN: [id number]  Chief Complaint  Patient presents with   Hospitalization [facility] f/u 11/03/22.  Still having some dizziness when getting up.     History of Present Illness:  Patient is in today for reassessment of chronic medical issues.  Kenneth [name] was seen at [facility] on 12/1 with vertigo issues. He has had this episodically, but it was much worse on this particular day. His sister had been by the house and noted him to fall over due to the vertigo. he had lab evaluation and a CT scan of the head performed at the ED, which showed evidence of his prior stroke but no new issues. He was instructed to follow-up with me and neurology. Kenneth [name] has had difficulty getting the appointment with neurology.  Kenneth [name] Has a history of a CVA in Feb. 2023. He is no longer working. He notes some restricted grip in his right hand. He does work at home therapy for this.   Kenneth [name] has a history of alcoholism. he remains in sobriety since Feb.  Kenneth [name] has a history of an elevated PSA. He is due for monitoring of this. He is on Flomax 0.4 mg daily for LUTS.  Kenneth [name] has a history of Type 2 diabetes. He is managed on metformin 1,000 mg bid.  Kenneth [name] has a history of hypertension. He is managed on amlodipine 10 mg daily and metoprolol tartrate 100 mg bid.  Kenneth [name] has dyslipidemia. He is managed on atorvastatin 20 mg daily.  Kenneth [name] has a history of dandruff and facial rash. He notes he uses a dandruff shampoo and facial lotions, but nothing seems to help his rash.  Past Medical History: Patient Active Problem List   Diagnosis Date Noted   Seborrhea 11/07/2022   History of melanoma  04/12/2022   Alcohol dependence in remission ([place]) 04/12/2022   Aortic atherosclerosis ([place]) 04/12/2022   Alcohol-induced chronic pancreatitis ([place]) 03/15/2022   History of CVA (cerebrovascular accident) 02/28/2022   Protein-calorie malnutrition, severe 02/03/2022   Gastritis and gastroduodenitis    Normocytic anemia 02/02/2022   Occult GI bleeding 02/01/2022   DNR (do not resuscitate) 02/01/2022   Vertigo 01/24/2022   ARF (acute renal failure) ([place]) 01/24/2022   Elevated PSA 11/24/2021   Hypokalemia 11/24/2021   History of immunotherapy 11/25/2019   Hx of colonic polyps 03/28/2017   Melanoma metastatic to brain ([place]) 08/08/2016   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[place]) 08/13/2014   Metastatic melanoma to lung ([place]) 12/12/2012   Type 2 diabetes mellitus ([place]) 08/20/2007   Dyslipidemia 08/20/2007   Essential hypertension 08/19/2007   Past Surgical History:  Procedure Laterality Date   BIOPSY  02/03/2022   Procedure: BIOPSY;  Surgeon: Gatha Mayer, MD;  Location: [facility];  Service: Gastroenterology;;   BRAIN SURGERY  2015   to check for possible malignancy from melanoma, result were scar tissue   COLONOSCOPY WITH PROPOFOL N/A 02/03/2022   Procedure: COLONOSCOPY WITH PROPOFOL;  Surgeon: Gatha Mayer, MD;  Location: [facility];  Service: Gastroenterology;  Laterality: N/A;   ESOPHAGOGASTRODUODENOSCOPY (EGD) WITH PROPOFOL N/A 02/03/2022   Procedure: ESOPHAGOGASTRODUODENOSCOPY (EGD) WITH PROPOFOL;  Surgeon: Gatha Mayer, MD;  Location: [facility];  Service: Gastroenterology;  Laterality: N/A;  melanoma exicison     right leg   Family History  Problem Relation Age of Onset   Heart disease Father    Prostate cancer Father    Cancer Maternal Grandmother        Brain   Stomach cancer Paternal Grandmother    Colon cancer Neg Hx    Esophageal cancer Neg Hx    Rectal cancer Neg Hx    Outpatient Medications Prior to Visit  Medication Sig Dispense Refill   aspirin EC 81 MG EC tablet  Take 1 tablet (81 mg total) by mouth daily. Swallow whole. 30 tablet 11   atorvastatin (LIPITOR) 20 MG tablet TAKE 1 TABLET BY MOUTH DAILY 90 tablet 3   feeding supplement (ENSURE ENLIVE / ENSURE PLUS) LIQD Take 237 mLs by mouth 3 (three) times daily between meals. 237 mL 12   magnesium oxide (MAG-OX) 400 (240 Mg) MG tablet Take 1 tablet (400 mg total) by mouth 2 (two) times daily.     meclizine (ANTIVERT) 25 MG tablet Take 1 tablet (25 mg total) by mouth 3 (three) times daily as needed for dizziness. 30 tablet 0   metFORMIN (GLUCOPHAGE) 1000 MG tablet TAKE 1 TABLET BY MOUTH TWICE DAILY 180 tablet 3   metoprolol tartrate (LOPRESSOR) 100 MG tablet TAKE 1 TABLET BY MOUTH TWICE DAILY 180 tablet 3   mirtazapine (REMERON) 7.5 MG tablet Take 1 tablet (7.5 mg total) by mouth at bedtime. 90 tablet 3   Multiple Vitamin (MULTIVITAMIN WITH MINERALS) TABS tablet Take 1 tablet by mouth daily.     pantoprazole (PROTONIX) 40 MG tablet Take 1 tablet (40 mg total) by mouth daily. 90 tablet 3   polyethylene glycol (MIRALAX / GLYCOLAX) 17 g packet Take 17 g by mouth daily. 14 each 0   SODIUM FLUORIDE 5000 PPM 1.1 % PSTE Take by mouth.     tamsulosin (FLOMAX) 0.4 MG CAPS capsule Take 0.4 mg by mouth daily.     amLODipine (NORVASC) 10 MG tablet TAKE 1 TABLET BY MOUTH EVERY DAY 90 tablet 3   No facility-administered medications prior to visit.   Allergies  Allergen Reactions   Cat Hair Extract Shortness Of Breath and Swelling    Swelling, watery eyes    Objective:   Today's Vitals   11/07/22 1003  BP: 138/80  Pulse: (!) 54  Temp: (!) 97.1 F (36.2 C)  TempSrc: Temporal  SpO2: 99%  Weight: 144 lb (65.3 kg)  Height: '5\' 4"'$  (1.626 m)   Body mass index is 24.72 kg/m.   General: Well developed, well nourished. No acute distress. Skin: Warm and dry. There is extensive areas of scaliness involving the entire scalp, the face,  and in the ears. Neuro: Inability to fully clench the right hand into a  fist. Psych: Alert and oriented. Normal mood and affect.  Health Maintenance Due  Topic Date Due   OPHTHALMOLOGY EXAM  Never done   Zoster Vaccines- Shingrix (1 of 2) Never done   Diabetic kidney evaluation - Urine ACR  04/24/2019   HEMOGLOBIN A1C  10/13/2022   Lab Results Last CBC Lab Results  Component Value Date   WBC 9.7 11/03/2022   HGB 14.1 11/03/2022   HCT 41.7 11/03/2022   MCV 86.3 11/03/2022   MCH 29.2 11/03/2022   RDW 12.8 11/03/2022   PLT 317 80/32/1224   Last metabolic panel Lab Results  Component Value Date   GLUCOSE 147 (H) 11/03/2022   NA 141 11/03/2022   K 3.3 (  L) 11/03/2022   CL 101 11/03/2022   CO2 24 11/03/2022   BUN 12 11/03/2022   CREATININE 0.94 11/03/2022   GFRNONAA >60 11/03/2022   CALCIUM 9.7 11/03/2022   PHOS 2.8 02/04/2022   PROT 6.3 (L) 11/03/2022   ALBUMIN 3.7 11/03/2022   LABGLOB 2.2 01/25/2022   AGRATIO 1.3 01/25/2022   BILITOT 0.6 11/03/2022   ALKPHOS 60 11/03/2022   AST 25 11/03/2022   ALT 26 11/03/2022   ANIONGAP 16 (H) 11/03/2022   Imaging: CT of Head wo contrast (11/03/2022) IMPRESSION: 1. No acute intracranial abnormality. 2. Chronic microvascular ischemic change and cerebral volume loss.   MR of Brain wo contrast (11/04/2022) IMPRESSION: 1. No acute intracranial abnormality. 2. Multiple old small vessel infarcts of the deep gray nuclei and findings of chronic small vessel ischemia. 3. Chronic left pontine microhemorrhage.    Assessment & Plan:   1. Vertigo It is unclear if the vertigo is an inner ear issue or a late sequelae of the prior stroke. I will go ahead and place a referral back to neurology to try and facilitate him getting this follow-up.  - Ambulatory referral to Neurology  2. History of CVA (cerebrovascular accident) As above. Some loss of grip strength in the right hand. I urged him to continue his home therpay efforts.  - Ambulatory referral to Neurology  3. Type 2 diabetes mellitus without  complication, without long-term current use of insulin ([place]) Due for annual DM labs. Plan to continue metformin 1,000 mg bid. Recommend he schedule his annual eye exam.  - Lipid panel - Microalbumin / creatinine urine ratio - Basic metabolic panel - Hemoglobin A1c - Urinalysis, Routine w reflex microscopic  4. Essential hypertension Blood pressure is mildly high today. I will switch him from amlodipine to Lotrel to see if we can improve his BP control.  - amLODipine-benazepril (LOTREL) 10-20 MG capsule; Take 1 capsule by mouth daily.  Dispense: 90 capsule; Refill: 3  5. Elevated PSA Will repeat today.  - PSA  6. Alcohol dependence in remission [facility]) Remains sober. I strongly encourage his ongoing abstinence.  7. Dyslipidemia Due for repeat lipids today. Continue atorvastatin 20 mg daily.  8. Seborrhea I will have him try using a steroid shampoo and TAC cream on the face/ears/neck.  - Fluocinolone Acetonide 0.01 % SHAM; Shampoo hair with medicated shampoo every other day.  Dispense: 120 mL; Refill: 6 - triamcinolone cream (KENALOG) 0.1 %; Apply small amount topically to areas of scaly rash on face and neck once a day as needed.  Dispense: 30 g; Refill: 5  Return in about 3 months (around 02/06/2023) for Reassessment.    Haydee Salter, MD

## 2022-11-07 NOTE — Telephone Encounter (Signed)
Caller Name: Bridgette Habermann Call back phone #: [phone]  Reason for Call: PT was prescribed shampoo, insurance will not cover this. The cost is $400 which he can not afford. Is there an alternative? Over the counter?

## 2022-11-07 NOTE — Telephone Encounter (Signed)
Transition Care Management Follow-up Telephone Call Date of discharge and from where: 11/04/2022 [facility] ED. Dx: Vertigo How have you been since you were released from the hospital? I'm doing good Any questions or concerns? No  Items Reviewed: Did the pt receive and understand the discharge instructions provided? Yes  Medications obtained and verified? No Other? No  Any new allergies since your discharge? No  Dietary orders reviewed? No Do you have support at home? Yes   Home Care and Equipment/Supplies: Were home health services ordered? not applicable If so, what is the name of the agency? N/a  Has the agency set up a time to come to the patient's home? not applicable Were any new equipment or medical supplies ordered?  No What is the name of the medical supply agency? N/a Were you able to get the supplies/equipment? not applicable Do you have any questions related to the use of the equipment or supplies? No  Functional Questionnaire: (I = Independent and D = Dependent) ADLs: I  Bathing/Dressing- I  Meal Prep- I  Eating- I  Maintaining continence- I  Transferring/Ambulation- I  Managing Meds- I  Follow up appointments reviewed:  PCP Hospital f/u appt confirmed? Yes  Scheduled to see Dr. Gena Fray on 11/07/22 @ 10:00am. [facility] f/u appt confirmed? No  Scheduled to see n/a on n/a @ n/a. Are transportation arrangements needed? No  If their condition worsens, is the pt aware to call PCP or go to the Emergency Dept.? Yes Was the patient provided with contact information for the PCP's office or ED? Yes Was to pt encouraged to call back with questions or concerns? Yes

## 2022-11-08 MED ORDER — PIOGLITAZONE HCL 30 MG PO TABS: 30.0000 mg | ORAL_TABLET | Freq: Every day | ORAL | 5 refills | Status: DC

## 2022-11-08 NOTE — Addendum Note (Signed)
Addended by: Haydee Salter on: 11/08/2022 01:06 PM   Modules accepted: Orders

## 2022-11-09 ENCOUNTER — Other Ambulatory Visit (HOSPITAL_COMMUNITY): Payer: Self-pay

## 2022-11-09 MED ORDER — FLUOCINOLONE ACETONIDE SCALP 0.01 % EX OIL: TOPICAL_OIL | CUTANEOUS | 3 refills | Status: DC

## 2022-11-09 NOTE — Addendum Note (Signed)
Addended by: Haydee Salter on: 11/09/2022 02:55 PM   Modules accepted: Orders

## 2022-11-09 NOTE — Telephone Encounter (Signed)
Received prior authorization request from pharmacy, insurance prefers DermaSmoothe FS scalp oil. Ran a test claim and received a paid claim.

## 2022-11-09 NOTE — Telephone Encounter (Signed)
Patient notified VIA phone. Dm/cma  

## 2022-11-30 ENCOUNTER — Ambulatory Visit (INDEPENDENT_AMBULATORY_CARE_PROVIDER_SITE_OTHER): Payer: Medicaid Other | Admitting: Neurology

## 2022-11-30 ENCOUNTER — Encounter: Payer: Self-pay | Admitting: Neurology

## 2022-11-30 ENCOUNTER — Telehealth: Payer: Self-pay | Admitting: Neurology

## 2022-11-30 VITALS — BP 125/75 | HR 54 | Ht 64.0 in | Wt 144.0 lb

## 2022-11-30 DIAGNOSIS — R42 Dizziness and giddiness: Secondary | ICD-10-CM | POA: Diagnosis not present

## 2022-11-30 DIAGNOSIS — H8110 Benign paroxysmal vertigo, unspecified ear: Secondary | ICD-10-CM

## 2022-11-30 DIAGNOSIS — Z8673 Personal history of transient ischemic attack (TIA), and cerebral infarction without residual deficits: Secondary | ICD-10-CM | POA: Diagnosis not present

## 2022-11-30 NOTE — Telephone Encounter (Signed)
Referral for physical therapy sent through EPIC to [facility]. Phone: [phone] 

## 2022-11-30 NOTE — Patient Instructions (Signed)
I had a long discussion with the patient regarding his episodes of positional benefit sounds like benign paroxysmal positional vertigo.  Recommend continue meclizine as needed and refer to physical therapy for vestibular stabilization exercises.  I advised him to get up slowly and avoid sudden movements.  Aspirin for stroke prevention and aggressive risk factor modification with strict control of hypertension with blood pressure goal below 130/80, lipids with LDL cholesterol goal below 70 mg percent and diabetes with hemoglobin A1c goal below 6.5%.  He was advised to clean her walker at all times.  Follow-up in the future with Irish Elders, nurse practitioner in 3 months or call earlier if necessary    Benign Positional Vertigo Vertigo is the feeling that you or your surroundings are moving when they are not. Benign positional vertigo is the most common form of vertigo. This is usually a harmless condition (benign). This condition is positional. This means that symptoms are triggered by certain movements and positions. This condition can be dangerous if it occurs while you are doing something that could cause harm to yourself or others. This includes activities such as driving or operating machinery. What are the causes? The inner ear has fluid-filled canals that help your brain sense movement and balance. When the fluid moves, the brain receives messages about your body's position. With benign positional vertigo, calcium crystals in the inner ear break free and disturb the inner ear area. This causes your brain to receive confusing messages about your body's position. What increases the risk? You are more likely to develop this condition if: You are a woman. You are 68 years of age or older. You have recently had a head injury. You have an inner ear disease. What are the signs or symptoms? Symptoms of this condition usually happen when you move your head or your eyes in different directions. Symptoms  may start suddenly and usually last for less than a minute. They include: Loss of balance and falling. Feeling like you are spinning or moving. Feeling like your surroundings are spinning or moving. Nausea and vomiting. Blurred vision. Dizziness. Involuntary eye movement (nystagmus). Symptoms can be mild and cause only minor problems, or they can be severe and interfere with daily life. Episodes of benign positional vertigo may return (recur) over time. Symptoms may also improve over time. How is this diagnosed? This condition may be diagnosed based on: Your medical history. A physical exam of the head, neck, and ears. Positional tests to check for or stimulate vertigo. You may be asked to turn your head and change positions, such as going from sitting to lying down. A health care provider will watch for symptoms of vertigo. You may be referred to a health care provider who specializes in ear, nose, and throat problems (ENT or otolaryngologist) or a provider who specializes in disorders of the nervous system (neurologist). How is this treated?  This condition may be treated in a session in which your health care provider moves your head in specific positions to help the displaced crystals in your inner ear move. Treatment for this condition may take several sessions. Surgery may be needed in severe cases, but this is rare. In some cases, benign positional vertigo may resolve on its own in 2-4 weeks. Follow these instructions at home: Safety Move slowly. Avoid sudden body or head movements or certain positions, as told by your health care provider. Avoid driving or operating machinery until your health care provider says it is safe. Avoid doing any tasks that  would be dangerous to you or others if vertigo occurs. If you have trouble walking or keeping your balance, try using a cane for stability. If you feel dizzy or unstable, sit down right away. Return to your normal activities as told by  your health care provider. Ask your health care provider what activities are safe for you. General instructions Take over-the-counter and prescription medicines only as told by your health care provider. Drink enough fluid to keep your urine pale yellow. Keep all follow-up visits. This is important. Contact a health care provider if: You have a fever. Your condition gets worse or you develop new symptoms. Your family or friends notice any behavioral changes. You have nausea or vomiting that gets worse. You have numbness or a prickling and tingling sensation. Get help right away if you: Have difficulty speaking or moving. Are always dizzy or faint. Develop severe headaches. Have weakness in your legs or arms. Have changes in your hearing or vision. Develop a stiff neck. Develop sensitivity to light. These symptoms may represent a serious problem that is an emergency. Do not wait to see if the symptoms will go away. Get medical help right away. Call your local emergency services (911 in the [place] Do not drive yourself to the hospital. Summary Vertigo is the feeling that you or your surroundings are moving when they are not. Benign positional vertigo is the most common form of vertigo. This condition is caused by calcium crystals in the inner ear that become displaced. This causes a disturbance in an area of the inner ear that helps your brain sense movement and balance. Symptoms include loss of balance and falling, feeling that you or your surroundings are moving, nausea and vomiting, and blurred vision. This condition can be diagnosed based on symptoms, a physical exam, and positional tests. Follow safety instructions as told by your health care provider and keep all follow-up visits. This is important. This information is not intended to replace advice given to you by your health care provider. Make sure you discuss any questions you have with your health care provider. Document Revised:  10/20/2020 Document Reviewed: 10/20/2020 Elsevier Patient Education  [place].

## 2022-11-30 NOTE — Progress Notes (Signed)
[facility] [address] [place]. [place] [address] [phone]       OFFICE CONSULT NOTE  [name] Date of Birth:  05-18-1961 Medical Record Number:  [id number]   Referring MD: Kenneth Courier PA-C  Reason for Referral: Vertigo  HPI: Kenneth [name] is a 61 year old Caucasian male seen today for initial office consultation visit for vertigo.  He is accompanied by his sister.  History is obtained from them and review of electronic medical records.  I personally reviewed pertinent available imaging films in PACS.  He has past medical history of hypertension, hyperlipidemia, diabetes, diabetic neuropathy, chronic calcific pancreatitis, metastatic melanoma, chronic kidney disease.  He states that the dizziness has apparently been he woke up 1 day and noticed when he woke up with sudden weight he had sensation of room spinning and felt slightly nauseous.  He did not throw up.  Dizziness was mostly positional and transient and improved he moves slowly.  He was prescribed meclizine which initially took for 3 3 times daily which seemed to help and then after a week or so he stopped it.  However the dizziness came back but not as bad.  He is currently taking it twice daily and seems to help.  He was seen in the ER on 11/03/2022.  CT scan of the head was unremarkable.  MRI was also obtained which showed no acute abnormality.  Old lacunar infarcts are noted.  He does have prior history of right thalamic and left pontine lacunar strokes in February 2023.  At that time workup had shown CT angiogram.  Echocardiogram showed EF 65%.  LDL cholesterol 47 and A1c 6.6.30-day heart monitor was negative for paroxysmal A-fib.  However his last lab work on 11/07/2022 showed elevated CRP 9.1 and LDL cholesterol 33 mg percent.  Patient has been on aspirin for stroke prevention and tolerating it well without bleeding or bruising.  He is also on Lipitor 20 mg and tolerating it well.  Well-controlled today  it is 128/75.  He does have mild gait imbalance secondary to his diabetic neuropathy which appears unchanged.  He admits to ringing sound in his years but denies decreased hearing.  He denies any recurrent ear infections, ear trauma or major motorcycle accident.  ROS:   14 system review of systems is positive for dizziness, imbalance, tingling, numbness, falls all other systems negative  PMH:  Past Medical History:  Diagnosis Date   Alcohol dependence ([place])    Chronic calcific pancreatitis ([facility])    CVA (cerebral vascular accident) ([place]) 01/24/2022   Diabetes mellitus without complication ([place])    Hyperlipidemia    Hypertension    Melanoma ([facility]) 2013   metastatic - resected and cured with chemo/immuno tx   Pancreatic cyst-mass?    Polyneuropathy     Social History:  Social History   Socioeconomic History   Marital status: Single    Spouse name: Not on file   Number of children: 0   Years of education: Not on file   Highest education level: Bachelor's degree (e.g., BA, AB, BS)  Occupational History   Occupation: Receptionist    Comment: [employer]  Tobacco Use   Smoking status: Never   Smokeless tobacco: Former    Types: Snuff  Vaping Use   Vaping Use: Never used  Substance and Sexual Activity   Alcohol use: Not Currently    Comment: Historyof alcohol abuse (12-16 beers/day)   Drug use: No   Sexual activity: Yes  Other Topics Concern   Not on file  Social History Narrative   Single   Has supportive sisters   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 90 capsule 3   aspirin EC 81 MG EC tablet Take 1 tablet (81 mg total) by mouth daily. Swallow whole. 30 tablet 11   atorvastatin (LIPITOR) 20 MG tablet TAKE 1 TABLET BY MOUTH DAILY 90 tablet 3   feeding supplement (ENSURE ENLIVE / ENSURE PLUS) LIQD Take 237 mLs by mouth 3 (three) times daily between meals. 237 mL 12   magnesium oxide (MAG-OX) 400 (240 Mg) MG tablet Take 1 tablet (400 mg total) by mouth 2 (two) times daily.     meclizine (ANTIVERT) 25 MG tablet Take 1 tablet (25 mg total) by mouth 3 (three) times daily as needed for dizziness. 30 tablet 0   metFORMIN (GLUCOPHAGE) 1000 MG tablet TAKE 1 TABLET BY MOUTH TWICE DAILY 180 tablet 3   metoprolol tartrate (LOPRESSOR) 100 MG tablet TAKE 1 TABLET BY MOUTH TWICE DAILY 180 tablet 3   mirtazapine (REMERON) 7.5 MG tablet Take 1 tablet (7.5 mg total) by mouth at bedtime. 90 tablet 3   Multiple Vitamin (MULTIVITAMIN WITH MINERALS) TABS tablet Take 1 tablet by mouth daily.     pioglitazone (ACTOS) 30 MG tablet Take 1 tablet (30 mg total) by mouth daily. 30 tablet 5   tamsulosin (FLOMAX) 0.4 MG CAPS capsule Take 0.4 mg by mouth daily.     triamcinolone cream (KENALOG) 0.1 % Apply small amount topically to areas of scaly rash on face and neck once a day as needed. 30 g 5   No current facility-administered medications on file prior to visit.    Allergies:   Allergies  Allergen Reactions   Cat Hair Extract Shortness Of Breath and Swelling    Swelling, watery eyes    Physical Exam General: well developed, well nourished middle-aged Caucasian male, seated, in no evident distress Head: head normocephalic and atraumatic.   Neck: supple with no carotid or supraclavicular bruits Cardiovascular: regular rate and rhythm, no murmurs Musculoskeletal: no  deformity Skin:  no rash/petichiae Vascular:  Normal pulses all extremities  Neurologic Exam Mental Status: Awake and fully alert. Oriented to place and time. Recent and remote memory intact. Attention span, concentration and fund of knowledge appropriate. Mood and affect appropriate.  Cranial Nerves: Fundoscopic exam reveals sharp disc margins. Pupils equal, briskly reactive to light. Extraocular movements full without nystagmus. Visual fields full to confrontation. Hearing mildly diminished bilaterally.. Facial sensation intact. Face, tongue, palate moves normally and symmetrically.  Motor: Normal bulk and tone. Normal strength in all tested extremity muscles. Sensory.: intact to touch , pinprick , slightly diminished position and vibratory sensation over toes bilaterally..  Coordination: Rapid alternating movements normal in all extremities. Finger-to-nose and heel-to-shin performed accurately bilaterally.  Unable to do f Fukuda stepping test.  Headshaking is negative  for vertigo or nystagmus. Gait and Station: Arises from chair without difficulty. Stance is normal. Gait  cautious and wide-based.  Unsteady on narrow-based unsupported.  Romberg sign is positive.  Unable to do tandem walking. Reflexes: 1+ and symmetric except ankle jerks are depressed.. Toes downgoing.   NIHSS  0 Modified Rankin  2   ASSESSMENT: 61 year old Caucasian male with new onset of positional dizziness in December 2023 likely paroxysmal benign positional vertigo.  History of right thalamic and left pontine lacunar strokes in February 2023.     PLAN:I had a long discussion with the patient regarding his episodes of positional benefit sounds like benign paroxysmal positional vertigo.  Recommend continue meclizine as needed and refer to physical therapy for vestibular stabilization exercises.  I advised him to get up slowly and avoid sudden movements.  Aspirin for stroke prevention and aggressive risk factor modification  with strict control of hypertension with blood pressure goal below 130/80, lipids with LDL cholesterol goal below 70 mg percent and diabetes with hemoglobin A1c goal below 6.5%.  He was advised to clean her walker at all times.  Follow-up in the future with Irish Elders, nurse practitioner in 3 months or call earlier if necessary.  Greater than 50% time during this 45-minute consultation visit was spent on counseling and coordination of care about dizziness, vertigo and lacunar stroke and answering questions. Antony Contras, MD  Note: This document was prepared with digital dictation and possible smart phrase technology. Any transcriptional errors that result from this process are unintentional.

## 2022-12-06 NOTE — Therapy (Signed)
OUTPATIENT PHYSICAL THERAPY VESTIBULAR EVALUATION     Patient Name: Kenneth [name] MRN: [id number] DOB:10-Feb-1961, 62 y.o., male Today's Date: 12/07/2022  END OF SESSION:  PT End of Session - 12/07/22 1008     Visit Number 1    Number of Visits 9    Date for PT Re-Evaluation 01/06/23    Authorization Type [employer] Medicaid    PT Start Time 0848    PT Stop Time 0940    PT Time Calculation (min) 52 min    Activity Tolerance Patient tolerated treatment well   limited by emesis   Behavior During Therapy [facility] for tasks assessed/performed             Past Medical History:  Diagnosis Date   Alcohol dependence ([place])    Chronic calcific pancreatitis ([place])    CVA (cerebral vascular accident) ([place]) 01/24/2022   Diabetes mellitus without complication ([place])    Hyperlipidemia    Hypertension    Melanoma ([place]) 2013   metastatic - resected and cured with chemo/immuno tx   Pancreatic cyst-mass?    Polyneuropathy    Past Surgical History:  Procedure Laterality Date   BIOPSY  02/03/2022   Procedure: BIOPSY;  Surgeon: Gatha Mayer, MD;  Location: [facility] ENDOSCOPY;  Service: Gastroenterology;;   BRAIN SURGERY  2015   to check for possible malignancy from melanoma, result were scar tissue   COLONOSCOPY WITH PROPOFOL N/A 02/03/2022   Procedure: COLONOSCOPY WITH PROPOFOL;  Surgeon: Gatha Mayer, MD;  Location: [facility] ENDOSCOPY;  Service: Gastroenterology;  Laterality: N/A;   ESOPHAGOGASTRODUODENOSCOPY (EGD) WITH PROPOFOL N/A 02/03/2022   Procedure: ESOPHAGOGASTRODUODENOSCOPY (EGD) WITH PROPOFOL;  Surgeon: Gatha Mayer, MD;  Location: [facility];  Service: Gastroenterology;  Laterality: N/A;   melanoma exicison     right leg   Patient Active Problem List   Diagnosis Date Noted   Seborrhea 11/07/2022   History of melanoma 04/12/2022   Alcohol dependence in remission ([place]) 04/12/2022   Aortic atherosclerosis ([place]) 04/12/2022   Alcohol-induced chronic pancreatitis ([place]) 03/15/2022   History  of CVA (cerebrovascular accident) 02/28/2022   Protein-calorie malnutrition, severe 02/03/2022   Gastritis and gastroduodenitis    Normocytic anemia 02/02/2022   Occult GI bleeding 02/01/2022   DNR (do not resuscitate) 02/01/2022   Vertigo 01/24/2022   ARF (acute renal failure) ([place]) 01/24/2022   Elevated PSA 11/24/2021   Hypokalemia 11/24/2021   History of immunotherapy 11/25/2019   Hx of colonic polyps 03/28/2017   Melanoma metastatic to brain ([place]) 08/08/2016   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[place]) 08/13/2014   Metastatic melanoma to lung ([place]) 12/12/2012   Type 2 diabetes mellitus ([place]) 08/20/2007   Dyslipidemia 08/20/2007   Essential hypertension 08/19/2007    PCP: Haydee Salter, MD  REFERRING PROVIDER: Garvin Fila, MD  REFERRING DIAG: R42 (ICD-10-CM) - Vertigo H81.10 (ICD-10-CM) - Benign paroxysmal positional vertigo, unspecified laterality  THERAPY DIAG:  Dizziness and giddiness  Unsteadiness on feet  BPPV (benign paroxysmal positional vertigo), right  BPPV (benign paroxysmal positional vertigo), left  ONSET DATE: 11/30/2022  Rationale for Evaluation and Treatment: Rehabilitation  SUBJECTIVE:   SUBJECTIVE STATEMENT: Reports dizziness started at the beginning of December of 2023. Reports got up that morning and was dizzy and went to the ER. CT and MRI showed no acute abnormality. Felt like his head was wobbling. When he gets up from lying down he is dizzy in the morning. Reports that it gets better throughout the day. Denies lightheadedness. No nausea. Has been using  a cane for a while for balance due to neuropathy. No falls. Takes Meclizine 2x a day, took it this morning.  Pt accompanied by: family member sister [place]   PERTINENT HISTORY: He has past medical history of hypertension, hyperlipidemia, diabetes, diabetic neuropathy, chronic calcific pancreatitis, metastatic melanoma, chronic kidney disease, hx of alcohol dependence, hx of brain surgery 2015, history of  right thalamic and left pontine lacunar strokes in February 2023.  He was seen in the ER on 11/03/2022. CT scan of the head was unremarkable. MRI was also obtained which showed no acute abnormality. Old lacunar infarcts are noted. He does have prior history of right thalamic and left pontine lacunar strokes in February 2023.  He states that the dizziness has apparently been he woke up 1 day and noticed when he woke up with sudden weight he had sensation of room spinning and felt slightly nauseous. He did not throw up. Dizziness was mostly positional and transient and improved he moves slowly.  PAIN:  Are you having pain? No  Vitals:   12/07/22 0902  BP: (!) 145/78  Pulse: (!) 55     PRECAUTIONS: None  WEIGHT BEARING RESTRICTIONS: No  FALLS: Has patient fallen in last 6 months? No  LIVING ENVIRONMENT: Lives with:  lives with sister  Lives in: House/apartment Stairs: Yes: External: 3 steps; can reach both, can live on the ground floor  Has following equipment at home: Single point cane, Walker - 2 wheeled, and Grab bars  PLOF: Independent  PATIENT GOALS: Wants to not be dizzy, improve stability.   OBJECTIVE:   DIAGNOSTIC FINDINGS: MRI brain 11/04/22:  IMPRESSION: 1. No acute intracranial abnormality. 2. Multiple old small vessel infarcts of the deep gray nuclei and findings of chronic small vessel ischemia. 3. Chronic left pontine microhemorrhage.  CT angio head/neck 01/25/2022 IMPRESSION: 1.  No intracranial large vessel occlusion or significant stenosis. 2.  No hemodynamically significant stenosis in the neck. 3. Incidental note is made of an aberrant origin of the right subclavian artery.  COGNITION: Overall cognitive status: Within functional limits for tasks assessed   POSTURE:  rounded shoulders and forward head  Cervical ROM:    WFL for cervical rotation/flexion, more limited in extension   TRANSFERS: Assistive device utilized: None  Sit to stand:  SBA Stand to sit: SBA Uses BUE support to stand   GAIT: Gait pattern: step through pattern, trunk flexed, and wide BOS Distance walked: Clinic distances back to tx room  Assistive device utilized: Single point cane Level of assistance: SBA Comments: After episode of emesis, pt able to ambulate with supervision with cane   PATIENT SURVEYS:  FOTO DFS: 65, DPS: 58.6   VESTIBULAR ASSESSMENT:  GENERAL OBSERVATION: Ambulates in with SPC with supervision with wider BOS   SYMPTOM BEHAVIOR:  Subjective history: See above.   Non-Vestibular symptoms: tinnitus  Type of dizziness: "Funny feeling in the head" and "World moves"  Frequency: A couple times a week when getting out of bed.   Duration: A couple minutes   Aggravating factors: Induced by position change: supine to sit and closing eyes and looking up.   Relieving factors:  sitting down and relaxing   Progression of symptoms: better  OCULOMOTOR EXAM:  Ocular Alignment: normal  Ocular ROM: No Limitations  Spontaneous Nystagmus: absent  Gaze-Induced Nystagmus: absent  Smooth Pursuits: intact  Saccades: intact and slow    VESTIBULAR - OCULAR REFLEX:   Slow VOR: Normal, seemed to be a little jumpy at times  VOR Cancellation: Comment: difficulty maintaining gaze at times when going to the L. No dizziness    Head-Impulse Test: HIT Right: positive HIT Left: positive Pt reporting moderate dizziness afterwards, pt had difficulty keeping eyes on therapist's nose   Dynamic Visual Acuity: Did not test during eval.    POSITIONAL TESTING: Right Dix-Hallpike: upbeating, right nystagmus, pt needing to initially close his eyes due to dizziness, and Duration: lasting approx. 15 seconds  Left Dix-Hallpike: no nystagmus and no dizziness in position, but pt reporting dizziness when coming to sit upright Right Roll Test: apogeotropic nystagmus, symptoms worse on right side, and also appeared that nystagmus had a rotary component, lasted approx. 60  seconds  Left Roll Test: apogeotropic nystagmus and only lasting approx. 10-15 seconds, more mild symptoms, pt's nystagmus also had a bit of a rotary component to it.   Dix-Hallpike performed with pillow under pt's back due to limited cervical extension.   MOTION SENSITIVITY:  Motion Sensitivity Quotient Intensity: 0 = none, 1 = Lightheaded, 2 = Mild, 3 = Moderate, 4 = Severe, 5 = Vomiting  Intensity  1. Sitting to supine 2  2. Supine to L side 2  3. Supine to R side 3  4. Supine to sitting 2  5. L Hallpike-Dix 0  6. Up from L  2  7. R Hallpike-Dix 4  8. Up from R  4  9. Sitting, head tipped to L knee 2  10. Head up from L knee 2  11. Sitting, head tipped to R knee 0  12. Head up from R knee 4  13. Sitting head turns x5 3  14.Sitting head nods x5 3  15. In stance, 180 turn to L    16. In stance, 180 turn to R      VESTIBULAR TREATMENT:                                                                                                   DATE: 12/07/22  Canalith Repositioning:  Epley Right: Number of Reps: 1, Response to Treatment: symptoms worsened/converted, and Comment: Pt with dizziness/upbeating rotary nystagmus in positions 1 and 2, when in position 3 in sidelying, pt with reports of severe dizziness and needing to be seated upright and then PT immediately grabbing a trash can and pt having an episode of emesis. Unsure if canals converted to horizontal or pt had a significant reaction during this position. Unable to fully complete Epley maneuver.     PATIENT EDUCATION: Education details: Clinical findings, POC, Etiology of BPPV and treatement/purpose of Epley maneuver, provided handout on BPPV.  Person educated: Patient and pt's sister  Education method: Explanation and Handouts Education comprehension: verbalized understanding  HOME EXERCISE PROGRAM: Will provide at a future session   GOALS: Goals reviewed with patient? Yes  SHORT TERM GOALS: ALL STGS= LTGS   LONG  TERM GOALS: Target date: 01/04/2023  Pt will be independent with final HEP for vestibular deficits in order to build upon functional gains made in therapy. Baseline:  Goal status: INITIAL  2.  Pt will demonstrate negative positional testing  in order to demo resolution of BPPV to decr dizziness.  Baseline:  Goal status: INITIAL  3.  Pt will perform items on MSQ with a 1/5 rating or less in order to demo improved dizziness.  Baseline:  Goal status: INITIAL  4.  Pt will undergo discharge FOTO.  Baseline:  Goal status: INITIAL  5.  Further balance testing to be assessed such as mCTSIB as needed.  Baseline:  Goal status: INITIAL    ASSESSMENT:  CLINICAL IMPRESSION: Patient is a 62 year old male referred to Neuro OPPT for Vertigo/BPPV.  The following deficits were present during the exam: oculomotor impairments with VOR and positive HIT test bilat (with pt having moderate dizziness afterwards) With positional testing, pt with what it appeared to be apogeotropic nystagmus with a rotary component with roll test worse with R>L . With Dix-Hallpike testing (modified with pillow behind pt's back due to limited cervical extension), pt with R upbeating rotary nystagmus lasting approx. 15 seconds in R [employer] position and pt having severe dizziness. Attempted to treat with the Epley, but when in sidelying position 3, pt had severe dizziness and then needing to sit back upright immediately to have an episode of emesis. Unable to tell if canals converted in the position. Provided pt with a wet washcloth and pt had prolonged seated rest. Pt feeling better at end of session and able to ambulate out with [facility] and supervision.  Pt would benefit from skilled PT to address these impairments and decr pt's dizziness.    OBJECTIVE IMPAIRMENTS: Abnormal gait, decreased activity tolerance, decreased balance, difficulty walking, dizziness, and postural dysfunction.   ACTIVITY LIMITATIONS: standing, stairs, bed  mobility, reach over head, and locomotion level  PARTICIPATION LIMITATIONS: driving and community activity  PERSONAL FACTORS: Age, Behavior pattern, Past/current experiences, Time since onset of injury/illness/exacerbation, and 3+ comorbidities:  hypertension, hyperlipidemia, diabetes, diabetic neuropathy, chronic calcific pancreatitis, metastatic melanoma, chronic kidney disease, hx of alcohol dependence, hx of brain surgery 2015, history of right thalamic and left pontine lacunar strokes in February 2023  are also affecting patient's functional outcome.   REHAB POTENTIAL: Good  CLINICAL DECISION MAKING: Evolving/moderate complexity  EVALUATION COMPLEXITY: Moderate   PLAN:  PT FREQUENCY: 2x/week  PT DURATION: 4 weeks  PLANNED INTERVENTIONS: Therapeutic exercises, Therapeutic activity, Neuromuscular re-education, Balance training, Gait training, Patient/Family education, Self Care, Vestibular training, Canalith repositioning, and Re-evaluation  PLAN FOR NEXT SESSION: Re-assess canals and treat. Seemed to be R posterior canal. Pt had episode of emesis in 3rd position. Might need to try [place]?    Arliss Journey, PT, DPT  12/07/2022, 10:09 AM  Check all possible CPT codes: [phone] - PT Re-evaluation, 97110- Therapeutic Exercise, [phone]- Neuro Re-education, [phone] - Gait Training, [phone] - Manual Therapy, [phone] - Therapeutic Activities, [phone] - Self Care, and [phone] - Canalith Repositioning    Check all conditions that are expected to impact treatment: Diabetes mellitus and Neurological condition   If treatment provided at initial evaluation, no treatment charged due to lack of authorization.

## 2022-12-07 ENCOUNTER — Ambulatory Visit: Payer: Medicaid Other | Attending: Family Medicine | Admitting: Physical Therapy

## 2022-12-07 ENCOUNTER — Encounter: Payer: Self-pay | Admitting: Physical Therapy

## 2022-12-07 VITALS — BP 145/78 | HR 55

## 2022-12-07 DIAGNOSIS — H8112 Benign paroxysmal vertigo, left ear: Secondary | ICD-10-CM | POA: Insufficient documentation

## 2022-12-07 DIAGNOSIS — H8111 Benign paroxysmal vertigo, right ear: Secondary | ICD-10-CM | POA: Diagnosis present

## 2022-12-07 DIAGNOSIS — R2681 Unsteadiness on feet: Secondary | ICD-10-CM | POA: Insufficient documentation

## 2022-12-07 DIAGNOSIS — R42 Dizziness and giddiness: Secondary | ICD-10-CM | POA: Diagnosis present

## 2022-12-07 DIAGNOSIS — H8110 Benign paroxysmal vertigo, unspecified ear: Secondary | ICD-10-CM | POA: Insufficient documentation

## 2022-12-12 ENCOUNTER — Ambulatory Visit: Payer: Medicaid Other | Admitting: Physical Therapy

## 2022-12-12 ENCOUNTER — Encounter: Payer: Self-pay | Admitting: Physical Therapy

## 2022-12-12 DIAGNOSIS — R42 Dizziness and giddiness: Secondary | ICD-10-CM

## 2022-12-12 DIAGNOSIS — H8111 Benign paroxysmal vertigo, right ear: Secondary | ICD-10-CM

## 2022-12-12 DIAGNOSIS — H8112 Benign paroxysmal vertigo, left ear: Secondary | ICD-10-CM

## 2022-12-12 DIAGNOSIS — R2681 Unsteadiness on feet: Secondary | ICD-10-CM

## 2022-12-12 NOTE — Therapy (Signed)
OUTPATIENT PHYSICAL THERAPY VESTIBULAR TREATMENT     Patient Name: Kenneth [name] MRN: [id number] DOB:05/09/1961, 62 y.o., male Today's Date: 12/12/2022  END OF SESSION:  PT End of Session - 12/12/22 0850     Visit Number 2    Number of Visits 9    Date for PT Re-Evaluation 01/06/23    Authorization Type [employer] Medicaid    PT Start Time 0848    PT Stop Time 0925    PT Time Calculation (min) 37 min    Activity Tolerance Patient tolerated treatment well    Behavior During Therapy [facility] for tasks assessed/performed             Past Medical History:  Diagnosis Date   Alcohol dependence ([place])    Chronic calcific pancreatitis ([place])    CVA (cerebral vascular accident) ([place]) 01/24/2022   Diabetes mellitus without complication ([place])    Hyperlipidemia    Hypertension    Melanoma ([place]) 2013   metastatic - resected and cured with chemo/immuno tx   Pancreatic cyst-mass?    Polyneuropathy    Past Surgical History:  Procedure Laterality Date   BIOPSY  02/03/2022   Procedure: BIOPSY;  Surgeon: Gatha Mayer, MD;  Location: [facility] ENDOSCOPY;  Service: Gastroenterology;;   BRAIN SURGERY  2015   to check for possible malignancy from melanoma, result were scar tissue   COLONOSCOPY WITH PROPOFOL N/A 02/03/2022   Procedure: COLONOSCOPY WITH PROPOFOL;  Surgeon: Gatha Mayer, MD;  Location: [facility] ENDOSCOPY;  Service: Gastroenterology;  Laterality: N/A;   ESOPHAGOGASTRODUODENOSCOPY (EGD) WITH PROPOFOL N/A 02/03/2022   Procedure: ESOPHAGOGASTRODUODENOSCOPY (EGD) WITH PROPOFOL;  Surgeon: Gatha Mayer, MD;  Location: [place];  Service: Gastroenterology;  Laterality: N/A;   melanoma exicison     right leg   Patient Active Problem List   Diagnosis Date Noted   Seborrhea 11/07/2022   History of melanoma 04/12/2022   Alcohol dependence in remission ([place]) 04/12/2022   Aortic atherosclerosis ([place]) 04/12/2022   Alcohol-induced chronic pancreatitis ([place]) 03/15/2022   History of CVA  (cerebrovascular accident) 02/28/2022   Protein-calorie malnutrition, severe 02/03/2022   Gastritis and gastroduodenitis    Normocytic anemia 02/02/2022   Occult GI bleeding 02/01/2022   DNR (do not resuscitate) 02/01/2022   Vertigo 01/24/2022   ARF (acute renal failure) ([place]) 01/24/2022   Elevated PSA 11/24/2021   Hypokalemia 11/24/2021   History of immunotherapy 11/25/2019   Hx of colonic polyps 03/28/2017   Melanoma metastatic to brain ([place]) 08/08/2016   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[place]) 08/13/2014   Metastatic melanoma to lung ([place]) 12/12/2012   Type 2 diabetes mellitus ([place]) 08/20/2007   Dyslipidemia 08/20/2007   Essential hypertension 08/19/2007    PCP: Haydee Salter, MD  REFERRING PROVIDER: Garvin Fila, MD  REFERRING DIAG: R42 (ICD-10-CM) - Vertigo H81.10 (ICD-10-CM) - Benign paroxysmal positional vertigo, unspecified laterality  THERAPY DIAG:  Dizziness and giddiness  Unsteadiness on feet  BPPV (benign paroxysmal positional vertigo), right  BPPV (benign paroxysmal positional vertigo), left  ONSET DATE: 11/30/2022  Rationale for Evaluation and Treatment: Rehabilitation  SUBJECTIVE:   SUBJECTIVE STATEMENT: Still having some dizziness, but reports it feels better than the last time he was here. After his episode of emesis during eval, he felt good the felt of the day. No falls. Took his Meclizine this morning.   Pt accompanied by: family member sister Kenneth [name] (in waiting room)  PERTINENT HISTORY: He has past medical history of hypertension, hyperlipidemia, diabetes, diabetic neuropathy, chronic calcific pancreatitis, metastatic  melanoma, chronic kidney disease, hx of alcohol dependence, hx of brain surgery 2015, history of right thalamic and left pontine lacunar strokes in February 2023.  He was seen in the ER on 11/03/2022. CT scan of the head was unremarkable. MRI was also obtained which showed no acute abnormality. Old lacunar infarcts are noted. He does have  prior history of right thalamic and left pontine lacunar strokes in February 2023.  He states that the dizziness has apparently been he woke up 1 day and noticed when he woke up with sudden weight he had sensation of room spinning and felt slightly nauseous. He did not throw up. Dizziness was mostly positional and transient and improved he moves slowly.  PAIN:  Are you having pain? No  There were no vitals filed for this visit.    PRECAUTIONS: None  WEIGHT BEARING RESTRICTIONS: No  FALLS: Has patient fallen in last 6 months? No  LIVING ENVIRONMENT: Lives with:  lives with sister  Lives in: House/apartment Stairs: Yes: External: 3 steps; can reach both, can live on the ground floor  Has following equipment at home: Single point cane, Walker - 2 wheeled, and Grab bars  PLOF: Independent  PATIENT GOALS: Wants to not be dizzy, improve stability.   OBJECTIVE:   DIAGNOSTIC FINDINGS: MRI brain 11/04/22:  IMPRESSION: 1. No acute intracranial abnormality. 2. Multiple old small vessel infarcts of the deep gray nuclei and findings of chronic small vessel ischemia. 3. Chronic left pontine microhemorrhage.  CT angio head/neck 01/25/2022 IMPRESSION: 1.  No intracranial large vessel occlusion or significant stenosis. 2.  No hemodynamically significant stenosis in the neck. 3. Incidental note is made of an aberrant origin of the right subclavian artery.   GAIT: Gait pattern: step through pattern, trunk flexed, and wide BOS Distance walked: Clinic distances back to tx room  Assistive device utilized: Single point cane Level of assistance: SBA Comments: Into and out of tx room   VESTIBULAR ASSESSMENT:   POSITIONAL TESTING: Right Roll Test: apogeotropic nystagmus, symptoms worse on right side, and initially lasting approx. 60 seconds, upon re-assessments during session, very mild nystagmus lasting <30 seconds. Pt reporting only very minimal dizziness initially in this position.  Left  Roll Test: apogeotropic nystagmus and very mild lasting <15-20 seconds. Pt reporting no dizziness in this position.  Right Sidelying: upbeating, right nystagmus and initially appeared to be upbeating/right nystagmus lasting about 10 seconds. After further assessment, appeared to not have a rotary component and just be upbeating, more indicative of horizontal canal.  Left Sidelying: no nystagmus  Pt with neck stiffness, so roll test performed with pt rolling to R/L   After 1 rep on BBQ roll to treat R horizontal canal cupulolithiasis, with re-assessment pt with no nystagmus in R or L sidelying positions or R roll test. Pt only with mild nystagmus with L roll test with pt reporting only minimal dizziness.   VESTIBULAR TREATMENT:                                                                                                   DATE: 12/12/22  Canalith Repositioning:  Semont Right Posterior: Number of Reps: 1 and Response to Treatment: comment: pt reporting incr dizziness in 2nd position lasting about 10 seconds. No change in R sidelying test afterwards, indicating more horizontal canal than posterior canal. and BBQ Roll Right: Number of Reps: 1 and Response to Treatment: symptoms improved     PATIENT EDUCATION: Education details: Clinical findings, BPPV education and horizontal canal, discussed findings after positional testing today. Pt returns tomorrow - educated on performing repeated rolling x5 reps to each side approx. 3 times daily to see if that helps clear up remaining horizontal canal BPPV  Person educated: Patient and   Education method: Explanation and Demonstration Education comprehension: verbalized understanding and returned demonstration  HOME EXERCISE PROGRAM: Will provide at a future session   GOALS: Goals reviewed with patient? Yes  SHORT TERM GOALS: ALL STGS= LTGS   LONG TERM GOALS: Target date: 01/04/2023  Pt will be independent with final HEP for vestibular deficits in  order to build upon functional gains made in therapy. Baseline:  Goal status: INITIAL  2.  Pt will demonstrate negative positional testing in order to demo resolution of BPPV to decr dizziness.  Baseline:  Goal status: INITIAL  3.  Pt will perform items on MSQ with a 1/5 rating or less in order to demo improved dizziness.  Baseline:  Goal status: INITIAL  4.  Pt will undergo discharge FOTO.  Baseline:  Goal status: INITIAL  5.  Further balance testing to be assessed such as mCTSIB as needed.  Baseline:  Goal status: INITIAL    ASSESSMENT:  CLINICAL IMPRESSION: Pt reporting still having dizziness since he was last here, but has had an improvement. Re-assessed canals, and at first it looked like R upbeating nystagmus in R sidelying position. Treated with Semont maneuver, with no improvement. RE-assessed roll testing and pt having R apogeotropic nystagmus for ~60 seconds with R roll test, indicating R horizontal cupulolithiasis. Pt also has apogeotropic nystagmus going to L side, but not lasting as long. Pt reporting minimal dizziness in this dizziness. Performed the BBQ roll to the R x1 rep with pt tolerating well. With re-assessment at end of session, pt only demonstrating mild L apogeotropic nystagmus in L roll test position with pt reporting minimal dizziness. Educated on performing repeated rolling at home to see if this will clear remaining horizontal canal BPPV. Pt able to tolerate session well today with no episodes of emesis. Will continue per POC.    OBJECTIVE IMPAIRMENTS: Abnormal gait, decreased activity tolerance, decreased balance, difficulty walking, dizziness, and postural dysfunction.   ACTIVITY LIMITATIONS: standing, stairs, bed mobility, reach over head, and locomotion level  PARTICIPATION LIMITATIONS: driving and community activity  PERSONAL FACTORS: Age, Behavior pattern, Past/current experiences, Time since onset of injury/illness/exacerbation, and 3+  comorbidities:  hypertension, hyperlipidemia, diabetes, diabetic neuropathy, chronic calcific pancreatitis, metastatic melanoma, chronic kidney disease, hx of alcohol dependence, hx of brain surgery 2015, history of right thalamic and left pontine lacunar strokes in February 2023  are also affecting patient's functional outcome.   REHAB POTENTIAL: Good  CLINICAL DECISION MAKING: Evolving/moderate complexity  EVALUATION COMPLEXITY: Moderate   PLAN:  PT FREQUENCY: 2x/week  PT DURATION: 4 weeks  PLANNED INTERVENTIONS: Therapeutic exercises, Therapeutic activity, Neuromuscular re-education, Balance training, Gait training, Patient/Family education, Self Care, Vestibular training, Canalith repositioning, and Re-evaluation  PLAN FOR NEXT SESSION: Re-assess horizontal canals. Treat as needed. Give any further exercises for balance/gaze stabilization.    Arliss Journey, PT, DPT  12/12/2022, 11:19  AM  Check all possible CPT codes: 28406 - PT Re-evaluation, 97110- Therapeutic Exercise, [phone]- Neuro Re-education, [phone] - Gait Training, [phone] - Manual Therapy, [phone] - Therapeutic Activities, [phone] - Self Care, and [phone] - Canalith Repositioning    Check all conditions that are expected to impact treatment: Diabetes mellitus and Neurological condition   If treatment provided at initial evaluation, no treatment charged due to lack of authorization.

## 2022-12-13 ENCOUNTER — Ambulatory Visit: Payer: Medicaid Other | Admitting: Physical Therapy

## 2022-12-13 ENCOUNTER — Encounter: Payer: Self-pay | Admitting: Physical Therapy

## 2022-12-13 DIAGNOSIS — R42 Dizziness and giddiness: Secondary | ICD-10-CM | POA: Diagnosis not present

## 2022-12-13 DIAGNOSIS — R2681 Unsteadiness on feet: Secondary | ICD-10-CM

## 2022-12-13 NOTE — Therapy (Signed)
OUTPATIENT PHYSICAL THERAPY VESTIBULAR TREATMENT     Patient Name: Kenneth [name] MRN: [id number] DOB:[birth year], 62 y.o., male Today's Date: 12/13/2022  END OF SESSION:  PT End of Session - 12/13/22 1233     Visit Number 3    Number of Visits 9    Date for PT Re-Evaluation 01/06/23    Authorization Type UHC Medicaid    PT Start Time 1232    PT Stop Time 1311    PT Time Calculation (min) 39 min    Activity Tolerance Patient tolerated treatment well    Behavior During Therapy [facility] for tasks assessed/performed             Past Medical History:  Diagnosis Date   Alcohol dependence ([place])    Chronic calcific pancreatitis ([place])    CVA (cerebral vascular accident) ([place]) 01/24/2022   Diabetes mellitus without complication ([place])    Hyperlipidemia    Hypertension    Melanoma ([place]) 2013   metastatic - resected and cured with chemo/immuno tx   Pancreatic cyst-mass?    Polyneuropathy    Past Surgical History:  Procedure Laterality Date   BIOPSY  02/03/2022   Procedure: BIOPSY;  Surgeon: Gatha Mayer, MD;  Location: [facility] ENDOSCOPY;  Service: Gastroenterology;;   BRAIN SURGERY  2015   to check for possible malignancy from melanoma, result were scar tissue   COLONOSCOPY WITH PROPOFOL N/A 02/03/2022   Procedure: COLONOSCOPY WITH PROPOFOL;  Surgeon: Gatha Mayer, MD;  Location: [facility] ENDOSCOPY;  Service: Gastroenterology;  Laterality: N/A;   ESOPHAGOGASTRODUODENOSCOPY (EGD) WITH PROPOFOL N/A 02/03/2022   Procedure: ESOPHAGOGASTRODUODENOSCOPY (EGD) WITH PROPOFOL;  Surgeon: Gatha Mayer, MD;  Location: [place];  Service: Gastroenterology;  Laterality: N/A;   melanoma exicison     right leg   Patient Active Problem List   Diagnosis Date Noted   Seborrhea 11/07/2022   History of melanoma 04/12/2022   Alcohol dependence in remission ([place]) 04/12/2022   Aortic atherosclerosis ([place]) 04/12/2022   Alcohol-induced chronic pancreatitis ([place]) 03/15/2022   History of CVA  (cerebrovascular accident) 02/28/2022   Protein-calorie malnutrition, severe 02/03/2022   Gastritis and gastroduodenitis    Normocytic anemia 02/02/2022   Occult GI bleeding 02/01/2022   DNR (do not resuscitate) 02/01/2022   Vertigo 01/24/2022   ARF (acute renal failure) ([place]) 01/24/2022   Elevated PSA 11/24/2021   Hypokalemia 11/24/2021   History of immunotherapy 11/25/2019   Hx of colonic polyps 03/28/2017   Melanoma metastatic to brain ([place]) 08/08/2016   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[place]) 08/13/2014   Metastatic melanoma to lung ([place]) 12/12/2012   Type 2 diabetes mellitus ([place]) 08/20/2007   Dyslipidemia 08/20/2007   Essential hypertension 08/19/2007    PCP: Haydee Salter, MD  REFERRING PROVIDER: Garvin Fila, MD  REFERRING DIAG: R42 (ICD-10-CM) - Vertigo H81.10 (ICD-10-CM) - Benign paroxysmal positional vertigo, unspecified laterality  THERAPY DIAG:  Dizziness and giddiness  Unsteadiness on feet  ONSET DATE: 11/30/2022  Rationale for Evaluation and Treatment: Rehabilitation  SUBJECTIVE:   SUBJECTIVE STATEMENT: Feeling better since yesterday. Had some dizziness when getting up this morning. Felt like he was spinning.  Practiced the rolling yesterday and not having dizziness anymore. Took his Meclizine this morning   Pt accompanied by: family member sister [name] (in waiting room)  PERTINENT HISTORY: He has past medical history of hypertension, hyperlipidemia, diabetes, diabetic neuropathy, chronic calcific pancreatitis, metastatic melanoma, chronic kidney disease, hx of alcohol dependence, hx of brain surgery 2015, history of right thalamic and left pontine  lacunar strokes in February 2023.  He was seen in the ER on 11/03/2022. CT scan of the head was unremarkable. MRI was also obtained which showed no acute abnormality. Old lacunar infarcts are noted. He does have prior history of right thalamic and left pontine lacunar strokes in February 2023.  He states that the  dizziness has apparently been he woke up 1 day and noticed when he woke up with sudden weight he had sensation of room spinning and felt slightly nauseous. He did not throw up. Dizziness was mostly positional and transient and improved he moves slowly.  PAIN:  Are you having pain? No  There were no vitals filed for this visit.    PRECAUTIONS: None  WEIGHT BEARING RESTRICTIONS: No  FALLS: Has patient fallen in last 6 months? No  LIVING ENVIRONMENT: Lives with:  lives with sister  Lives in: House/apartment Stairs: Yes: External: 3 steps; can reach both, can live on the ground floor  Has following equipment at home: Single point cane, Walker - 2 wheeled, and Grab bars  PLOF: Independent  PATIENT GOALS: Wants to not be dizzy, improve stability.   OBJECTIVE:   DIAGNOSTIC FINDINGS: MRI brain 11/04/22:  IMPRESSION: 1. No acute intracranial abnormality. 2. Multiple old small vessel infarcts of the deep gray nuclei and findings of chronic small vessel ischemia. 3. Chronic left pontine microhemorrhage.  CT angio head/neck 01/25/2022 IMPRESSION: 1.  No intracranial large vessel occlusion or significant stenosis. 2.  No hemodynamically significant stenosis in the neck. 3. Incidental note is made of an aberrant origin of the right subclavian artery.   GAIT: Gait pattern: step through pattern, trunk flexed, and wide BOS Distance walked: Clinic distances back to tx room  Assistive device utilized: Single point cane Level of assistance: SBA Comments: Into and out of tx room   VESTIBULAR ASSESSMENT:   POSITIONAL TESTING: Right Roll Test: apogeotropic nystagmus and initially lasting approx. 60 seconds. After performing Gufoni maneuver x2 reps, pt with no nystagmus with R roll test and no dizziness.  Left Roll Test: apogeotropic nystagmus, symptoms worse on left side, and very mild lasting <15-20 seconds. Pt reporting mild dizziness initially in this position. After 3 reps of Gufoni  maneuver,   Initially symptoms worse on R side, then pt reporting symptoms and dizziness were worse going to the L.    VESTIBULAR TREATMENT:                                                                                                   DATE: 12/13/22  Canalith Repositioning:  Appiani/Gufoni Horizontal Apogeotropic: Number of Reps: 3, Response to Treatment: symptoms improved, and Comment: First rep performed to L side(to treat affected R side). After re-assessment, no change noted this way and pt more symptomatic with rolling to the L. 2nd rep performed to R side (due to pt more symptomatic on L side when rolling). Pt reporting a little dizzy coming up, but it went away quickly after first rep. Pt more symptomatic coming up after the 2nd rep.      After each re-assessment of roll test  after performing Gufoni to R side, pt not having any nystagmus or dizziness with R roll test and having mild nystagmus/dizziness with roll test to L. Pt reporting improvement in dizziness rolling to L and coming to upright after each rep. Nystagmus more mild and only lasting approx. 10-15 seocnds.      PATIENT EDUCATION: Education details: Clinical findings, Continue to perform repeated rolling x5 reps to each side approx. 3 times daily to see if that helps clear up remaining horizontal canal BPPV  Person educated: Patient and   Education method: Explanation and Demonstration Education comprehension: verbalized understanding and returned demonstration  HOME EXERCISE PROGRAM: Will provide at a future session   GOALS: Goals reviewed with patient? Yes  SHORT TERM GOALS: ALL STGS= LTGS   LONG TERM GOALS: Target date: 01/04/2023  Pt will be independent with final HEP for vestibular deficits in order to build upon functional gains made in therapy. Baseline:  Goal status: INITIAL  2.  Pt will demonstrate negative positional testing in order to demo resolution of BPPV to decr dizziness.  Baseline:  Goal  status: INITIAL  3.  Pt will perform items on MSQ with a 1/5 rating or less in order to demo improved dizziness.  Baseline:  Goal status: INITIAL  4.  Pt will undergo discharge FOTO.  Baseline:  Goal status: INITIAL  5.  Further balance testing to be assessed such as mCTSIB as needed.  Baseline:  Goal status: INITIAL    ASSESSMENT:  CLINICAL IMPRESSION: Pt reporting feeling better since he was last here, but still having dizziness when coming to sit upright in the morning. Re-assessed at the beginning of session with pt with ~60 seconds of apogeotropic nystagmus on R roll test, and approx 10-15 seconds going to L side. Pt initially having more symptoms going to the R side and treated with the appropriate Gufoni maneuver. Afterwards, pt then more symptomatic going to the L side (treated with appropriate Gufoni maneuver for more affected side). After 2 reps of Gufoni maneuver with treating R side as the affected side (side of less symptoms being the affected side), with re-assessment pt with no dizziness or nystagmus with R roll test, but only had nystagmus and mild dizziness when rolling to L. Pt reporting after each rep, pt had less symptoms with rolling to the L and when coming upright. Educated to continue to work on repeated rolling at home as HEP to hopefully clear BPPV. Will continue per POC.    OBJECTIVE IMPAIRMENTS: Abnormal gait, decreased activity tolerance, decreased balance, difficulty walking, dizziness, and postural dysfunction.   ACTIVITY LIMITATIONS: standing, stairs, bed mobility, reach over head, and locomotion level  PARTICIPATION LIMITATIONS: driving and community activity  PERSONAL FACTORS: Age, Behavior pattern, Past/current experiences, Time since onset of injury/illness/exacerbation, and 3+ comorbidities:  hypertension, hyperlipidemia, diabetes, diabetic neuropathy, chronic calcific pancreatitis, metastatic melanoma, chronic kidney disease, hx of alcohol dependence,  hx of brain surgery 2015, history of right thalamic and left pontine lacunar strokes in February 2023  are also affecting patient's functional outcome.   REHAB POTENTIAL: Good  CLINICAL DECISION MAKING: Evolving/moderate complexity  EVALUATION COMPLEXITY: Moderate   PLAN:  PT FREQUENCY: 2x/week  PT DURATION: 4 weeks  PLANNED INTERVENTIONS: Therapeutic exercises, Therapeutic activity, Neuromuscular re-education, Balance training, Gait training, Patient/Family education, Self Care, Vestibular training, Canalith repositioning, and Re-evaluation  PLAN FOR NEXT SESSION: Re-assess horizontal canals. Treat as needed. Give any further exercises for balance/gaze stabilization.    Arliss Journey, PT, DPT  12/13/2022,  4:24 PM  Check all possible CPT codes: 16109 - PT Re-evaluation, 97110- Therapeutic Exercise, [phone]- Neuro Re-education, [phone] - Gait Training, [phone] - Manual Therapy, [phone] - Therapeutic Activities, [phone] - Self Care, and [phone] - Canalith Repositioning    Check all conditions that are expected to impact treatment: Diabetes mellitus and Neurological condition   If treatment provided at initial evaluation, no treatment charged due to lack of authorization.

## 2022-12-14 ENCOUNTER — Encounter: Payer: Self-pay | Admitting: Physical Therapy

## 2022-12-18 ENCOUNTER — Encounter: Payer: Self-pay | Admitting: Physical Therapy

## 2022-12-18 ENCOUNTER — Ambulatory Visit: Payer: Medicaid Other | Admitting: Physical Therapy

## 2022-12-18 DIAGNOSIS — H8111 Benign paroxysmal vertigo, right ear: Secondary | ICD-10-CM

## 2022-12-18 DIAGNOSIS — R42 Dizziness and giddiness: Secondary | ICD-10-CM | POA: Diagnosis not present

## 2022-12-18 NOTE — Therapy (Signed)
OUTPATIENT PHYSICAL THERAPY VESTIBULAR TREATMENT     Patient Name: Kenneth [name] MRN: [id number] DOB:1961-06-01, 62 y.o., male Today's Date: 12/18/2022  END OF SESSION:  PT End of Session - 12/18/22 0935     Visit Number 4    Number of Visits 9    Date for PT Re-Evaluation 01/06/23    Authorization Type [employer] Medicaid    PT Start Time [phone]    PT Stop Time 1005    PT Time Calculation (min) 31 min    Activity Tolerance Patient tolerated treatment well    Behavior During Therapy [facility] for tasks assessed/performed             Past Medical History:  Diagnosis Date   Alcohol dependence ([place])    Chronic calcific pancreatitis ([place])    CVA (cerebral vascular accident) ([place]) 01/24/2022   Diabetes mellitus without complication ([place])    Hyperlipidemia    Hypertension    Melanoma ([place]) 2013   metastatic - resected and cured with chemo/immuno tx   Pancreatic cyst-mass?    Polyneuropathy    Past Surgical History:  Procedure Laterality Date   BIOPSY  02/03/2022   Procedure: BIOPSY;  Surgeon: Gatha Mayer, MD;  Location: [facility] ENDOSCOPY;  Service: Gastroenterology;;   BRAIN SURGERY  2015   to check for possible malignancy from melanoma, result were scar tissue   COLONOSCOPY WITH PROPOFOL N/A 02/03/2022   Procedure: COLONOSCOPY WITH PROPOFOL;  Surgeon: Gatha Mayer, MD;  Location: [facility] ENDOSCOPY;  Service: Gastroenterology;  Laterality: N/A;   ESOPHAGOGASTRODUODENOSCOPY (EGD) WITH PROPOFOL N/A 02/03/2022   Procedure: ESOPHAGOGASTRODUODENOSCOPY (EGD) WITH PROPOFOL;  Surgeon: Gatha Mayer, MD;  Location: [place];  Service: Gastroenterology;  Laterality: N/A;   melanoma exicison     right leg   Patient Active Problem List   Diagnosis Date Noted   Seborrhea 11/07/2022   History of melanoma 04/12/2022   Alcohol dependence in remission ([place]) 04/12/2022   Aortic atherosclerosis ([place]) 04/12/2022   Alcohol-induced chronic pancreatitis ([place]) 03/15/2022   History of CVA  (cerebrovascular accident) 02/28/2022   Protein-calorie malnutrition, severe 02/03/2022   Gastritis and gastroduodenitis    Normocytic anemia 02/02/2022   Occult GI bleeding 02/01/2022   DNR (do not resuscitate) 02/01/2022   Vertigo 01/24/2022   ARF (acute renal failure) ([place]) 01/24/2022   Elevated PSA 11/24/2021   Hypokalemia 11/24/2021   History of immunotherapy 11/25/2019   Hx of colonic polyps 03/28/2017   Melanoma metastatic to brain ([place]) 08/08/2016   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 - says he did take Meclizine this morning- states he didn't realize he was not to take it prior to PT appt. - states "I just took the handful of medicine my sister gave me"; reports he has been doing the rolling at home but does not get very dizzy when he does this   Pt accompanied by: family member sister [name] (in waiting room)  PERTINENT HISTORY: He has past medical history of hypertension,  hyperlipidemia, diabetes, diabetic neuropathy, chronic calcific pancreatitis, metastatic melanoma, chronic kidney disease, hx of alcohol dependence, hx of brain surgery 2015, history of right thalamic and left pontine lacunar strokes in February 2023.  He was seen in the ER on 11/03/2022. CT scan of the head was unremarkable. MRI was also obtained which showed no acute abnormality. Old lacunar infarcts are noted. He does have prior  history of right thalamic and left pontine lacunar strokes in February 2023.  He states that the dizziness has apparently been he woke up 1 day and noticed when he woke up with sudden weight he had sensation of room spinning and felt slightly nauseous. He did not throw up. Dizziness was mostly positional and transient and improved he moves slowly.  PAIN:  Are you having pain? No  There were no vitals filed for this visit.    PRECAUTIONS: None  WEIGHT BEARING RESTRICTIONS: No  FALLS: Has patient fallen in last 6 months? No  LIVING ENVIRONMENT: Lives with:  lives with sister  Lives in: House/apartment Stairs: Yes: External: 3 steps; can reach both, can live on the ground floor  Has following equipment at home: Single point cane, Walker - 2 wheeled, and Grab bars  PLOF: Independent  PATIENT GOALS: Wants to not be dizzy, improve stability.   OBJECTIVE:   DIAGNOSTIC FINDINGS: MRI brain 11/04/22:  IMPRESSION: 1. No acute intracranial abnormality. 2. Multiple old small vessel infarcts of the deep gray nuclei and findings of chronic small vessel ischemia. 3. Chronic left pontine microhemorrhage.  CT angio head/neck 01/25/2022 IMPRESSION: 1.  No intracranial large vessel occlusion or significant stenosis. 2.  No hemodynamically significant stenosis in the neck. 3. Incidental note is made of an aberrant origin of the right subclavian artery.   GAIT: Gait pattern: step through pattern, trunk flexed, and wide BOS Distance walked: Clinic distances back to tx room  Assistive device utilized: Single point cane Level of assistance: SBA Comments: Into and out of tx room   VESTIBULAR ASSESSMENT:   POSITIONAL TESTING: Rt horizontal roll test (-) with no nystagmus and no c/o vertigo:  Lt horizontal roll test (-) with no nystagmus and no c/o vertigo  Rt sidelying test - pt reported min vertigo but had ageotropic horizontal nystagmus of mild intensity Lt sidelying test - pt had Rt  horizontal nystagmus but reported very minimal to no vertigo in this position  Rt Dix-Hallpike test- (+) with Rt rotary upbeating nystagmus - duration approx. 10 secs - pt reported mod. Vertigo Lt Dix-Hallpike test  (-) with no nystagmus and no c/o vertigo in test position   VESTIBULAR TREATMENT:                                                                                                   DATE: 12/18/22  Canalith Repositioning:  Epley maneuver for Rt posterior canalithiasis 2 reps; symptoms significantly improved on 2nd rep; pt continues to c/o light-headedness with sidelying to sitting transfer which appears to be due to change in BP - pt does not describe it as room spinning vertigo    PATIENT  EDUCATION: Education details: Requested that pt not take Meclizine 24 hours prior to next scheduled PT appt to accurately assess BPPV - pt verbalized understanding and agreement  Person educated: Patient and   Education method: Customer service manager Education comprehension: verbalized understanding and returned demonstration  HOME EXERCISE PROGRAM: Will provide at a future session   GOALS: Goals reviewed with patient? Yes  SHORT TERM GOALS: ALL STGS= LTGS   LONG TERM GOALS: Target date: 01/04/2023  Pt will be independent with final HEP for vestibular deficits in order to build upon functional gains made in therapy. Baseline:  Goal status: INITIAL  2.  Pt will demonstrate negative positional testing in order to demo resolution of BPPV to decr dizziness.  Baseline:  Goal status: INITIAL  3.  Pt will perform items on MSQ with a 1/5 rating or less in order to demo improved dizziness.  Baseline:  Goal status: INITIAL  4.  Pt will undergo discharge FOTO.  Baseline:  Goal status: INITIAL  5.  Further balance testing to be assessed such as mCTSIB as needed.  Baseline:  Goal status: INITIAL    ASSESSMENT:  CLINICAL IMPRESSION: Pt had (+) Rt Dix-Hallpike test with Rt  rotary upbeating nystagmus and c/o spinning vertigo in test position.  Pt was treated with 2 reps of Epley and symptoms were significantly improved on 2nd rep.  Pt had (-) Rt and Lt horizontal roll test in today's session.  Will continue per POC.    OBJECTIVE IMPAIRMENTS: Abnormal gait, decreased activity tolerance, decreased balance, difficulty walking, dizziness, and postural dysfunction.   ACTIVITY LIMITATIONS: standing, stairs, bed mobility, reach over head, and locomotion level  PARTICIPATION LIMITATIONS: driving and community activity  PERSONAL FACTORS: Age, Behavior pattern, Past/current experiences, Time since onset of injury/illness/exacerbation, and 3+ comorbidities:  hypertension, hyperlipidemia, diabetes, diabetic neuropathy, chronic calcific pancreatitis, metastatic melanoma, chronic kidney disease, hx of alcohol dependence, hx of brain surgery 2015, history of right thalamic and left pontine lacunar strokes in February 2023  are also affecting patient's functional outcome.   REHAB POTENTIAL: Good  CLINICAL DECISION MAKING: Evolving/moderate complexity  EVALUATION COMPLEXITY: Moderate   PLAN:  PT FREQUENCY: 2x/week  PT DURATION: 4 weeks  PLANNED INTERVENTIONS: Therapeutic exercises, Therapeutic activity, Neuromuscular re-education, Balance training, Gait training, Patient/Family education, Self Care, Vestibular training, Canalith repositioning, and Re-evaluation  PLAN FOR NEXT SESSION: Re-assess Rt posterior canal BPPV; Give any further exercises for balance/gaze stabilization.    Alda Lea, PT 12/18/2022, 5:36 PM  Check all possible CPT codes: [phone] - PT Re-evaluation, 97110- Therapeutic Exercise, [phone]- Neuro Re-education, [phone] - Gait Training, [phone] - Manual Therapy, [phone] - Therapeutic Activities, [phone] - Self Care, and [phone] - Canalith Repositioning    Check all conditions that are expected to impact treatment: Diabetes mellitus and Neurological  condition   If treatment provided at initial evaluation, no treatment charged due to lack of authorization.

## 2022-12-19 ENCOUNTER — Ambulatory Visit: Payer: Medicaid Other | Admitting: Physical Therapy

## 2022-12-21 ENCOUNTER — Encounter: Payer: Medicaid Other | Admitting: Physical Therapy

## 2022-12-25 ENCOUNTER — Ambulatory Visit: Payer: Medicaid Other | Admitting: Physical Therapy

## 2022-12-25 DIAGNOSIS — R42 Dizziness and giddiness: Secondary | ICD-10-CM | POA: Diagnosis not present

## 2022-12-25 DIAGNOSIS — H8111 Benign paroxysmal vertigo, right ear: Secondary | ICD-10-CM

## 2022-12-25 NOTE — Therapy (Signed)
OUTPATIENT PHYSICAL THERAPY VESTIBULAR TREATMENT/DISCHARGE SUMMARY     Patient Name: Kenneth [name] MRN: [id number] DOB:1961/10/20, 62 y.o., male Today's Date: 12/26/2022  END OF SESSION:  PT End of Session - 12/26/22 1951     Visit Number 5    Number of Visits 9    Date for PT Re-Evaluation 01/06/23    Authorization Type UHC Medicaid    PT Start Time 0932    PT Stop Time 3557    PT Time Calculation (min) 42 min    Activity Tolerance Patient tolerated treatment well    Behavior During Therapy [facility] for tasks assessed/performed              Past Medical History:  Diagnosis Date   Alcohol dependence ([place])    Chronic calcific pancreatitis ([place])    CVA (cerebral vascular accident) ([place]) 01/24/2022   Diabetes mellitus without complication ([place])    Hyperlipidemia    Hypertension    Melanoma ([place]) 2013   metastatic - resected and cured with chemo/immuno tx   Pancreatic cyst-mass?    Polyneuropathy    Past Surgical History:  Procedure Laterality Date   BIOPSY  02/03/2022   Procedure: BIOPSY;  Surgeon: Gatha Mayer, MD;  Location: [facility] ENDOSCOPY;  Service: Gastroenterology;;   BRAIN SURGERY  2015   to check for possible malignancy from melanoma, result were scar tissue   COLONOSCOPY WITH PROPOFOL N/A 02/03/2022   Procedure: COLONOSCOPY WITH PROPOFOL;  Surgeon: Gatha Mayer, MD;  Location: [facility] ENDOSCOPY;  Service: Gastroenterology;  Laterality: N/A;   ESOPHAGOGASTRODUODENOSCOPY (EGD) WITH PROPOFOL N/A 02/03/2022   Procedure: ESOPHAGOGASTRODUODENOSCOPY (EGD) WITH PROPOFOL;  Surgeon: Gatha Mayer, MD;  Location: [place];  Service: Gastroenterology;  Laterality: N/A;   melanoma exicison     right leg   Patient Active Problem List   Diagnosis Date Noted   Seborrhea 11/07/2022   History of melanoma 04/12/2022   Alcohol dependence in remission ([place]) 04/12/2022   Aortic atherosclerosis ([place]) 04/12/2022   Alcohol-induced chronic pancreatitis ([place]) 03/15/2022   History  of CVA (cerebrovascular accident) 02/28/2022   Protein-calorie malnutrition, severe 02/03/2022   Gastritis and gastroduodenitis    Normocytic anemia 02/02/2022   Occult GI bleeding 02/01/2022   DNR (do not resuscitate) 02/01/2022   Vertigo 01/24/2022   ARF (acute renal failure) ([place]) 01/24/2022   Elevated PSA 11/24/2021   Hypokalemia 11/24/2021   History of immunotherapy 11/25/2019   Hx of colonic polyps 03/28/2017   Melanoma metastatic to brain ([place]) 08/08/2016   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[place]) 08/13/2014   Metastatic melanoma to lung ([place]) 12/12/2012   Type 2 diabetes mellitus ([place]) 08/20/2007   Dyslipidemia 08/20/2007   Essential hypertension 08/19/2007    PCP: Haydee Salter, MD  REFERRING PROVIDER: Garvin Fila, MD  REFERRING DIAG: R42 (ICD-10-CM) - Vertigo H81.10 (ICD-10-CM) - Benign paroxysmal positional vertigo, unspecified laterality  THERAPY DIAG:  BPPV (benign paroxysmal positional vertigo), right  ONSET DATE: 11/30/2022  Rationale for Evaluation and Treatment: Rehabilitation  SUBJECTIVE:   SUBJECTIVE STATEMENT:  Pt reports vertigo is much improved from what it was last week - states he did have some dizziness yesterday when his sister's dog jumped up on him and he tilted his head back - states it only lasted a few seconds and then subsided    Pt accompanied by: family member sister [name] (in waiting room)  PERTINENT HISTORY: He has past medical history of hypertension, hyperlipidemia, diabetes, diabetic neuropathy, chronic calcific pancreatitis, metastatic melanoma, chronic kidney disease,  hx of alcohol dependence, hx of brain surgery 2015, history of right thalamic and left pontine lacunar strokes in February 2023.  He was seen in the ER on 11/03/2022. CT scan of the head was unremarkable. MRI was also obtained which showed no acute abnormality. Old lacunar infarcts are noted. He does have prior history of right thalamic and left pontine lacunar strokes in  February 2023.  He states that the dizziness has apparently been he woke up 1 day and noticed when he woke up with sudden weight he had sensation of room spinning and felt slightly nauseous. He did not throw up. Dizziness was mostly positional and transient and improved he moves slowly.  PAIN:  Are you having pain? No  There were no vitals filed for this visit.    PRECAUTIONS: None  WEIGHT BEARING RESTRICTIONS: No  FALLS: Has patient fallen in last 6 months? No  LIVING ENVIRONMENT: Lives with:  lives with sister  Lives in: House/apartment Stairs: Yes: External: 3 steps; can reach both, can live on the ground floor  Has following equipment at home: Single point cane, Walker - 2 wheeled, and Grab bars  PLOF: Independent  PATIENT GOALS: Wants to not be dizzy, improve stability.   OBJECTIVE:   DIAGNOSTIC FINDINGS: MRI brain 11/04/22:  IMPRESSION: 1. No acute intracranial abnormality. 2. Multiple old small vessel infarcts of the deep gray nuclei and findings of chronic small vessel ischemia. 3. Chronic left pontine microhemorrhage.  CT angio head/neck 01/25/2022 IMPRESSION: 1.  No intracranial large vessel occlusion or significant stenosis. 2.  No hemodynamically significant stenosis in the neck. 3. Incidental note is made of an aberrant origin of the right subclavian artery.   GAIT: Gait pattern: step through pattern, trunk flexed, and wide BOS Distance walked: Clinic distances back to tx room  Assistive device utilized: Single point cane Level of assistance: SBA Comments: Into and out of tx room   VESTIBULAR ASSESSMENT:   POSITIONAL TESTING: Rt horizontal roll test (-) with no nystagmus and no c/o vertigo:  Lt horizontal roll test (-) with no nystagmus and no c/o vertigo   Rt Dix-Hallpike test- (+) with Rt rotary upbeating nystagmus - duration approx. 8 secs - pt reported mod. Vertigo Lt Dix-Hallpike test  (-) with no nystagmus and no c/o vertigo in test  position  VESTIBULAR TREATMENT:                                                                                                   DATE: 12/25/22  Canalith Repositioning:  Epley maneuver for Rt posterior canalithiasis 3 reps; symptoms significantly improved on 3rd rep; pt continues to c/o light-headedness with sidelying to sitting transfer which appears to be due to change in BP - pt does not describe it as room spinning vertigo    PATIENT EDUCATION: Education details: Requested that pt not take Meclizine 24 hours prior to next scheduled PT appt to accurately assess BPPV - pt verbalized understanding and agreement  Person educated: Patient and   Education method: Explanation and Demonstration Education comprehension: verbalized understanding and returned demonstration  HOME  EXERCISE PROGRAM: Will provide at a future session   GOALS: Goals reviewed with patient? Yes  SHORT TERM GOALS: ALL STGS= LTGS   LONG TERM GOALS: Target date: 01/04/2023  Pt will be independent with final HEP for vestibular deficits in order to build upon functional gains made in therapy. Baseline:  Goal status: Goal met 12-25-22  2.  Pt will demonstrate negative positional testing in order to demo resolution of BPPV to decr dizziness.  Baseline:  Goal status: Partially met 12-25-22  3.  Pt will perform items on MSQ with a 1/5 rating or less in order to demo improved dizziness.  Baseline:  Goal status: Goal met 12-25-22  4.  Pt will undergo discharge FOTO.  Baseline: score 59/100 Goal status: Goal met 12-25-22  5.  Further balance testing to be assessed such as mCTSIB as needed.  Baseline:  Goal status: Deferred per pt's request    ASSESSMENT:  CLINICAL IMPRESSION: Pt had (+) Rt Dix-Hallpike test with Rt rotary upbeating nystagmus and c/o spinning vertigo in test position.  Pt was treated with 3 reps of Epley and symptoms were significantly improved on 3rd rep.  Pt reports dizziness is much improved in  today's session and states that he feels he is ready for D/C due to satisfied with current functional level.  Pt has met LTG's #1,3 and 4: LTG #2 partially met as pt continued to have (+) Rt Dix-Hallpike test in today's session but symptoms resolved after completion of 3rd rep of Epley maneuver.  LTG #5 deferred per pt's request.  D/C due to goals met and also due to pt being satisfied with current functional status with minimal, infrequent occurrences of dizziness.    OBJECTIVE IMPAIRMENTS: Abnormal gait, decreased activity tolerance, decreased balance, difficulty walking, dizziness, and postural dysfunction.   ACTIVITY LIMITATIONS: standing, stairs, bed mobility, reach over head, and locomotion level  PARTICIPATION LIMITATIONS: driving and community activity  PERSONAL FACTORS: Age, Behavior pattern, Past/current experiences, Time since onset of injury/illness/exacerbation, and 3+ comorbidities:  hypertension, hyperlipidemia, diabetes, diabetic neuropathy, chronic calcific pancreatitis, metastatic melanoma, chronic kidney disease, hx of alcohol dependence, hx of brain surgery 2015, history of right thalamic and left pontine lacunar strokes in February 2023  are also affecting patient's functional outcome.   REHAB POTENTIAL: Good  CLINICAL DECISION MAKING: Evolving/moderate complexity  EVALUATION COMPLEXITY: Moderate   PLAN:  PT FREQUENCY: 2x/week  PT DURATION: 4 weeks  PLANNED INTERVENTIONS: Therapeutic exercises, Therapeutic activity, Neuromuscular re-education, Balance training, Gait training, Patient/Family education, Self Care, Vestibular training, Canalith repositioning, and Re-evaluation  PLAN FOR NEXT SESSION: N/A  - D/C 12-25-22    PHYSICAL THERAPY DISCHARGE SUMMARY  Visits from Start of Care: 5  Current functional level related to goals / functional outcomes: See above for progress towards goals - dizziness mostly resolved at this time   Remaining deficits: Continued  c/o dizziness with specific movements but pt reports overall significant improvement in dizziness and balance   Education / Equipment: Pt has been instructed in HEP for habituation exercises   Patient agrees to discharge. Patient goals were partially met. Patient is being discharged due to being pleased with the current functional level. Pt reports dizziness is mostly resolved  this time.    Alda Lea, PT 12/26/2022, 8:06 PM  Check all possible CPT codes: 97164 - PT Re-evaluation, 97110- Therapeutic Exercise, [phone]- Neuro Re-education, [phone] - Gait Training, [phone] - Manual Therapy, [place] - Therapeutic Activities, [phone] - Self Care, and [phone] - Canalith Repositioning  Check all conditions that are expected to impact treatment: Diabetes mellitus and Neurological condition     If treatment provided at initial evaluation, no treatment charged due to lack of authorization.

## 2022-12-26 ENCOUNTER — Ambulatory Visit: Payer: Medicaid Other | Admitting: Physical Therapy

## 2022-12-26 ENCOUNTER — Encounter: Payer: Self-pay | Admitting: Physical Therapy

## 2022-12-28 ENCOUNTER — Encounter: Payer: Medicaid Other | Admitting: Physical Therapy

## 2023-02-06 ENCOUNTER — Ambulatory Visit (INDEPENDENT_AMBULATORY_CARE_PROVIDER_SITE_OTHER): Payer: Medicaid Other | Admitting: Family Medicine

## 2023-02-06 ENCOUNTER — Encounter: Payer: Self-pay | Admitting: Family Medicine

## 2023-02-06 VITALS — BP 140/82 | HR 49 | Temp 97.6°F | Ht 64.0 in | Wt 139.2 lb

## 2023-02-06 DIAGNOSIS — I10 Essential (primary) hypertension: Secondary | ICD-10-CM | POA: Diagnosis not present

## 2023-02-06 DIAGNOSIS — E785 Hyperlipidemia, unspecified: Secondary | ICD-10-CM | POA: Diagnosis not present

## 2023-02-06 DIAGNOSIS — H8110 Benign paroxysmal vertigo, unspecified ear: Secondary | ICD-10-CM | POA: Insufficient documentation

## 2023-02-06 DIAGNOSIS — E119 Type 2 diabetes mellitus without complications: Secondary | ICD-10-CM | POA: Diagnosis not present

## 2023-02-06 DIAGNOSIS — F1021 Alcohol dependence, in remission: Secondary | ICD-10-CM | POA: Diagnosis not present

## 2023-02-06 NOTE — Assessment & Plan Note (Signed)
Blood pressure is high today, but Kenneth [name] did not take his BP meds this morning. I asked that he try and remember to take his meds with water prior to coming in for his appointments.

## 2023-02-06 NOTE — Assessment & Plan Note (Signed)
Continue atorvastatin 40 mg daily.  

## 2023-02-06 NOTE — Assessment & Plan Note (Signed)
Last A1c was elevated. Daggett should continue his metformin 1,000 mg bid and add the pioglitazone 15 mg daily. I reminded him of the importance of having his dilated eye exam each year.

## 2023-02-06 NOTE — Addendum Note (Signed)
Addended by: Beryle Lathe S on: 02/06/2023 01:09 PM   Modules accepted: Orders

## 2023-02-06 NOTE — Progress Notes (Signed)
[facility] [address] [place] [address] Dept: [phone] Dept Fax: [phone]  Chronic Care Office Visit  Subjective:    Patient ID: BRONTE DEWATER, male    DOB: 1961-10-26, 62 y.o..   MRN: [id number]  Chief Complaint  Patient presents with   Medical Management of Chronic Issues    3 month f/u.   No concerns.     History of Present Illness:  Patient is in today for reassessment of chronic medical issues.  Mr. Dirienzo has a history of alcoholism. He remains in sobriety since Feb. 2023. He lives with his sister and this arrangement seems to be helping him.   Mr. Sohl has a history of an elevated PSA. He is on Flomax 0.4 mg daily for LUTS.   Mr. Pink has a history of Type 2 diabetes. He is managed on metformin 1,000 mg bid. After his last visit, I had prescribed pioglitazone 15 mg daily. He did not get this picked up. His sister, who is with him today, is ordering a refill and will make sure he gets this added to his regimen.   Mr. Rupani has a history of hypertension. He is managed on amlodipine-benazepril 10-20 mg daily and metoprolol tartrate 100 mg bid.   Mr. Vilchez has dyslipidemia. He is managed on atorvastatin 20 mg daily.  Past Medical History: Patient Active Problem List   Diagnosis Date Noted   BPV (benign positional vertigo) 02/06/2023   Seborrhea 11/07/2022   History of melanoma 04/12/2022   Alcohol dependence in remission ([place]) 04/12/2022   Aortic atherosclerosis ([place]) 04/12/2022   Alcohol-induced chronic pancreatitis ([place]) 03/15/2022   History of CVA (cerebrovascular accident) 02/28/2022   Protein-calorie malnutrition, severe 02/03/2022   Gastritis and gastroduodenitis    Normocytic anemia 02/02/2022   Occult GI bleeding 02/01/2022   DNR (do not resuscitate) 02/01/2022   Vertigo 01/24/2022   ARF (acute renal failure) ([place]) 01/24/2022   Elevated PSA 11/24/2021   Hypokalemia  11/24/2021   History of immunotherapy 11/25/2019   Hx of colonic polyps 03/28/2017   Melanoma metastatic to brain ([place]) 08/08/2016   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[place]) 08/13/2014   Metastatic melanoma to lung ([place]) 12/12/2012   Type 2 diabetes mellitus ([place]) 08/20/2007   Dyslipidemia 08/20/2007   Essential hypertension 08/19/2007   Past Surgical History:  Procedure Laterality Date   BIOPSY  02/03/2022   Procedure: BIOPSY;  Surgeon: Gatha Mayer, MD;  Location: [place];  Service: Gastroenterology;;   BRAIN SURGERY  2015   to check for possible malignancy from melanoma, result were scar tissue   COLONOSCOPY WITH PROPOFOL N/A 02/03/2022   Procedure: COLONOSCOPY WITH PROPOFOL;  Surgeon: Gatha Mayer, MD;  Location: [place];  Service: Gastroenterology;  Laterality: N/A;   ESOPHAGOGASTRODUODENOSCOPY (EGD) WITH PROPOFOL N/A 02/03/2022   Procedure: ESOPHAGOGASTRODUODENOSCOPY (EGD) WITH PROPOFOL;  Surgeon: Gatha Mayer, MD;  Location: [place];  Service: Gastroenterology;  Laterality: N/A;   melanoma exicison     right leg   Family History  Problem Relation Age of Onset   Heart disease Father    Prostate cancer Father    Cancer Maternal Grandmother        Brain   Stomach cancer Paternal Grandmother    Colon cancer Neg Hx    Esophageal cancer Neg Hx    Rectal cancer Neg Hx    Outpatient Medications Prior to Visit  Medication Sig Dispense Refill   amLODipine-benazepril (LOTREL) 10-20 MG capsule Take 1  capsule by mouth daily. 90 capsule 3   aspirin EC 81 MG EC tablet Take 1 tablet (81 mg total) by mouth daily. Swallow whole. 30 tablet 11   atorvastatin (LIPITOR) 20 MG tablet TAKE 1 TABLET BY MOUTH DAILY 90 tablet 3   lactobacillus acidophilus (BACID) TABS tablet Take 1 tablet by mouth daily. Align     magnesium oxide (MAG-OX) 400 (240 Mg) MG tablet Take 1 tablet (400 mg total) by mouth 2 (two) times daily.     meclizine (ANTIVERT) 25 MG tablet Take 1 tablet (25 mg total) by mouth  3 (three) times daily as needed for dizziness. 30 tablet 0   metFORMIN (GLUCOPHAGE) 1000 MG tablet TAKE 1 TABLET BY MOUTH TWICE DAILY 180 tablet 3   metoprolol tartrate (LOPRESSOR) 100 MG tablet TAKE 1 TABLET BY MOUTH TWICE DAILY 180 tablet 3   mirtazapine (REMERON) 7.5 MG tablet Take 1 tablet (7.5 mg total) by mouth at bedtime. 90 tablet 3   Multiple Vitamin (MULTIVITAMIN WITH MINERALS) TABS tablet Take 1 tablet by mouth daily.     pioglitazone (ACTOS) 30 MG tablet Take 1 tablet (30 mg total) by mouth daily. 30 tablet 5   tamsulosin (FLOMAX) 0.4 MG CAPS capsule Take 0.4 mg by mouth daily.     triamcinolone cream (KENALOG) 0.1 % Apply small amount topically to areas of scaly rash on face and neck once a day as needed. 30 g 5   feeding supplement (ENSURE ENLIVE / ENSURE PLUS) LIQD Take 237 mLs by mouth 3 (three) times daily between meals. 237 mL 12   No facility-administered medications prior to visit.   Allergies  Allergen Reactions   Cat Hair Extract Shortness Of Breath and Swelling    Swelling, watery eyes   Objective:   Today's Vitals   02/06/23 0850  BP: (!) 140/82  Pulse: (!) 49  Temp: 97.6 F (36.4 C)  TempSrc: Temporal  SpO2: 98%  Weight: 139 lb 3.2 oz (63.1 kg)  Height: '5\' 4"'$  (1.626 m)   Body mass index is 23.89 kg/m.   General: Well developed, well nourished. No acute distress. Psych: Alert and oriented. Normal mood and affect.  Health Maintenance Due  Topic Date Due   OPHTHALMOLOGY EXAM  Never done   Zoster Vaccines- Shingrix (1 of 2) Never done     Assessment & Plan:   Problem List Items Addressed This Visit       Cardiovascular and Mediastinum   Essential hypertension - Primary    Blood pressure is high today, but Mr. Salyers did not take his BP meds this morning. I asked that he try and remember to take his meds with water prior to coming in for his appointments.        Endocrine   Type 2 diabetes mellitus (HCC)    Last A1c was elevated. [place]  should continue his metformin 1,000 mg bid and add the pioglitazone 15 mg daily. I reminded him of the importance of having his dilated eye exam each year.      Relevant Orders   Glucose, random   Hemoglobin A1c     Other   Dyslipidemia    Continue atorvastatin 40 mg daily.      Alcohol dependence in remission ([place])    Remains abstinent. I congratulated him on passing his 1 year anniversary.       Return in about 3 months (around 05/09/2023) for Reassessment.   Haydee Salter, MD

## 2023-02-06 NOTE — Assessment & Plan Note (Signed)
Remains abstinent. I congratulated him on passing his 1 year anniversary.

## 2023-03-30 ENCOUNTER — Other Ambulatory Visit: Payer: Self-pay | Admitting: Family Medicine

## 2023-03-30 DIAGNOSIS — R627 Adult failure to thrive: Secondary | ICD-10-CM

## 2023-04-10 ENCOUNTER — Telehealth: Payer: Self-pay | Admitting: Family Medicine

## 2023-04-10 DIAGNOSIS — E119 Type 2 diabetes mellitus without complications: Secondary | ICD-10-CM

## 2023-04-10 NOTE — Telephone Encounter (Signed)
Orders place for an A1C and glucose.  Appointment scheduled for 04/13/23 @ 10:20 am.   Patient's sister aware. Dm/cma

## 2023-04-10 NOTE — Telephone Encounter (Signed)
Pts sister called to make a lab appt for the pt but I did not see an active request for this.She said it was a redo from something not processed correctly. I did not schedule, please call sister [name] at [phone]

## 2023-04-13 ENCOUNTER — Other Ambulatory Visit (INDEPENDENT_AMBULATORY_CARE_PROVIDER_SITE_OTHER): Payer: Medicaid Other

## 2023-04-13 DIAGNOSIS — E119 Type 2 diabetes mellitus without complications: Secondary | ICD-10-CM | POA: Diagnosis not present

## 2023-04-13 LAB — HEMOGLOBIN A1C: Hgb A1c MFr Bld: 8 % — ABNORMAL HIGH (ref 4.6–6.5)

## 2023-04-13 LAB — GLUCOSE, RANDOM: Glucose, Bld: 119 mg/dL — ABNORMAL HIGH (ref 70–99)

## 2023-04-13 NOTE — Progress Notes (Signed)
1 lav and 1 gold drawn. Dm/cma

## 2023-05-09 ENCOUNTER — Ambulatory Visit: Payer: Medicaid Other | Admitting: Family Medicine

## 2023-05-09 ENCOUNTER — Telehealth: Payer: Self-pay | Admitting: Family Medicine

## 2023-05-09 NOTE — Telephone Encounter (Signed)
6.5.24 no show letter sent 

## 2023-05-10 NOTE — Telephone Encounter (Signed)
1st no show, fee waived, letter sent 

## 2023-05-29 ENCOUNTER — Other Ambulatory Visit: Payer: Self-pay | Admitting: Family Medicine

## 2023-05-30 ENCOUNTER — Encounter: Payer: Self-pay | Admitting: Adult Health

## 2023-05-30 ENCOUNTER — Ambulatory Visit (INDEPENDENT_AMBULATORY_CARE_PROVIDER_SITE_OTHER): Payer: Medicaid Other | Admitting: Adult Health

## 2023-05-30 VITALS — BP 154/84 | HR 69 | Ht 64.0 in | Wt 139.8 lb

## 2023-05-30 DIAGNOSIS — R42 Dizziness and giddiness: Secondary | ICD-10-CM

## 2023-05-30 DIAGNOSIS — Z8673 Personal history of transient ischemic attack (TIA), and cerebral infarction without residual deficits: Secondary | ICD-10-CM

## 2023-05-30 NOTE — Progress Notes (Signed)
PATIENT: Kenneth [name] DOB: Nov 01, 1961  REASON FOR VISIT: follow up HISTORY FROM: patient PRIMARY NEUROLOGIST: Dr. Pearlean Brownie  Chief Complaint  Patient presents with   Follow-up    Pt in 5 Pt here for vertigo f/u Pt states vertigo is a lot better Pt states therapy really helped     HISTORY OF PRESENT ILLNESS: Today 05/30/23:  Kenneth [name] is a 62 y.o. male with a history of vertigo. Returns today for follow-up.  Patient reports that vestibular rehab has been beneficial for his vertigo.  He denies any additional strokelike symptoms.  He remains on aspirin.  His PCP is monitoring lipids, blood pressure and diabetes.   11/30/22: Kenneth [name] is a 62 year old Caucasian male seen today for initial office consultation visit for vertigo.  He is accompanied by his sister.  History is obtained from them and review of electronic medical records.  I personally reviewed pertinent available imaging films in PACS.  He has past medical history of hypertension, hyperlipidemia, diabetes, diabetic neuropathy, chronic calcific pancreatitis, metastatic melanoma, chronic kidney disease.  He states that the dizziness has apparently been he woke up 1 day and noticed when he woke up with sudden weight he had sensation of room spinning and felt slightly nauseous.  He did not throw up.  Dizziness was mostly positional and transient and improved he moves slowly.  He was prescribed meclizine which initially took for 3 3 times daily which seemed to help and then after a week or so he stopped it.  However the dizziness came back but not as bad.  He is currently taking it twice daily and seems to help.  He was seen in the ER on 11/03/2022.  CT scan of the head was unremarkable.  MRI was also obtained which showed no acute abnormality.  Old lacunar infarcts are noted.  He does have prior history of right thalamic and left pontine lacunar strokes in February 2023.  At that time workup had shown CT angiogram.  Echocardiogram  showed EF 65%.  LDL cholesterol 47 and A1c 6.6.30-day heart monitor was negative for paroxysmal A-fib.  However his last lab work on 11/07/2022 showed elevated CRP 9.1 and LDL cholesterol 33 mg percent.  Patient has been on aspirin for stroke prevention and tolerating it well without bleeding or bruising.  He is also on Lipitor 20 mg and tolerating it well.  Well-controlled today it is 128/75.  He does have mild gait imbalance secondary to his diabetic neuropathy which appears unchanged.  He admits to ringing sound in his years but denies decreased hearing.  He denies any recurrent ear infections, ear trauma or major motorcycle accident.    03/03/22: 62 y.o. male with history of melanoma with brain and lung mets s/p chemo and gamma knife but was told cancer free 3 years ago, HTN, DM, HLD, alcohol abuse admitted for fall x 2 at home with imbalance, BLE weakness and numbness.  MRI showed acute right thalamic and subacute left paramedian pontine infarcts CTA head and neck unremarkable. 2D Echo EF 60-65 % LDL 47 hgbA1c 6.6 % Placed on aspirin 81 mg daily and Plavix 75 mg daily for 3 weeks then aspirin alone. But according to the patients sister he was never given a script for plavix they called PCP and she refused to give it ( per the patient/sister). Therefore patient never took plavix.  Currently staying with his sister. Denies any weakness. Has ongoing numbness in feet d/t neuropathy. Uses a walker to  ambulate- used a cane prior to hospitalization. No trouble chewing/swallowing food. No trouble with speech. Has not been contacted about cardiac monitor. No longer drinking ETOH. Non-smoker. Sister reports that he is getting better everyday.Currently in PT.   REVIEW OF SYSTEMS: Out of a complete 14 system review of symptoms, the patient complains only of the following symptoms, and all other reviewed systems are negative.  ALLERGIES: Allergies  Allergen Reactions   Cat Hair Extract Shortness Of Breath  and Swelling    Swelling, watery eyes    HOME MEDICATIONS: Outpatient Medications Prior to Visit  Medication Sig Dispense Refill   amLODipine-benazepril (LOTREL) 10-20 MG capsule Take 1 capsule by mouth daily. 90 capsule 3   aspirin EC 81 MG EC tablet Take 1 tablet (81 mg total) by mouth daily. Swallow whole. 30 tablet 11   atorvastatin (LIPITOR) 20 MG tablet TAKE 1 TABLET BY MOUTH DAILY 90 tablet 3   lactobacillus acidophilus (BACID) TABS tablet Take 1 tablet by mouth daily. Align     magnesium oxide (MAG-OX) 400 (240 Mg) MG tablet Take 1 tablet (400 mg total) by mouth 2 (two) times daily.     meclizine (ANTIVERT) 25 MG tablet Take 1 tablet (25 mg total) by mouth 3 (three) times daily as needed for dizziness. 30 tablet 0   metFORMIN (GLUCOPHAGE) 1000 MG tablet TAKE 1 TABLET BY MOUTH TWICE DAILY 180 tablet 3   metoprolol tartrate (LOPRESSOR) 100 MG tablet TAKE 1 TABLET BY MOUTH TWICE DAILY 180 tablet 0   mirtazapine (REMERON) 7.5 MG tablet TAKE 1 TABLET BY MOUTH DAILY AT BEDTIME 90 tablet 3   Multiple Vitamin (MULTIVITAMIN WITH MINERALS) TABS tablet Take 1 tablet by mouth daily.     pioglitazone (ACTOS) 30 MG tablet Take 1 tablet (30 mg total) by mouth daily. 30 tablet 5   tamsulosin (FLOMAX) 0.4 MG CAPS capsule Take 0.4 mg by mouth daily.     triamcinolone cream (KENALOG) 0.1 % Apply small amount topically to areas of scaly rash on face and neck once a day as needed. 30 g 5   No facility-administered medications prior to visit.    PAST MEDICAL HISTORY: Past Medical History:  Diagnosis Date   Alcohol dependence (HCC)    Chronic calcific pancreatitis (HCC)    CVA (cerebral vascular accident) (HCC) 01/24/2022   Diabetes mellitus without complication (HCC)    Hyperlipidemia    Hypertension    Melanoma (HCC) 2013   metastatic - resected and cured with chemo/immuno tx   Pancreatic cyst-mass?    Polyneuropathy     PAST SURGICAL HISTORY: Past Surgical History:  Procedure Laterality  Date   BIOPSY  02/03/2022   Procedure: BIOPSY;  Surgeon: Iva Boop, MD;  Location: [facility] ENDOSCOPY;  Service: Gastroenterology;;   BRAIN SURGERY  2015   to check for possible malignancy from melanoma, result were scar tissue   COLONOSCOPY WITH PROPOFOL N/A 02/03/2022   Procedure: COLONOSCOPY WITH PROPOFOL;  Surgeon: Iva Boop, MD;  Location: [facility] ENDOSCOPY;  Service: Gastroenterology;  Laterality: N/A;   ESOPHAGOGASTRODUODENOSCOPY (EGD) WITH PROPOFOL N/A 02/03/2022   Procedure: ESOPHAGOGASTRODUODENOSCOPY (EGD) WITH PROPOFOL;  Surgeon: Iva Boop, MD;  Location: [facility] ENDOSCOPY;  Service: Gastroenterology;  Laterality: N/A;   melanoma exicison     right leg    FAMILY HISTORY: Family History  Problem Relation Age of Onset   Heart disease Father    Prostate cancer Father    Cancer Maternal Grandmother  Brain   Stomach cancer Paternal Grandmother    Colon cancer Neg Hx    Esophageal cancer Neg Hx    Rectal cancer Neg Hx     SOCIAL HISTORY: Social History   Socioeconomic History   Marital status: Single    Spouse name: Not on file   Number of children: 0   Years of education: Not on file   Highest education level: Bachelor's degree (e.g., BA, AB, BS)  Occupational History   Occupation: Receptionist    Comment: [employer]  Tobacco Use   Smoking status: Never   Smokeless tobacco: Former    Types: Snuff  Vaping Use   Vaping Use: Never used  Substance and Sexual Activity   Alcohol use: Not Currently    Comment: Historyof alcohol abuse (12-16 beers/day)   Drug use: No   Sexual activity: Yes  Other Topics Concern   Not on file  Social History Narrative   Single   Has supportive sisters   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  Generalized: Well developed, in no acute distress   Neurological examination  Mentation: Alert oriented to time, place, history taking. Follows all commands speech and language fluent Cranial nerve II-XII: Pupils were equal round reactive to light. Extraocular movements were full, visual field were full on confrontational test. Facial sensation and strength were normal.  Head turning and shoulder shrug  were normal and symmetric. Motor: The motor testing reveals 5 over 5 strength of all 4 extremities. Good symmetric motor tone is noted throughout.  Sensory: Sensory testing is intact to soft touch on all 4 extremities. No evidence of extinction is noted.  Coordination: Cerebellar testing reveals good finger-nose-finger and heel-to-shin bilaterally.  Gait and station: uses a cane when ambulating.  Gait is slightly unsteady. Reflexes: Deep tendon reflexes are symmetric and normal bilaterally.   DIAGNOSTIC DATA (LABS, IMAGING, TESTING) - I reviewed patient records, labs, notes, testing and imaging myself where available.  Lab Results  Component Value Date   WBC 9.7 11/03/2022   HGB 14.1 11/03/2022   HCT 41.7 11/03/2022   MCV 86.3 11/03/2022   PLT 317 11/03/2022      Component Value Date/Time   NA 140 11/07/2022 1037   K 3.9 11/07/2022 1037   CL 100 11/07/2022 1037   CO2 31 11/07/2022 1037   GLUCOSE 119 (H) 04/13/2023 1031   BUN 9 11/07/2022 1037   CREATININE 0.81 11/07/2022 1037   CREATININE 0.87 11/11/2021 1600   CALCIUM 9.5 11/07/2022 1037   PROT 6.3 (L) 11/03/2022 1514   ALBUMIN 3.7 11/03/2022 1514   AST 25  11/03/2022 1514   ALT 26 11/03/2022 1514   ALKPHOS 60 11/03/2022 1514   BILITOT 0.6 11/03/2022 1514   GFRNONAA >60 11/03/2022 1514   Lab Results  Component Value Date  CHOL 84 11/07/2022   HDL 33.50 (L) 11/07/2022   LDLCALC 33 11/07/2022   TRIG 86.0 11/07/2022   CHOLHDL 3 11/07/2022   Lab Results  Component Value Date   HGBA1C 8.0 (H) 04/13/2023   Lab Results  Component Value Date   VITAMINB12 231 01/24/2022   Lab Results  Component Value Date   TSH 1.624 01/24/2022      ASSESSMENT AND PLAN 62 y.o. year old male  has a past medical history of Alcohol dependence (HCC), Chronic calcific pancreatitis (HCC), CVA (cerebral vascular accident) (HCC) (01/24/2022), Diabetes mellitus without complication (HCC), Hyperlipidemia, Hypertension, Melanoma (HCC) (2013), Pancreatic cyst-mass?, and Polyneuropathy. here with :  Stroke:  acute right thalamic and subacute left paramedian pontine infarcts, likely secondary to synchronized small vessel infarcts given risk factor Vertigo   Continue Aspirin 81 mg daily BP goal <130/80- manage by PCP LDL goal <70 currently 47 manage by PCP HgbAc goal <6.5 % currently 6.6 % Encouraged the patient to continue with healthy Diet and exercise.  Advised if he had any stroke like symptoms to go to the ED or call 911 Vertigo has improved.  Encouraged him to continue doing exercises learned at vestibular rehab If your symptoms worsen or you develop new symptoms please let us know.   Butch Penny, MSN, NP-C 05/30/2023, 10:04 AM [facility] [address], [address] [place], [place] [address] [phone]

## 2023-05-30 NOTE — Patient Instructions (Signed)
Your Plan:  Continue ASA  Blood pressure goal <130/90 Cholesterol LDL goal <70 Diabetes goal A1c <7 If Vertigo gets worse call us  Thank you for coming to see [place] at [facility]. I hope we have been able to provide you high quality care today.  You may receive a patient satisfaction survey over the next few weeks. We would appreciate your feedback and comments so that we may continue to improve ourselves and the health of our patients.

## 2023-06-08 ENCOUNTER — Other Ambulatory Visit: Payer: Self-pay | Admitting: Family Medicine

## 2023-07-12 ENCOUNTER — Other Ambulatory Visit: Payer: Self-pay | Admitting: Nurse Practitioner

## 2023-07-12 ENCOUNTER — Telehealth: Payer: Self-pay | Admitting: Family Medicine

## 2023-07-12 MED ORDER — TAMSULOSIN HCL 0.4 MG PO CAPS: 0.4000 mg | ORAL_CAPSULE | Freq: Every day | ORAL | 0 refills | Status: DC

## 2023-07-12 NOTE — Telephone Encounter (Signed)
Prescription Request  07/12/2023  LOV: 02/06/2023  What is the name of the medication or equipment? tamsulosin   Have you contacted your pharmacy to request a refill? Yes   Which pharmacy would you like this sent to?  [id number] [place], [place] - [address] AT [facility] [address] [place] [address] Phone: [phone] Fax: [phone]    Patient notified that their request is being sent to the clinical staff for review and that they should receive a response within 2 business days.   Please advise at Mobile [phone] (mobile)

## 2023-07-12 NOTE — Telephone Encounter (Signed)
Refill request for  Tamsulosin LR hx provider LOV  02/06/23 FOV   none scheduled.   Please review and advise.  Thanks.  Dm/cma

## 2023-07-12 NOTE — Telephone Encounter (Signed)
Will call in the morning. Dm/cma

## 2023-07-16 NOTE — Telephone Encounter (Signed)
Called patient, he is agreeable to an appointment but asked if I would call his sister to set this up.  Left VM to rtn call to schedule f/u at number in chart.  Dm/cma

## 2023-07-18 NOTE — Telephone Encounter (Signed)
Patient scheduled 08/22/2023 at 1:00 PM.  Dm/cma

## 2023-07-19 ENCOUNTER — Encounter (INDEPENDENT_AMBULATORY_CARE_PROVIDER_SITE_OTHER): Payer: Self-pay

## 2023-08-22 ENCOUNTER — Ambulatory Visit (INDEPENDENT_AMBULATORY_CARE_PROVIDER_SITE_OTHER): Payer: Medicaid Other | Admitting: Family Medicine

## 2023-08-22 ENCOUNTER — Encounter: Payer: Self-pay | Admitting: Family Medicine

## 2023-08-22 ENCOUNTER — Other Ambulatory Visit: Payer: Self-pay | Admitting: Family Medicine

## 2023-08-22 ENCOUNTER — Telehealth: Payer: Self-pay | Admitting: Family Medicine

## 2023-08-22 VITALS — BP 118/70 | HR 63 | Temp 98.1°F | Ht 64.0 in | Wt 142.2 lb

## 2023-08-22 DIAGNOSIS — H8110 Benign paroxysmal vertigo, unspecified ear: Secondary | ICD-10-CM

## 2023-08-22 DIAGNOSIS — I10 Essential (primary) hypertension: Secondary | ICD-10-CM

## 2023-08-22 DIAGNOSIS — Z8673 Personal history of transient ischemic attack (TIA), and cerebral infarction without residual deficits: Secondary | ICD-10-CM

## 2023-08-22 DIAGNOSIS — R197 Diarrhea, unspecified: Secondary | ICD-10-CM

## 2023-08-22 DIAGNOSIS — G6289 Other specified polyneuropathies: Secondary | ICD-10-CM

## 2023-08-22 DIAGNOSIS — Z7984 Long term (current) use of oral hypoglycemic drugs: Secondary | ICD-10-CM

## 2023-08-22 DIAGNOSIS — Z85820 Personal history of malignant melanoma of skin: Secondary | ICD-10-CM

## 2023-08-22 DIAGNOSIS — G629 Polyneuropathy, unspecified: Secondary | ICD-10-CM | POA: Insufficient documentation

## 2023-08-22 DIAGNOSIS — F1021 Alcohol dependence, in remission: Secondary | ICD-10-CM

## 2023-08-22 DIAGNOSIS — E119 Type 2 diabetes mellitus without complications: Secondary | ICD-10-CM | POA: Diagnosis not present

## 2023-08-22 DIAGNOSIS — M1712 Unilateral primary osteoarthritis, left knee: Secondary | ICD-10-CM | POA: Insufficient documentation

## 2023-08-22 DIAGNOSIS — E785 Hyperlipidemia, unspecified: Secondary | ICD-10-CM

## 2023-08-22 DIAGNOSIS — F819 Developmental disorder of scholastic skills, unspecified: Secondary | ICD-10-CM

## 2023-08-22 LAB — HEMOGLOBIN A1C: Hgb A1c MFr Bld: 7.3 % — ABNORMAL HIGH (ref 4.6–6.5)

## 2023-08-22 LAB — GLUCOSE, RANDOM: Glucose, Bld: 217 mg/dL — ABNORMAL HIGH (ref 70–99)

## 2023-08-22 MED ORDER — TAMSULOSIN HCL 0.4 MG PO CAPS: 0.4000 mg | ORAL_CAPSULE | Freq: Every day | ORAL | 0 refills | Status: DC

## 2023-08-22 MED ORDER — AMLODIPINE BESY-BENAZEPRIL HCL 10-20 MG PO CAPS: 1.0000 | ORAL_CAPSULE | Freq: Every day | ORAL | 3 refills | Status: DC

## 2023-08-22 MED ORDER — SITAGLIPTIN PHOSPHATE 50 MG PO TABS: 50.0000 mg | ORAL_TABLET | Freq: Every day | ORAL | 0 refills | Status: DC

## 2023-08-22 MED ORDER — METOPROLOL TARTRATE 100 MG PO TABS: 100.0000 mg | ORAL_TABLET | Freq: Two times a day (BID) | ORAL | 0 refills | Status: DC

## 2023-08-22 NOTE — Assessment & Plan Note (Signed)
Continue atorvastatin 40 mg daily.

## 2023-08-22 NOTE — Assessment & Plan Note (Signed)
Stable. Continue tamsulosin 0.4 mg daily.

## 2023-08-22 NOTE — Assessment & Plan Note (Signed)
Residual issues with right hand weakness and loss of manual dexterity. Has maximized benefit from PT/OT.

## 2023-08-22 NOTE — Assessment & Plan Note (Addendum)
Prior melanoma with metastasis to the brain. I had advised him in 2023 to follow-up with his oncologist at [facility] for cancer surveillance. I will try placing a referral back to Dr. Dan Europe and see if this helps facilitate this happening.

## 2023-08-22 NOTE — Progress Notes (Signed)
[name] [address] [place] [address] Dept: [phone] Dept Fax: [phone]  Chronic Care Office Visit  Subjective:    Patient ID: Kenneth [name], male    DOB: 07-Mar-1961, 62 y.o..   MRN: [id number]  Chief Complaint  Patient presents with   Hypertension    6 month f/u.  C/o having a cough x 2 months.  No OTC.     History of Present Illness:  Patient is in today for reassessment of chronic medical issues.  [name] was accompanied by his sister today. She raised several issues of concern:  [name] is applying for disability. This is primarily based on his difficulty ambulating. However, she also notes issues with dexterity in his right hand and with his balance when walking.  [name] has a history of alcoholism. He remains in sobriety since Feb. 2023. He lives with one of his sisters and this arrangement seems to be helping him. However, he has had a history of peripheral neuropathy that has impacted his strength in his legs. He has some arthritis in both knees. He also had a CVA in Feb. 2023 which did create weakness in his right side. Additionally, he has had intermittent vertigo issues which also contribute to his balance issues.    A further concern raised by his sister has been possible dyslexia. [name] notes a lifelong issue with reading, but this was never investigated when he was in school.   [name] has a history of malignant melanoma. His primary tumor was found on his right thigh in 2013. A PET scan showed metastases to his lungs and his brain. He underwent wide local excision of the primary tumor, cyberknife radiosurgery to the left frontotemporal lobe metastasis and was treated with ipilimumab. His cancer has apparently been in remission, but he notes he has not seen the oncologist since the COVID pandemic started.   [name] has a history of an elevated PSA. He is on Flomax  0.4 mg daily for LUTS.   [name] has a history of Type 2 diabetes. He is managed on metformin 1,000 mg bid and pioglitazone 30 mg daily. He notes a chronic issue with loose stools and fecal urgency. He has previously been seen by GI related to some of this and has had a scope procedure.   [name] has a history of hypertension. He is managed on amlodipine-benazepril 10-20 mg daily and metoprolol tartrate 100 mg bid.   [name] has dyslipidemia. He is managed on atorvastatin 20 mg daily.  Past Medical History: Patient Active Problem List   Diagnosis Date Noted   Peripheral neuropathy 08/22/2023   Osteoarthritis of left knee 08/22/2023   BPV (benign positional vertigo) 02/06/2023   Seborrhea 11/07/2022   History of melanoma 04/12/2022   Alcohol dependence in remission (HCC) 04/12/2022   Aortic atherosclerosis (HCC) 04/12/2022   Alcohol-induced chronic pancreatitis (HCC) 03/15/2022   History of CVA (cerebrovascular accident) 02/28/2022   Protein-calorie malnutrition, severe 02/03/2022   Gastritis and gastroduodenitis    Normocytic anemia 02/02/2022   Occult GI bleeding 02/01/2022   DNR (do not resuscitate) 02/01/2022   Vertigo 01/24/2022   Elevated PSA 11/24/2021   Hypokalemia 11/24/2021   History of immunotherapy 11/25/2019   Hx of colonic polyps 03/28/2017   Melanoma metastatic to brain (HCC) 08/08/2016   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HCC) 08/13/2014   Metastatic melanoma to lung (HCC) 12/12/2012   Type 2 diabetes mellitus (HCC) 08/20/2007  Dyslipidemia 08/20/2007   Essential hypertension 08/19/2007   Past Surgical History:  Procedure Laterality Date   BIOPSY  02/03/2022   Procedure: BIOPSY;  Surgeon: Iva Boop, MD;  Location: [facility] ENDOSCOPY;  Service: Gastroenterology;;   BRAIN SURGERY  2015   to check for possible malignancy from melanoma, result were scar tissue   COLONOSCOPY WITH PROPOFOL N/A 02/03/2022   Procedure: COLONOSCOPY WITH PROPOFOL;  Surgeon: Iva Boop, MD;  Location: [facility] ENDOSCOPY;  Service: Gastroenterology;  Laterality: N/A;   ESOPHAGOGASTRODUODENOSCOPY (EGD) WITH PROPOFOL N/A 02/03/2022   Procedure: ESOPHAGOGASTRODUODENOSCOPY (EGD) WITH PROPOFOL;  Surgeon: Iva Boop, MD;  Location: [facility] ENDOSCOPY;  Service: Gastroenterology;  Laterality: N/A;   melanoma exicison     right leg   Family History  Problem Relation Age of Onset   Heart disease Father    Prostate cancer Father    Cancer Maternal Grandmother        Brain   Stomach cancer Paternal Grandmother    Colon cancer Neg Hx    Esophageal cancer Neg Hx    Rectal cancer Neg Hx    Outpatient Medications Prior to Visit  Medication Sig Dispense Refill   aspirin EC 81 MG EC tablet Take 1 tablet (81 mg total) by mouth daily. Swallow whole. 30 tablet 11   atorvastatin (LIPITOR) 20 MG tablet TAKE 1 TABLET BY MOUTH DAILY 90 tablet 3   lactobacillus acidophilus (BACID) TABS tablet Take 1 tablet by mouth daily. Align     mirtazapine (REMERON) 7.5 MG tablet TAKE 1 TABLET BY MOUTH DAILY AT BEDTIME 90 tablet 3   Multiple Vitamin (MULTIVITAMIN WITH MINERALS) TABS tablet Take 1 tablet by mouth daily.     pioglitazone (ACTOS) 30 MG tablet Take 1 tablet (30 mg total) by mouth daily. 30 tablet 5   triamcinolone cream (KENALOG) 0.1 % Apply small amount topically to areas of scaly rash on face and neck once a day as needed. 30 g 5   amLODipine-benazepril (LOTREL) 10-20 MG capsule Take 1 capsule by mouth daily. 90 capsule 3   metFORMIN (GLUCOPHAGE) 1000 MG tablet TAKE 1 TABLET BY MOUTH TWICE DAILY 180 tablet 3   metoprolol tartrate (LOPRESSOR) 100 MG tablet TAKE 1 TABLET BY MOUTH TWICE DAILY 180 tablet 0   tamsulosin (FLOMAX) 0.4 MG CAPS capsule Take 1 capsule (0.4 mg total) by mouth daily. Needs appointment for future refills 30 capsule 0   magnesium oxide (MAG-OX) 400 (240 Mg) MG tablet Take 1 tablet (400 mg total) by mouth 2 (two) times daily.     meclizine (ANTIVERT) 25 MG tablet Take 1  tablet (25 mg total) by mouth 3 (three) times daily as needed for dizziness. 30 tablet 0   No facility-administered medications prior to visit.   Allergies  Allergen Reactions   Cat Hair Extract Shortness Of Breath and Swelling    Swelling, watery eyes   Objective:   Today's Vitals   08/22/23 1305  BP: 118/70  Pulse: 63  Temp: 98.1 F (36.7 C)  TempSrc: Temporal  SpO2: 99%  Weight: 142 lb 3.2 oz (64.5 kg)  Height: 5\' 4"  (1.626 m)   Body mass index is 24.41 kg/m.   General: Well developed, well nourished. No acute distress. Feet- Skin intact. No sign of maceration between toes. Nails are thickened, with subungual buildup and onychogryphosis.   Dorsalis pedis and posterior tibial artery pulses are normal. 5.07 monofilament testing shows near compelte loss of sensation on   the soles of  the feet. Psych: Alert and oriented. Normal mood and affect.  Health Maintenance Due  Topic Date Due   OPHTHALMOLOGY EXAM  Never done     Assessment & Plan:   Problem List Items Addressed This Visit       Cardiovascular and Mediastinum   Essential hypertension    Blood pressure is at goal. Continue amlodipine-benazepril 10-20 mg daily and metoprolol tartrate 100 mg bid.      Relevant Medications   amLODipine-benazepril (LOTREL) 10-20 MG capsule   metoprolol tartrate (LOPRESSOR) 100 MG tablet     Endocrine   Type 2 diabetes mellitus (HCC) - Primary    A1c has been improving. We will reassess this today. I am concerned that some of his loose stools may relate to his metformin use. I will switch him off of this to sitagliptin and plan to see him back in 3 months.      Relevant Medications   amLODipine-benazepril (LOTREL) 10-20 MG capsule   sitaGLIPtin (JANUVIA) 50 MG tablet   Other Relevant Orders   Glucose, random (Completed)   Hemoglobin A1c (Completed)     Nervous and Auditory   BPV (benign positional vertigo)    Stable. Continue tamsulosin 0.4 mg daily.      Relevant  Medications   tamsulosin (FLOMAX) 0.4 MG CAPS capsule   Peripheral neuropathy    Likely multifactorial due to diabetes and alcoholism. This contributes to lower leg weakness and balance difficulties.        Musculoskeletal and Integument   Osteoarthritis of left knee    This also plays a role in his difficulty with walking longer distances. He notes he cannot walk more than 50 yards without resting. He does use a cane to assist with ambulation.        Other   Alcohol dependence in remission (HCC)    Remains abstinent.      Dyslipidemia    Continue atorvastatin 40 mg daily.      History of CVA (cerebrovascular accident)    Residual issues with right hand weakness and loss of manual dexterity. Has maximized benefit from PT/OT.      History of melanoma    Prior melanoma with metastasis to the brain. I had advised him in 2023 to follow-up with his oncologist at [facility] for cancer surveillance. I will try placing a referral back to Dr. Dan Europe and see if this helps facilitate this happening.      Relevant Orders   Ambulatory referral to Hematology / Oncology   Other Visit Diagnoses     Frequent loose stools       This is likely due to chronic pancreatitis/pancreatic insufficiency. However, his metformin may play a role. I will stop his metformin and see if this improves.   Learning disability       I will refer to his neurologist to consider testing regardign potential dyslexia.   Relevant Orders   Ambulatory referral to Neurology      I spent 55 minutes reviewing prior medical records, taking a history from the patient and his sister, completing a focused assessment, developing a plan for ongoing care, completing a disabled parking placard form, ordering follow-up labs, placing referrals, and documenting the encounter.  Return in about 3 months (around 11/21/2023).   Loyola Mast, MD

## 2023-08-22 NOTE — Assessment & Plan Note (Signed)
A1c has been improving. We will reassess this today. I am concerned that some of his loose stools may relate to his metformin use. I will switch him off of this to sitagliptin and plan to see him back in 3 months.

## 2023-08-22 NOTE — Assessment & Plan Note (Signed)
Likely multifactorial due to diabetes and alcoholism. This contributes to lower leg weakness and balance difficulties.

## 2023-08-22 NOTE — Assessment & Plan Note (Signed)
Blood pressure is at goal. Continue amlodipine-benazepril 10-20 mg daily and metoprolol tartrate 100 mg bid.

## 2023-08-22 NOTE — Assessment & Plan Note (Signed)
Remains abstinent

## 2023-08-22 NOTE — Telephone Encounter (Signed)
Kenneth [name] [phone]  Called from Dr Thomasena Edis office stating that Dr Collochio is a pulmonary dr not oncology so thid referral needs to be sent to a different office. [name], [name] [address] [employer] [facility] Office [place] [address]

## 2023-08-22 NOTE — Assessment & Plan Note (Signed)
This also plays a role in his difficulty with walking longer distances. He notes he cannot walk more than 50 yards without resting. He does use a cane to assist with ambulation.

## 2023-08-23 NOTE — Telephone Encounter (Signed)
Referral was faxed correctly. Dr. Thomasena Edis was picked in error but fax was sent correctly. I did call Dorthy back

## 2023-08-27 NOTE — Addendum Note (Signed)
Addended by: Loyola Mast on: 08/27/2023 09:53 AM   Modules accepted: Orders

## 2023-09-06 ENCOUNTER — Other Ambulatory Visit (HOSPITAL_COMMUNITY): Payer: Self-pay

## 2023-09-12 ENCOUNTER — Other Ambulatory Visit: Payer: Self-pay | Admitting: Family Medicine

## 2023-09-12 DIAGNOSIS — E119 Type 2 diabetes mellitus without complications: Secondary | ICD-10-CM

## 2023-09-12 NOTE — Telephone Encounter (Addendum)
Pt sister called to f/u on RX. The pharmacy app keeps saying request was submitted to office. She has contacted pharmacy 3x and was advised to call us. Advised her we just received first request today at 4:05p and apologized for any miscommunication from pharmacy.  Refill needed for pioglitazone (ACTOS) 30 MG tablet - Would prefer 90 day supply   [id number] - [place], [place] - [address] AT [facility] [address]  [address], [place] [address]

## 2023-09-17 ENCOUNTER — Other Ambulatory Visit: Payer: Self-pay | Admitting: Family Medicine

## 2023-09-17 DIAGNOSIS — H8110 Benign paroxysmal vertigo, unspecified ear: Secondary | ICD-10-CM

## 2023-10-29 ENCOUNTER — Ambulatory Visit (INDEPENDENT_AMBULATORY_CARE_PROVIDER_SITE_OTHER): Payer: Medicaid Other | Admitting: Family Medicine

## 2023-10-29 ENCOUNTER — Encounter: Payer: Self-pay | Admitting: Family Medicine

## 2023-10-29 VITALS — BP 136/72 | HR 70 | Temp 99.0°F | Ht 64.0 in | Wt 161.0 lb

## 2023-10-29 DIAGNOSIS — E119 Type 2 diabetes mellitus without complications: Secondary | ICD-10-CM

## 2023-10-29 DIAGNOSIS — Z7984 Long term (current) use of oral hypoglycemic drugs: Secondary | ICD-10-CM | POA: Diagnosis not present

## 2023-10-29 DIAGNOSIS — Z85820 Personal history of malignant melanoma of skin: Secondary | ICD-10-CM

## 2023-10-29 DIAGNOSIS — I10 Essential (primary) hypertension: Secondary | ICD-10-CM

## 2023-10-29 DIAGNOSIS — R972 Elevated prostate specific antigen [PSA]: Secondary | ICD-10-CM

## 2023-10-29 DIAGNOSIS — E785 Hyperlipidemia, unspecified: Secondary | ICD-10-CM | POA: Diagnosis not present

## 2023-10-29 DIAGNOSIS — F1021 Alcohol dependence, in remission: Secondary | ICD-10-CM

## 2023-10-29 DIAGNOSIS — E876 Hypokalemia: Secondary | ICD-10-CM

## 2023-10-29 LAB — LIPID PANEL
Cholesterol: 120 mg/dL (ref 0–200)
HDL: 49 mg/dL (ref >39.00)
LDL Cholesterol: 45 mg/dL (ref 0–99)
NonHDL: 71.16
Total CHOL/HDL Ratio: 2
Triglycerides: 130 mg/dL (ref 0.0–149.0)
VLDL: 26 mg/dL (ref 0.0–40.0)

## 2023-10-29 LAB — URINALYSIS, ROUTINE W REFLEX MICROSCOPIC
Bilirubin Urine: NEGATIVE
Ketones, ur: NEGATIVE
Leukocytes,Ua: NEGATIVE
Nitrite: NEGATIVE
Specific Gravity, Urine: 1.025 (ref 1.000–1.030)
Urine Glucose: >=1000 — AB
Urobilinogen, UA: 0.2 (ref 0.0–1.0)
pH: 6 (ref 5.0–8.0)

## 2023-10-29 LAB — BASIC METABOLIC PANEL
BUN: 9 mg/dL (ref 6–23)
CO2: 27 meq/L (ref 19–32)
Calcium: 9 mg/dL (ref 8.4–10.5)
Chloride: 99 meq/L (ref 96–112)
Creatinine, Ser: 0.99 mg/dL (ref 0.40–1.50)
GFR: 81.57 mL/min (ref >60.00)
Glucose, Bld: 328 mg/dL — ABNORMAL HIGH (ref 70–99)
Potassium: 3.1 meq/L — ABNORMAL LOW (ref 3.5–5.1)
Sodium: 135 meq/L (ref 135–145)

## 2023-10-29 LAB — PSA, MEDICARE: PSA: 2.88 ng/mL (ref 0.10–4.00)

## 2023-10-29 LAB — HEMOGLOBIN A1C: Hgb A1c MFr Bld: 9 % — ABNORMAL HIGH (ref 4.6–6.5)

## 2023-10-29 NOTE — Assessment & Plan Note (Signed)
I will reassess the A1c today. Continue sitagliptin (Januvia) 50 mg daily and pioglitazone 30 mg daily.

## 2023-10-29 NOTE — Assessment & Plan Note (Signed)
I will reassess lipids today. Continue atorvastatin 40 mg daily.

## 2023-10-29 NOTE — Assessment & Plan Note (Signed)
Blood pressure is acceptable. Continue amlodipine-benazepril 10-20 mg daily and metoprolol tartrate 100 mg bid.

## 2023-10-29 NOTE — Assessment & Plan Note (Signed)
Due for annual assessment of PSA.

## 2023-10-29 NOTE — Assessment & Plan Note (Signed)
Remains abstinent

## 2023-10-29 NOTE — Assessment & Plan Note (Signed)
Prior melanoma with metastasis to the brain and lung. Referral back to Dr. Dan Europe still pending.

## 2023-10-29 NOTE — Progress Notes (Signed)
[facility] [address] [place] [address] Dept: [phone] Dept Fax: [phone]  Chronic Care Office Visit  Subjective:    Patient ID: Kenneth [name], male    DOB: 1961/08/31, 62 y.o..   MRN: [id number]  Chief Complaint  Patient presents with   Hypertension    2 month f/u   History of Present Illness:  Patient is in today for reassessment of chronic medical issues.  Kenneth [name] has a history of Type 2 diabetes. He is managed on sitagliptin (Januvia) 50 mg daily and pioglitazone 30 mg daily. He had previously been on metformin. We stopped this at his last appointment due to chronic diarrhea.    Kenneth [name] has a history of hypertension. He is managed on amlodipine-benazepril 10-20 mg daily and metoprolol tartrate 100 mg bid.   Kenneth [name] has dyslipidemia. He is managed on atorvastatin 20 mg daily.   Kenneth [name] has a history of metastatic malignant melanoma. His primary tumor was found on his right thigh in 2013. A PET scan showed metastases to his lungs and his brain. He underwent wide local excision of the primary tumor, cyberknife radiosurgery to the left frontotemporal lobe metastasis and was treated with ipilimumab. His cancer has apparently been in remission, but he notes he has not seen the oncologist since the COVID pandemic started. At his last appointment, I referred him back to his oncologist in [facility]. He notes he has not been contacted about this appointment.   Kenneth [name] has a history of an elevated PSA. He is on Flomax 0.4 mg daily for LUTS.   Past Medical History: Patient Active Problem List   Diagnosis Date Noted   Peripheral neuropathy 08/22/2023   Osteoarthritis of left knee 08/22/2023   BPV (benign positional vertigo) 02/06/2023   Seborrhea 11/07/2022   History of melanoma 04/12/2022   Alcohol dependence in remission (HCC) 04/12/2022   Aortic atherosclerosis (HCC) 04/12/2022    Alcohol-induced chronic pancreatitis (HCC) 03/15/2022   History of CVA (cerebrovascular accident) 02/28/2022   Gastritis and gastroduodenitis    DNR (do not resuscitate) 02/01/2022   Vertigo 01/24/2022   Elevated PSA 11/24/2021   History of immunotherapy 11/25/2019   Melanoma metastatic to brain (HCC) 08/08/2016   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HCC) 08/13/2014   Metastatic melanoma to lung (HCC) 12/12/2012   Type 2 diabetes mellitus (HCC) 08/20/2007   Dyslipidemia 08/20/2007   Essential hypertension 08/19/2007   Past Surgical History:  Procedure Laterality Date   BIOPSY  02/03/2022   Procedure: BIOPSY;  Surgeon: Iva Boop, MD;  Location: [facility] ENDOSCOPY;  Service: Gastroenterology;;   BRAIN SURGERY  2015   to check for possible malignancy from melanoma, result were scar tissue   COLONOSCOPY WITH PROPOFOL N/A 02/03/2022   Procedure: COLONOSCOPY WITH PROPOFOL;  Surgeon: Iva Boop, MD;  Location: [facility] ENDOSCOPY;  Service: Gastroenterology;  Laterality: N/A;   ESOPHAGOGASTRODUODENOSCOPY (EGD) WITH PROPOFOL N/A 02/03/2022   Procedure: ESOPHAGOGASTRODUODENOSCOPY (EGD) WITH PROPOFOL;  Surgeon: Iva Boop, MD;  Location: [facility] ENDOSCOPY;  Service: Gastroenterology;  Laterality: N/A;   melanoma exicison     right leg   Family History  Problem Relation Age of Onset   Heart disease Father    Prostate cancer Father    Cancer Maternal Grandmother        Brain   Stomach cancer Paternal Grandmother    Colon cancer Neg Hx    Esophageal cancer Neg Hx    Rectal cancer Neg Hx  Outpatient Medications Prior to Visit  Medication Sig Dispense Refill   amLODipine-benazepril (LOTREL) 10-20 MG capsule Take 1 capsule by mouth daily. 90 capsule 3   aspirin EC 81 MG EC tablet Take 1 tablet (81 mg total) by mouth daily. Swallow whole. 30 tablet 11   atorvastatin (LIPITOR) 20 MG tablet TAKE 1 TABLET BY MOUTH DAILY 90 tablet 3   lactobacillus acidophilus (BACID) TABS tablet Take 1 tablet by mouth daily. Align      metoprolol tartrate (LOPRESSOR) 100 MG tablet Take 1 tablet (100 mg total) by mouth 2 (two) times daily. 180 tablet 0   mirtazapine (REMERON) 7.5 MG tablet TAKE 1 TABLET BY MOUTH DAILY AT BEDTIME 90 tablet 3   Multiple Vitamin (MULTIVITAMIN WITH MINERALS) TABS tablet Take 1 tablet by mouth daily.     pioglitazone (ACTOS) 30 MG tablet TAKE 1 TABLET(30 MG) BY MOUTH DAILY 30 tablet 5   sitaGLIPtin (JANUVIA) 50 MG tablet Take 1 tablet (50 mg total) by mouth daily. 90 tablet 0   tamsulosin (FLOMAX) 0.4 MG CAPS capsule TAKE 1 CAPSULE(0.4 MG) BY MOUTH DAILY 90 capsule 3   triamcinolone cream (KENALOG) 0.1 % Apply small amount topically to areas of scaly rash on face and neck once a day as needed. 30 g 5   No facility-administered medications prior to visit.   Allergies  Allergen Reactions   Cat Hair Extract Shortness Of Breath and Swelling    Swelling, watery eyes   Objective:   Today's Vitals   10/29/23 1344  BP: 136/72  Pulse: 70  Temp: 99 F (37.2 C)  TempSrc: Temporal  SpO2: 99%  Weight: 161 lb (73 kg)  Height: 5\' 4"  (1.626 m)   Body mass index is 27.64 kg/m.   General: Well developed, well nourished. No acute distress. Extremities: Trace edema noted. Skin: Warm and dry. No rashes. Neuro: CN II-XII intact. Normal sensation and DTR bilaterally. Psych: Alert and oriented. Normal mood and affect.  Health Maintenance Due  Topic Date Due   OPHTHALMOLOGY EXAM  Never done   COVID-19 Vaccine (2 - Janssen risk series) 04/10/2020   DTaP/Tdap/Td (2 - Td or Tdap) 10/17/2023   Diabetic kidney evaluation - eGFR measurement  11/08/2023   Diabetic kidney evaluation - Urine ACR  11/08/2023     Assessment & Plan:   Problem List Items Addressed This Visit       Cardiovascular and Mediastinum   Essential hypertension    Blood pressure is acceptable. Continue amlodipine-benazepril 10-20 mg daily and metoprolol tartrate 100 mg bid.      Relevant Orders   Microalbumin / creatinine  urine ratio   Basic metabolic panel     Endocrine   Type 2 diabetes mellitus (HCC) - Primary    I will reassess the A1c today. Continue sitagliptin (Januvia) 50 mg daily and pioglitazone 30 mg daily.       Relevant Orders   Lipid panel   Microalbumin / creatinine urine ratio   Basic metabolic panel   Hemoglobin A1c   Urinalysis, Routine w reflex microscopic     Other   Alcohol dependence in remission (HCC)    Remains abstinent.      Dyslipidemia    I will reassess lipids today. Continue atorvastatin 40 mg daily.      Relevant Orders   Lipid panel   Elevated PSA    Due for annual assessment of PSA.      Relevant Orders   PSA, Medicare   History  of melanoma    Prior melanoma with metastasis to the brain and lung. Referral back to Dr. Dan Europe still pending.       Return in about 3 months (around 01/29/2024) for Reassessment.   Loyola Mast, MD

## 2023-10-30 LAB — MICROALBUMIN / CREATININE URINE RATIO
Creatinine,U: 68.8 mg/dL
Microalb Creat Ratio: 20.2 mg/g (ref 0.0–30.0)
Microalb, Ur: 13.9 mg/dL — ABNORMAL HIGH (ref 0.0–1.9)

## 2023-10-30 MED ORDER — POTASSIUM CHLORIDE CRYS ER 10 MEQ PO TBCR: 10.0000 meq | EXTENDED_RELEASE_TABLET | Freq: Two times a day (BID) | ORAL | 3 refills | Status: DC

## 2023-10-30 MED ORDER — SITAGLIPTIN PHOSPHATE 100 MG PO TABS: 100.0000 mg | ORAL_TABLET | Freq: Every day | ORAL | 3 refills | Status: DC

## 2023-10-30 NOTE — Addendum Note (Signed)
Addended by: Loyola Mast on: 10/30/2023 08:13 AM   Modules accepted: Orders

## 2023-12-19 ENCOUNTER — Encounter: Payer: Self-pay | Admitting: Gastroenterology

## 2023-12-26 ENCOUNTER — Other Ambulatory Visit: Payer: Self-pay | Admitting: Family Medicine

## 2023-12-26 DIAGNOSIS — I10 Essential (primary) hypertension: Secondary | ICD-10-CM

## 2024-01-07 ENCOUNTER — Ambulatory Visit: Payer: Self-pay | Admitting: Family Medicine

## 2024-01-07 ENCOUNTER — Encounter: Payer: Self-pay | Admitting: Family Medicine

## 2024-01-07 ENCOUNTER — Ambulatory Visit (INDEPENDENT_AMBULATORY_CARE_PROVIDER_SITE_OTHER): Payer: Medicaid Other | Admitting: Family Medicine

## 2024-01-07 ENCOUNTER — Telehealth: Payer: Self-pay | Admitting: Family Medicine

## 2024-01-07 VITALS — BP 124/72 | HR 64 | Temp 98.1°F | Ht 64.0 in | Wt 160.2 lb

## 2024-01-07 DIAGNOSIS — Z7984 Long term (current) use of oral hypoglycemic drugs: Secondary | ICD-10-CM

## 2024-01-07 DIAGNOSIS — R29898 Other symptoms and signs involving the musculoskeletal system: Secondary | ICD-10-CM | POA: Diagnosis not present

## 2024-01-07 DIAGNOSIS — D225 Melanocytic nevi of trunk: Secondary | ICD-10-CM

## 2024-01-07 DIAGNOSIS — E119 Type 2 diabetes mellitus without complications: Secondary | ICD-10-CM

## 2024-01-07 DIAGNOSIS — E876 Hypokalemia: Secondary | ICD-10-CM

## 2024-01-07 DIAGNOSIS — Z8673 Personal history of transient ischemic attack (TIA), and cerebral infarction without residual deficits: Secondary | ICD-10-CM | POA: Diagnosis not present

## 2024-01-07 NOTE — Telephone Encounter (Signed)
Patient scheduled today @ 3:20 pm. Dm/cma

## 2024-01-07 NOTE — Telephone Encounter (Signed)
FYI: This call has been transferred to triage nurse: the Triage Nurse. Once the result note has been entered staff can address the message at that time.  Patient called in with the following symptoms:  Red Word: R leg weakness and shaky since  01/04/24. I called pt he scheduled an OV with Dr Veto Kemps via Earleen Reaper.    Please advise at [facility] [phone]  Message is routed to Provider Pool.

## 2024-01-07 NOTE — Progress Notes (Signed)
[facility] [address] [place] [address] Dept: [phone] Dept Fax: [phone]  Office Visit  Subjective:    Patient ID: Kenneth [name], male    DOB: 03-05-1961, 63 y.o..   MRN: [id number]  Chief Complaint  Patient presents with   Extremity Weakness    C/o having RT leg weakness x 3 days.   No OTC meds taken.    History of Present Illness:  Patient is in today complaining of a 3-day history of weakness in his right lower leg. He notes this seemed to come on suddenly. He describes both dragging his foot, but also feeling like his knee gives out. He denies any low back pain or sciatica. He is not having any new numbness or tingling. Mr. Meloche has a prior history of a CVA. This has involved mostly small vessel disease/infarcts and a left pontine microhemorrhage. He is currently on a daily aspirin and atorvastatin 20 mg daily. He also has hypertension managed on amlodipine benazepril 10-20 mg daily and metoprolol tartrate 100 mg bid. He has not noted any changes in his upper extremities or face.  Mr. Loden sister also points out he has a lump on his right upper abdomen. He has a prior history of melanoma.  Past Medical History: Patient Active Problem List   Diagnosis Date Noted   Peripheral neuropathy 08/22/2023   Osteoarthritis of left knee 08/22/2023   BPV (benign positional vertigo) 02/06/2023   Seborrhea 11/07/2022   History of melanoma 04/12/2022   Alcohol dependence in remission (HCC) 04/12/2022   Aortic atherosclerosis (HCC) 04/12/2022   Alcohol-induced chronic pancreatitis (HCC) 03/15/2022   History of CVA (cerebrovascular accident) 02/28/2022   Gastritis and gastroduodenitis    DNR (do not resuscitate) 02/01/2022   Vertigo 01/24/2022   Elevated PSA 11/24/2021   History of immunotherapy 11/25/2019   Melanoma metastatic to brain (HCC) 08/08/2016   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HCC) 08/13/2014   Metastatic  melanoma to lung (HCC) 12/12/2012   Type 2 diabetes mellitus (HCC) 08/20/2007   Dyslipidemia 08/20/2007   Essential hypertension 08/19/2007   Past Surgical History:  Procedure Laterality Date   BIOPSY  02/03/2022   Procedure: BIOPSY;  Surgeon: Iva Boop, MD;  Location: [facility] ENDOSCOPY;  Service: Gastroenterology;;   BRAIN SURGERY  2015   to check for possible malignancy from melanoma, result were scar tissue   COLONOSCOPY WITH PROPOFOL N/A 02/03/2022   Procedure: COLONOSCOPY WITH PROPOFOL;  Surgeon: Iva Boop, MD;  Location: [facility] ENDOSCOPY;  Service: Gastroenterology;  Laterality: N/A;   ESOPHAGOGASTRODUODENOSCOPY (EGD) WITH PROPOFOL N/A 02/03/2022   Procedure: ESOPHAGOGASTRODUODENOSCOPY (EGD) WITH PROPOFOL;  Surgeon: Iva Boop, MD;  Location: [facility] ENDOSCOPY;  Service: Gastroenterology;  Laterality: N/A;   melanoma exicison     right leg   Family History  Problem Relation Age of Onset   Heart disease Father    Prostate cancer Father    Cancer Maternal Grandmother        Brain   Stomach cancer Paternal Grandmother    Colon cancer Neg Hx    Esophageal cancer Neg Hx    Rectal cancer Neg Hx    Outpatient Medications Prior to Visit  Medication Sig Dispense Refill   amLODipine-benazepril (LOTREL) 10-20 MG capsule Take 1 capsule by mouth daily. 90 capsule 3   aspirin EC 81 MG EC tablet Take 1 tablet (81 mg total) by mouth daily. Swallow whole. 30 tablet 11   atorvastatin (LIPITOR) 20 MG tablet TAKE  1 TABLET BY MOUTH DAILY 90 tablet 3   lactobacillus acidophilus (BACID) TABS tablet Take 1 tablet by mouth daily. Align     metoprolol tartrate (LOPRESSOR) 100 MG tablet TAKE 1 TABLET(100 MG) BY MOUTH TWICE DAILY 180 tablet 3   mirtazapine (REMERON) 7.5 MG tablet TAKE 1 TABLET BY MOUTH DAILY AT BEDTIME 90 tablet 3   Multiple Vitamin (MULTIVITAMIN WITH MINERALS) TABS tablet Take 1 tablet by mouth daily.     pioglitazone (ACTOS) 30 MG tablet TAKE 1 TABLET(30 MG) BY MOUTH DAILY 30 tablet 5    potassium chloride (KLOR-CON M) 10 MEQ tablet Take 1 tablet (10 mEq total) by mouth 2 (two) times daily. 30 tablet 3   sitaGLIPtin (JANUVIA) 100 MG tablet Take 1 tablet (100 mg total) by mouth daily. 90 tablet 3   tamsulosin (FLOMAX) 0.4 MG CAPS capsule TAKE 1 CAPSULE(0.4 MG) BY MOUTH DAILY 90 capsule 3   triamcinolone cream (KENALOG) 0.1 % Apply small amount topically to areas of scaly rash on face and neck once a day as needed. 30 g 5   No facility-administered medications prior to visit.   Allergies  Allergen Reactions   Cat Dander Shortness Of Breath and Swelling    Swelling, watery eyes     Objective:   Today's Vitals   01/07/24 1511  BP: 124/72  Pulse: 64  Temp: 98.1 F (36.7 C)  TempSrc: Temporal  SpO2: 100%  Weight: 160 lb 3.2 oz (72.7 kg)  Height: 5\' 4"  (1.626 m)   Body mass index is 27.5 kg/m.   General: Well developed, well nourished. No acute distress. HEENT: Normocephalic, non-traumatic. PERRL, EOMI.  Extremities: Full ROM. Strength 5/5, except 4/5 in right great toe extensors.  Neuro: Decreased sensation in mid right calf area. DTR absent bilaterally. Skin:   There is a 1 cm raised lesion on the right upper abdomen. This has a smooth surface and some mix of colors present. Psych: Alert and oriented. Normal mood and affect.  Health Maintenance Due  Topic Date Due   Pneumococcal Vaccine 22-5 Years old (1 of 2 - PCV) Never done   OPHTHALMOLOGY EXAM  Never done   Zoster Vaccines- Shingrix (1 of 2) Never done   DTaP/Tdap/Td (2 - Td or Tdap) 10/17/2023     Assessment & Plan:   Problem List Items Addressed This Visit       Other   History of CVA (cerebrovascular accident)   Relevant Orders   Ambulatory referral to Neurology   Ambulatory referral to Physical Therapy   Other Visit Diagnoses       Right leg weakness    -  Primary   Etiology is unclear. May related to neuropathy. I will check labs to screen for metabolic causes. I will also refer him  to his neurologist for an assessment.   Relevant Orders   Ambulatory referral to Neurology   Comprehensive metabolic panel   Vitamin B12   Ambulatory referral to Physical Therapy     Melanocytic nevus of trunk       History of metastatic melanoma. Lesion has some suspicious characteristics. I strongly urge him to see his dermatlogist and will place a referral.   Relevant Orders   Ambulatory referral to Dermatology      Return for Follow-up as scheduled.   Loyola Mast, MD

## 2024-01-07 NOTE — Telephone Encounter (Signed)
  Chief Complaint: R knee weakness Symptoms: weakness, shaky Frequency: Friday Pertinent Negatives: Patient denies pain, swelling, redness, thigh/calf pain Disposition: [] ED /[] Urgent Care (no appt availability in office) / [x] Appointment(In office/virtual)/ []  [facility]/ [] Home Care/ [] Refused Recommended Disposition /[] [facility]/ []  Follow-up with PCP Additional Notes: Patient c.o R knee weakness/shakiness since Friday. Denies any fevers, injuries, swelling, or redness, thigh/calf pain. Reports that R knee weakness caused a fall Saturday morning. Denies head injury and reports he fell on his butt. Reports that he is still able to bare weight and walk, and endorses cane/walker use. Patient was previously scheduled and appt is appropriate per protocol. Patient verbalized understanding and to call back with worsening symptoms.   Reason for Disposition  [1] MODERATE pain (e.g., interferes with normal activities, limping) AND [2] present > 3 days  Answer Assessment - Initial Assessment Questions 1. ONSET: "When did the pain start?"      Friday 2. LOCATION: "Where is the pain located?"      R leg 3. PAIN: "How bad is the pain?"    (Scale 1-10; or mild, moderate, severe)   -  MILD (1-3): doesn't interfere with normal activities    -  MODERATE (4-7): interferes with normal activities (e.g., work or school) or awakens from sleep, limping    -  SEVERE (8-10): excruciating pain, unable to do any normal activities, unable to walk     No pain, no swelling, just weakness/shaky 4. WORK OR EXERCISE: "Has there been any recent work or exercise that involved this part of the body?"      no 5. CAUSE: "What do you think is causing the leg weakness?"     2023- hx stroke with falls post stroke Report fall Saturday morning from tripping on med - denies head injury, reports "fell flat on my butt" 6. OTHER SYMPTOMS: "Do you have any other symptoms?" (e.g., chest pain, back pain,  breathing difficulty, swelling, rash, fever, numbness, weakness)     No, just weakness  Answer Assessment - Initial Assessment Questions 1. LOCATION and RADIATION: "Where is the pain located?"      R knee 2. QUALITY: "What does the pain feel like?"  (e.g., sharp, dull, aching, burning)     Weakness/shaky 3. SEVERITY: "How bad is the pain?" "What does it keep you from doing?"   (Scale 1-10; or mild, moderate, severe)   -  MILD (1-3): doesn't interfere with normal activities    -  MODERATE (4-7): interferes with normal activities (e.g., work or school) or awakens from sleep, limping    -  SEVERE (8-10): excruciating pain, unable to do any normal activities, unable to walk     Mild - uses walker/cane   8. ASSOCIATED SYMPTOMS: "Is there any swelling or redness of the knee?"     no  Protocols used: Leg Pain-A-AH, Knee Pain-A-AH FYI: This call has been transferred to triage nurse: the Triage Nurse. Once the result note has been entered staff can address the message at that time.   Patient called in with the following symptoms:   Red Word: R leg weakness and shaky since  01/04/24. I called pt he scheduled an OV with Dr Veto Kemps via Earleen Reaper.      Please advise at [facility] [phone]   Message is routed to Provider Pool.

## 2024-01-08 ENCOUNTER — Encounter: Payer: Self-pay | Admitting: Family Medicine

## 2024-01-08 LAB — COMPREHENSIVE METABOLIC PANEL
ALT: 16 U/L (ref 0–53)
AST: 16 U/L (ref 0–37)
Albumin: 4 g/dL (ref 3.5–5.2)
Alkaline Phosphatase: 57 U/L (ref 39–117)
BUN: 12 mg/dL (ref 6–23)
CO2: 27 meq/L (ref 19–32)
Calcium: 8.6 mg/dL (ref 8.4–10.5)
Chloride: 102 meq/L (ref 96–112)
Creatinine, Ser: 1.24 mg/dL (ref 0.40–1.50)
GFR: 62.17 mL/min (ref >60.00)
Glucose, Bld: 226 mg/dL — ABNORMAL HIGH (ref 70–99)
Potassium: 3.2 meq/L — ABNORMAL LOW (ref 3.5–5.1)
Sodium: 139 meq/L (ref 135–145)
Total Bilirubin: 0.2 mg/dL (ref 0.2–1.2)
Total Protein: 6.9 g/dL (ref 6.0–8.3)

## 2024-01-08 LAB — VITAMIN B12: Vitamin B-12: 485 pg/mL (ref 211–911)

## 2024-01-08 MED ORDER — POTASSIUM CHLORIDE CRYS ER 20 MEQ PO TBCR: 20.0000 meq | EXTENDED_RELEASE_TABLET | Freq: Two times a day (BID) | ORAL | 3 refills | Status: DC

## 2024-01-08 MED ORDER — GLIPIZIDE ER 5 MG PO TB24: 5.0000 mg | ORAL_TABLET | Freq: Every day | ORAL | 3 refills | Status: DC

## 2024-01-08 NOTE — Addendum Note (Signed)
Addended by: Loyola Mast on: 01/08/2024 01:08 PM   Modules accepted: Orders

## 2024-01-15 ENCOUNTER — Ambulatory Visit: Payer: Medicaid Other | Attending: Family Medicine | Admitting: Physical Therapy

## 2024-01-15 ENCOUNTER — Encounter: Payer: Self-pay | Admitting: Physical Therapy

## 2024-01-15 ENCOUNTER — Other Ambulatory Visit: Payer: Self-pay

## 2024-01-15 DIAGNOSIS — Z8673 Personal history of transient ischemic attack (TIA), and cerebral infarction without residual deficits: Secondary | ICD-10-CM | POA: Insufficient documentation

## 2024-01-15 DIAGNOSIS — Z9181 History of falling: Secondary | ICD-10-CM | POA: Insufficient documentation

## 2024-01-15 DIAGNOSIS — M6281 Muscle weakness (generalized): Secondary | ICD-10-CM | POA: Insufficient documentation

## 2024-01-15 DIAGNOSIS — R2681 Unsteadiness on feet: Secondary | ICD-10-CM | POA: Insufficient documentation

## 2024-01-15 DIAGNOSIS — R262 Difficulty in walking, not elsewhere classified: Secondary | ICD-10-CM | POA: Insufficient documentation

## 2024-01-15 DIAGNOSIS — R29898 Other symptoms and signs involving the musculoskeletal system: Secondary | ICD-10-CM | POA: Diagnosis not present

## 2024-01-15 NOTE — Therapy (Signed)
 OUTPATIENT PHYSICAL THERAPY LOWER EXTREMITY EVALUATION   Patient Name: Kenneth [name] MRN: [id number] DOB:1961-06-02, 63 y.o., male Today's Date: 01/15/2024  END OF SESSION:  PT End of Session - 01/15/24 1110     Visit Number 1    Number of Visits 13    Date for PT Re-Evaluation 02/26/24    Authorization Type [employer] MCD    Authorization Time Period 01/15/24 to 02/26/24    Authorization - Number of Visits 27   combined per year   PT Start Time 1013    PT Stop Time 1056    PT Time Calculation (min) 43 min    Activity Tolerance Patient tolerated treatment well    Behavior During Therapy [facility] for tasks assessed/performed             Past Medical History:  Diagnosis Date   Alcohol dependence (HCC)    Chronic calcific pancreatitis (HCC)    CVA (cerebral vascular accident) (HCC) 01/24/2022   Diabetes mellitus without complication (HCC)    Hyperlipidemia    Hypertension    Melanoma (HCC) 2013   metastatic - resected and cured with chemo/immuno tx   Pancreatic cyst-mass?    Polyneuropathy    Past Surgical History:  Procedure Laterality Date   BIOPSY  02/03/2022   Procedure: BIOPSY;  Surgeon: Iva Boop, MD;  Location: [facility] ENDOSCOPY;  Service: Gastroenterology;;   BRAIN SURGERY  2015   to check for possible malignancy from melanoma, result were scar tissue   COLONOSCOPY WITH PROPOFOL N/A 02/03/2022   Procedure: COLONOSCOPY WITH PROPOFOL;  Surgeon: Iva Boop, MD;  Location: [facility] ENDOSCOPY;  Service: Gastroenterology;  Laterality: N/A;   ESOPHAGOGASTRODUODENOSCOPY (EGD) WITH PROPOFOL N/A 02/03/2022   Procedure: ESOPHAGOGASTRODUODENOSCOPY (EGD) WITH PROPOFOL;  Surgeon: Iva Boop, MD;  Location: [facility] ENDOSCOPY;  Service: Gastroenterology;  Laterality: N/A;   melanoma exicison     right leg   Patient Active Problem List   Diagnosis Date Noted   Peripheral neuropathy 08/22/2023   Osteoarthritis of left knee 08/22/2023   BPV (benign positional vertigo) 02/06/2023    Seborrhea 11/07/2022   History of melanoma 04/12/2022   Alcohol dependence in remission (HCC) 04/12/2022   Aortic atherosclerosis (HCC) 04/12/2022   Alcohol-induced chronic pancreatitis (HCC) 03/15/2022   History of CVA (cerebrovascular accident) 02/28/2022   Gastritis and gastroduodenitis    DNR (do not resuscitate) 02/01/2022   Vertigo 01/24/2022   Elevated PSA 11/24/2021   History of immunotherapy 11/25/2019   Melanoma metastatic to brain (HCC) 08/08/2016   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HCC) 08/13/2014   Metastatic melanoma to lung (HCC) 12/12/2012   Type 2 diabetes mellitus (HCC) 08/20/2007   Dyslipidemia 08/20/2007   Essential hypertension 08/19/2007    PCP: Loyola Mast, MD  REFERRING PROVIDER: Loyola Mast, MD  REFERRING DIAG: Diagnosis R29.898 (ICD-10-CM) - Right leg weakness Z86.73 (ICD-10-CM) - History of CVA (cerebrovascular accident)  THERAPY DIAG:  Unsteadiness on feet  Muscle weakness (generalized)  Difficulty in walking, not elsewhere classified  History of falling  Rationale for Evaluation and Treatment: Rehabilitation  ONSET DATE: a couple of weeks ago   SUBJECTIVE:   SUBJECTIVE STATEMENT:  A couple of Fridays ago my right leg started just not acting right, wouldn't move forward and would move in and out, in and out just standing. Have had some falls due to the leg, Saturday morning I was going around the bed and fell backwards on my butt. Up and moving. Dr. Veto Kemps also sent me to the  neurologist but I couldn't get in until April 28th, trying to see if we can get in with another one sooner.   PERTINENT HISTORY: See above  PAIN:  Are you having pain? No  PRECAUTIONS: Fall  RED FLAGS: None   WEIGHT BEARING RESTRICTIONS: No  FALLS:  Has patient fallen in last 6 months? Yes. Number of falls 1 on Saturday, no FOF   LIVING ENVIRONMENT: Lives with: lives with their family Lives in: House/apartment- townhouse  Stairs: No Has following equipment at  home: Environmental consultant - 2 wheeled and Wheelchair (manual)  OCCUPATION: not working   PLOF: Independent with gait, Independent with transfers, and Requires assistive device for independence  PATIENT GOALS: to be able to get around on my own completely, be more independent   NEXT MD VISIT: Dr. Veto Kemps in another month or so, neurologist April 28th  OBJECTIVE:  Note: Objective measures were completed at Evaluation unless otherwise noted.  DIAGNOSTIC FINDINGS:     Narrative & Impression CLINICAL DATA:  Vertigo, peripheral. Right thalamic infarct in February of 2023   EXAM: CT HEAD WITHOUT CONTRAST   TECHNIQUE: Contiguous axial images were obtained from the base of the skull through the vertex without intravenous contrast.   RADIATION DOSE REDUCTION: This exam was performed according to the departmental dose-optimization program which includes automated exposure control, adjustment of the mA and/or kV according to patient size and/or use of iterative reconstruction technique.   COMPARISON:  01/24/2022   FINDINGS: Brain: No evidence of acute infarction, hemorrhage, hydrocephalus, extra-axial collection or mass lesion/mass effect. Multiple chronic bilateral basal ganglia and thalamic lacunar infarcts. Left frontal lobe encephalomalacia. Extensive low-density changes within the periventricular and subcortical white matter compatible with chronic microvascular ischemic change. Mild diffuse cerebral volume loss.   Vascular: No hyperdense vessel or unexpected calcification.   Skull: Prior left temporal craniotomy. No calvarial fracture or suspicious lesion.   Sinuses/Orbits: No acute finding.   Other: None.   IMPRESSION: 1. No acute intracranial abnormality. 2. Chronic microvascular ischemic change and cerebral volume loss.  PATIENT SURVEYS:  ABC scale 76.3%  COGNITION: Overall cognitive status: Within functional limits for tasks assessed     SENSATION: Not tested hx of  neuropathy     COORDINATION  Impaired coordination R LE, possibly chronic since CVA       LOWER EXTREMITY MMT:  MMT Right eval Left eval  Hip flexion 3 4+  Hip extension    Hip abduction 4 5  Hip adduction    Hip internal rotation    Hip external rotation    Knee flexion 4+ 5  Knee extension 4+ 5  Ankle dorsiflexion 4+ 4+  Ankle plantarflexion    Ankle inversion    Ankle eversion     (Blank rows = not tested)   FUNCTIONAL TESTS:  5 times sit to stand: 13 seconds use of BUEs on chair, posterior unsteadiness/poor eccentric control  Berg: 29/56 3 minute walk test: 447ft SPC   GAIT: Distance walked: 484ft Assistive device utilized: Single point cane Level of assistance: Modified independence Comments: chronic post-stroke impairments noted, limited WB RLE, limited ankle DF and knee ROM during gait cycle  TREATMENT DATE:   Eval, POC, HEP  Bridges x10  Standing hip ABD red TB above knees x10  Discussed/demonstrated tandem stance in corner /chair in front     PATIENT EDUCATION:  Education details: exam, POC, HEP  Person educated: Patient Education method: Programmer, multimedia, Facilities manager, and Handouts Education comprehension: verbalized understanding, returned demonstration, and needs further education  HOME EXERCISE PROGRAM: Access Code: [email or web address] URL: [email or web address] Date: 01/15/2024 Prepared by: Nedra Hai  Exercises - Supine Bridge  - 1 x daily - 7 x weekly - 2 sets - 10 reps - 2 seconds  hold - Standing Hip Abduction with Resistance at Thighs  - 1 x daily - 7 x weekly - 2 sets - 10 reps - 1 second hold - Tandem Stance in Corner  - 1 x daily - 7 x weekly - 2 sets - 3 reps - 30 seconds  hold  ASSESSMENT:  CLINICAL IMPRESSION: Patient is a 63 y.o. M who was seen today for physical therapy evaluation and  treatment for Diagnosis R29.898 (ICD-10-CM) - Right leg weakness Z86.73 (ICD-10-CM) - History of CVA (cerebrovascular accident). RLE is slightly weaker than the L with targeted MMT but appropriate given ongoing impairments related to chronic CVA. Of more concern is general balance and coordination- didn't score well on Berg and is definitely a high fall risk. Will benefit from skilled PT services to address all findings and assist in return to optimal level of function.   OBJECTIVE IMPAIRMENTS: Abnormal gait, decreased activity tolerance, decreased balance, decreased knowledge of use of DME, decreased mobility, difficulty walking, and decreased strength.   ACTIVITY LIMITATIONS: sitting, standing, squatting, stairs, transfers, and locomotion level  PARTICIPATION LIMITATIONS: driving, shopping, community activity, occupation, and yard work  PERSONAL FACTORS: Age, Fitness, Past/current experiences, Social background, and Time since onset of injury/illness/exacerbation are also affecting patient's functional outcome.   REHAB POTENTIAL: Good  CLINICAL DECISION MAKING: Stable/uncomplicated  EVALUATION COMPLEXITY: Low   GOALS: Goals reviewed with patient? No  SHORT TERM GOALS: Target date: 02/05/2024   Will be compliant with appropriate progressive HEP  Baseline: Goal status: INITIAL  2.  Will score at least 35 on Berg to show improved balance  Baseline:  Goal status: INITIAL  3.  Will be able to name 3 ways to prevent fall at home and in the community  Baseline:  Goal status: INITIAL    LONG TERM GOALS: Target date: 02/26/2024    MMT to have improved by one grade in all weak groups Baseline:  Goal status: INITIAL  2.  Will score at least 42 on Berg to show reduced fall risk  Baseline:  Goal status: INITIAL  3.  Will be able to perform all daily dressing/self care activities on an independent basis without LOB  Baseline:  Goal status: INITIAL  4.  Will be able to ambulate at  least 536ft in with LRAD to show improved community access, fatigue no more than 3/10 Baseline:  Goal status: INITIAL  5.  Will improve ABC score by at least 10 points to show improved subjective function  Baseline:  Goal status: INITIAL     PLAN:  PT FREQUENCY: 2x/week  PT DURATION: 6 weeks  PLANNED INTERVENTIONS: 97164- PT Re-evaluation, 97110-Therapeutic exercises, 97530- Therapeutic activity, [id number]- Neuromuscular re-education, [phone]- Self Care, 56213- Gait training, and [phone]- Aquatic Therapy  PLAN FOR NEXT SESSION: heavy focus on balance, strengthening as able, general conditioning   Nedra Hai, PT, DPT 01/15/24 11:12 AM  For all possible CPT codes, reference  the Planned Interventions line above.     Check all conditions that are expected to impact treatment: {Conditions expected to impact treatment:Morbid obesity, Diabetes mellitus, Neurological condition and/or seizures, and Social determinants of health   If treatment provided at initial evaluation, no treatment charged due to lack of authorization.

## 2024-01-17 ENCOUNTER — Encounter: Payer: Self-pay | Admitting: Physical Therapy

## 2024-01-17 ENCOUNTER — Ambulatory Visit: Payer: Medicaid Other | Admitting: Physical Therapy

## 2024-01-17 DIAGNOSIS — R262 Difficulty in walking, not elsewhere classified: Secondary | ICD-10-CM

## 2024-01-17 DIAGNOSIS — M6281 Muscle weakness (generalized): Secondary | ICD-10-CM

## 2024-01-17 DIAGNOSIS — R2681 Unsteadiness on feet: Secondary | ICD-10-CM | POA: Diagnosis not present

## 2024-01-17 DIAGNOSIS — Z9181 History of falling: Secondary | ICD-10-CM

## 2024-01-17 NOTE — Therapy (Signed)
 OUTPATIENT PHYSICAL THERAPY LOWER EXTREMITY TREATMENT    Patient Name: Kenneth [name] MRN: [id number] DOB:October 10, 1961, 63 y.o., male Today's Date: 01/17/2024  END OF SESSION:  PT End of Session - 01/17/24 1311     Visit Number 2    Number of Visits 13    Date for PT Re-Evaluation 02/26/24    Authorization Type [employer] MCD    Authorization Time Period 01/15/24 to 02/26/24    Authorization - Number of Visits 27   combined per year   PT Start Time 1303    PT Stop Time 1342    PT Time Calculation (min) 39 min    Activity Tolerance Patient tolerated treatment well    Behavior During Therapy [facility] for tasks assessed/performed              Past Medical History:  Diagnosis Date   Alcohol dependence (HCC)    Chronic calcific pancreatitis (HCC)    CVA (cerebral vascular accident) (HCC) 01/24/2022   Diabetes mellitus without complication (HCC)    Hyperlipidemia    Hypertension    Melanoma (HCC) 2013   metastatic - resected and cured with chemo/immuno tx   Pancreatic cyst-mass?    Polyneuropathy    Past Surgical History:  Procedure Laterality Date   BIOPSY  02/03/2022   Procedure: BIOPSY;  Surgeon: Iva Boop, MD;  Location: [facility] ENDOSCOPY;  Service: Gastroenterology;;   BRAIN SURGERY  2015   to check for possible malignancy from melanoma, result were scar tissue   COLONOSCOPY WITH PROPOFOL N/A 02/03/2022   Procedure: COLONOSCOPY WITH PROPOFOL;  Surgeon: Iva Boop, MD;  Location: [facility] ENDOSCOPY;  Service: Gastroenterology;  Laterality: N/A;   ESOPHAGOGASTRODUODENOSCOPY (EGD) WITH PROPOFOL N/A 02/03/2022   Procedure: ESOPHAGOGASTRODUODENOSCOPY (EGD) WITH PROPOFOL;  Surgeon: Iva Boop, MD;  Location: [facility] ENDOSCOPY;  Service: Gastroenterology;  Laterality: N/A;   melanoma exicison     right leg   Patient Active Problem List   Diagnosis Date Noted   Peripheral neuropathy 08/22/2023   Osteoarthritis of left knee 08/22/2023   BPV (benign positional vertigo) 02/06/2023    Seborrhea 11/07/2022   History of melanoma 04/12/2022   Alcohol dependence in remission (HCC) 04/12/2022   Aortic atherosclerosis (HCC) 04/12/2022   Alcohol-induced chronic pancreatitis (HCC) 03/15/2022   History of CVA (cerebrovascular accident) 02/28/2022   Gastritis and gastroduodenitis    DNR (do not resuscitate) 02/01/2022   Vertigo 01/24/2022   Elevated PSA 11/24/2021   History of immunotherapy 11/25/2019   Melanoma metastatic to brain (HCC) 08/08/2016   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HCC) 08/13/2014   Metastatic melanoma to lung (HCC) 12/12/2012   Type 2 diabetes mellitus (HCC) 08/20/2007   Dyslipidemia 08/20/2007   Essential hypertension 08/19/2007    PCP: Loyola Mast, MD  REFERRING PROVIDER: Loyola Mast, MD  REFERRING DIAG: Diagnosis R29.898 (ICD-10-CM) - Right leg weakness Z86.73 (ICD-10-CM) - History of CVA (cerebrovascular accident)  THERAPY DIAG:  Unsteadiness on feet  Muscle weakness (generalized)  Difficulty in walking, not elsewhere classified  History of falling  Rationale for Evaluation and Treatment: Rehabilitation  ONSET DATE: a couple of weeks ago   SUBJECTIVE:   SUBJECTIVE STATEMENT:  Doing well today, I was stiff yesterday but ok today. No falls or anything new since last time      EVAL: A couple of Fridays ago my right leg started just not acting right, wouldn't move forward and would move in and out, in and out just standing. Have had some falls due to  the leg, Saturday morning I was going around the bed and fell backwards on my butt. Up and moving. Dr. Veto Kemps also sent me to the neurologist but I couldn't get in until April 28th, trying to see if we can get in with another one sooner.   PERTINENT HISTORY: See above  PAIN:  Are you having pain? No 0/10  PRECAUTIONS: Fall  RED FLAGS: None   WEIGHT BEARING RESTRICTIONS: No  FALLS:  Has patient fallen in last 6 months? Yes. Number of falls 1 on Saturday, no FOF   LIVING  ENVIRONMENT: Lives with: lives with their family Lives in: House/apartment- townhouse  Stairs: No Has following equipment at home: Environmental consultant - 2 wheeled and Wheelchair (manual)  OCCUPATION: not working   PLOF: Independent with gait, Independent with transfers, and Requires assistive device for independence  PATIENT GOALS: to be able to get around on my own completely, be more independent   NEXT MD VISIT: Dr. Veto Kemps in another month or so, neurologist April 28th  OBJECTIVE:  Note: Objective measures were completed at Evaluation unless otherwise noted.  DIAGNOSTIC FINDINGS:     Narrative & Impression CLINICAL DATA:  Vertigo, peripheral. Right thalamic infarct in February of 2023   EXAM: CT HEAD WITHOUT CONTRAST   TECHNIQUE: Contiguous axial images were obtained from the base of the skull through the vertex without intravenous contrast.   RADIATION DOSE REDUCTION: This exam was performed according to the departmental dose-optimization program which includes automated exposure control, adjustment of the mA and/or kV according to patient size and/or use of iterative reconstruction technique.   COMPARISON:  01/24/2022   FINDINGS: Brain: No evidence of acute infarction, hemorrhage, hydrocephalus, extra-axial collection or mass lesion/mass effect. Multiple chronic bilateral basal ganglia and thalamic lacunar infarcts. Left frontal lobe encephalomalacia. Extensive low-density changes within the periventricular and subcortical white matter compatible with chronic microvascular ischemic change. Mild diffuse cerebral volume loss.   Vascular: No hyperdense vessel or unexpected calcification.   Skull: Prior left temporal craniotomy. No calvarial fracture or suspicious lesion.   Sinuses/Orbits: No acute finding.   Other: None.   IMPRESSION: 1. No acute intracranial abnormality. 2. Chronic microvascular ischemic change and cerebral volume loss.  PATIENT SURVEYS:  ABC scale  76.3%  COGNITION: Overall cognitive status: Within functional limits for tasks assessed     SENSATION: Not tested hx of neuropathy     COORDINATION  Impaired coordination R LE, possibly chronic since CVA       LOWER EXTREMITY MMT:  MMT Right eval Left eval  Hip flexion 3 4+  Hip extension    Hip abduction 4 5  Hip adduction    Hip internal rotation    Hip external rotation    Knee flexion 4+ 5  Knee extension 4+ 5  Ankle dorsiflexion 4+ 4+  Ankle plantarflexion    Ankle inversion    Ankle eversion     (Blank rows = not tested)   FUNCTIONAL TESTS:  5 times sit to stand: 13 seconds use of BUEs on chair, posterior unsteadiness/poor eccentric control  Berg: 29/56 3 minute walk test: 466ft SPC   GAIT: Distance walked: 452ft Assistive device utilized: Single point cane Level of assistance: Modified independence Comments: chronic post-stroke impairments noted, limited WB RLE, limited ankle DF and knee ROM during gait cycle  TREATMENT DATE:   01/17/24  Scifit bike L5 x8 minutes for w/u, tissue perfusion, reciprocal motion promotion   Bridges 2x15  Sidelying clams green TB x15 B STS + green TB around knees x12 for LE strengthening/endurance   Wide tandem stance blue foam pad 3x30 seconds B min guard  SLS with one foot on soft surface of BOSU/other on solid surface 3x30 B seconds min guard  Step ups on blue foam pad x3 each min guard, difficulty clearing            01/15/24   Eval, POC, HEP  Bridges x10  Standing hip ABD red TB above knees x10  Discussed/demonstrated tandem stance in corner /chair in front     PATIENT EDUCATION:  Education details: exam, POC, HEP  Person educated: Patient Education method: Programmer, multimedia, Facilities manager, and Handouts Education comprehension: verbalized understanding, returned  demonstration, and needs further education  HOME EXERCISE PROGRAM: Access Code: [email or web address] URL: [email or web address] Date: 01/15/2024 Prepared by: Nedra Hai  Exercises - Supine Bridge  - 1 x daily - 7 x weekly - 2 sets - 10 reps - 2 seconds  hold - Standing Hip Abduction with Resistance at Thighs  - 1 x daily - 7 x weekly - 2 sets - 10 reps - 1 second hold - Tandem Stance in Corner  - 1 x daily - 7 x weekly - 2 sets - 3 reps - 30 seconds  hold  ASSESSMENT:  CLINICAL IMPRESSION:   Pt arrives today doing OK, no big changes since eval. Focused on functional balance training and coordination today, also spent some time on functional strengthening especially for weak LE. Very motivated to improve, will continue to challenge him as appropriate.      EVAL: Patient is a 63 y.o. M who was seen today for physical therapy evaluation and treatment for Diagnosis R29.898 (ICD-10-CM) - Right leg weakness Z86.73 (ICD-10-CM) - History of CVA (cerebrovascular accident). RLE is slightly weaker than the L with targeted MMT but appropriate given ongoing impairments related to chronic CVA. Of more concern is general balance and coordination- didn't score well on Berg and is definitely a high fall risk. Will benefit from skilled PT services to address all findings and assist in return to optimal level of function.   OBJECTIVE IMPAIRMENTS: Abnormal gait, decreased activity tolerance, decreased balance, decreased knowledge of use of DME, decreased mobility, difficulty walking, and decreased strength.   ACTIVITY LIMITATIONS: sitting, standing, squatting, stairs, transfers, and locomotion level  PARTICIPATION LIMITATIONS: driving, shopping, community activity, occupation, and yard work  PERSONAL FACTORS: Age, Fitness, Past/current experiences, Social background, and Time since onset of injury/illness/exacerbation are also affecting patient's functional outcome.   REHAB POTENTIAL:  Good  CLINICAL DECISION MAKING: Stable/uncomplicated  EVALUATION COMPLEXITY: Low   GOALS: Goals reviewed with patient? No  SHORT TERM GOALS: Target date: 02/05/2024   Will be compliant with appropriate progressive HEP  Baseline: Goal status: INITIAL  2.  Will score at least 35 on Berg to show improved balance  Baseline:  Goal status: INITIAL  3.  Will be able to name 3 ways to prevent fall at home and in the community  Baseline:  Goal status: INITIAL    LONG TERM GOALS: Target date: 02/26/2024    MMT to have improved by one grade in all weak groups Baseline:  Goal status: INITIAL  2.  Will score at least 42 on Berg to show reduced fall risk  Baseline:  Goal status: INITIAL  3.  Will be  able to perform all daily dressing/self care activities on an independent basis without LOB  Baseline:  Goal status: INITIAL  4.  Will be able to ambulate at least 572ft in with LRAD to show improved community access, fatigue no more than 3/10 Baseline:  Goal status: INITIAL  5.  Will improve ABC score by at least 10 points to show improved subjective function  Baseline:  Goal status: INITIAL     PLAN:  PT FREQUENCY: 2x/week  PT DURATION: 6 weeks  PLANNED INTERVENTIONS: 97164- PT Re-evaluation, 97110-Therapeutic exercises, 97530- Therapeutic activity, [id number]- Neuromuscular re-education, 97535- Self Care, 16109- Gait training, and [phone]- Aquatic Therapy  PLAN FOR NEXT SESSION: heavy focus on balance, strengthening as able, general conditioning. Work on Engineering geologist over obstacles   Nedra Hai, PT, DPT 01/17/24 1:42 PM  For all possible CPT codes, reference the Planned Interventions line above.     Check all conditions that are expected to impact treatment: {Conditions expected to impact treatment:Morbid obesity, Diabetes mellitus, Neurological condition and/or seizures, and Social determinants of health   If treatment provided at initial evaluation, no treatment  charged due to lack of authorization.

## 2024-01-18 NOTE — Therapy (Signed)
 OUTPATIENT PHYSICAL THERAPY LOWER EXTREMITY TREATMENT    Patient Name: Kenneth [name] MRN: [id number] DOB:1961-05-31, 63 y.o., male Today's Date: 01/21/2024  END OF SESSION:  PT End of Session - 01/21/24 1013     Visit Number 3    Number of Visits 13    Date for PT Re-Evaluation 02/26/24    Authorization Type [employer] MCD    Authorization Time Period 01/15/24 to 02/26/24    Authorization - Number of Visits 27   combined per year   PT Start Time 1015    PT Stop Time 1100    PT Time Calculation (min) 45 min    Activity Tolerance Patient tolerated treatment well    Behavior During Therapy [facility] for tasks assessed/performed               Past Medical History:  Diagnosis Date   Alcohol dependence (HCC)    Chronic calcific pancreatitis (HCC)    CVA (cerebral vascular accident) (HCC) 01/24/2022   Diabetes mellitus without complication (HCC)    Hyperlipidemia    Hypertension    Melanoma (HCC) 2013   metastatic - resected and cured with chemo/immuno tx   Pancreatic cyst-mass?    Polyneuropathy    Past Surgical History:  Procedure Laterality Date   BIOPSY  02/03/2022   Procedure: BIOPSY;  Surgeon: Iva Boop, MD;  Location: [facility] ENDOSCOPY;  Service: Gastroenterology;;   BRAIN SURGERY  2015   to check for possible malignancy from melanoma, result were scar tissue   COLONOSCOPY WITH PROPOFOL N/A 02/03/2022   Procedure: COLONOSCOPY WITH PROPOFOL;  Surgeon: Iva Boop, MD;  Location: [facility] ENDOSCOPY;  Service: Gastroenterology;  Laterality: N/A;   ESOPHAGOGASTRODUODENOSCOPY (EGD) WITH PROPOFOL N/A 02/03/2022   Procedure: ESOPHAGOGASTRODUODENOSCOPY (EGD) WITH PROPOFOL;  Surgeon: Iva Boop, MD;  Location: [facility] ENDOSCOPY;  Service: Gastroenterology;  Laterality: N/A;   melanoma exicison     right leg   Patient Active Problem List   Diagnosis Date Noted   Peripheral neuropathy 08/22/2023   Osteoarthritis of left knee 08/22/2023   BPV (benign positional vertigo) 02/06/2023    Seborrhea 11/07/2022   History of melanoma 04/12/2022   Alcohol dependence in remission (HCC) 04/12/2022   Aortic atherosclerosis (HCC) 04/12/2022   Alcohol-induced chronic pancreatitis (HCC) 03/15/2022   History of CVA (cerebrovascular accident) 02/28/2022   Gastritis and gastroduodenitis    DNR (do not resuscitate) 02/01/2022   Vertigo 01/24/2022   Elevated PSA 11/24/2021   History of immunotherapy 11/25/2019   Melanoma metastatic to brain (HCC) 08/08/2016   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HCC) 08/13/2014   Metastatic melanoma to lung (HCC) 12/12/2012   Type 2 diabetes mellitus (HCC) 08/20/2007   Dyslipidemia 08/20/2007   Essential hypertension 08/19/2007    PCP: Loyola Mast, MD  REFERRING PROVIDER: Loyola Mast, MD  REFERRING DIAG: Diagnosis R29.898 (ICD-10-CM) - Right leg weakness Z86.73 (ICD-10-CM) - History of CVA (cerebrovascular accident)  THERAPY DIAG:  Unsteadiness on feet  Muscle weakness (generalized)  Difficulty in walking, not elsewhere classified  History of falling  Rationale for Evaluation and Treatment: Rehabilitation  ONSET DATE: a couple of weeks ago   SUBJECTIVE:   SUBJECTIVE STATEMENT: Doing alright, couple weeks ago I was having trouble with my legs and feel down backwards. No falls since.   EVAL: A couple of Fridays ago my right leg started just not acting right, wouldn't move forward and would move in and out, in and out just standing. Have had some falls due to the leg,  Saturday morning I was going around the bed and fell backwards on my butt. Up and moving. Dr. Veto Kemps also sent me to the neurologist but I couldn't get in until April 28th, trying to see if we can get in with another one sooner.   PERTINENT HISTORY: See above  PAIN:  Are you having pain? No 0/10  PRECAUTIONS: Fall  RED FLAGS: None   WEIGHT BEARING RESTRICTIONS: No  FALLS:  Has patient fallen in last 6 months? Yes. Number of falls 1 on Saturday, no FOF   LIVING  ENVIRONMENT: Lives with: lives with their family Lives in: House/apartment- townhouse  Stairs: No Has following equipment at home: Environmental consultant - 2 wheeled and Wheelchair (manual)  OCCUPATION: not working   PLOF: Independent with gait, Independent with transfers, and Requires assistive device for independence  PATIENT GOALS: to be able to get around on my own completely, be more independent   NEXT MD VISIT: Dr. Veto Kemps in another month or so, neurologist April 28th  OBJECTIVE:  Note: Objective measures were completed at Evaluation unless otherwise noted.  DIAGNOSTIC FINDINGS:     Narrative & Impression CLINICAL DATA:  Vertigo, peripheral. Right thalamic infarct in February of 2023   EXAM: CT HEAD WITHOUT CONTRAST   TECHNIQUE: Contiguous axial images were obtained from the base of the skull through the vertex without intravenous contrast.   RADIATION DOSE REDUCTION: This exam was performed according to the departmental dose-optimization program which includes automated exposure control, adjustment of the mA and/or kV according to patient size and/or use of iterative reconstruction technique.   COMPARISON:  01/24/2022   FINDINGS: Brain: No evidence of acute infarction, hemorrhage, hydrocephalus, extra-axial collection or mass lesion/mass effect. Multiple chronic bilateral basal ganglia and thalamic lacunar infarcts. Left frontal lobe encephalomalacia. Extensive low-density changes within the periventricular and subcortical white matter compatible with chronic microvascular ischemic change. Mild diffuse cerebral volume loss.   Vascular: No hyperdense vessel or unexpected calcification.   Skull: Prior left temporal craniotomy. No calvarial fracture or suspicious lesion.   Sinuses/Orbits: No acute finding.   Other: None.   IMPRESSION: 1. No acute intracranial abnormality. 2. Chronic microvascular ischemic change and cerebral volume loss.  PATIENT SURVEYS:  ABC scale  76.3%  COGNITION: Overall cognitive status: Within functional limits for tasks assessed     SENSATION: Not tested hx of neuropathy     COORDINATION  Impaired coordination R LE, possibly chronic since CVA       LOWER EXTREMITY MMT:  MMT Right eval Left eval  Hip flexion 3 4+  Hip extension    Hip abduction 4 5  Hip adduction    Hip internal rotation    Hip external rotation    Knee flexion 4+ 5  Knee extension 4+ 5  Ankle dorsiflexion 4+ 4+  Ankle plantarflexion    Ankle inversion    Ankle eversion     (Blank rows = not tested)   FUNCTIONAL TESTS:  5 times sit to stand: 13 seconds use of BUEs on chair, posterior unsteadiness/poor eccentric control  Berg: 29/56 3 minute walk test: 4110ft SPC   GAIT: Distance walked: 463ft Assistive device utilized: Single point cane Level of assistance: Modified independence Comments: chronic post-stroke impairments noted, limited WB RLE, limited ankle DF and knee ROM during gait cycle  TREATMENT DATE:  01/21/24 Bike L4 x12mins  Step ups 6" at stairs holding rails   In Bars:  -SLS and tandem standing in bars -Walking on beam  -[facility] taps   STS 2x10 Leg ext 5# 2x10 HS curls 15# 2x10  01/17/24 Scifit bike L5 x8 minutes for w/u, tissue perfusion, reciprocal motion promotion   Bridges 2x15  Sidelying clams green TB x15 B STS + green TB around knees x12 for LE strengthening/endurance   Wide tandem stance blue foam pad 3x30 seconds B min guard  SLS with one foot on soft surface of BOSU/other on solid surface 3x30 B seconds min guard  Step ups on blue foam pad x3 each min guard, difficulty clearing     01/15/24 Eval, POC, HEP  Bridges x10  Standing hip ABD red TB above knees x10  Discussed/demonstrated tandem stance in corner /chair in front     PATIENT EDUCATION:  Education details:  exam, POC, HEP  Person educated: Patient Education method: Programmer, multimedia, Facilities manager, and Handouts Education comprehension: verbalized understanding, returned demonstration, and needs further education  HOME EXERCISE PROGRAM: Access Code: [email or web address] URL: [email or web address] Date: 01/15/2024 Prepared by: Nedra Hai  Exercises - Supine Bridge  - 1 x daily - 7 x weekly - 2 sets - 10 reps - 2 seconds  hold - Standing Hip Abduction with Resistance at Thighs  - 1 x daily - 7 x weekly - 2 sets - 10 reps - 1 second hold - Tandem Stance in Corner  - 1 x daily - 7 x weekly - 2 sets - 3 reps - 30 seconds  hold  ASSESSMENT:  CLINICAL IMPRESSION: Pt arrives today doing fine, no big changes since eval. Focused on functional balance training and coordination today, also spent some time on functional strengthening especially for weak LE. He is motivated to improve, will continue to challenge him as appropriate. He requires UE assistance with all activities in the parallel bars. Could use more practice with balance. Cues needed with STS to avoid legs hitting table and to shift weight off of heels.   EVAL: Patient is a 63 y.o. M who was seen today for physical therapy evaluation and treatment for Diagnosis R29.898 (ICD-10-CM) - Right leg weakness Z86.73 (ICD-10-CM) - History of CVA (cerebrovascular accident). RLE is slightly weaker than the L with targeted MMT but appropriate given ongoing impairments related to chronic CVA. Of more concern is general balance and coordination- didn't score well on Berg and is definitely a high fall risk. Will benefit from skilled PT services to address all findings and assist in return to optimal level of function.   OBJECTIVE IMPAIRMENTS: Abnormal gait, decreased activity tolerance, decreased balance, decreased knowledge of use of DME, decreased mobility, difficulty walking, and decreased strength.   ACTIVITY LIMITATIONS: sitting, standing, squatting,  stairs, transfers, and locomotion level  PARTICIPATION LIMITATIONS: driving, shopping, community activity, occupation, and yard work  PERSONAL FACTORS: Age, Fitness, Past/current experiences, Social background, and Time since onset of injury/illness/exacerbation are also affecting patient's functional outcome.   REHAB POTENTIAL: Good  CLINICAL DECISION MAKING: Stable/uncomplicated  EVALUATION COMPLEXITY: Low   GOALS: Goals reviewed with patient? No  SHORT TERM GOALS: Target date: 02/05/2024   Will be compliant with appropriate progressive HEP  Baseline: Goal status: INITIAL  2.  Will score at least 35 on Berg to show improved balance  Baseline:  Goal status: INITIAL  3.  Will be able to name 3 ways to prevent fall at home and in the community  Baseline:  Goal status: INITIAL    LONG TERM GOALS: Target date: 02/26/2024    MMT to have improved by one grade in all weak groups Baseline:  Goal status: INITIAL  2.  Will score at least 42 on Berg to show reduced fall risk  Baseline:  Goal status: INITIAL  3.  Will be able to perform all daily dressing/self care activities on an independent basis without LOB  Baseline:  Goal status: INITIAL  4.  Will be able to ambulate at least 529ft in with LRAD to show improved community access, fatigue no more than 3/10 Baseline:  Goal status: INITIAL  5.  Will improve ABC score by at least 10 points to show improved subjective function  Baseline:  Goal status: INITIAL     PLAN:  PT FREQUENCY: 2x/week  PT DURATION: 6 weeks  PLANNED INTERVENTIONS: 97164- PT Re-evaluation, 97110-Therapeutic exercises, 97530- Therapeutic activity, [id number]- Neuromuscular re-education, 97535- Self Care, 40981- Gait training, and [phone]- Aquatic Therapy  PLAN FOR NEXT SESSION: heavy focus on balance, strengthening as able, general conditioning. Work on Engineering geologist over obstacles   Nedra Hai, PT, DPT 01/21/24 10:59 AM  For all possible  CPT codes, reference the Planned Interventions line above.     Check all conditions that are expected to impact treatment: {Conditions expected to impact treatment:Morbid obesity, Diabetes mellitus, Neurological condition and/or seizures, and Social determinants of health   If treatment provided at initial evaluation, no treatment charged due to lack of authorization.

## 2024-01-21 ENCOUNTER — Ambulatory Visit: Payer: Medicaid Other

## 2024-01-21 DIAGNOSIS — Z9181 History of falling: Secondary | ICD-10-CM

## 2024-01-21 DIAGNOSIS — R2681 Unsteadiness on feet: Secondary | ICD-10-CM

## 2024-01-21 DIAGNOSIS — R262 Difficulty in walking, not elsewhere classified: Secondary | ICD-10-CM

## 2024-01-21 DIAGNOSIS — M6281 Muscle weakness (generalized): Secondary | ICD-10-CM

## 2024-01-24 ENCOUNTER — Ambulatory Visit: Payer: Medicaid Other | Admitting: Physical Therapy

## 2024-01-25 ENCOUNTER — Encounter: Payer: Self-pay | Admitting: Physical Therapy

## 2024-01-25 ENCOUNTER — Ambulatory Visit: Payer: Medicaid Other | Admitting: Physical Therapy

## 2024-01-25 DIAGNOSIS — M6281 Muscle weakness (generalized): Secondary | ICD-10-CM

## 2024-01-25 DIAGNOSIS — Z9181 History of falling: Secondary | ICD-10-CM

## 2024-01-25 DIAGNOSIS — R2681 Unsteadiness on feet: Secondary | ICD-10-CM | POA: Diagnosis not present

## 2024-01-25 DIAGNOSIS — R262 Difficulty in walking, not elsewhere classified: Secondary | ICD-10-CM

## 2024-01-25 NOTE — Therapy (Signed)
 OUTPATIENT PHYSICAL THERAPY LOWER EXTREMITY TREATMENT    Patient Name: Kenneth [name] MRN: [id number] DOB:Dec 08, 1960, 63 y.o., male Today's Date: 01/25/2024  END OF SESSION:  PT End of Session - 01/25/24 1020     Visit Number 4    Number of Visits 13    Date for PT Re-Evaluation 02/26/24    Authorization Type [employer] MCD    Authorization Time Period 01/15/24 to 02/26/24    PT Start Time 1018    PT Stop Time 1105    PT Time Calculation (min) 47 min    Activity Tolerance Patient tolerated treatment well    Behavior During Therapy [facility] for tasks assessed/performed               Past Medical History:  Diagnosis Date   Alcohol dependence (HCC)    Chronic calcific pancreatitis (HCC)    CVA (cerebral vascular accident) (HCC) 01/24/2022   Diabetes mellitus without complication (HCC)    Hyperlipidemia    Hypertension    Melanoma (HCC) 2013   metastatic - resected and cured with chemo/immuno tx   Pancreatic cyst-mass?    Polyneuropathy    Past Surgical History:  Procedure Laterality Date   BIOPSY  02/03/2022   Procedure: BIOPSY;  Surgeon: Iva Boop, MD;  Location: [facility] ENDOSCOPY;  Service: Gastroenterology;;   BRAIN SURGERY  2015   to check for possible malignancy from melanoma, result were scar tissue   COLONOSCOPY WITH PROPOFOL N/A 02/03/2022   Procedure: COLONOSCOPY WITH PROPOFOL;  Surgeon: Iva Boop, MD;  Location: [facility] ENDOSCOPY;  Service: Gastroenterology;  Laterality: N/A;   ESOPHAGOGASTRODUODENOSCOPY (EGD) WITH PROPOFOL N/A 02/03/2022   Procedure: ESOPHAGOGASTRODUODENOSCOPY (EGD) WITH PROPOFOL;  Surgeon: Iva Boop, MD;  Location: [facility] ENDOSCOPY;  Service: Gastroenterology;  Laterality: N/A;   melanoma exicison     right leg   Patient Active Problem List   Diagnosis Date Noted   Peripheral neuropathy 08/22/2023   Osteoarthritis of left knee 08/22/2023   BPV (benign positional vertigo) 02/06/2023   Seborrhea 11/07/2022   History of melanoma 04/12/2022    Alcohol dependence in remission (HCC) 04/12/2022   Aortic atherosclerosis (HCC) 04/12/2022   Alcohol-induced chronic pancreatitis (HCC) 03/15/2022   History of CVA (cerebrovascular accident) 02/28/2022   Gastritis and gastroduodenitis    DNR (do not resuscitate) 02/01/2022   Vertigo 01/24/2022   Elevated PSA 11/24/2021   History of immunotherapy 11/25/2019   Melanoma metastatic to brain (HCC) 08/08/2016   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HCC) 08/13/2014   Metastatic melanoma to lung (HCC) 12/12/2012   Type 2 diabetes mellitus (HCC) 08/20/2007   Dyslipidemia 08/20/2007   Essential hypertension 08/19/2007    PCP: Loyola Mast, MD  REFERRING PROVIDER: Loyola Mast, MD  REFERRING DIAG: Diagnosis R29.898 (ICD-10-CM) - Right leg weakness Z86.73 (ICD-10-CM) - History of CVA (cerebrovascular accident)  THERAPY DIAG:  Unsteadiness on feet  Muscle weakness (generalized)  Difficulty in walking, not elsewhere classified  History of falling  Rationale for Evaluation and Treatment: Rehabilitation  ONSET DATE: a couple of weeks ago   SUBJECTIVE:   SUBJECTIVE STATEMENT: No recent fall I just move slow and uncoordinated  EVAL: A couple of Fridays ago my right leg started just not acting right, wouldn't move forward and would move in and out, in and out just standing. Have had some falls due to the leg, Saturday morning I was going around the bed and fell backwards on my butt. Up and moving. Dr. Veto Kemps also sent me to the  neurologist but I couldn't get in until April 28th, trying to see if we can get in with another one sooner.   PERTINENT HISTORY: See above  PAIN:  Are you having pain? No 0/10  PRECAUTIONS: Fall  RED FLAGS: None   WEIGHT BEARING RESTRICTIONS: No  FALLS:  Has patient fallen in last 6 months? Yes. Number of falls 1 on Saturday, no FOF   LIVING ENVIRONMENT: Lives with: lives with their family Lives in: House/apartment- townhouse  Stairs: No Has following equipment at  home: Environmental consultant - 2 wheeled and Wheelchair (manual)  OCCUPATION: not working   PLOF: Independent with gait, Independent with transfers, and Requires assistive device for independence  PATIENT GOALS: to be able to get around on my own completely, be more independent   NEXT MD VISIT: Dr. Veto Kemps in another month or so, neurologist April 28th  OBJECTIVE:  Note: Objective measures were completed at Evaluation unless otherwise noted.  DIAGNOSTIC FINDINGS:     Narrative & Impression CLINICAL DATA:  Vertigo, peripheral. Right thalamic infarct in February of 2023   EXAM: CT HEAD WITHOUT CONTRAST   TECHNIQUE: Contiguous axial images were obtained from the base of the skull through the vertex without intravenous contrast.   RADIATION DOSE REDUCTION: This exam was performed according to the departmental dose-optimization program which includes automated exposure control, adjustment of the mA and/or kV according to patient size and/or use of iterative reconstruction technique.   COMPARISON:  01/24/2022   FINDINGS: Brain: No evidence of acute infarction, hemorrhage, hydrocephalus, extra-axial collection or mass lesion/mass effect. Multiple chronic bilateral basal ganglia and thalamic lacunar infarcts. Left frontal lobe encephalomalacia. Extensive low-density changes within the periventricular and subcortical white matter compatible with chronic microvascular ischemic change. Mild diffuse cerebral volume loss.   Vascular: No hyperdense vessel or unexpected calcification.   Skull: Prior left temporal craniotomy. No calvarial fracture or suspicious lesion.   Sinuses/Orbits: No acute finding.   Other: None.   IMPRESSION: 1. No acute intracranial abnormality. 2. Chronic microvascular ischemic change and cerebral volume loss.  PATIENT SURVEYS:  ABC scale 76.3%  COGNITION: Overall cognitive status: Within functional limits for tasks assessed     SENSATION: Not tested hx of  neuropathy     COORDINATION  Impaired coordination R LE, possibly chronic since CVA       LOWER EXTREMITY MMT:  MMT Right eval Left eval  Hip flexion 3 4+  Hip extension    Hip abduction 4 5  Hip adduction    Hip internal rotation    Hip external rotation    Knee flexion 4+ 5  Knee extension 4+ 5  Ankle dorsiflexion 4+ 4+  Ankle plantarflexion    Ankle inversion    Ankle eversion     (Blank rows = not tested)   FUNCTIONAL TESTS:  5 times sit to stand: 13 seconds use of BUEs on chair, posterior unsteadiness/poor eccentric control  Berg: 29/56 3 minute walk test: 46ft SPC   GAIT: Distance walked: 4107ft Assistive device utilized: Single point cane Level of assistance: Modified independence Comments: chronic post-stroke impairments noted, limited WB RLE, limited ankle DF and knee ROM during gait cycle  TREATMENT DATE:  01/25/24 Nustep level 5 x 7 minutes Leg curls 25# 2x10 Leg extension 5# 2x10 On airex reaching for numbers on wall On airex cone toe touched but requiring HHA Step turn reach [employer] ball toss 9# single arm farmer carry one lap with it in each hand  01/21/24 Bike L4 x26mins  Step ups 6" at stairs holding rails   In Bars:  -SLS and tandem standing in bars -Walking on beam  -[place]one taps   STS 2x10 Leg ext 5# 2x10 HS curls 15# 2x10  01/17/24 Scifit bike L5 x8 minutes for w/u, tissue perfusion, reciprocal motion promotion   Bridges 2x15  Sidelying clams green TB x15 B STS + green TB around knees x12 for LE strengthening/endurance   Wide tandem stance blue foam pad 3x30 seconds B min guard  SLS with one foot on soft surface of BOSU/other on solid surface 3x30 B seconds min guard  Step ups on blue foam pad x3 each min guard, difficulty clearing     01/15/24 Eval, POC,  HEP  Bridges x10  Standing hip ABD red TB above knees x10  Discussed/demonstrated tandem stance in corner /chair in front     PATIENT EDUCATION:  Education details: exam, POC, HEP  Person educated: Patient Education method: Programmer, multimedia, Facilities manager, and Handouts Education comprehension: verbalized understanding, returned demonstration, and needs further education  HOME EXERCISE PROGRAM: Access Code: [email or web address] URL: [email or web address] Date: 01/15/2024 Prepared by: Nedra Hai  Exercises - Supine Bridge  - 1 x daily - 7 x weekly - 2 sets - 10 reps - 2 seconds  hold - Standing Hip Abduction with Resistance at Thighs  - 1 x daily - 7 x weekly - 2 sets - 10 reps - 1 second hold - Tandem Stance in Corner  - 1 x daily - 7 x weekly - 2 sets - 3 reps - 30 seconds  hold  ASSESSMENT:  CLINICAL IMPRESSION: Focused on functional balance training and coordination today, also spent some time on functional strengthening especially for weak LE. I worked to challenge him trying to get him to get outside his normal BOS and comfort range.  He struggled with ball toss if he had to look up, tended to lose balance to the back.  He had reported having some difficulty carrying items at home so we tried walking ball toss and farmer carry so he did not have the [facility], he did well .  He reported feeling tired when done today, told him to take it easy so he recovers.  EVAL: Patient is a 63 y.o. M who was seen today for physical therapy evaluation and treatment for Diagnosis R29.898 (ICD-10-CM) - Right leg weakness Z86.73 (ICD-10-CM) - History of CVA (cerebrovascular accident). RLE is slightly weaker than the L with targeted MMT but appropriate given ongoing impairments related to chronic CVA. Of more concern is general balance and coordination- didn't score well on Berg and is definitely a high fall risk. Will benefit from skilled PT services to address all findings and assist in return to optimal  level of function.   OBJECTIVE IMPAIRMENTS: Abnormal gait, decreased activity tolerance, decreased balance, decreased knowledge of use of DME, decreased mobility, difficulty walking, and decreased strength.   ACTIVITY LIMITATIONS: sitting, standing, squatting, stairs, transfers, and locomotion level  PARTICIPATION LIMITATIONS: driving, shopping, community activity, occupation, and yard work  PERSONAL FACTORS: Age, Fitness, Past/current experiences, Social background, and Time since onset of injury/illness/exacerbation are also affecting patient's functional  outcome.   REHAB POTENTIAL: Good  CLINICAL DECISION MAKING: Stable/uncomplicated  EVALUATION COMPLEXITY: Low   GOALS: Goals reviewed with patient? No  SHORT TERM GOALS: Target date: 02/05/2024   Will be compliant with appropriate progressive HEP  Baseline: Goal status: progressing 01/25/24  2.  Will score at least 35 on Berg to show improved balance  Baseline:  Goal status: INITIAL  3.  Will be able to name 3 ways to prevent fall at home and in the community  Baseline:  Goal status: INITIAL    LONG TERM GOALS: Target date: 02/26/2024    MMT to have improved by one grade in all weak groups Baseline:  Goal status: INITIAL  2.  Will score at least 42 on Berg to show reduced fall risk  Baseline:  Goal status: INITIAL  3.  Will be able to perform all daily dressing/self care activities on an independent basis without LOB  Baseline:  Goal status: INITIAL  4.  Will be able to ambulate at least 566ft in with LRAD to show improved community access, fatigue no more than 3/10 Baseline:  Goal status: INITIAL  5.  Will improve ABC score by at least 10 points to show improved subjective function  Baseline:  Goal status: INITIAL     PLAN:  PT FREQUENCY: 2x/week  PT DURATION: 6 weeks  PLANNED INTERVENTIONS: 97164- PT Re-evaluation, 97110-Therapeutic exercises, 97530- Therapeutic activity, [id number]- Neuromuscular  re-education, 97535- Self Care, [id number]- Gait training, and [phone]- Aquatic Therapy  PLAN FOR NEXT SESSION: heavy focus on balance, strengthening as able, general conditioning. Work on Engineering geologist over obstacles   Stacie Glaze, PT 01/25/24 10:20 AM  For all possible CPT codes, reference the Planned Interventions line above.     Check all conditions that are expected to impact treatment: {Conditions expected to impact treatment:Morbid obesity, Diabetes mellitus, Neurological condition and/or seizures, and Social determinants of health   If treatment provided at initial evaluation, no treatment charged due to lack of authorization.

## 2024-01-29 ENCOUNTER — Encounter: Payer: Self-pay | Admitting: Physical Therapy

## 2024-01-29 ENCOUNTER — Ambulatory Visit: Payer: Medicaid Other | Admitting: Family Medicine

## 2024-01-29 ENCOUNTER — Ambulatory Visit: Payer: Medicaid Other | Admitting: Physical Therapy

## 2024-01-29 DIAGNOSIS — M6281 Muscle weakness (generalized): Secondary | ICD-10-CM

## 2024-01-29 DIAGNOSIS — R2681 Unsteadiness on feet: Secondary | ICD-10-CM | POA: Diagnosis not present

## 2024-01-29 DIAGNOSIS — R262 Difficulty in walking, not elsewhere classified: Secondary | ICD-10-CM

## 2024-01-29 NOTE — Therapy (Signed)
 OUTPATIENT PHYSICAL THERAPY LOWER EXTREMITY TREATMENT    Patient Name: Kenneth [name] MRN: [id number] DOB:1961/10/15, 63 y.o., male Today's Date: 01/29/2024  END OF SESSION:  PT End of Session - 01/29/24 1015     Visit Number 5    Number of Visits 13    Date for PT Re-Evaluation 02/26/24    Authorization Type [employer] MCD    Authorization Time Period 01/15/24 to 02/26/24    Authorization - Number of Visits 27   combined per year   PT Start Time 1015    PT Stop Time 1054    PT Time Calculation (min) 39 min    Activity Tolerance Patient tolerated treatment well    Behavior During Therapy [facility] for tasks assessed/performed                Past Medical History:  Diagnosis Date   Alcohol dependence (HCC)    Chronic calcific pancreatitis (HCC)    CVA (cerebral vascular accident) (HCC) 01/24/2022   Diabetes mellitus without complication (HCC)    Hyperlipidemia    Hypertension    Melanoma (HCC) 2013   metastatic - resected and cured with chemo/immuno tx   Pancreatic cyst-mass?    Polyneuropathy    Past Surgical History:  Procedure Laterality Date   BIOPSY  02/03/2022   Procedure: BIOPSY;  Surgeon: Iva Boop, MD;  Location: [facility] ENDOSCOPY;  Service: Gastroenterology;;   BRAIN SURGERY  2015   to check for possible malignancy from melanoma, result were scar tissue   COLONOSCOPY WITH PROPOFOL N/A 02/03/2022   Procedure: COLONOSCOPY WITH PROPOFOL;  Surgeon: Iva Boop, MD;  Location: [facility] ENDOSCOPY;  Service: Gastroenterology;  Laterality: N/A;   ESOPHAGOGASTRODUODENOSCOPY (EGD) WITH PROPOFOL N/A 02/03/2022   Procedure: ESOPHAGOGASTRODUODENOSCOPY (EGD) WITH PROPOFOL;  Surgeon: Iva Boop, MD;  Location: [facility] ENDOSCOPY;  Service: Gastroenterology;  Laterality: N/A;   melanoma exicison     right leg   Patient Active Problem List   Diagnosis Date Noted   Peripheral neuropathy 08/22/2023   Osteoarthritis of left knee 08/22/2023   BPV (benign positional vertigo) 02/06/2023    Seborrhea 11/07/2022   History of melanoma 04/12/2022   Alcohol dependence in remission (HCC) 04/12/2022   Aortic atherosclerosis (HCC) 04/12/2022   Alcohol-induced chronic pancreatitis (HCC) 03/15/2022   History of CVA (cerebrovascular accident) 02/28/2022   Gastritis and gastroduodenitis    DNR (do not resuscitate) 02/01/2022   Vertigo 01/24/2022   Elevated PSA 11/24/2021   History of immunotherapy 11/25/2019   Melanoma metastatic to brain (HCC) 08/08/2016   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HCC) 08/13/2014   Metastatic melanoma to lung (HCC) 12/12/2012   Type 2 diabetes mellitus (HCC) 08/20/2007   Dyslipidemia 08/20/2007   Essential hypertension 08/19/2007    PCP: Loyola Mast, MD  REFERRING PROVIDER: Loyola Mast, MD  REFERRING DIAG: Diagnosis R29.898 (ICD-10-CM) - Right leg weakness Z86.73 (ICD-10-CM) - History of CVA (cerebrovascular accident)  THERAPY DIAG:  Unsteadiness on feet  Muscle weakness (generalized)  Difficulty in walking, not elsewhere classified  Rationale for Evaluation and Treatment: Rehabilitation  ONSET DATE: a couple of weeks ago   SUBJECTIVE:   SUBJECTIVE STATEMENT:  Nothing really new, I don't fall but balance is difficult for me.     EVAL: A couple of Fridays ago my right leg started just not acting right, wouldn't move forward and would move in and out, in and out just standing. Have had some falls due to the leg, Saturday morning I was going around the  bed and fell backwards on my butt. Up and moving. Dr. Veto Kemps also sent me to the neurologist but I couldn't get in until April 28th, trying to see if we can get in with another one sooner.   PERTINENT HISTORY: See above  PAIN:  Are you having pain? Yes: NPRS scale: 5/10 Pain location: L knee  Pain description: "like it wants to crack"  Aggravating factors: keeping it straight  Relieving factors: movement     PRECAUTIONS: Fall  RED FLAGS: None   WEIGHT BEARING RESTRICTIONS: No  FALLS:   Has patient fallen in last 6 months? Yes. Number of falls 1 on Saturday, no FOF   LIVING ENVIRONMENT: Lives with: lives with their family Lives in: House/apartment- townhouse  Stairs: No Has following equipment at home: Environmental consultant - 2 wheeled and Wheelchair (manual)  OCCUPATION: not working   PLOF: Independent with gait, Independent with transfers, and Requires assistive device for independence  PATIENT GOALS: to be able to get around on my own completely, be more independent   NEXT MD VISIT: Dr. Veto Kemps in another month or so, neurologist April 28th  OBJECTIVE:  Note: Objective measures were completed at Evaluation unless otherwise noted.  DIAGNOSTIC FINDINGS:     Narrative & Impression CLINICAL DATA:  Vertigo, peripheral. Right thalamic infarct in February of 2023   EXAM: CT HEAD WITHOUT CONTRAST   TECHNIQUE: Contiguous axial images were obtained from the base of the skull through the vertex without intravenous contrast.   RADIATION DOSE REDUCTION: This exam was performed according to the departmental dose-optimization program which includes automated exposure control, adjustment of the mA and/or kV according to patient size and/or use of iterative reconstruction technique.   COMPARISON:  01/24/2022   FINDINGS: Brain: No evidence of acute infarction, hemorrhage, hydrocephalus, extra-axial collection or mass lesion/mass effect. Multiple chronic bilateral basal ganglia and thalamic lacunar infarcts. Left frontal lobe encephalomalacia. Extensive low-density changes within the periventricular and subcortical white matter compatible with chronic microvascular ischemic change. Mild diffuse cerebral volume loss.   Vascular: No hyperdense vessel or unexpected calcification.   Skull: Prior left temporal craniotomy. No calvarial fracture or suspicious lesion.   Sinuses/Orbits: No acute finding.   Other: None.   IMPRESSION: 1. No acute intracranial abnormality. 2.  Chronic microvascular ischemic change and cerebral volume loss.  PATIENT SURVEYS:  ABC scale 76.3%  COGNITION: Overall cognitive status: Within functional limits for tasks assessed     SENSATION: Not tested hx of neuropathy     COORDINATION  Impaired coordination R LE, possibly chronic since CVA       LOWER EXTREMITY MMT:  MMT Right eval Left eval  Hip flexion 3 4+  Hip extension    Hip abduction 4 5  Hip adduction    Hip internal rotation    Hip external rotation    Knee flexion 4+ 5  Knee extension 4+ 5  Ankle dorsiflexion 4+ 4+  Ankle plantarflexion    Ankle inversion    Ankle eversion     (Blank rows = not tested)   FUNCTIONAL TESTS:  5 times sit to stand: 13 seconds use of BUEs on chair, posterior unsteadiness/poor eccentric control  Berg: 29/56 3 minute walk test: 453ft SPC   GAIT: Distance walked: 438ft Assistive device utilized: Single point cane Level of assistance: Modified independence Comments: chronic post-stroke impairments noted, limited WB RLE, limited ankle DF and knee ROM during gait cycle  TREATMENT DATE:   01/29/24  Nustep L5 x8 minutes all four extremities for w/u, tissue perfusion, neural priming STS on blue foam pad x10 cues to not lean on table posteriorly, eccentric control  STS on blue foam pad + OH reach with ball x10  Standing wood choppers red weighted ball x10 B cues for trunk rotation and general wt shift Side stepping along side edge of table/over half foam rolls  x5 B alternating, cues for sequencing and foot clearance  Side stepping between half foam rolls with toe tap to the top of each with ball toss at various angles/increasing difficulty mid-way between  [employer] against table cues to correct posterior lean PRN Farmers carry with 9#, switching hands each lap x4 laps  total         01/25/24 Nustep level 5 x 7 minutes Leg curls 25# 2x10 Leg extension 5# 2x10 On airex reaching for numbers on wall On airex cone toe touched but requiring HHA Step turn reach [employer] ball toss 9# single arm farmer carry one lap with it in each hand  01/21/24 Bike L4 x54mins  Step ups 6" at stairs holding rails   In Bars:  -SLS and tandem standing in bars -Walking on beam  -[employer] taps   STS 2x10 Leg ext 5# 2x10 HS curls 15# 2x10  01/17/24 Scifit bike L5 x8 minutes for w/u, tissue perfusion, reciprocal motion promotion   Bridges 2x15  Sidelying clams green TB x15 B STS + green TB around knees x12 for LE strengthening/endurance   Wide tandem stance blue foam pad 3x30 seconds B min guard  SLS with one foot on soft surface of BOSU/other on solid surface 3x30 B seconds min guard  Step ups on blue foam pad x3 each min guard, difficulty clearing     01/15/24 Eval, POC, HEP  Bridges x10  Standing hip ABD red TB above knees x10  Discussed/demonstrated tandem stance in corner /chair in front     PATIENT EDUCATION:  Education details: exam, POC, HEP  Person educated: Patient Education method: Programmer, multimedia, Facilities manager, and Handouts Education comprehension: verbalized understanding, returned demonstration, and needs further education  HOME EXERCISE PROGRAM: Access Code: [email or web address] URL: [email or web address] Date: 01/15/2024 Prepared by: Nedra Hai  Exercises - Supine Bridge  - 1 x daily - 7 x weekly - 2 sets - 10 reps - 2 seconds  hold - Standing Hip Abduction with Resistance at Thighs  - 1 x daily - 7 x weekly - 2 sets - 10 reps - 1 second hold - Tandem Stance in Corner  - 1 x daily - 7 x weekly - 2 sets - 3 reps - 30 seconds  hold  ASSESSMENT:  CLINICAL IMPRESSION:  Pt arrives today doing well, we continued balance focus per his request. Did well today but definitely lacking some coordination  skills especially with high level tasks, will continue to challenge him. Did note some mild impairments in safety awareness today, cues for safety PRN.     EVAL: Patient is a 63 y.o. M who was seen today for physical therapy evaluation and treatment for Diagnosis R29.898 (ICD-10-CM) - Right leg weakness Z86.73 (ICD-10-CM) - History of CVA (cerebrovascular accident). RLE is slightly weaker than the L with targeted MMT but appropriate given ongoing impairments related to chronic CVA. Of more concern is general balance and coordination- didn't score well on Berg and is definitely a high fall risk. Will benefit from skilled PT services to address  all findings and assist in return to optimal level of function.   OBJECTIVE IMPAIRMENTS: Abnormal gait, decreased activity tolerance, decreased balance, decreased knowledge of use of DME, decreased mobility, difficulty walking, and decreased strength.   ACTIVITY LIMITATIONS: sitting, standing, squatting, stairs, transfers, and locomotion level  PARTICIPATION LIMITATIONS: driving, shopping, community activity, occupation, and yard work  PERSONAL FACTORS: Age, Fitness, Past/current experiences, Social background, and Time since onset of injury/illness/exacerbation are also affecting patient's functional outcome.   REHAB POTENTIAL: Good  CLINICAL DECISION MAKING: Stable/uncomplicated  EVALUATION COMPLEXITY: Low   GOALS: Goals reviewed with patient? No  SHORT TERM GOALS: Target date: 02/05/2024   Will be compliant with appropriate progressive HEP  Baseline: Goal status: progressing 01/25/24  2.  Will score at least 35 on Berg to show improved balance  Baseline:  Goal status: INITIAL  3.  Will be able to name 3 ways to prevent fall at home and in the community  Baseline:  Goal status: INITIAL    LONG TERM GOALS: Target date: 02/26/2024    MMT to have improved by one grade in all weak groups Baseline:  Goal status: INITIAL  2.  Will score at  least 42 on Berg to show reduced fall risk  Baseline:  Goal status: INITIAL  3.  Will be able to perform all daily dressing/self care activities on an independent basis without LOB  Baseline:  Goal status: INITIAL  4.  Will be able to ambulate at least 523ft in with LRAD to show improved community access, fatigue no more than 3/10 Baseline:  Goal status: INITIAL  5.  Will improve ABC score by at least 10 points to show improved subjective function  Baseline:  Goal status: INITIAL     PLAN:  PT FREQUENCY: 2x/week  PT DURATION: 6 weeks  PLANNED INTERVENTIONS: 97164- PT Re-evaluation, 97110-Therapeutic exercises, 97530- Therapeutic activity, [id number]- Neuromuscular re-education, 97535- Self Care, [id number]- Gait training, and [phone]- Aquatic Therapy  PLAN FOR NEXT SESSION: heavy focus on balance, strengthening as able, general conditioning. Work on clearing feet over obstacles and advanced coordination  Nedra Hai, PT, DPT 01/29/24 10:54 AM   For all possible CPT codes, reference the Planned Interventions line above.     Check all conditions that are expected to impact treatment: {Conditions expected to impact treatment:Morbid obesity, Diabetes mellitus, Neurological condition and/or seizures, and Social determinants of health   If treatment provided at initial evaluation, no treatment charged due to lack of authorization.

## 2024-01-31 ENCOUNTER — Encounter: Payer: Self-pay | Admitting: Physical Therapy

## 2024-01-31 ENCOUNTER — Ambulatory Visit: Payer: Medicaid Other | Admitting: Physical Therapy

## 2024-01-31 DIAGNOSIS — R2681 Unsteadiness on feet: Secondary | ICD-10-CM

## 2024-01-31 DIAGNOSIS — Z9181 History of falling: Secondary | ICD-10-CM

## 2024-01-31 DIAGNOSIS — M6281 Muscle weakness (generalized): Secondary | ICD-10-CM

## 2024-01-31 DIAGNOSIS — R262 Difficulty in walking, not elsewhere classified: Secondary | ICD-10-CM

## 2024-01-31 NOTE — Therapy (Signed)
 OUTPATIENT PHYSICAL THERAPY LOWER EXTREMITY TREATMENT    Patient Name: Kenneth [name] MRN: [id number] DOB:August 15, 1961, 63 y.o., male Today's Date: 01/31/2024  END OF SESSION:  PT End of Session - 01/31/24 1146     Visit Number 6    Number of Visits 13    Date for PT Re-Evaluation 02/26/24    Authorization Type [employer] MCD    Authorization Time Period 01/15/24 to 02/26/24    Authorization - Number of Visits 27    PT Start Time 1147    PT Stop Time 1225    PT Time Calculation (min) 38 min    Activity Tolerance Patient tolerated treatment well    Behavior During Therapy [facility] for tasks assessed/performed                 Past Medical History:  Diagnosis Date   Alcohol dependence (HCC)    Chronic calcific pancreatitis (HCC)    CVA (cerebral vascular accident) (HCC) 01/24/2022   Diabetes mellitus without complication (HCC)    Hyperlipidemia    Hypertension    Melanoma (HCC) 2013   metastatic - resected and cured with chemo/immuno tx   Pancreatic cyst-mass?    Polyneuropathy    Past Surgical History:  Procedure Laterality Date   BIOPSY  02/03/2022   Procedure: BIOPSY;  Surgeon: Iva Boop, MD;  Location: [facility] ENDOSCOPY;  Service: Gastroenterology;;   BRAIN SURGERY  2015   to check for possible malignancy from melanoma, result were scar tissue   COLONOSCOPY WITH PROPOFOL N/A 02/03/2022   Procedure: COLONOSCOPY WITH PROPOFOL;  Surgeon: Iva Boop, MD;  Location: [name] ENDOSCOPY;  Service: Gastroenterology;  Laterality: N/A;   ESOPHAGOGASTRODUODENOSCOPY (EGD) WITH PROPOFOL N/A 02/03/2022   Procedure: ESOPHAGOGASTRODUODENOSCOPY (EGD) WITH PROPOFOL;  Surgeon: Iva Boop, MD;  Location: [facility] ENDOSCOPY;  Service: Gastroenterology;  Laterality: N/A;   melanoma exicison     right leg   Patient Active Problem List   Diagnosis Date Noted   Peripheral neuropathy 08/22/2023   Osteoarthritis of left knee 08/22/2023   BPV (benign positional vertigo) 02/06/2023   Seborrhea  11/07/2022   History of melanoma 04/12/2022   Alcohol dependence in remission (HCC) 04/12/2022   Aortic atherosclerosis (HCC) 04/12/2022   Alcohol-induced chronic pancreatitis (HCC) 03/15/2022   History of CVA (cerebrovascular accident) 02/28/2022   Gastritis and gastroduodenitis    DNR (do not resuscitate) 02/01/2022   Vertigo 01/24/2022   Elevated PSA 11/24/2021   History of immunotherapy 11/25/2019   Melanoma metastatic to brain (HCC) 08/08/2016   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HCC) 08/13/2014   Metastatic melanoma to lung (HCC) 12/12/2012   Type 2 diabetes mellitus (HCC) 08/20/2007   Dyslipidemia 08/20/2007   Essential hypertension 08/19/2007    PCP: Loyola Mast, MD  REFERRING PROVIDER: Loyola Mast, MD  REFERRING DIAG: Diagnosis R29.898 (ICD-10-CM) - Right leg weakness Z86.73 (ICD-10-CM) - History of CVA (cerebrovascular accident)  THERAPY DIAG:  Unsteadiness on feet  Muscle weakness (generalized)  Difficulty in walking, not elsewhere classified  History of falling  Rationale for Evaluation and Treatment: Rehabilitation  ONSET DATE: a couple of weeks ago   SUBJECTIVE:   SUBJECTIVE STATEMENT:  Nothing new, had to do my class this morning. Felt a little sore after last time but not bad     EVAL: A couple of Fridays ago my right leg started just not acting right, wouldn't move forward and would move in and out, in and out just standing. Have had some falls due to the  leg, Saturday morning I was going around the bed and fell backwards on my butt. Up and moving. Dr. Veto Kemps also sent me to the neurologist but I couldn't get in until April 28th, trying to see if we can get in with another one sooner.   PERTINENT HISTORY: See above  PAIN:  Are you having pain? No 0/10 now  PRECAUTIONS: Fall   RED FLAGS: None   WEIGHT BEARING RESTRICTIONS: No  FALLS:  Has patient fallen in last 6 months? Yes. Number of falls 1 on Saturday, no FOF   LIVING ENVIRONMENT: Lives with:  lives with their family Lives in: House/apartment- townhouse  Stairs: No Has following equipment at home: Environmental consultant - 2 wheeled and Wheelchair (manual)  OCCUPATION: not working   PLOF: Independent with gait, Independent with transfers, and Requires assistive device for independence  PATIENT GOALS: to be able to get around on my own completely, be more independent   NEXT MD VISIT: Dr. Veto Kemps in another month or so, neurologist April 28th  OBJECTIVE:  Note: Objective measures were completed at Evaluation unless otherwise noted.  DIAGNOSTIC FINDINGS:     Narrative & Impression CLINICAL DATA:  Vertigo, peripheral. Right thalamic infarct in February of 2023   EXAM: CT HEAD WITHOUT CONTRAST   TECHNIQUE: Contiguous axial images were obtained from the base of the skull through the vertex without intravenous contrast.   RADIATION DOSE REDUCTION: This exam was performed according to the departmental dose-optimization program which includes automated exposure control, adjustment of the mA and/or kV according to patient size and/or use of iterative reconstruction technique.   COMPARISON:  01/24/2022   FINDINGS: Brain: No evidence of acute infarction, hemorrhage, hydrocephalus, extra-axial collection or mass lesion/mass effect. Multiple chronic bilateral basal ganglia and thalamic lacunar infarcts. Left frontal lobe encephalomalacia. Extensive low-density changes within the periventricular and subcortical white matter compatible with chronic microvascular ischemic change. Mild diffuse cerebral volume loss.   Vascular: No hyperdense vessel or unexpected calcification.   Skull: Prior left temporal craniotomy. No calvarial fracture or suspicious lesion.   Sinuses/Orbits: No acute finding.   Other: None.   IMPRESSION: 1. No acute intracranial abnormality. 2. Chronic microvascular ischemic change and cerebral volume loss.  PATIENT SURVEYS:  ABC scale 76.3%  COGNITION: Overall  cognitive status: Within functional limits for tasks assessed     SENSATION: Not tested hx of neuropathy     COORDINATION  Impaired coordination R LE, possibly chronic since CVA       LOWER EXTREMITY MMT:  MMT Right eval Left eval  Hip flexion 3 4+  Hip extension    Hip abduction 4 5  Hip adduction    Hip internal rotation    Hip external rotation    Knee flexion 4+ 5  Knee extension 4+ 5  Ankle dorsiflexion 4+ 4+  Ankle plantarflexion    Ankle inversion    Ankle eversion     (Blank rows = not tested)   FUNCTIONAL TESTS:  5 times sit to stand: 13 seconds use of BUEs on chair, posterior unsteadiness/poor eccentric control  Berg: 29/56 3 minute walk test: 478ft SPC   GAIT: Distance walked: 456ft Assistive device utilized: Single point cane Level of assistance: Modified independence Comments: chronic post-stroke impairments noted, limited WB RLE, limited ankle DF and knee ROM during gait cycle  TREATMENT DATE:  01/31/24   Nustep L5 x2 min warmup, then HIIT training with 4x30 second sprints, then 2 min cool down Alternating taps to 4 inch step x20 for balance and foot clearance Ipsilateral target taps on 4 inch step x2 rounds each LE  Cross midline target taps on 4 inch step x2 rounds each LE Forward step ups 6 inch step  Ipsilateral multilevel taps on 2 four inch steps x6 B         01/29/24  Nustep L5 x8 minutes all four extremities for w/u, tissue perfusion, neural priming STS on blue foam pad x10 cues to not lean on table posteriorly, eccentric control  STS on blue foam pad + OH reach with ball x10  Standing wood choppers red weighted ball x10 B cues for trunk rotation and general wt shift Side stepping along side edge of table/over half foam rolls  x5 B alternating, cues for sequencing and foot clearance  Side stepping  between half foam rolls with toe tap to the top of each with ball toss at various angles/increasing difficulty mid-way between  [employer] against table cues to correct posterior lean PRN Farmers carry with 9#, switching hands each lap x4 laps total         01/25/24 Nustep level 5 x 7 minutes Leg curls 25# 2x10 Leg extension 5# 2x10 On airex reaching for numbers on wall On airex cone toe touched but requiring HHA Step turn reach [employer] ball toss 9# single arm farmer carry one lap with it in each hand  01/21/24 Bike L4 x68mins  Step ups 6" at stairs holding rails   In Bars:  -SLS and tandem standing in bars -Walking on beam  -[employer] taps   STS 2x10 Leg ext 5# 2x10 HS curls 15# 2x10  01/17/24 Scifit bike L5 x8 minutes for w/u, tissue perfusion, reciprocal motion promotion   Bridges 2x15  Sidelying clams green TB x15 B STS + green TB around knees x12 for LE strengthening/endurance   Wide tandem stance blue foam pad 3x30 seconds B min guard  SLS with one foot on soft surface of BOSU/other on solid surface 3x30 B seconds min guard  Step ups on blue foam pad x3 each min guard, difficulty clearing     01/15/24 Eval, POC, HEP  Bridges x10  Standing hip ABD red TB above knees x10  Discussed/demonstrated tandem stance in corner /chair in front     PATIENT EDUCATION:  Education details: exam, POC, HEP  Person educated: Patient Education method: Programmer, multimedia, Facilities manager, and Handouts Education comprehension: verbalized understanding, returned demonstration, and needs further education  HOME EXERCISE PROGRAM:  Access Code: [email or web address] URL: [email or web address] Date: 01/31/2024 Prepared by: Nedra Hai  Exercises - Supine Bridge  - 1 x daily - 7 x weekly - 2 sets - 10 reps - 2 seconds  hold - Standing Hip Abduction with Resistance at Thighs  - 1 x daily - 7 x weekly - 2 sets - 10 reps - 1 second hold - Tandem  Stance in Corner  - 1 x daily - 7 x weekly - 2 sets - 3 reps - 30 seconds  hold - Forward Step Up  - 1 x daily - 5 x weekly - 1 sets - 10 reps - Standing Forward Toe Taps on Box (BKA)  - 1 x daily - 7 x weekly - 1 sets - 20 reps   ASSESSMENT:  CLINICAL IMPRESSION:  Pt arrives today doing  well, he was a little sore after last session. Continued focus on activities promoting foot clearance as well as on general coordination today. Continues to have difficulty with foot clearance especially as fatigue increases. Felt moderately challenged from today's session. Added to HEP, we are about halfway through POC- I asked him to think about his biggest mobility challenges over the coming weekend so we can focus on these more in his last few remaining sessions coming up.     EVAL: Patient is a 63 y.o. M who was seen today for physical therapy evaluation and treatment for Diagnosis R29.898 (ICD-10-CM) - Right leg weakness Z86.73 (ICD-10-CM) - History of CVA (cerebrovascular accident). RLE is slightly weaker than the L with targeted MMT but appropriate given ongoing impairments related to chronic CVA. Of more concern is general balance and coordination- didn't score well on Berg and is definitely a high fall risk. Will benefit from skilled PT services to address all findings and assist in return to optimal level of function.   OBJECTIVE IMPAIRMENTS: Abnormal gait, decreased activity tolerance, decreased balance, decreased knowledge of use of DME, decreased mobility, difficulty walking, and decreased strength.   ACTIVITY LIMITATIONS: sitting, standing, squatting, stairs, transfers, and locomotion level  PARTICIPATION LIMITATIONS: driving, shopping, community activity, occupation, and yard work  PERSONAL FACTORS: Age, Fitness, Past/current experiences, Social background, and Time since onset of injury/illness/exacerbation are also affecting patient's functional outcome.   REHAB POTENTIAL: Good  CLINICAL  DECISION MAKING: Stable/uncomplicated  EVALUATION COMPLEXITY: Low   GOALS: Goals reviewed with patient? No  SHORT TERM GOALS: Target date: 02/05/2024   Will be compliant with appropriate progressive HEP  Baseline: Goal status: progressing 01/25/24  2.  Will score at least 35 on Berg to show improved balance  Baseline:  Goal status: INITIAL  3.  Will be able to name 3 ways to prevent fall at home and in the community  Baseline:  Goal status: INITIAL    LONG TERM GOALS: Target date: 02/26/2024    MMT to have improved by one grade in all weak groups Baseline:  Goal status: INITIAL  2.  Will score at least 42 on Berg to show reduced fall risk  Baseline:  Goal status: INITIAL  3.  Will be able to perform all daily dressing/self care activities on an independent basis without LOB  Baseline:  Goal status: INITIAL  4.  Will be able to ambulate at least 556ft in with LRAD to show improved community access, fatigue no more than 3/10 Baseline:  Goal status: INITIAL  5.  Will improve ABC score by at least 10 points to show improved subjective function  Baseline:  Goal status: INITIAL     PLAN:  PT FREQUENCY: 2x/week  PT DURATION: 6 weeks  PLANNED INTERVENTIONS: 97164- PT Re-evaluation, 97110-Therapeutic exercises, 97530- Therapeutic activity, [id number]- Neuromuscular re-education, 97535- Self Care, [id number]- Gait training, and [phone]- Aquatic Therapy  PLAN FOR NEXT SESSION: heavy focus on balance, strengthening as able, general conditioning. Work on clearing feet over obstacles and advanced coordination. What are biggest challenges at home?   Nedra Hai, PT, DPT 01/31/24 12:27 PM   For all possible CPT codes, reference the Planned Interventions line above.     Check all conditions that are expected to impact treatment: {Conditions expected to impact treatment:Morbid obesity, Diabetes mellitus, Neurological condition and/or seizures, and Social determinants of  health   If treatment provided at initial evaluation, no treatment charged due to lack of authorization.

## 2024-02-04 ENCOUNTER — Emergency Department (HOSPITAL_COMMUNITY)

## 2024-02-04 ENCOUNTER — Encounter (HOSPITAL_COMMUNITY): Payer: Self-pay | Admitting: Radiology

## 2024-02-04 ENCOUNTER — Observation Stay (HOSPITAL_COMMUNITY)

## 2024-02-04 ENCOUNTER — Inpatient Hospital Stay (HOSPITAL_COMMUNITY)
Admission: EM | Admit: 2024-02-04 | Discharge: 2024-02-08 | DRG: 065 | Disposition: A | Discharge status: Rehabilitation Facility | Attending: Student | Admitting: Student

## 2024-02-04 ENCOUNTER — Other Ambulatory Visit: Payer: Self-pay

## 2024-02-04 DIAGNOSIS — R29818 Other symptoms and signs involving the nervous system: Secondary | ICD-10-CM | POA: Diagnosis present

## 2024-02-04 DIAGNOSIS — E785 Hyperlipidemia, unspecified: Secondary | ICD-10-CM | POA: Diagnosis present

## 2024-02-04 DIAGNOSIS — E1140 Type 2 diabetes mellitus with diabetic neuropathy, unspecified: Secondary | ICD-10-CM | POA: Diagnosis present

## 2024-02-04 DIAGNOSIS — Z8673 Personal history of transient ischemic attack (TIA), and cerebral infarction without residual deficits: Secondary | ICD-10-CM

## 2024-02-04 DIAGNOSIS — Z87891 Personal history of nicotine dependence: Secondary | ICD-10-CM

## 2024-02-04 DIAGNOSIS — Z85841 Personal history of malignant neoplasm of brain: Secondary | ICD-10-CM

## 2024-02-04 DIAGNOSIS — G6289 Other specified polyneuropathies: Secondary | ICD-10-CM

## 2024-02-04 DIAGNOSIS — C7800 Secondary malignant neoplasm of unspecified lung: Secondary | ICD-10-CM | POA: Diagnosis not present

## 2024-02-04 DIAGNOSIS — Z91148 Patient's other noncompliance with medication regimen for other reason: Secondary | ICD-10-CM

## 2024-02-04 DIAGNOSIS — Z8249 Family history of ischemic heart disease and other diseases of the circulatory system: Secondary | ICD-10-CM

## 2024-02-04 DIAGNOSIS — Z9221 Personal history of antineoplastic chemotherapy: Secondary | ICD-10-CM

## 2024-02-04 DIAGNOSIS — R471 Dysarthria and anarthria: Secondary | ICD-10-CM | POA: Diagnosis present

## 2024-02-04 DIAGNOSIS — E1165 Type 2 diabetes mellitus with hyperglycemia: Secondary | ICD-10-CM | POA: Diagnosis present

## 2024-02-04 DIAGNOSIS — Z808 Family history of malignant neoplasm of other organs or systems: Secondary | ICD-10-CM

## 2024-02-04 DIAGNOSIS — I639 Cerebral infarction, unspecified: Secondary | ICD-10-CM

## 2024-02-04 DIAGNOSIS — Z85820 Personal history of malignant melanoma of skin: Secondary | ICD-10-CM

## 2024-02-04 DIAGNOSIS — I6381 Other cerebral infarction due to occlusion or stenosis of small artery: Principal | ICD-10-CM | POA: Diagnosis present

## 2024-02-04 DIAGNOSIS — Z79899 Other long term (current) drug therapy: Secondary | ICD-10-CM

## 2024-02-04 DIAGNOSIS — E876 Hypokalemia: Secondary | ICD-10-CM | POA: Diagnosis present

## 2024-02-04 DIAGNOSIS — R001 Bradycardia, unspecified: Secondary | ICD-10-CM | POA: Diagnosis present

## 2024-02-04 DIAGNOSIS — C7931 Secondary malignant neoplasm of brain: Secondary | ICD-10-CM | POA: Diagnosis present

## 2024-02-04 DIAGNOSIS — I10 Essential (primary) hypertension: Secondary | ICD-10-CM | POA: Diagnosis present

## 2024-02-04 DIAGNOSIS — K2970 Gastritis, unspecified, without bleeding: Secondary | ICD-10-CM | POA: Diagnosis present

## 2024-02-04 DIAGNOSIS — Z7902 Long term (current) use of antithrombotics/antiplatelets: Secondary | ICD-10-CM

## 2024-02-04 DIAGNOSIS — Z7982 Long term (current) use of aspirin: Secondary | ICD-10-CM

## 2024-02-04 DIAGNOSIS — Z9189 Other specified personal risk factors, not elsewhere classified: Principal | ICD-10-CM

## 2024-02-04 DIAGNOSIS — E119 Type 2 diabetes mellitus without complications: Secondary | ICD-10-CM | POA: Diagnosis not present

## 2024-02-04 DIAGNOSIS — Z8042 Family history of malignant neoplasm of prostate: Secondary | ICD-10-CM

## 2024-02-04 DIAGNOSIS — R29810 Facial weakness: Secondary | ICD-10-CM | POA: Diagnosis present

## 2024-02-04 DIAGNOSIS — Z91048 Other nonmedicinal substance allergy status: Secondary | ICD-10-CM

## 2024-02-04 DIAGNOSIS — R29702 NIHSS score 2: Secondary | ICD-10-CM | POA: Diagnosis present

## 2024-02-04 DIAGNOSIS — G629 Polyneuropathy, unspecified: Secondary | ICD-10-CM

## 2024-02-04 DIAGNOSIS — Z800 Family history of malignant neoplasm of digestive organs: Secondary | ICD-10-CM

## 2024-02-04 DIAGNOSIS — C439 Malignant melanoma of skin, unspecified: Secondary | ICD-10-CM | POA: Diagnosis present

## 2024-02-04 DIAGNOSIS — G8191 Hemiplegia, unspecified affecting right dominant side: Secondary | ICD-10-CM | POA: Diagnosis present

## 2024-02-04 DIAGNOSIS — Z7984 Long term (current) use of oral hypoglycemic drugs: Secondary | ICD-10-CM

## 2024-02-04 LAB — I-STAT CHEM 8, ED
BUN: 17 mg/dL (ref 8–23)
Calcium, Ion: 1.13 mmol/L — ABNORMAL LOW (ref 1.15–1.40)
Chloride: 105 mmol/L (ref 98–111)
Creatinine, Ser: 1 mg/dL (ref 0.61–1.24)
Glucose, Bld: 134 mg/dL — ABNORMAL HIGH (ref 70–99)
HCT: 44 % (ref 39.0–52.0)
Hemoglobin: 15 g/dL (ref 13.0–17.0)
Potassium: 3.6 mmol/L (ref 3.5–5.1)
Sodium: 141 mmol/L (ref 135–145)
TCO2: 25 mmol/L (ref 22–32)

## 2024-02-04 LAB — DIFFERENTIAL
Abs Immature Granulocytes: 0.03 10*3/uL (ref 0.00–0.07)
Basophils Absolute: 0.1 10*3/uL (ref 0.0–0.1)
Basophils Relative: 1 %
Eosinophils Absolute: 0.6 10*3/uL — ABNORMAL HIGH (ref 0.0–0.5)
Eosinophils Relative: 8 %
Immature Granulocytes: 0 %
Lymphocytes Relative: 24 %
Lymphs Abs: 1.7 10*3/uL (ref 0.7–4.0)
Monocytes Absolute: 0.8 10*3/uL (ref 0.1–1.0)
Monocytes Relative: 12 %
Neutro Abs: 3.9 10*3/uL (ref 1.7–7.7)
Neutrophils Relative %: 55 %

## 2024-02-04 LAB — COMPREHENSIVE METABOLIC PANEL
ALT: 22 U/L (ref 0–44)
AST: 19 U/L (ref 15–41)
Albumin: 3.9 g/dL (ref 3.5–5.0)
Alkaline Phosphatase: 49 U/L (ref 38–126)
Anion gap: 10 (ref 5–15)
BUN: 15 mg/dL (ref 8–23)
CO2: 24 mmol/L (ref 22–32)
Calcium: 9.2 mg/dL (ref 8.9–10.3)
Chloride: 105 mmol/L (ref 98–111)
Creatinine, Ser: 0.94 mg/dL (ref 0.61–1.24)
GFR, Estimated: >60 mL/min (ref >60)
Glucose, Bld: 137 mg/dL — ABNORMAL HIGH (ref 70–99)
Potassium: 3.6 mmol/L (ref 3.5–5.1)
Sodium: 139 mmol/L (ref 135–145)
Total Bilirubin: 0.3 mg/dL (ref 0.0–1.2)
Total Protein: 7.2 g/dL (ref 6.5–8.1)

## 2024-02-04 LAB — CBC
HCT: 43.1 % (ref 39.0–52.0)
Hemoglobin: 14.8 g/dL (ref 13.0–17.0)
MCH: 29.7 pg (ref 26.0–34.0)
MCHC: 34.3 g/dL (ref 30.0–36.0)
MCV: 86.4 fL (ref 80.0–100.0)
Platelets: 266 10*3/uL (ref 150–400)
RBC: 4.99 MIL/uL (ref 4.22–5.81)
RDW: 13.6 % (ref 11.5–15.5)
WBC: 7 10*3/uL (ref 4.0–10.5)
nRBC: 0 % (ref 0.0–0.2)

## 2024-02-04 LAB — PROTIME-INR
INR: 1.1 (ref 0.8–1.2)
Prothrombin Time: 13.9 s (ref 11.4–15.2)

## 2024-02-04 LAB — GLUCOSE, CAPILLARY
Glucose-Capillary: 109 mg/dL — ABNORMAL HIGH (ref 70–99)
Glucose-Capillary: 117 mg/dL — ABNORMAL HIGH (ref 70–99)

## 2024-02-04 LAB — CBG MONITORING, ED: Glucose-Capillary: 142 mg/dL — ABNORMAL HIGH (ref 70–99)

## 2024-02-04 LAB — ETHANOL: Alcohol, Ethyl (B): <10 mg/dL (ref <10)

## 2024-02-04 LAB — APTT: aPTT: 31 s (ref 24–36)

## 2024-02-04 MED ORDER — CLOPIDOGREL BISULFATE 75 MG PO TABS
300.0000 mg | ORAL_TABLET | Freq: Once | ORAL | Status: DC
  Filled 2024-02-04: qty 4

## 2024-02-04 MED ORDER — SENNOSIDES-DOCUSATE SODIUM 8.6-50 MG PO TABS: 1.0000 | ORAL_TABLET | Freq: Every evening | ORAL | Status: DC | PRN

## 2024-02-04 MED ORDER — ACETAMINOPHEN 650 MG RE SUPP: 650.0000 mg | RECTAL | Status: DC | PRN

## 2024-02-04 MED ORDER — ASPIRIN 81 MG PO CHEW
81.0000 mg | CHEWABLE_TABLET | Freq: Every day | ORAL | Status: DC
  Administered 2024-02-05 – 2024-02-08 (×4): 81 mg via ORAL
  Filled 2024-02-04 (×5): qty 1

## 2024-02-04 MED ORDER — ACETAMINOPHEN 160 MG/5ML PO SOLN: 650.0000 mg | ORAL | Status: DC | PRN

## 2024-02-04 MED ORDER — STROKE: EARLY STAGES OF RECOVERY BOOK
Freq: Once | Status: AC
  Filled 2024-02-04: qty 1

## 2024-02-04 MED ORDER — SODIUM CHLORIDE 0.9% FLUSH
3.0000 mL | Freq: Once | INTRAVENOUS | Status: AC
  Administered 2024-02-04: 3 mL via INTRAVENOUS

## 2024-02-04 MED ORDER — TAMSULOSIN HCL 0.4 MG PO CAPS: 0.4000 mg | ORAL_CAPSULE | Freq: Every day | ORAL | Status: DC

## 2024-02-04 MED ORDER — ACETAMINOPHEN 325 MG PO TABS: 650.0000 mg | ORAL_TABLET | ORAL | Status: DC | PRN

## 2024-02-04 MED ORDER — IOHEXOL 350 MG/ML SOLN
75.0000 mL | Freq: Once | INTRAVENOUS | Status: AC | PRN
  Administered 2024-02-04: 75 mL via INTRAVENOUS

## 2024-02-04 MED ORDER — CLOPIDOGREL BISULFATE 75 MG PO TABS: 75.0000 mg | ORAL_TABLET | Freq: Every day | ORAL | Status: DC

## 2024-02-04 MED ORDER — CLOPIDOGREL BISULFATE 75 MG PO TABS
75.0000 mg | ORAL_TABLET | Freq: Every day | ORAL | Status: DC
Start: 2024-02-05 — End: 2024-02-06
  Filled 2024-02-04 (×2): qty 1

## 2024-02-04 MED ORDER — ATORVASTATIN CALCIUM 10 MG PO TABS
20.0000 mg | ORAL_TABLET | Freq: Every day | ORAL | Status: DC
  Administered 2024-02-05 – 2024-02-08 (×4): 20 mg via ORAL
  Filled 2024-02-04 (×4): qty 2

## 2024-02-04 MED ORDER — INSULIN ASPART 100 UNIT/ML IJ SOLN
0.0000 [IU] | Freq: Three times a day (TID) | INTRAMUSCULAR | Status: DC
  Administered 2024-02-05: 1 [IU] via SUBCUTANEOUS
  Administered 2024-02-06: 3 [IU] via SUBCUTANEOUS
  Administered 2024-02-06: 1 [IU] via SUBCUTANEOUS
  Administered 2024-02-07: 2 [IU] via SUBCUTANEOUS
  Administered 2024-02-07: 1 [IU] via SUBCUTANEOUS
  Administered 2024-02-07 – 2024-02-08 (×2): 2 [IU] via SUBCUTANEOUS

## 2024-02-04 NOTE — Code Documentation (Signed)
 Kenneth [name] is a 63 yr old male arriving to [facility] via POV on 02/04/2024. He has a H/O DM, prior CVA and metastatic melanoma. He is coming from home where he was last known well when he went to bed last night at 21:30. When he awoke, at 0845, he noticed his rt hand was tingling. When he spoke with someone around 11:00, he realized that he was dysarthric. He is not on any known blood thinners. Code stroke activated in triage.     Pt met by Stroke Team in CT. NIHSS 5. Pt with rt droop, rt sensory loss, and dysarthria. (Please see documentation for NIHSS details and timeline). The following imaging was obtained: CT, CTA. Per Dr. Iver Nestle, CT is negative for hemorrhage, and CTA is negative for LVO.    Pt to ED room where his workup will continue. He will need q 2 hr VS and NIHSS. He is not eligible for thrombolytics as he is outside of the treatment window. He is not a candidate for mechanical thrombectomy as he is LVO negative. Bedside handoff with ED RN complete.

## 2024-02-04 NOTE — ED Notes (Signed)
 Pt brought to CT #4 from triage.

## 2024-02-04 NOTE — ED Provider Notes (Signed)
 [facility] AT [facility] Provider Note   CSN: [id number] Arrival date & time: 02/04/24  1246     History  Chief Complaint  Patient presents with   Code Stroke    Kenneth [name] is a 63 y.o. male.  Pt is a 63 yo male with pmhx significant for dm, hld, htn, cva, metastatic melanoma, hx etoh abuse.  Pt was fine when he went to bed around 2130.  He said he woke up this morning with increased right sided weakness and difficulty speaking.  He presented via private vehicle.  A code stroke was called upon arrival.         Home Medications Prior to Admission medications   Medication Sig Start Date End Date Taking? Authorizing Provider  amLODipine-benazepril (LOTREL) 10-20 MG capsule Take 1 capsule by mouth daily. 08/22/23   Loyola Mast, MD  aspirin EC 81 MG EC tablet Take 1 tablet (81 mg total) by mouth daily. Swallow whole. 01/27/22   Haydee Monica, MD  atorvastatin (LIPITOR) 20 MG tablet TAKE 1 TABLET BY MOUTH DAILY 03/30/23   Loyola Mast, MD  glipiZIDE (GLUCOTROL XL) 5 MG 24 hr tablet Take 1 tablet (5 mg total) by mouth daily with breakfast. 01/08/24   Loyola Mast, MD  lactobacillus acidophilus (BACID) TABS tablet Take 1 tablet by mouth daily. Engineer, building services, Historical, MD  metoprolol tartrate (LOPRESSOR) 100 MG tablet TAKE 1 TABLET(100 MG) BY MOUTH TWICE DAILY 12/27/23   Loyola Mast, MD  mirtazapine (REMERON) 7.5 MG tablet TAKE 1 TABLET BY MOUTH DAILY AT BEDTIME 03/30/23   Loyola Mast, MD  Multiple Vitamin (MULTIVITAMIN WITH MINERALS) TABS tablet Take 1 tablet by mouth daily. 01/27/22   Haydee Monica, MD  pioglitazone (ACTOS) 30 MG tablet TAKE 1 TABLET(30 MG) BY MOUTH DAILY 09/12/23   Loyola Mast, MD  potassium chloride (KLOR-CON M) 20 MEQ tablet Take 1 tablet (20 mEq total) by mouth 2 (two) times daily. 01/08/24   Loyola Mast, MD  sitaGLIPtin (JANUVIA) 100 MG tablet Take 1 tablet (100 mg total) by mouth daily. 10/30/23   Loyola Mast, MD  tamsulosin (FLOMAX) 0.4 MG CAPS capsule TAKE 1 CAPSULE(0.4 MG) BY MOUTH DAILY 09/17/23   Loyola Mast, MD      Allergies    Cat dander    Review of Systems   Review of Systems  Neurological:  Positive for speech difficulty, weakness and numbness.  All other systems reviewed and are negative.   Physical Exam Updated Vital Signs BP (!) 156/76   Pulse 60   Temp 98 F (36.7 C)   Resp 18   Wt 74.8 kg   SpO2 100%   BMI 28.32 kg/m  Physical Exam Vitals and nursing note reviewed.  Constitutional:      Appearance: Normal appearance.  HENT:     Head: Normocephalic and atraumatic.     Right Ear: External ear normal.     Left Ear: External ear normal.     Nose: Nose normal.     Mouth/Throat:     Mouth: Mucous membranes are moist.  Eyes:     Extraocular Movements: Extraocular movements intact.     Conjunctiva/sclera: Conjunctivae normal.     Pupils: Pupils are equal, round, and reactive to light.  Cardiovascular:     Rate and Rhythm: Normal rate and regular rhythm.     Pulses: Normal pulses.  Heart sounds: Normal heart sounds.  Pulmonary:     Effort: Pulmonary effort is normal.     Breath sounds: Normal breath sounds.  Abdominal:     General: Abdomen is flat. Bowel sounds are normal.     Palpations: Abdomen is soft.  Musculoskeletal:        General: Normal range of motion.     Cervical back: Normal range of motion and neck supple.  Skin:    General: Skin is warm.     Capillary Refill: Capillary refill takes less than 2 seconds.  Neurological:     Mental Status: He is alert and oriented to person, place, and time.     Cranial Nerves: Dysarthria present.     Comments: Right arm weakness  Psychiatric:        Mood and Affect: Mood normal.        Behavior: Behavior normal.     ED Results / Procedures / Treatments   Labs (all labs ordered are listed, but only abnormal results are displayed) Labs Reviewed  DIFFERENTIAL - Abnormal; Notable for the  following components:      Result Value   Eosinophils Absolute 0.6 (*)    All other components within normal limits  COMPREHENSIVE METABOLIC PANEL - Abnormal; Notable for the following components:   Glucose, Bld 137 (*)    All other components within normal limits  CBG MONITORING, ED - Abnormal; Notable for the following components:   Glucose-Capillary 142 (*)    All other components within normal limits  I-STAT CHEM 8, ED - Abnormal; Notable for the following components:   Glucose, Bld 134 (*)    Calcium, Ion 1.13 (*)    All other components within normal limits  PROTIME-INR  APTT  CBC  ETHANOL  HIV ANTIBODY (ROUTINE TESTING W REFLEX)  CBG MONITORING, ED    EKG EKG Interpretation Date/Time:  Monday February 04 2024 13:39:22 EST Ventricular Rate:  63 PR Interval:  232 QRS Duration:  100 QT Interval:  404 QTC Calculation: 414 R Axis:   76  Text Interpretation: Sinus rhythm Prolonged PR interval Borderline low voltage, extremity leads No significant change since last tracing Confirmed by Jacalyn Lefevre [phone]) on 02/04/2024 1:42:45 PM  Radiology CT HEAD CODE STROKE WO CONTRAST Result Date: 02/04/2024 CLINICAL DATA:  Code stroke. Provided history: Neuro deficit, acute, stroke suspected. Slurred speech. Facial droop. Weakness. EXAM: CT HEAD WITHOUT CONTRAST TECHNIQUE: Contiguous axial images were obtained from the base of the skull through the vertex without intravenous contrast. RADIATION DOSE REDUCTION: This exam was performed according to the departmental dose-optimization program which includes automated exposure control, adjustment of the mA and/or kV according to patient size and/or use of iterative reconstruction technique. COMPARISON:  Brain MRI 11/04/2022. FINDINGS: Brain: Generalized cerebral atrophy. Moderate-sized focus of chronic encephalomalacia/gliosis again demonstrated within the anterolateral left frontal lobe (underlying a cranioplasty). Chronic lacunar infarcts  within/about the bilateral deep gray nuclei and within the left aspect of the pons. As compared to the prior brain MRI of 11/04/2022, no new acute lacunar infarct is identified. Background moderate patchy and ill-defined hypoattenuation within the cerebral white matter, nonspecific but compatible chronic small vessel disease. There is no acute intracranial hemorrhage. No acute demarcated cortical infarct. No extra-axial fluid collection. No evidence of an intracranial mass. No midline shift. Vascular: No hyperdense vessel.  Atherosclerotic calcifications. Skull: No acute calvarial fracture.  Left pterional cranioplasty. Sinuses/Orbits: No mass or acute finding within the imaged orbits. Mild mucosal thickening or small  mucous retention cyst within the left maxillary sinus inferiorly. ASPECTS [facility] Stroke Program Early CT Score) - Ganglionic level infarction (caudate, lentiform nuclei, internal capsule, insula, M1-M3 cortex): 7 - Supraganglionic infarction (M4-M6 cortex): 3 Total score (0-10 with 10 being normal): 10 (when discounting chronic infarcts). No evidence of an acute intracranial abnormality. These results were communicated to Bhagat at 1:55 pmon 3/3/2025by text page via the [facility] messaging system. IMPRESSION: 1. No evidence of an acute intracranial abnormality. 2. Moderate-sized focus of chronic encephalomalacia/gliosis within the anterolateral left frontal lobe (underlying a cranioplasty). 3. Background parenchymal atrophy, chronic small vessel ischemic disease and chronic infarcts, as described. 4. Mild left maxillary sinus disease. Electronically Signed   By: Jackey Loge D.O.   On: 02/04/2024 13:55    Procedures Procedures    Medications Ordered in ED Medications  atorvastatin (LIPITOR) tablet 20 mg (has no administration in time range)  tamsulosin (FLOMAX) capsule 0.4 mg (has no administration in time range)   stroke: early stages of recovery book (has no administration in time range)   acetaminophen (TYLENOL) tablet 650 mg (has no administration in time range)    Or  acetaminophen (TYLENOL) 160 MG/5ML solution 650 mg (has no administration in time range)    Or  acetaminophen (TYLENOL) suppository 650 mg (has no administration in time range)  senna-docusate (Senokot-S) tablet 1 tablet (has no administration in time range)  sodium chloride flush (NS) 0.9 % injection 3 mL (3 mLs Intravenous Given 02/04/24 1409)  iohexol (OMNIPAQUE) 350 MG/ML injection 75 mL (75 mLs Intravenous Contrast Given 02/04/24 1333)    ED Course/ Medical Decision Making/ A&P                                 Medical Decision Making Amount and/or Complexity of Data Reviewed Labs: ordered. Radiology: ordered.  Risk Decision regarding hospitalization.   This patient presents to the ED for concern of cva, this involves an extensive number of treatment options, and is a complaint that carries with it a high risk of complications and morbidity.  The differential diagnosis includes cva, tia, electrolyte abn   Co morbidities that complicate the patient evaluation  dm, hld, htn, cva, metastatic melanoma, hx etoh abuse   Additional history obtained:  Additional history obtained from epic chart review  Lab Tests:  I Ordered, and personally interpreted labs.  The pertinent results include:  cbc nl, cmb nl, etoh neg,    Imaging Studies ordered:  I ordered imaging studies including ct head, cxr  I independently visualized and interpreted imaging which showed  CT head:  No evidence of an acute intracranial abnormality.  2. Moderate-sized focus of chronic encephalomalacia/gliosis within  the anterolateral left frontal lobe (underlying a cranioplasty).  3. Background parenchymal atrophy, chronic small vessel ischemic  disease and chronic infarcts, as described.  4. Mild left maxillary sinus disease.   I agree with the radiologist interpretation   Cardiac Monitoring:  The patient was  maintained on a cardiac monitor.  I personally viewed and interpreted the cardiac monitored which showed an underlying rhythm of: sb   Medicines ordered and prescription drug management:   I have reviewed the patients home medicines and have made adjustments as needed   Test Considered:  CT, mri   Critical Interventions:  Code stroke   Consultations Obtained:  I requested consultation with the neurologist (Dr. Iver Nestle),  and discussed lab and imaging findings as well as  pertinent plan - she responded to the code stroke and recommends admission for stroke work up Pt d/w Dr. Alinda Money ([facility]) for admission   Problem List / ED Course:  CVA:  pt is out of the window for TNK.  No LVO.  Neurology recommended admission for CVA work up.   Reevaluation:  After the interventions noted above, I reevaluated the patient and found that they have :improved   Social Determinants of Health:  Lives at home   Dispostion:  After consideration of the diagnostic results and the patients response to treatment, I feel that the patent would benefit from admission.          Final Clinical Impression(s) / ED Diagnoses Final diagnoses:  Cerebrovascular accident (CVA), unspecified mechanism (HCC)    Rx / DC Orders ED Discharge Orders     None         Jacalyn Lefevre, MD 02/04/24 [phone]

## 2024-02-04 NOTE — ED Notes (Signed)
 Pt states he didn't notice his slurred speech until around 1100 and tingling in his right hand around 0900. Pt denies blurred vision, headache, or dizziness.

## 2024-02-04 NOTE — H&P (Signed)
 History and Physical   Kenneth [name] ZOX:[id number] DOB: 1960-12-29 DOA: 02/04/2024  PCP: Loyola Mast, MD   Patient coming from: Home  Chief Complaint: Focal neurologic deficit  HPI: Kenneth [name] is a 63 y.o. male with medical history significant of hypertension, hyperlipidemia, gastritis, stroke, diabetes, neuropathy, metastatic melanoma in remission presenting with focal neurologic deficit.  Patient reports he was okay when he went to bed last night around 9:30 PM.  He woke up this morning with increased right-sided weakness and difficulty speaking.  Code stroke was called in the ED.  Denies fevers, chills, chest pain, shortness of breath, abdominal pain, constipation, diarrhea, nausea, vomiting.  ED Course: Vital signs in the ED notable for blood pressure 130s to 150s systolic.  Lab workup included CMP with glucose 137, CBC within normal limits.  PT, PTT, INR within normal limits.  Ethanol level negative.  Chest x-ray pending.  CT head showed no acute abnormality, did show focus of chronic encephalomalacia/gliosis within the frontal lobe underlying a cranioplasty.  CTA head and neck and MRI ordered and are pending.  Neurology consulted and are recommending stroke workup.  No interventions thus far in the ED.  Review of Systems: As per HPI otherwise all other systems reviewed and are negative.  Past Medical History:  Diagnosis Date   Alcohol dependence (HCC)    Chronic calcific pancreatitis (HCC)    CVA (cerebral vascular accident) (HCC) 01/24/2022   Diabetes mellitus without complication (HCC)    Hyperlipidemia    Hypertension    Melanoma (HCC) 2013   metastatic - resected and cured with chemo/immuno tx   Pancreatic cyst-mass?    Polyneuropathy     Past Surgical History:  Procedure Laterality Date   BIOPSY  02/03/2022   Procedure: BIOPSY;  Surgeon: Iva Boop, MD;  Location: [facility] ENDOSCOPY;  Service: Gastroenterology;;   BRAIN SURGERY  2015   to check for possible  malignancy from melanoma, result were scar tissue   COLONOSCOPY WITH PROPOFOL N/A 02/03/2022   Procedure: COLONOSCOPY WITH PROPOFOL;  Surgeon: Iva Boop, MD;  Location: [facility] ENDOSCOPY;  Service: Gastroenterology;  Laterality: N/A;   ESOPHAGOGASTRODUODENOSCOPY (EGD) WITH PROPOFOL N/A 02/03/2022   Procedure: ESOPHAGOGASTRODUODENOSCOPY (EGD) WITH PROPOFOL;  Surgeon: Iva Boop, MD;  Location: [facility] ENDOSCOPY;  Service: Gastroenterology;  Laterality: N/A;   melanoma exicison     right leg    Social History  reports that he has never smoked. He has quit using smokeless tobacco.  His smokeless tobacco use included snuff. He reports that he does not currently use alcohol. He reports that he does not use drugs.  Allergies  Allergen Reactions   Cat Dander Shortness Of Breath and Swelling    Swelling, watery eyes    Family History  Problem Relation Age of Onset   Heart disease Father    Prostate cancer Father    Cancer Maternal Grandmother        Brain   Stomach cancer Paternal Grandmother    Colon cancer Neg Hx    Esophageal cancer Neg Hx    Rectal cancer Neg Hx   Reviewed on admission  Prior to Admission medications   Medication Sig Start Date End Date Taking? Authorizing Provider  amLODipine-benazepril (LOTREL) 10-20 MG capsule Take 1 capsule by mouth daily. 08/22/23   Loyola Mast, MD  aspirin EC 81 MG EC tablet Take 1 tablet (81 mg total) by mouth daily. Swallow whole. 01/27/22   Haydee Monica, MD  atorvastatin (  LIPITOR) 20 MG tablet TAKE 1 TABLET BY MOUTH DAILY 03/30/23   Loyola Mast, MD  glipiZIDE (GLUCOTROL XL) 5 MG 24 hr tablet Take 1 tablet (5 mg total) by mouth daily with breakfast. 01/08/24   Loyola Mast, MD  lactobacillus acidophilus (BACID) TABS tablet Take 1 tablet by mouth daily. Engineer, building services, Historical, MD  metoprolol tartrate (LOPRESSOR) 100 MG tablet TAKE 1 TABLET(100 MG) BY MOUTH TWICE DAILY 12/27/23   Loyola Mast, MD  mirtazapine (REMERON) 7.5 MG  tablet TAKE 1 TABLET BY MOUTH DAILY AT BEDTIME 03/30/23   Loyola Mast, MD  Multiple Vitamin (MULTIVITAMIN WITH MINERALS) TABS tablet Take 1 tablet by mouth daily. 01/27/22   Haydee Monica, MD  pioglitazone (ACTOS) 30 MG tablet TAKE 1 TABLET(30 MG) BY MOUTH DAILY 09/12/23   Loyola Mast, MD  potassium chloride (KLOR-CON M) 20 MEQ tablet Take 1 tablet (20 mEq total) by mouth 2 (two) times daily. 01/08/24   Loyola Mast, MD  sitaGLIPtin (JANUVIA) 100 MG tablet Take 1 tablet (100 mg total) by mouth daily. 10/30/23   Loyola Mast, MD  tamsulosin (FLOMAX) 0.4 MG CAPS capsule TAKE 1 CAPSULE(0.4 MG) BY MOUTH DAILY 09/17/23   Loyola Mast, MD    Physical Exam: Vitals:   02/04/24 1251 02/04/24 1303 02/04/24 1315  BP: 133/75  (!) 156/76  Pulse: 60    Resp: 18    Temp: 98 F (36.7 C)    SpO2: 100%    Weight:  74.8 kg     Physical Exam Constitutional:      General: He is not in acute distress.    Appearance: Normal appearance.  HENT:     Head: Normocephalic and atraumatic.     Mouth/Throat:     Mouth: Mucous membranes are moist.     Pharynx: Oropharynx is clear.  Eyes:     Extraocular Movements: Extraocular movements intact.     Pupils: Pupils are equal, round, and reactive to light.  Cardiovascular:     Rate and Rhythm: Normal rate and regular rhythm.     Pulses: Normal pulses.     Heart sounds: Normal heart sounds.  Pulmonary:     Effort: Pulmonary effort is normal. No respiratory distress.     Breath sounds: Normal breath sounds.  Abdominal:     General: Bowel sounds are normal. There is no distension.     Palpations: Abdomen is soft.     Tenderness: There is no abdominal tenderness.  Musculoskeletal:        General: No swelling or deformity.  Skin:    General: Skin is warm and dry.  Neurological:     Comments: Mental Status: Patient is awake, alert, oriented  No signs of aphasia or neglect Cranial Nerves: II: Pupils equal, round, and reactive to light.    III,IV, VI: EOMI without ptosis or diploplia.  V: Facial sensation is symmetric to light touch. VII: Right facial droop VIII: hearing is intact to voice X: Uvula elevates symmetrically XI: Shoulder shrug is symmetric. XII: tongue is deviated to the right, dysarthria Motor: Good effort thorughout, at Least 3-/5 right upper extremity, 5/5 left upper extremity, 5/5 bilateral lower extremitiy  Sensory: Sensation is grossly intact bilateral UEs & LEs Cerebellar: Finger-Nose intact bilalat    Labs on Admission: I have personally reviewed following labs and imaging studies  CBC: Recent Labs  Lab 02/04/24 1305 02/04/24 1311  WBC 7.0  --   NEUTROABS  3.9  --   HGB 14.8 15.0  HCT 43.1 44.0  MCV 86.4  --   PLT 266  --     Basic Metabolic Panel: Recent Labs  Lab 02/04/24 1305 02/04/24 1311  NA 139 141  K 3.6 3.6  CL 105 105  CO2 24  --   GLUCOSE 137* 134*  BUN 15 17  CREATININE 0.94 1.00  CALCIUM 9.2  --     GFR: Estimated Creatinine Clearance: 70.9 mL/min (by C-G formula based on SCr of 1 mg/dL).  Liver Function Tests: Recent Labs  Lab 02/04/24 1305  AST 19  ALT 22  ALKPHOS 49  BILITOT 0.3  PROT 7.2  ALBUMIN 3.9    Urine analysis:    Component Value Date/Time   COLORURINE YELLOW 10/29/2023 1409   APPEARANCEUR CLEAR 10/29/2023 1409   LABSPEC 1.025 10/29/2023 1409   PHURINE 6.0 10/29/2023 1409   GLUCOSEU >=1000 (A) 10/29/2023 1409   HGBUR TRACE-INTACT (A) 10/29/2023 1409   BILIRUBINUR NEGATIVE 10/29/2023 1409   BILIRUBINUR n 03/20/2016 1017   KETONESUR NEGATIVE 10/29/2023 1409   PROTEINUR 100 (A) 11/03/2022 1528   UROBILINOGEN 0.2 10/29/2023 1409   NITRITE NEGATIVE 10/29/2023 1409   LEUKOCYTESUR NEGATIVE 10/29/2023 1409    Radiological Exams on Admission: CT HEAD CODE STROKE WO CONTRAST Result Date: 02/04/2024 CLINICAL DATA:  Code stroke. Provided history: Neuro deficit, acute, stroke suspected. Slurred speech. Facial droop. Weakness. EXAM: CT HEAD  WITHOUT CONTRAST TECHNIQUE: Contiguous axial images were obtained from the base of the skull through the vertex without intravenous contrast. RADIATION DOSE REDUCTION: This exam was performed according to the departmental dose-optimization program which includes automated exposure control, adjustment of the mA and/or kV according to patient size and/or use of iterative reconstruction technique. COMPARISON:  Brain MRI 11/04/2022. FINDINGS: Brain: Generalized cerebral atrophy. Moderate-sized focus of chronic encephalomalacia/gliosis again demonstrated within the anterolateral left frontal lobe (underlying a cranioplasty). Chronic lacunar infarcts within/about the bilateral deep gray nuclei and within the left aspect of the pons. As compared to the prior brain MRI of 11/04/2022, no new acute lacunar infarct is identified. Background moderate patchy and ill-defined hypoattenuation within the cerebral white matter, nonspecific but compatible chronic small vessel disease. There is no acute intracranial hemorrhage. No acute demarcated cortical infarct. No extra-axial fluid collection. No evidence of an intracranial mass. No midline shift. Vascular: No hyperdense vessel.  Atherosclerotic calcifications. Skull: No acute calvarial fracture.  Left pterional cranioplasty. Sinuses/Orbits: No mass or acute finding within the imaged orbits. Mild mucosal thickening or small mucous retention cyst within the left maxillary sinus inferiorly. ASPECTS [facility] Stroke Program Early CT Score) - Ganglionic level infarction (caudate, lentiform nuclei, internal capsule, insula, M1-M3 cortex): 7 - Supraganglionic infarction (M4-M6 cortex): 3 Total score (0-10 with 10 being normal): 10 (when discounting chronic infarcts). No evidence of an acute intracranial abnormality. These results were communicated to Bhagat at 1:55 pmon 3/3/2025by text page via the [facility] messaging system. IMPRESSION: 1. No evidence of an acute intracranial abnormality. 2.  Moderate-sized focus of chronic encephalomalacia/gliosis within the anterolateral left frontal lobe (underlying a cranioplasty). 3. Background parenchymal atrophy, chronic small vessel ischemic disease and chronic infarcts, as described. 4. Mild left maxillary sinus disease. Electronically Signed   By: Jackey Loge D.O.   On: 02/04/2024 13:55   EKG: Independently reviewed.  Sinus rhythm at 63 bpm.  Nonspecific T wave changes.  Low voltage multiple leads.  Assessment/Plan Principal Problem:   Acute focal neurological deficit Active Problems:  Gastritis and gastroduodenitis   Type 2 diabetes mellitus (HCC)   Essential hypertension   Metastatic melanoma to lung (HCC)   Dyslipidemia   Melanoma metastatic to brain (HCC)   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HCC)   History of CVA (cerebrovascular accident)   Peripheral neuropathy   Acute focal neurologic deficit Stroke suspected History of CVA > Patient presenting after waking up with right-sided weakness and difficulty speaking this morning. > History of prior CVA.  History of melanoma metastatic to brain status post cranioplasty in remission. > CT head showed no acute normality, did show chronic encephalomalacia/gliosis associated with his cranioplasty. > CTA head and neck and MRI brain are pending.  Neurology consulted and are following. - Appreciate neurology recommendations and assistance - Allow for permissive HTN (systolic < 220 and diastolic < 120) - Continue home aspirin, Plavix per Neuro - Home atorvastatin - Echocardiogram  - Follow up CTA head neck and MRI brain - A1C  - Lipid panel  - Tele monitoring  - SLP eval - PT/OT  Hypertension - Holding antihypertensives in the setting of above  Hyperlipidemia - Continue home atorvastatin  Diabetes - SSI  History of metastatic melanoma > History of mets to brain and lung.  In remission. - Noted  DVT prophylaxis: SCDs for now Code Status:   Full Family Communication:  Not present at  bedside when seen, then went with patient to MRI after I left the room.  Disposition Plan:   Patient is from:  Home  Anticipated DC to:  Home  Anticipated DC date:  1 to 3 days  Anticipated DC barriers: None  Consults called:  Neurology Admission status:  Observation, telemetry  Severity of Illness: The appropriate patient status for this patient is OBSERVATION. Observation status is judged to be reasonable and necessary in order to provide the required intensity of service to ensure the patient's safety. The patient's presenting symptoms, physical exam findings, and initial radiographic and laboratory data in the context of their medical condition is felt to place them at decreased risk for further clinical deterioration. Furthermore, it is anticipated that the patient will be medically stable for discharge from the hospital within 2 midnights of admission.    Synetta Fail MD [facility]  How to contact the [facility] Attending or Consulting provider 7A - 7P or covering provider during after hours 7P -7A, for this patient?   Check the care team in [facility] and look for a) attending/consulting [facility] provider listed and b) the [facility] team listed Log into [email or web address] and use [facility] universal password to access. If you do not have the password, please contact the hospital operator. Locate the [facility] provider you are looking for under [facility] and page to a number that you can be directly reached. If you still have difficulty reaching the provider, please page the [facility] (Director on Call) for the Hospitalists listed on amion for assistance.  02/04/2024, 3:10 PM

## 2024-02-04 NOTE — Consult Note (Signed)
 NEUROLOGY CONSULT NOTE   Date of service: February 04, 2024 Patient Name: Kenneth [name] MRN:  [id number] DOB:  03-19-1961 Chief Complaint: "Code stroke with slurred speech, facial droop and right arm tingling" Requesting Provider: Jacalyn Lefevre, MD  History of Present Illness  Kenneth [name] is a 63 y.o. male with hx of HTN, HLD, ETOH abuse, DM, polyneuropathy, prior CVA, and melanoma who presents to [facility] ED via private vehicle for evaluation of slurred speech, facial droop and right arm numbness and tingling. Code stroke was activated in the ED.  CT head no acute process. CTA head and neck with no LVO.  Per patient he states he went to bed at 2130 in his normal state of health. He awoke this am at 0845 with right arm numbness. He did not speak or look at himself in the mirror this morning. He spoke to his sister this morning around 1100 and they both realized he was slurring his words.   LKW: 2130 on 3/2 Modified rankin score: 0-Completely asymptomatic and back to baseline post- stroke IV Thrombolysis: No outside window  EVT:  No LVO  NIHSS components Score: Comment  1a Level of Conscious 0[x]  1[]  2[]  3[]      1b LOC Questions 0[x]  1[]  2[]       1c LOC Commands 0[x]  1[]  2[]       2 Best Gaze 0[x]  1[]  2[]       3 Visual 0[x]  1[]  2[]  3[]      4 Facial Palsy 0[]  1[x]  2[]  3[]      5a Motor Arm - left 0[x]  1[]  2[]  3[]  4[]  UN[]    5b Motor Arm - Right 0[x]  1[]  2[]  3[]  4[]  UN[]  Pronates without drift  6a Motor Leg - Left 0[x]  1[]  2[]  3[]  4[]  UN[]    6b Motor Leg - Right 0[]  1[x]  2[]  3[]  4[]  UN[]    7 Limb Ataxia 0[]  1[x]  2[]  3[]  UN[]   In LUE which is baseline with mild tremor  8 Sensory 0[]  1[x]  2[]  UN[]      9 Best Language 0[x]  1[]  2[]  3[]      10 Dysarthria 0[]  1[x]  2[]  UN[]      11 Extinct. and Inattention 0[x]  1[]  2[]       TOTAL: 5      ROS   Comprehensive ROS performed and pertinent positives documented in HPI    Past History   Past Medical History:  Diagnosis Date   Alcohol dependence  (HCC)    Chronic calcific pancreatitis (HCC)    CVA (cerebral vascular accident) (HCC) 01/24/2022   Diabetes mellitus without complication (HCC)    Hyperlipidemia    Hypertension    Melanoma (HCC) 2013   metastatic - resected and cured with chemo/immuno tx   Pancreatic cyst-mass?    Polyneuropathy     Past Surgical History:  Procedure Laterality Date   BIOPSY  02/03/2022   Procedure: BIOPSY;  Surgeon: Iva Boop, MD;  Location: [name] ENDOSCOPY;  Service: Gastroenterology;;   BRAIN SURGERY  2015   to check for possible malignancy from melanoma, result were scar tissue   COLONOSCOPY WITH PROPOFOL N/A 02/03/2022   Procedure: COLONOSCOPY WITH PROPOFOL;  Surgeon: Iva Boop, MD;  Location: [facility] ENDOSCOPY;  Service: Gastroenterology;  Laterality: N/A;   ESOPHAGOGASTRODUODENOSCOPY (EGD) WITH PROPOFOL N/A 02/03/2022   Procedure: ESOPHAGOGASTRODUODENOSCOPY (EGD) WITH PROPOFOL;  Surgeon: Iva Boop, MD;  Location: [facility] ENDOSCOPY;  Service: Gastroenterology;  Laterality: N/A;   melanoma exicison     right leg    Family History:  Family History  Problem Relation Age of Onset   Heart disease Father    Prostate cancer Father    Cancer Maternal Grandmother        Brain   Stomach cancer Paternal Grandmother    Colon cancer Neg Hx    Esophageal cancer Neg Hx    Rectal cancer Neg Hx     Social History  reports that he has never smoked. He has quit using smokeless tobacco.  His smokeless tobacco use included snuff. He reports that he does not currently use alcohol. He reports that he does not use drugs.  Allergies  Allergen Reactions   Cat Dander Shortness Of Breath and Swelling    Swelling, watery eyes    Medications   Current Facility-Administered Medications:    sodium chloride flush (NS) 0.9 % injection 3 mL, 3 mL, Intravenous, Once, Jacalyn Lefevre, MD  Current Outpatient Medications:    amLODipine-benazepril (LOTREL) 10-20 MG capsule, Take 1 capsule by mouth daily., Disp: 90  capsule, Rfl: 3   aspirin EC 81 MG EC tablet, Take 1 tablet (81 mg total) by mouth daily. Swallow whole., Disp: 30 tablet, Rfl: 11   atorvastatin (LIPITOR) 20 MG tablet, TAKE 1 TABLET BY MOUTH DAILY, Disp: 90 tablet, Rfl: 3   glipiZIDE (GLUCOTROL XL) 5 MG 24 hr tablet, Take 1 tablet (5 mg total) by mouth daily with breakfast., Disp: 90 tablet, Rfl: 3   lactobacillus acidophilus (BACID) TABS tablet, Take 1 tablet by mouth daily. Align, Disp: , Rfl:    metoprolol tartrate (LOPRESSOR) 100 MG tablet, TAKE 1 TABLET(100 MG) BY MOUTH TWICE DAILY, Disp: 180 tablet, Rfl: 3   mirtazapine (REMERON) 7.5 MG tablet, TAKE 1 TABLET BY MOUTH DAILY AT BEDTIME, Disp: 90 tablet, Rfl: 3   Multiple Vitamin (MULTIVITAMIN WITH MINERALS) TABS tablet, Take 1 tablet by mouth daily., Disp: , Rfl:    pioglitazone (ACTOS) 30 MG tablet, TAKE 1 TABLET(30 MG) BY MOUTH DAILY, Disp: 30 tablet, Rfl: 5   potassium chloride (KLOR-CON M) 20 MEQ tablet, Take 1 tablet (20 mEq total) by mouth 2 (two) times daily., Disp: 60 tablet, Rfl: 3   sitaGLIPtin (JANUVIA) 100 MG tablet, Take 1 tablet (100 mg total) by mouth daily., Disp: 90 tablet, Rfl: 3   tamsulosin (FLOMAX) 0.4 MG CAPS capsule, TAKE 1 CAPSULE(0.4 MG) BY MOUTH DAILY, Disp: 90 capsule, Rfl: 3  Vitals   Vitals:   02/04/24 1251 02/04/24 1303  BP: 133/75   Pulse: 60   Resp: 18   Temp: 98 F (36.7 C)   SpO2: 100%   Weight:  74.8 kg    Body mass index is 28.32 kg/m.  Physical Exam   Constitutional: Appears well-developed and well-nourished.  Psych: Affect appropriate to situation.  Eyes: No scleral injection.  HENT: No OP obstruction.  Head: Normocephalic.  Cardiovascular: Normal rate and regular rhythm.  Respiratory: Effort normal, non-labored breathing.  GI: Soft.  No distension. There is no tenderness.  Skin: WDI.   Neurologic Examination  General - Well nourished, well developed, in no apparent distress. Cardiovascular - Regular rhythm and rate.  Mental  Status -  Level of arousal and orientation to time, place, and person were intact. dysarthric, no aphasia Language including expression, naming, repetition, comprehension was assessed and found intact. Attention span and concentration were normal. Recent and remote memory were intact. Fund of Knowledge was assessed and was intact.  Cranial Nerves II - XII - II - Visual field intact OU.  Pupils equal round  and reactive to light III, IV, VI - Extraocular movements intact.  Saccadic pursuits V - Facial sensation intact bilaterally. VII -mild right facial droop VIII - Hearing & vestibular intact bilaterally. X - Palate elevates symmetrically. XI - Chin turning & shoulder shrug intact bilaterally. XII - Tongue protrusion intact.  Motor Strength - bilateral uppers 5/5 with no drift, right upper pronates without drift. Right lower 4/5 with drift, left lower 5/5 Bulk was normal and fasciculations were absent.   Motor Tone - Muscle tone was assessed at the neck and appendages and was normal. Sensory - decreased sensation on right arm  Coordination - LUE with ataxia.  Tremor in left upper baseline Gait and Station - deferred.  Labs/Imaging/Neurodiagnostic studies   CBC:  Recent Labs  Lab 02/29/24 1305 02-29-24 1311  WBC 7.0  --   NEUTROABS 3.9  --   HGB 14.8 15.0  HCT 43.1 44.0  MCV 86.4  --   PLT 266  --    Basic Metabolic Panel:  Lab Results  Component Value Date   NA 141 2024/02/29   K 3.6 Feb 29, 2024   CO2 24 Feb 29, 2024   GLUCOSE 134 (H) 2024-02-29   BUN 17 Feb 29, 2024   CREATININE 1.00 02/29/2024   CALCIUM 9.2 2024-02-29   GFRNONAA >60 02/29/24   Lipid Panel:  Lab Results  Component Value Date   LDLCALC 45 10/29/2023   HgbA1c:  Lab Results  Component Value Date   HGBA1C 9.0 (H) 10/29/2023   Urine Drug Screen:     Component Value Date/Time   LABOPIA NONE DETECTED 01/25/2022 1612   COCAINSCRNUR NONE DETECTED 01/25/2022 1612   LABBENZ NONE DETECTED 01/25/2022  1612   AMPHETMU NONE DETECTED 01/25/2022 1612   THCU NONE DETECTED 01/25/2022 1612   LABBARB NONE DETECTED 01/25/2022 1612    Alcohol Level     Component Value Date/Time   ETH <10 02-29-24 1305   INR  Lab Results  Component Value Date   INR 1.1 Feb 29, 2024   APTT  Lab Results  Component Value Date   APTT 31 2024-02-29   AED levels: No results found for: "PHENYTOIN", "ZONISAMIDE", "LAMOTRIGINE", "LEVETIRACETA"  CT Head without contrast(Personally reviewed): No acute process. Aspects 10. Moderate-sized focus of chronic encephalomalacia/gliosis within the anterolateral left frontal lobe.  atrophy, chronic small vessel ischemic disease and chronic infarcts,  CT angio Head and Neck with contrast(Personally reviewed): CTA neck: 1. The common carotid and internal carotid arteries are patent within the neck without stenosis. Mild atherosclerotic plaque bilaterally, as described. 2. The right vertebral artery is non-dominant and developmentally diminutive, but patent throughout the neck. 3. The dominant left vertebral artery is patent within the neck without stenosis. 4. Aortic Atherosclerosis (ICD10-I70.0). 5. Aberrant right subclavian artery (anatomic variant).  CTA head: 1. No proximal intracranial large vessel occlusion is identified. 2. Intracranial atherosclerotic disease with multifocal stenoses, most notably as follows. 3. Severe stenosis at the origin of an M2 left middle cerebral artery vessel. 4. Severe stenosis within the right posterior cerebral artery P3 segment.   MR Angio head without contrast and Carotid Duplex BL(Personally reviewed): NO LVO. Official read is pending   MRI Brain(Personally reviewed): Ordered     ASSESSMENT   Kenneth [name] is a 63 y.o. male with hx of HTN, HLD, ETOH abuse, DM, polyneuropathy, prior CVA, and melanoma who presents to [facility] ED for evaluation of slurred speech, facial droop and right arm numbness and tingling.Seen as a  code stroke. CT head with no acute  process and CTA with no LVO, however severe stenosis of an left M2 branch which likely is contributory to his symptoms  RECOMMENDATIONS   #Stroke determined by clinical assessment, etiology to be determined but likely secondary to severe left M2 branch stenosis - HgbA1c, fasting lipid panel - MRI of the brain without contrast - Frequent neuro checks - Echocardiogram - bedside swallow scree. If passes may have heart healthy diet  - Allow for permissive hypertension  - Continue statin, adjust medications as needed for goal LDL less than 70 - Continue home aspirin 81 mg daily - Plavix 300 mg loading dose followed by 75 mg daily; pattern of stroke on MRI brain will guide decision - Risk factor modification - Telemetry monitoring - PT consult, OT consult, Speech consult - Stroke team to follow ______________________________________________________________________   Gevena Mart DNP, ACNPC-AG  [facility]  Brooke Dare MD-PhD [facility] [phone] Available 7 AM to 7 PM, outside these hours please contact Neurologist on call listed on AMION

## 2024-02-04 NOTE — ED Notes (Signed)
 [facility] called at 1308 spoke with [place]

## 2024-02-04 NOTE — ED Notes (Signed)
 Pt to MRI

## 2024-02-04 NOTE — ED Provider Triage Note (Signed)
 Emergency Medicine Provider Triage Evaluation Note  Kenneth [name] , a 63 y.o. male  was evaluated in triage.  Pt complains of slurred speech, facial droop, right upper extremity weakness.  Last known well and.  Review of Systems  Positive:  Negative:   Physical Exam  BP 133/75 (BP Location: Right Arm)   Pulse 60   Temp 98 F (36.7 C)   Resp 18   SpO2 100%  Gen:   Awake, no distress   Resp:  Normal effort  Neuro:  Aphasia, facial droop, right upper extremity weakness and sensory deficit  Medical Decision Making  Medically screening exam initiated at 1:01 PM.  Appropriate orders placed.  Kenneth [name] was informed that the remainder of the evaluation will be completed by another provider, this initial triage assessment does not replace that evaluation, and the importance of remaining in the ED until their evaluation is complete.     Halford Decamp, PA-C 02/04/24 1303

## 2024-02-04 NOTE — ED Notes (Signed)
 Pt to ED via triage c/o slurred speech and right arm tingling that started at 11am today. Pt roomed to triage, code stroke activated.

## 2024-02-05 ENCOUNTER — Observation Stay (HOSPITAL_COMMUNITY)

## 2024-02-05 ENCOUNTER — Ambulatory Visit: Payer: Medicaid Other | Admitting: Physical Therapy

## 2024-02-05 DIAGNOSIS — I5030 Unspecified diastolic (congestive) heart failure: Secondary | ICD-10-CM

## 2024-02-05 DIAGNOSIS — R29818 Other symptoms and signs involving the nervous system: Secondary | ICD-10-CM | POA: Diagnosis not present

## 2024-02-05 LAB — GLUCOSE, CAPILLARY
Glucose-Capillary: 102 mg/dL — ABNORMAL HIGH (ref 70–99)
Glucose-Capillary: 104 mg/dL — ABNORMAL HIGH (ref 70–99)
Glucose-Capillary: 125 mg/dL — ABNORMAL HIGH (ref 70–99)
Glucose-Capillary: 165 mg/dL — ABNORMAL HIGH (ref 70–99)

## 2024-02-05 LAB — CBC
HCT: 36.4 % — ABNORMAL LOW (ref 39.0–52.0)
Hemoglobin: 12.3 g/dL — ABNORMAL LOW (ref 13.0–17.0)
MCH: 28.8 pg (ref 26.0–34.0)
MCHC: 33.8 g/dL (ref 30.0–36.0)
MCV: 85.2 fL (ref 80.0–100.0)
Platelets: 244 10*3/uL (ref 150–400)
RBC: 4.27 MIL/uL (ref 4.22–5.81)
RDW: 14 % (ref 11.5–15.5)
WBC: 8.5 10*3/uL (ref 4.0–10.5)
nRBC: 0 % (ref 0.0–0.2)

## 2024-02-05 LAB — LIPID PANEL
Cholesterol: 90 mg/dL (ref 0–200)
HDL: 39 mg/dL — ABNORMAL LOW (ref >40)
LDL Cholesterol: 41 mg/dL (ref 0–99)
Total CHOL/HDL Ratio: 2.3 ratio
Triglycerides: 48 mg/dL (ref <150)
VLDL: 10 mg/dL (ref 0–40)

## 2024-02-05 LAB — HEMOGLOBIN A1C
Hgb A1c MFr Bld: 8.8 % — ABNORMAL HIGH (ref 4.8–5.6)
Mean Plasma Glucose: 205.86 mg/dL

## 2024-02-05 LAB — ECHOCARDIOGRAM COMPLETE
Area-P 1/2: 3.3 cm2
Calc EF: 64.5 %
Height: 64 in
S' Lateral: 2.5 cm
Single Plane A2C EF: 65.6 %
Single Plane A4C EF: 66.2 %
Weight: 2640 [oz_av]

## 2024-02-05 LAB — COMPREHENSIVE METABOLIC PANEL
ALT: 21 U/L (ref 0–44)
AST: 19 U/L (ref 15–41)
Albumin: 3.1 g/dL — ABNORMAL LOW (ref 3.5–5.0)
Alkaline Phosphatase: 43 U/L (ref 38–126)
Anion gap: 8 (ref 5–15)
BUN: 19 mg/dL (ref 8–23)
CO2: 23 mmol/L (ref 22–32)
Calcium: 8.5 mg/dL — ABNORMAL LOW (ref 8.9–10.3)
Chloride: 106 mmol/L (ref 98–111)
Creatinine, Ser: 1.07 mg/dL (ref 0.61–1.24)
GFR, Estimated: >60 mL/min (ref >60)
Glucose, Bld: 99 mg/dL (ref 70–99)
Potassium: 3.2 mmol/L — ABNORMAL LOW (ref 3.5–5.1)
Sodium: 137 mmol/L (ref 135–145)
Total Bilirubin: 0.6 mg/dL (ref 0.0–1.2)
Total Protein: 5.9 g/dL — ABNORMAL LOW (ref 6.5–8.1)

## 2024-02-05 LAB — HIV ANTIBODY (ROUTINE TESTING W REFLEX): HIV Screen 4th Generation wRfx: NONREACTIVE

## 2024-02-05 MED ORDER — POTASSIUM CHLORIDE CRYS ER 20 MEQ PO TBCR
40.0000 meq | EXTENDED_RELEASE_TABLET | Freq: Every day | ORAL | Status: DC
  Filled 2024-02-05: qty 2

## 2024-02-05 MED ORDER — ENOXAPARIN SODIUM 40 MG/0.4ML IJ SOSY
40.0000 mg | PREFILLED_SYRINGE | INTRAMUSCULAR | Status: DC
  Administered 2024-02-05 – 2024-02-07 (×3): 40 mg via SUBCUTANEOUS
  Filled 2024-02-05 (×4): qty 0.4

## 2024-02-05 MED ORDER — SODIUM CHLORIDE 0.9 % IV SOLN: INTRAVENOUS | Status: AC

## 2024-02-05 NOTE — Evaluation (Signed)
 Physical Therapy Evaluation Patient Details Name: Kenneth [name] MRN: [id number] DOB: 1961/02/17 Today's Date: 02/05/2024  History of Present Illness  Kenneth [name] is a 63 y.o. male who presented to [facility] ED with c/o slurred speech, facial droop, and R arm tingling. MRI on 02/04/2024 revealed Two adjacent acute infarcts within the left corona radiata, left  lentiform nucleus and posterior limb of left internal capsule. PMHx includes alcohol dependence, chronic calcific pancreatitis, CVA, DM, HLD, HTN, melanoma, polyneuropathy  Clinical Impression  Pt presents with admitting diagnosis above. Pt today was able to ambulate in hallway with Min A. Initially trialed ambulation with SPC however pt was rather unsteady requiring Min A for balance. Once in hallway PT provided pt with RW which pt appeared much more steady with but still required Min A for obstacle navigation. PTA pt reports he was Mod I with no AD inside the house and [facility] outside of the house. Patient will benefit from intensive inpatient follow-up therapy, >3 hours/day as pt was ind PTA, has 24 hour assistance at home, and is highly motivated. PT will continue to follow.          If plan is discharge home, recommend the following: A little help with walking and/or transfers;A little help with bathing/dressing/bathroom;Assistance with cooking/housework;Direct supervision/assist for medications management;Assist for transportation;Help with stairs or ramp for entrance   Can travel by private vehicle        Equipment Recommendations None recommended by PT  Recommendations for Other Services  Rehab consult    Functional Status Assessment Patient has had a recent decline in their functional status and demonstrates the ability to make significant improvements in function in a reasonable and predictable amount of time.     Precautions / Restrictions Precautions Precautions: Fall Restrictions Weight Bearing Restrictions Per Provider Order:  No      Mobility  Bed Mobility Overal bed mobility: Needs Assistance Bed Mobility: Supine to Sit, Sit to Supine     Supine to sit: Contact guard, HOB elevated, Used rails Sit to supine: Contact guard assist   General bed mobility comments: CGA for safety. Verbal cues provided for sequencing.    Transfers Overall transfer level: Needs assistance Equipment used: Straight cane Transfers: Sit to/from Stand Sit to Stand: Min assist           General transfer comment: MIN A for power up to stand.    Ambulation/Gait Ambulation/Gait assistance: Min assist Gait Distance (Feet): 75 Feet Assistive device: Straight cane, Rolling walker (2 wheels) Gait Pattern/deviations: Trunk flexed, Wide base of support, Decreased stride length, Step-through pattern, Drifts right/left Gait velocity: decreased     General Gait Details: initially trialed ambulation with SPC however pt was rather unsteady requiring Min A for balance. Once in hallway PT provided pt with RW which pt appeared much more steady with.  Stairs            Wheelchair Mobility     Tilt Bed    Modified Rankin (Stroke Patients Only) Modified Rankin (Stroke Patients Only) Pre-Morbid Rankin Score: Slight disability Modified Rankin: Moderate disability     Balance Overall balance assessment: Needs assistance Sitting-balance support: Single extremity supported, Feet supported Sitting balance-Leahy Scale: Fair Sitting balance - Comments: Pt engaged in lower body dressing seated EOB   Standing balance support: Bilateral upper extremity supported, Reliant on assistive device for balance, During functional activity Standing balance-Leahy Scale: Poor Standing balance comment: Pt engaged in functional ambulation using RW. Requires use of AD for support.  Pertinent Vitals/Pain Pain Assessment Pain Assessment: No/denies pain    Home Living Family/patient expects to be  discharged to:: Private residence Living Arrangements: Other relatives (sister) Available Help at Discharge: Family Type of Home: House Home Access: Stairs to enter Entrance Stairs-Rails: Can reach both Entrance Stairs-Number of Steps: 3   Home Layout: Two level Home Equipment: Cane - single point      Prior Function Prior Level of Function : Independent/Modified Independent;History of Falls (last six months) (Pt reports 1 fall in the last 6 months)             Mobility Comments: Pt reports using a cane in the community and ambulates without anything within the home ADLs Comments: Completed ADLs independently. Pt prepares breakfast using microwave. Sister assists with cooking dinner.     Extremity/Trunk Assessment   Upper Extremity Assessment Upper Extremity Assessment: Right hand dominant RUE Deficits / Details: <90 degree shoulder flexion AROM. Impaired sensation, gross motor, and FM coordination. Decreased grip strength RUE Sensation: decreased light touch RUE Coordination: decreased fine motor;decreased gross motor LUE Deficits / Details: generalized weakness and residual deficits  from previous CVA LUE Coordination: decreased fine motor    Lower Extremity Assessment Lower Extremity Assessment: RLE deficits/detail RLE Deficits / Details: Grossly 4-/5 on LLE    Cervical / Trunk Assessment Cervical / Trunk Assessment: Kyphotic  Communication   Communication Communication: Impaired Factors Affecting Communication: Reduced clarity of speech    Cognition Arousal: Alert Behavior During Therapy: WFL for tasks assessed/performed                             Following commands: Intact       Cueing Cueing Techniques: Verbal cues     General Comments General comments (skin integrity, edema, etc.): VSS    Exercises     Assessment/Plan    PT Assessment Patient needs continued PT services  PT Problem List Decreased strength;Decreased range of  motion;Decreased activity tolerance;Decreased balance;Decreased mobility;Decreased cognition;Decreased coordination;Decreased knowledge of use of DME;Decreased safety awareness;Decreased knowledge of precautions;Cardiopulmonary status limiting activity;Impaired sensation       PT Treatment Interventions DME instruction;Gait training;Stair training;Therapeutic activities;Functional mobility training;Therapeutic exercise;Balance training;Neuromuscular re-education;Patient/family education    PT Goals (Current goals can be found in the Care Plan section)  Acute Rehab PT Goals Patient Stated Goal: to go home PT Goal Formulation: With patient/family Time For Goal Achievement: 02/19/24 Potential to Achieve Goals: Good    Frequency Min 1X/week     Co-evaluation               AM-PAC PT "6 Clicks" Mobility  Outcome Measure Help needed turning from your back to your side while in a flat bed without using bedrails?: A Little Help needed moving from lying on your back to sitting on the side of a flat bed without using bedrails?: A Little Help needed moving to and from a bed to a chair (including a wheelchair)?: A Little Help needed standing up from a chair using your arms (e.g., wheelchair or bedside chair)?: A Little Help needed to walk in hospital room?: A Little Help needed climbing 3-5 steps with a railing? : Total 6 Click Score: 16    End of Session Equipment Utilized During Treatment: Gait belt Activity Tolerance: Patient tolerated treatment well Patient left: in bed;with call bell/phone within reach;with family/visitor present Nurse Communication: Mobility status PT Visit Diagnosis: Other abnormalities of gait and mobility (R26.89)    Time:  6962-9528 PT Time Calculation (min) (ACUTE ONLY): 28 min   Charges:   PT Evaluation $PT Eval Moderate Complexity: 1 Mod PT Treatments $Gait Training: 8-22 mins PT General Charges $$ ACUTE PT VISIT: 1 Visit         Shela Nevin, PT,  DPT Acute Rehab Services [phone]   Gladys Damme 02/05/2024, 1:34 PM

## 2024-02-05 NOTE — Evaluation (Signed)
 Clinical/Bedside Swallow Evaluation Patient Details  Name: SOHIL TIMKO MRN: [id number] Date of Birth: 07-18-1961  Today's Date: 02/05/2024 Time: SLP Start Time (ACUTE ONLY): 0900 SLP Stop Time (ACUTE ONLY): 0916 SLP Time Calculation (min) (ACUTE ONLY): 16 min  Past Medical History:  Past Medical History:  Diagnosis Date   Alcohol dependence (HCC)    Chronic calcific pancreatitis (HCC)    CVA (cerebral vascular accident) (HCC) 01/24/2022   Diabetes mellitus without complication (HCC)    Hyperlipidemia    Hypertension    Melanoma (HCC) 2013   metastatic - resected and cured with chemo/immuno tx   Pancreatic cyst-mass?    Polyneuropathy    Past Surgical History:  Past Surgical History:  Procedure Laterality Date   BIOPSY  02/03/2022   Procedure: BIOPSY;  Surgeon: Iva Boop, MD;  Location: [facility] ENDOSCOPY;  Service: Gastroenterology;;   BRAIN SURGERY  2015   to check for possible malignancy from melanoma, result were scar tissue   COLONOSCOPY WITH PROPOFOL N/A 02/03/2022   Procedure: COLONOSCOPY WITH PROPOFOL;  Surgeon: Iva Boop, MD;  Location: [facility] ENDOSCOPY;  Service: Gastroenterology;  Laterality: N/A;   ESOPHAGOGASTRODUODENOSCOPY (EGD) WITH PROPOFOL N/A 02/03/2022   Procedure: ESOPHAGOGASTRODUODENOSCOPY (EGD) WITH PROPOFOL;  Surgeon: Iva Boop, MD;  Location: [facility] ENDOSCOPY;  Service: Gastroenterology;  Laterality: N/A;   melanoma exicison     right leg   HPI:  DERRIEN ANSCHUTZ is a 63 y.o. male with medical history significant of hypertension, hyperlipidemia, gastritis, stroke, diabetes, neuropathy, metastatic melanoma in remission presenting with focal neurologic deficit characterized by right sided weakness and difficulty speaking. Two adjacent acute infarcts within the left corona radiata, left  lentiform nucleus and posterior limb of left internal capsule (individually measuring up to 2 cm).    Assessment / Plan / Recommendation  Clinical Impression   Patient presents with evidence of an oropharyngeal dysphagia . Oral phase imparied due to right sided deficits resulting from CN VII, V, and XII dysfunction s/p acute CVA. Suspect decreased oral control and possible sensory deficits resulting in delayed swallow initiation and signs of decreased airway protection across consistencies characterized by throat clearing and coughing. Recommend instrumental testing to evaluate swallowing physiology and determine if pos can safely be initiated. Plan for MBS at 1430 today. SLP Visit Diagnosis: Dysarthria and anarthria (R47.1);Dysphagia, oropharyngeal phase (R13.12)       Diet Recommendation NPO    Medication Administration: Via alternative means    Other  Recommendations Oral Care Recommendations: Oral care QID    Recommendations for follow up therapy are one component of a multi-disciplinary discharge planning process, led by the attending physician.  Recommendations may be updated based on patient status, additional functional criteria and insurance authorization.  Follow up Recommendations Acute inpatient rehab (3hours/day)      Assistance Recommended at Discharge Intermittent Supervision/Assistance  Functional Status Assessment Patient has had a recent decline in their functional status and demonstrates the ability to make significant improvements in function in a reasonable and predictable amount of time.  Frequency and Duration min 1 x/week          Prognosis Prognosis for improved oropharyngeal function: Good      Swallow Study   General HPI: ZYDEN SUMAN is a 63 y.o. male with medical history significant of hypertension, hyperlipidemia, gastritis, stroke, diabetes, neuropathy, metastatic melanoma in remission presenting with focal neurologic deficit characterized by right sided weakness and difficulty speaking. Two adjacent acute infarcts within the left corona radiata, left  lentiform nucleus and posterior limb of left internal  capsule (individually measuring up to 2 cm). Type of Study: Bedside Swallow Evaluation Previous Swallow Assessment: none Diet Prior to this Study: NPO Temperature Spikes Noted: No Respiratory Status: Room air History of Recent Intubation: No Behavior/Cognition: Alert;Cooperative;Pleasant mood Oral Cavity Assessment: Within Functional Limits Oral Care Completed by SLP: No Oral Cavity - Dentition: Adequate natural dentition Vision: Functional for self-feeding Self-Feeding Abilities: Able to feed self Patient Positioning: Upright in bed Baseline Vocal Quality: Normal Volitional Cough: Strong Volitional Swallow: Able to elicit    Oral/Motor/Sensory Function Overall Oral Motor/Sensory Function: Moderate impairment Facial ROM: Reduced right;Suspected CN VII (facial) dysfunction Facial Symmetry: Abnormal symmetry right;Suspected CN VII (facial) dysfunction Facial Strength: Reduced right;Suspected CN VII (facial) dysfunction Facial Sensation: Reduced right;Suspected CN V (Trigeminal) dysfunction Lingual ROM: Reduced right;Suspected CN XII (hypoglossal) dysfunction Lingual Symmetry: Abnormal symmetry right;Suspected CN XII (hypoglossal) dysfunction Lingual Strength: Reduced;Suspected CN XII (hypoglossal) dysfunction Lingual Sensation: Within Functional Limits Velum: Impaired right;Suspected CN X (Vagus) dysfunction Mandible: Within Functional Limits   Ice Chips Ice chips: Within functional limits Presentation: Spoon   Thin Liquid Thin Liquid: Impaired Presentation: Cup;Self Fed Oral Phase Functional Implications: Right anterior spillage Pharyngeal  Phase Impairments: Suspected delayed Swallow;Throat Clearing - Immediate;Cough - Immediate    Nectar Thick Nectar Thick Liquid: Impaired Presentation: Cup;Self Fed Pharyngeal Phase Impairments: Suspected delayed Swallow;Throat Clearing - Immediate;Cough - Delayed   Honey Thick Honey Thick Liquid: Not tested   Puree Puree:  Impaired Presentation: Spoon Pharyngeal Phase Impairments: Throat Clearing - Delayed;Cough - Delayed   Solid     Solid: Not tested     Ferdinand Lango MA, CCC-SLP  Reiko Vinje [name] 02/05/2024,9:29 AM

## 2024-02-05 NOTE — Evaluation (Signed)
 Speech Language Pathology Evaluation Patient Details Name: Kenneth [name] MRN: [id number] DOB: 11-15-1961 Today's Date: 02/05/2024 Time: 4782-9562 SLP Time Calculation (min) (ACUTE ONLY): 15 min  Problem List:  Patient Active Problem List   Diagnosis Date Noted   Acute focal neurological deficit 02/04/2024   Peripheral neuropathy 08/22/2023   Osteoarthritis of left knee 08/22/2023   BPV (benign positional vertigo) 02/06/2023   Seborrhea 11/07/2022   History of melanoma 04/12/2022   Alcohol dependence in remission (HCC) 04/12/2022   Aortic atherosclerosis (HCC) 04/12/2022   Alcohol-induced chronic pancreatitis (HCC) 03/15/2022   History of CVA (cerebrovascular accident) 02/28/2022   Gastritis and gastroduodenitis    DNR (do not resuscitate) 02/01/2022   Vertigo 01/24/2022   Elevated PSA 11/24/2021   History of immunotherapy 11/25/2019   Melanoma metastatic to brain (HCC) 08/08/2016   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HCC) 08/13/2014   Metastatic melanoma to lung (HCC) 12/12/2012   Type 2 diabetes mellitus (HCC) 08/20/2007   Dyslipidemia 08/20/2007   Essential hypertension 08/19/2007   Past Medical History:  Past Medical History:  Diagnosis Date   Alcohol dependence (HCC)    Chronic calcific pancreatitis (HCC)    CVA (cerebral vascular accident) (HCC) 01/24/2022   Diabetes mellitus without complication (HCC)    Hyperlipidemia    Hypertension    Melanoma (HCC) 2013   metastatic - resected and cured with chemo/immuno tx   Pancreatic cyst-mass?    Polyneuropathy    Past Surgical History:  Past Surgical History:  Procedure Laterality Date   BIOPSY  02/03/2022   Procedure: BIOPSY;  Surgeon: Iva Boop, MD;  Location: [facility] ENDOSCOPY;  Service: Gastroenterology;;   BRAIN SURGERY  2015   to check for possible malignancy from melanoma, result were scar tissue   COLONOSCOPY WITH PROPOFOL N/A 02/03/2022   Procedure: COLONOSCOPY WITH PROPOFOL;  Surgeon: Iva Boop, MD;  Location: [facility]  ENDOSCOPY;  Service: Gastroenterology;  Laterality: N/A;   ESOPHAGOGASTRODUODENOSCOPY (EGD) WITH PROPOFOL N/A 02/03/2022   Procedure: ESOPHAGOGASTRODUODENOSCOPY (EGD) WITH PROPOFOL;  Surgeon: Iva Boop, MD;  Location: [facility] ENDOSCOPY;  Service: Gastroenterology;  Laterality: N/A;   melanoma exicison     right leg   HPI:  Kenneth [name] is a 63 y.o. male with medical history significant of hypertension, hyperlipidemia, gastritis, stroke, diabetes, neuropathy, metastatic melanoma in remission presenting with focal neurologic deficit characterized by right sided weakness and difficulty speaking. Two adjacent acute infarcts within the left corona radiata, left  lentiform nucleus and posterior limb of left internal capsule (individually measuring up to 2 cm).   Assessment / Plan / Recommendation Clinical Impression  Patient presents with moderate dysarthria due to right sided deficits resulting from CN VII, V, and XII dysfunction s/p acute CVA. Per patient, no premorbid dysarthria. Patient is compensating independently at this time with slowed rate, overarticulation, and frequent pausing. Will f/u in the acute care setting for additional treatment focusing on rebuilding oral strength and coordination.    SLP Assessment  SLP Recommendation/Assessment: Patient needs continued Speech Lanaguage Pathology Services SLP Visit Diagnosis: Dysarthria and anarthria (R47.1)    Recommendations for follow up therapy are one component of a multi-disciplinary discharge planning process, led by the attending physician.  Recommendations may be updated based on patient status, additional functional criteria and insurance authorization.    Follow Up Recommendations  Acute inpatient rehab (3hours/day)    Assistance Recommended at Discharge  Intermittent Supervision/Assistance  Functional Status Assessment Patient has had a recent decline in their functional status and demonstrates the  ability to make significant  improvements in function in a reasonable and predictable amount of time.  Frequency and Duration min 1 x/week  1 week      SLP Evaluation Cognition  Overall Cognitive Status: Within Functional Limits for tasks assessed Orientation Level: Oriented X4       Comprehension  Auditory Comprehension Overall Auditory Comprehension: Appears within functional limits for tasks assessed Visual Recognition/Discrimination Discrimination: Within Function Limits Reading Comprehension Reading Status: Within funtional limits    Expression Expression Primary Mode of Expression: Verbal Verbal Expression Overall Verbal Expression: Appears within functional limits for tasks assessed Written Expression Dominant Hand: Right Written Expression:  (NT)   Oral / Motor  Oral Motor/Sensory Function Overall Oral Motor/Sensory Function: Moderate impairment Facial ROM: Reduced right;Suspected CN VII (facial) dysfunction Facial Symmetry: Abnormal symmetry right;Suspected CN VII (facial) dysfunction Facial Strength: Reduced right;Suspected CN VII (facial) dysfunction Facial Sensation: Reduced right;Suspected CN V (Trigeminal) dysfunction Lingual ROM: Reduced right;Suspected CN XII (hypoglossal) dysfunction Lingual Symmetry: Abnormal symmetry right;Suspected CN XII (hypoglossal) dysfunction Lingual Strength: Reduced;Suspected CN XII (hypoglossal) dysfunction Lingual Sensation: Within Functional Limits Velum: Impaired right;Suspected CN X (Vagus) dysfunction Mandible: Within Functional Limits Motor Speech Overall Motor Speech: Impaired Respiration: Within functional limits Phonation: Normal Resonance: Within functional limits Articulation: Impaired Level of Impairment: Word Intelligibility: Intelligibility reduced Word: 50-74% accurate Phrase: 50-74% accurate Sentence: 50-74% accurate Conversation: 50-74% accurate Motor Planning: Witnin functional limits Effective Techniques: Slow rate;Increased vocal  intensity;Over-articulate;Pause           Kenneth Pankowski MA, CCC-SLP  Kenneth [name] Kenneth [name] 02/05/2024, 9:22 AM

## 2024-02-05 NOTE — Plan of Care (Signed)
 Problem: Education: Goal: Knowledge of disease or condition will improve 02/05/2024 0452 by Kenneth Arenas, RN Outcome: Progressing 02/05/2024 0452 by Kenneth Arenas, RN Outcome: Progressing Goal: Knowledge of secondary prevention will improve (MUST DOCUMENT ALL) 02/05/2024 0452 by Kenneth Arenas, RN Outcome: Progressing 02/05/2024 0452 by Kenneth Arenas, RN Outcome: Progressing Goal: Knowledge of patient specific risk factors will improve (DELETE if not current risk factor) 02/05/2024 0452 by Kenneth Arenas, RN Outcome: Progressing 02/05/2024 0452 by Kenneth Arenas, RN Outcome: Progressing   Problem: Ischemic Stroke/TIA Tissue Perfusion: Goal: Complications of ischemic stroke/TIA will be minimized 02/05/2024 0452 by Kenneth Arenas, RN Outcome: Progressing 02/05/2024 0452 by Kenneth Arenas, RN Outcome: Progressing   Problem: Coping: Goal: Will verbalize positive feelings about self 02/05/2024 0452 by Kenneth Arenas, RN Outcome: Progressing 02/05/2024 0452 by Kenneth Arenas, RN Outcome: Progressing Goal: Will identify appropriate support needs 02/05/2024 0452 by Kenneth Arenas, RN Outcome: Progressing 02/05/2024 0452 by Kenneth Arenas, RN Outcome: Progressing   Problem: Health Behavior/Discharge Planning: Goal: Ability to manage health-related needs will improve 02/05/2024 0452 by Kenneth Arenas, RN Outcome: Progressing 02/05/2024 0452 by Kenneth Arenas, RN Outcome: Progressing Goal: Goals will be collaboratively established with patient/family 02/05/2024 [phone] by Kenneth Arenas, RN Outcome: Progressing 02/05/2024 0452 by Kenneth Arenas, RN Outcome: Progressing   Problem: Self-Care: Goal: Ability to participate in self-care as condition permits will improve 02/05/2024 0452 by Kenneth Arenas, RN Outcome: Progressing 02/05/2024 0452 by Kenneth Arenas, RN Outcome: Progressing Goal: Verbalization of feelings and concerns over difficulty with  self-care will improve 02/05/2024 0452 by Kenneth Arenas, RN Outcome: Progressing 02/05/2024 0452 by Kenneth Arenas, RN Outcome: Progressing Goal: Ability to communicate needs accurately will improve 02/05/2024 0452 by Kenneth Arenas, RN Outcome: Progressing 02/05/2024 0452 by Kenneth Arenas, RN Outcome: Progressing   Problem: Nutrition: Goal: Risk of aspiration will decrease 02/05/2024 0452 by Kenneth Arenas, RN Outcome: Progressing 02/05/2024 0452 by Kenneth Arenas, RN Outcome: Progressing Goal: Dietary intake will improve 02/05/2024 0452 by Kenneth Arenas, RN Outcome: Progressing 02/05/2024 0452 by Kenneth Arenas, RN Outcome: Progressing   Problem: Coping: Goal: Ability to adjust to condition or change in health will improve 02/05/2024 0452 by Kenneth Arenas, RN Outcome: Progressing 02/05/2024 0452 by Kenneth Arenas, RN Outcome: Progressing   Problem: Fluid Volume: Goal: Ability to maintain a balanced intake and output will improve 02/05/2024 0452 by Kenneth Arenas, RN Outcome: Progressing 02/05/2024 0452 by Kenneth Arenas, RN Outcome: Progressing   Problem: Health Behavior/Discharge Planning: Goal: Ability to identify and utilize available resources and services will improve 02/05/2024 0452 by Kenneth Arenas, RN Outcome: Progressing 02/05/2024 0452 by Kenneth Arenas, RN Outcome: Progressing Goal: Ability to manage health-related needs will improve 02/05/2024 0452 by Kenneth Arenas, RN Outcome: Progressing 02/05/2024 0452 by Kenneth Arenas, RN Outcome: Progressing   Problem: Metabolic: Goal: Ability to maintain appropriate glucose levels will improve 02/05/2024 0452 by Kenneth Arenas, RN Outcome: Progressing 02/05/2024 0452 by Kenneth Arenas, RN Outcome: Progressing   Problem: Nutritional: Goal: Maintenance of adequate nutrition will improve 02/05/2024 0452 by Kenneth Arenas, RN Outcome: Progressing 02/05/2024 0452 by Kenneth Arenas,  RN Outcome: Not Progressing Goal: Progress toward achieving an optimal weight will improve 02/05/2024 0452 by Kenneth Arenas, RN Outcome: Progressing 02/05/2024 0452 by Kenneth Arenas, RN Outcome: Progressing   Problem: Skin Integrity: Goal: Risk  for impaired skin integrity will decrease 02/05/2024 0452 by Kenneth Arenas, RN Outcome: Progressing 02/05/2024 0452 by Kenneth Arenas, RN Outcome: Progressing   Problem: Tissue Perfusion: Goal: Adequacy of tissue perfusion will improve 02/05/2024 0452 by Kenneth Arenas, RN Outcome: Progressing 02/05/2024 0452 by Kenneth Arenas, RN Outcome: Progressing   Problem: Education: Goal: Knowledge of General Education information will improve Description: Including pain rating scale, medication(s)/side effects and non-pharmacologic comfort measures 02/05/2024 0452 by Kenneth Arenas, RN Outcome: Progressing 02/05/2024 0452 by Kenneth Arenas, RN Outcome: Progressing   Problem: Health Behavior/Discharge Planning: Goal: Ability to manage health-related needs will improve 02/05/2024 0452 by Kenneth Arenas, RN Outcome: Progressing 02/05/2024 0452 by Kenneth Arenas, RN Outcome: Progressing   Problem: Clinical Measurements: Goal: Ability to maintain clinical measurements within normal limits will improve 02/05/2024 0452 by Kenneth Arenas, RN Outcome: Progressing 02/05/2024 0452 by Kenneth Arenas, RN Outcome: Progressing Goal: Will remain free from infection 02/05/2024 0452 by Kenneth Arenas, RN Outcome: Progressing 02/05/2024 0452 by Kenneth Arenas, RN Outcome: Progressing Goal: Diagnostic test results will improve 02/05/2024 0452 by Kenneth Arenas, RN Outcome: Progressing 02/05/2024 0452 by Kenneth Arenas, RN Outcome: Progressing Goal: Respiratory complications will improve 02/05/2024 0452 by Kenneth Arenas, RN Outcome: Progressing 02/05/2024 0452 by Kenneth Arenas, RN Outcome: Progressing Goal: Cardiovascular  complication will be avoided 02/05/2024 0452 by Kenneth Arenas, RN Outcome: Progressing 02/05/2024 0452 by Kenneth Arenas, RN Outcome: Progressing   Problem: Activity: Goal: Risk for activity intolerance will decrease 02/05/2024 0452 by Kenneth Arenas, RN Outcome: Progressing 02/05/2024 0452 by Kenneth Arenas, RN Outcome: Progressing   Problem: Nutrition: Goal: Adequate nutrition will be maintained 02/05/2024 0452 by Kenneth Arenas, RN Outcome: Progressing 02/05/2024 0452 by Kenneth Arenas, RN Outcome: Progressing   Problem: Coping: Goal: Level of anxiety will decrease 02/05/2024 0452 by Kenneth Arenas, RN Outcome: Progressing 02/05/2024 0452 by Kenneth Arenas, RN Outcome: Progressing   Problem: Elimination: Goal: Will not experience complications related to bowel motility 02/05/2024 0452 by Kenneth Arenas, RN Outcome: Progressing 02/05/2024 0452 by Kenneth Arenas, RN Outcome: Progressing Goal: Will not experience complications related to urinary retention 02/05/2024 0452 by Kenneth Arenas, RN Outcome: Progressing 02/05/2024 0452 by Kenneth Arenas, RN Outcome: Progressing   Problem: Pain Managment: Goal: General experience of comfort will improve and/or be controlled 02/05/2024 0452 by Kenneth Arenas, RN Outcome: Progressing 02/05/2024 0452 by Kenneth Arenas, RN Outcome: Progressing   Problem: Safety: Goal: Ability to remain free from injury will improve 02/05/2024 0452 by Kenneth Arenas, RN Outcome: Progressing 02/05/2024 0452 by Kenneth Arenas, RN Outcome: Progressing   Problem: Skin Integrity: Goal: Risk for impaired skin integrity will decrease 02/05/2024 0452 by Kenneth Arenas, RN Outcome: Progressing 02/05/2024 0452 by Kenneth Arenas, RN Outcome: Progressing

## 2024-02-05 NOTE — Plan of Care (Signed)
  Problem: Education: Goal: Knowledge of disease or condition will improve Outcome: Progressing Goal: Knowledge of secondary prevention will improve (MUST DOCUMENT ALL) Outcome: Progressing Goal: Knowledge of patient specific risk factors will improve (DELETE if not current risk factor) Outcome: Progressing   Problem: Ischemic Stroke/TIA Tissue Perfusion: Goal: Complications of ischemic stroke/TIA will be minimized Outcome: Progressing   Problem: Coping: Goal: Will verbalize positive feelings about self Outcome: Progressing Goal: Will identify appropriate support needs Outcome: Progressing   Problem: Health Behavior/Discharge Planning: Goal: Ability to manage health-related needs will improve Outcome: Progressing Goal: Goals will be collaboratively established with patient/family Outcome: Progressing   Problem: Self-Care: Goal: Ability to participate in self-care as condition permits will improve Outcome: Progressing Goal: Verbalization of feelings and concerns over difficulty with self-care will improve Outcome: Progressing Goal: Ability to communicate needs accurately will improve Outcome: Progressing   Problem: Nutrition: Goal: Risk of aspiration will decrease Outcome: Progressing Goal: Dietary intake will improve Outcome: Progressing   Problem: Education: Goal: Ability to describe self-care measures that may prevent or decrease complications (Diabetes Survival Skills Education) will improve Outcome: Progressing Goal: Individualized Educational Video(s) Outcome: Progressing   Problem: Coping: Goal: Ability to adjust to condition or change in health will improve Outcome: Progressing   Problem: Fluid Volume: Goal: Ability to maintain a balanced intake and output will improve Outcome: Progressing   Problem: Health Behavior/Discharge Planning: Goal: Ability to identify and utilize available resources and services will improve Outcome: Progressing Goal: Ability to  manage health-related needs will improve Outcome: Progressing   Problem: Metabolic: Goal: Ability to maintain appropriate glucose levels will improve Outcome: Progressing   Problem: Nutritional: Goal: Progress toward achieving an optimal weight will improve Outcome: Progressing   Problem: Skin Integrity: Goal: Risk for impaired skin integrity will decrease Outcome: Progressing   Problem: Tissue Perfusion: Goal: Adequacy of tissue perfusion will improve Outcome: Progressing   Problem: Education: Goal: Knowledge of General Education information will improve Description: Including pain rating scale, medication(s)/side effects and non-pharmacologic comfort measures Outcome: Progressing   Problem: Health Behavior/Discharge Planning: Goal: Ability to manage health-related needs will improve Outcome: Progressing   Problem: Clinical Measurements: Goal: Ability to maintain clinical measurements within normal limits will improve Outcome: Progressing Goal: Will remain free from infection Outcome: Progressing Goal: Diagnostic test results will improve Outcome: Progressing Goal: Respiratory complications will improve Outcome: Progressing Goal: Cardiovascular complication will be avoided Outcome: Progressing   Problem: Activity: Goal: Risk for activity intolerance will decrease Outcome: Progressing   Problem: Nutrition: Goal: Adequate nutrition will be maintained Outcome: Progressing   Problem: Coping: Goal: Level of anxiety will decrease Outcome: Progressing   Problem: Elimination: Goal: Will not experience complications related to bowel motility Outcome: Progressing Goal: Will not experience complications related to urinary retention Outcome: Progressing   Problem: Pain Managment: Goal: General experience of comfort will improve and/or be controlled Outcome: Progressing   Problem: Safety: Goal: Ability to remain free from injury will improve Outcome: Progressing    Problem: Skin Integrity: Goal: Risk for impaired skin integrity will decrease Outcome: Progressing

## 2024-02-05 NOTE — Progress Notes (Addendum)
 TRH ROUNDING NOTE Kenneth [name]:[id number]  DOB: 09-29-1961  DOA: 02/04/2024  PCP: Loyola Mast, MD  02/05/2024,7:40 AM  LOS: 0 days    Code Status: Full code   from: Home current Dispo: Likely home   63 year old male DM TY 2 HTN HLD Chronic EtOH with chronic pancreatitis Metastatic melanoma to lung and brain in remission since 2016-followed previously [facility] CVA 01/25/2020 right thalamic left pons placed on aspirin Plavix GI bleed on admission 02/2022 EGD showed erythema duodenal erosions-colonoscopy diverticulosis Questionable pancreatic head cyst 3.6 cm with normal CA 19-9  2/3 went to office visit with right lower extremity weakness referred to physical therapy- 3/3 present [facility] slurred speech facial droop right arm tingling starting 11 AM Code stroke activated CT head no acute process CTA head neck no LVO--- MR brain 2 adjacent infarcts left corona radiata left lentiform posterior limb left internal capsule moderate-sized chronic cortical encephalomalacia gliosis   -loaded 300 mg Plavix 75 daily continue aspirin 81 Sodium 139 potassium 3.6 BUN/creatinine 15/0.9 LFTs normal--WBC 7.0 hemoglobin 14.8 platelet 266 A1c 8.8 total cholesterol 90 HDL 39 LDL 41 Neurology consulted  Plan  Acute CVA left corona radiata left lentiform posterior limb internal capsule [prior CVA 2021] Going for MBS significant dysarthria still, still is n.p.o. Has slightly worse deficits on right hand/right upper extremity compared to yesterday so watch avoid hypotension--will place on saline 75 cc/H for 24 hours Further therapy services pending--?  CIR candidate as independent prior to arrival Loaded aspirin Plavix-continue Plavix 75 till 02/24/2024, lifelong aspirin, continue statin 20 mg Lipitor Permissive hypertension Await echocardiogram  DM TY 2 A1c 8.8-PTA glipizide XL, Actos 30, Januvia 100  CBG ranging 100-1 20-PTA Keep sliding scale only at this time  HTN Holding Lotrel 1 capsule daily  metoprolol 100 twice daily for now and we will then gradually resume in abt 1 wk  Mild hypokalemia Replace with K-Dur 40 daily  Bradycardia Heart rates 40s to 50s no pauses May need to adjust home metoprolol when reimplemented  Metastatic melanoma brain resection 2016 Outpatient follow-up with [facility] specialist  Prior GI bleed 2023 Maintained on Protonix 40 daily  Discussed with sister at the bedside    DVT prophylaxis: SCD  Status is: Observation The patient will require care spanning > 2 midnights and should be moved to inpatient because:   Requires further workup   Subjective: Awake coherent still quite dysarthric Right upper extremity feels a little bit weaker than yesterday-dense deficit noted Is coherent otherwise no pain fever chills  Objective + exam Vitals:   02/04/24 1315 02/04/24 1732 02/04/24 1928 02/04/24 2318  BP: (!) 156/76 137/66 (!) 141/93 114/62  Pulse:  (!) 59 (!) 57 (!) 55  Resp:  16 18 16   Temp:  98.1 F (36.7 C) 98.3 F (36.8 C) (!) 97.5 F (36.4 C)  TempSrc:  Oral Oral Oral  SpO2:  99% 99% 99%  Weight:       Filed Weights   02/04/24 1303  Weight: 74.8 kg    Examination: Weakness notable on right upper extremity 4/5 power 5/5 left upper extremity and other major muscle groups Reflexes 2/3 bilaterally Finger-nose-finger is intact Smile is altered tongue protrudes to the right Sensory grossly intact S1-S2 no murmur on monitors bradycardic to the 40s Abdomen soft no rebound Chest is clear no wheeze rales rhonchi  Data Reviewed: reviewed   CBC    Component Value Date/Time   WBC 8.5 02/05/2024 0447   RBC 4.27 02/05/2024  0447   HGB 12.3 (L) 02/05/2024 0447   HCT 36.4 (L) 02/05/2024 0447   PLT 244 02/05/2024 0447   MCV 85.2 02/05/2024 0447   MCH 28.8 02/05/2024 0447   MCHC 33.8 02/05/2024 0447   RDW 14.0 02/05/2024 0447   LYMPHSABS 1.7 02/04/2024 1305   MONOABS 0.8 02/04/2024 1305   EOSABS 0.6 (H) 02/04/2024 1305   BASOSABS 0.1  02/04/2024 1305      Latest Ref Rng & Units 02/05/2024    4:47 AM 02/04/2024    1:11 PM 02/04/2024    1:05 PM  CMP  Glucose 70 - 99 mg/dL 99  696  295   BUN 8 - 23 mg/dL 19  17  15    Creatinine 0.61 - 1.24 mg/dL 2.84  1.32  4.40   Sodium 135 - 145 mmol/L 137  141  139   Potassium 3.5 - 5.1 mmol/L 3.2  3.6  3.6   Chloride 98 - 111 mmol/L 106  105  105   CO2 22 - 32 mmol/L 23   24   Calcium 8.9 - 10.3 mg/dL 8.5   9.2   Total Protein 6.5 - 8.1 g/dL 5.9   7.2   Total Bilirubin 0.0 - 1.2 mg/dL 0.6   0.3   Alkaline Phos 38 - 126 U/L 43   49   AST 15 - 41 U/L 19   19   ALT 0 - 44 U/L 21   22     Scheduled Meds:   stroke: early stages of recovery book   Does not apply Once   aspirin  81 mg Oral Daily   atorvastatin  20 mg Oral Daily   clopidogrel  300 mg Oral Once   Followed by   clopidogrel  75 mg Oral Daily   insulin aspart  0-9 Units Subcutaneous TID WC   Continuous Infusions:  Time  44  Rhetta Mura, MD  [facility]

## 2024-02-05 NOTE — Progress Notes (Signed)
 Modified Barium Swallow Study  Patient Details  Name: Kenneth [name] MRN: [id number] Date of Birth: May 01, 1961  Today's Date: 02/05/2024  Modified Barium Swallow completed.  Full report located under Chart Review in the Imaging Section.  History of Present Illness Kenneth [name] is a 63 y.o. male with medical history significant of hypertension, hyperlipidemia, gastritis, stroke, diabetes, neuropathy, metastatic melanoma in remission presenting with focal neurologic deficit characterized by right sided weakness and difficulty speaking. Two adjacent acute infarcts within the left corona radiata, left  lentiform nucleus and posterior limb of left internal capsule (individually measuring up to 2 cm).   Clinical Impression Pt exhibits moderate oropharyngeal dysphagia suspected to be secondary to cranial nerve deficits. He ultimately has adequate oral control and clearance, but swallow initiation consistently requires a gross collection of each bolus at the level of the pyriform sinuses. Additionally, epiglottic inversion and BOT retraction are incomplete during the swallow which allows boluses to enter the laryngeal vestibule. Thin and nectar thick liquids are silently aspirated (PAS 8). Pt has a strong cough and when cued to do so, is able to clear a significant amount of aspirated material. A chin tuck posture was effective at preventing penetration/aspiration with nectar thick liquids but not with thin liquids. Recommend diet of Dys 3 solids with nectar thick liquids using a chin tuck posture for drinking. Pt may benefit from increased supervision initially to ensure use of compensatory strategies, but suspect he may progress to independence quickly.  DIGEST Swallow Severity Rating*  Safety: 2  Efficiency: 1   Overall Pharyngeal Swallow Severity: 2 (moderate) 1: mild; 2: moderate; 3: severe; 4: profound  *The Dynamic Imaging Grade of Swallowing Toxicity is standardized for the head and neck  cancer population, however, demonstrates promising clinical applications across populations to standardize the clinical rating of pharyngeal swallow safety and severity.  Factors that may increase risk of adverse event in presence of aspiration Rubye Oaks & Clearance Coots 2021): Reduced cognitive function  Swallow Evaluation Recommendations Recommendations: PO diet PO Diet Recommendation: Dysphagia 3 (Mechanical soft);Moderately thick liquids (Level 3, honey thick) Liquid Administration via: Spoon;Cup Medication Administration: Whole meds with puree Supervision: Patient able to self-feed;Intermittent supervision/cueing for swallowing strategies Swallowing strategies  : Minimize environmental distractions;Slow rate;Small bites/sips Postural changes: Position pt fully upright for meals Oral care recommendations: Oral care BID (2x/day) Caregiver Recommendations: Avoid jello, ice cream, thin soups, popsicles;Remove water pitcher      Gwynneth Aliment, M.A., CF-SLP Speech Language Pathology, Acute Rehabilitation Services  Secure Chat preferred [phone]  02/05/2024,4:41 PM

## 2024-02-05 NOTE — Evaluation (Signed)
 Occupational Therapy Evaluation Patient Details Name: Kenneth [name] MRN: [id number] DOB: [birth year] Today's Date: 02/05/2024   History of Present Illness   Kenneth [name] is a 63 y.o. male who presented to [facility] ED with c/o slurred speech, facial droop, and R arm tingling. MRI on 02/04/2024 revealed Two adjacent acute infarcts within the left corona radiata, left  lentiform nucleus and posterior limb of left internal capsule. PMHx includes alcohol dependence, chronic calcific pancreatitis, CVA, DM, HLD, HTN, melanoma, polyneuropathy     Clinical Impressions Pt evaluated s/p admission list above. At baseline, pt lives at home with sister who assists with cooking meals, and completes all ADLs and functional mobility with MOD I. Pt reports using a cane in the community and ambulates without DME within the home. Upon evaluation, pt was limited by R side hemibody sensory deficits, <90 degree shoulder flexion AROM, impaired balance, impaired R hand FM/dexterity, impaired RUE/RLE gross motor coordination and weakness. Overall, pt required up to MIN A for functional mobility tasks. Pt reported increased difficulty grasping RW with R hand during functional ambulation in hallway. Based on evaluation, pt will need up to MOD A for ADLs. OT to continue following pt acutely to address functional needs with discharge recommendations of follow-up OT services >3hrs/day to maximize functional independence.      If plan is discharge home, recommend the following:   A little help with walking and/or transfers;A little help with bathing/dressing/bathroom;Assistance with cooking/housework;Assist for transportation;Help with stairs or ramp for entrance     Functional Status Assessment   Patient has had a recent decline in their functional status and demonstrates the ability to make significant improvements in function in a reasonable and predictable amount of time.     Equipment Recommendations   Other  (comment) (defer)     Recommendations for Other Services   Rehab consult     Precautions/Restrictions   Precautions Precautions: Fall Restrictions Weight Bearing Restrictions Per Provider Order: No     Mobility Bed Mobility Overal bed mobility: Needs Assistance Bed Mobility: Supine to Sit, Sit to Supine     Supine to sit: Contact guard, HOB elevated Sit to supine: Contact guard assist   General bed mobility comments: CGA for safety. Verbal cues provided for sequencing.    Transfers Overall transfer level: Needs assistance Equipment used: Rolling walker (2 wheels) Transfers: Sit to/from Stand Sit to Stand: Min assist           General transfer comment: MIN A for power up to stand. Verbal cues provided for hand placement      Balance Overall balance assessment: Needs assistance Sitting-balance support: Single extremity supported, Feet supported Sitting balance-Leahy Scale: Fair Sitting balance - Comments: Pt engaged in lower body dressing seated EOB   Standing balance support: Bilateral upper extremity supported, Reliant on assistive device for balance, During functional activity Standing balance-Leahy Scale: Poor Standing balance comment: Pt engaged in functional ambulation using RW. Requires use of AD for support.                           ADL either performed or assessed with clinical judgement   ADL Overall ADL's : Needs assistance/impaired Eating/Feeding: Set up;Sitting   Grooming: Set up;Sitting   Upper Body Bathing: Minimal assistance;Sitting   Lower Body Bathing: Minimal assistance;Sit to/from stand   Upper Body Dressing : Minimal assistance;Sitting   Lower Body Dressing: Moderate assistance;Sitting/lateral leans;Sit to/from stand   Toilet Transfer: Minimal assistance;Ambulation;Rolling  walker (2 wheels);Regular Toilet   Toileting- Clothing Manipulation and Hygiene: Minimal assistance;Sit to/from stand       Functional  mobility during ADLs: Minimal assistance;Rolling walker (2 wheels) General ADL Comments: Deficits in sensation and coordination of  RUE/RLE. <90 degree R shoulder flexion AROM and decreased grip strength of R hand. Pt reports difficulty maintaining grasp of RW with R hand. Pt with difficulty grasping and manipulating sock with R hand.     Vision Baseline Vision/History: 1 Wears glasses Ability to See in Adequate Light: 0 Adequate Patient Visual Report: No change from baseline Vision Assessment?: No apparent visual deficits     Perception Perception: Not tested       Praxis Praxis: Not tested       Pertinent Vitals/Pain Pain Assessment Pain Assessment: No/denies pain     Extremity/Trunk Assessment Upper Extremity Assessment Upper Extremity Assessment: Right hand dominant RUE Deficits / Details: <90 degree shoulder flexion AROM. Impaired sensation, gross motor, and FM coordination. Decreased grip strength RUE Sensation: decreased light touch RUE Coordination: decreased fine motor;decreased gross motor LUE Deficits / Details: generalized weakness and residual deficits  from previous CVA LUE Coordination: decreased fine motor   Lower Extremity Assessment Lower Extremity Assessment: RLE deficits/detail RLE Deficits / Details: Grossly 4-/5 on LLE   Cervical / Trunk Assessment Cervical / Trunk Assessment: Kyphotic   Communication Communication Communication: Impaired Factors Affecting Communication: Reduced clarity of speech   Cognition Arousal: Alert Behavior During Therapy: WFL for tasks assessed/performed Cognition: No apparent impairments             OT - Cognition Comments: Pt responded multi-step verbal commands, with insight into deficits, alert throughout session. Sister answered some questions regarding PLOF.                 Following commands: Intact       Cueing  General Comments   Cueing Techniques: Verbal cues  VSS   Exercises      Shoulder Instructions      Home Living Family/patient expects to be discharged to:: Private residence Living Arrangements: Other relatives (sister) Available Help at Discharge: Family Type of Home: House Home Access: Stairs to enter Secretary/administrator of Steps: 3 Entrance Stairs-Rails: Can reach both Home Layout: Two level     Bathroom Shower/Tub: Producer, television/film/video: Standard     Home Equipment: Medical laboratory scientific officer - single point      Lives With: Family    Prior Functioning/Environment Prior Level of Function : Independent/Modified Independent;History of Falls (last six months) (Pt reports 1 fall in the last 6 months)             Mobility Comments: Pt reports using a cane in the community and ambulates without anything within the home ADLs Comments: Completed ADLs independently. Pt prepares breakfast using microwave. Sister assists with cooking dinner.    OT Problem List: Decreased strength;Decreased range of motion;Impaired balance (sitting and/or standing);Decreased coordination;Decreased safety awareness;Decreased knowledge of use of DME or AE;Impaired UE functional use;Impaired sensation   OT Treatment/Interventions: Self-care/ADL training;Neuromuscular education;Therapeutic exercise;DME and/or AE instruction;Therapeutic activities;Balance training;Patient/family education      OT Goals(Current goals can be found in the care plan section)   Acute Rehab OT Goals Patient Stated Goal: none stated OT Goal Formulation: With patient Time For Goal Achievement: 02/19/24 Potential to Achieve Goals: Good ADL Goals Pt Will Perform Grooming: standing;with supervision Pt Will Perform Upper Body Dressing: sitting;with supervision Pt Will Perform Lower Body Dressing: sit to/from stand;with  supervision Pt Will Transfer to Toilet: with supervision;ambulating;regular height toilet Pt Will Perform Toileting - Clothing Manipulation and hygiene: with supervision;sit to/from  stand Pt/caregiver will Perform Home Exercise Program: Increased strength;Right Upper extremity;With theraputty;With written HEP provided;Independently   OT Frequency:  Min 1X/week    Co-evaluation              AM-PAC OT "6 Clicks" Daily Activity     Outcome Measure Help from another person eating meals?: A Little Help from another person taking care of personal grooming?: A Little Help from another person toileting, which includes using toliet, bedpan, or urinal?: A Little Help from another person bathing (including washing, rinsing, drying)?: A Little Help from another person to put on and taking off regular upper body clothing?: A Little Help from another person to put on and taking off regular lower body clothing?: A Lot 6 Click Score: 17   End of Session Equipment Utilized During Treatment: Gait belt;Rolling walker (2 wheels) Nurse Communication: Mobility status  Activity Tolerance: Patient tolerated treatment well Patient left: in bed;with bed alarm set;with family/visitor present;with call bell/phone within reach  OT Visit Diagnosis: Unsteadiness on feet (R26.81);Other abnormalities of gait and mobility (R26.89);Muscle weakness (generalized) (M62.81);Hemiplegia and hemiparesis Hemiplegia - Right/Left: Right Hemiplegia - dominant/non-dominant: Dominant Hemiplegia - caused by: Cerebral infarction                Time: 1145-1201 OT Time Calculation (min): 16 min Charges:  OT General Charges $OT Visit: 1 Visit OT Evaluation $OT Eval Moderate Complexity: 1 [address] MOTS  Tattianna Schnarr 02/05/2024, 2:30 PM

## 2024-02-05 NOTE — TOC Initial Note (Signed)
 Transition of Care [facility]) - Initial/Assessment Note    Patient Details  Name: Kenneth [name] MRN: [id number] Date of Birth: 1961/08/20  [facility] CM/SW Contact:    Kermit Balo, RN Phone Number: 02/05/2024, 4:11 PM  Clinical Narrative:                  Pt is from home with his sister. He stats she works out of the home MWF and from home TTh. He says his younger sister can assist him on MWF.  He denies issues with transportation.  Sister manages his medications and he denies any issues.  Current recommendations are for CIR.  TOC following.  Expected Discharge Plan: IP Rehab Facility Barriers to Discharge: Continued Medical Work up   Patient Goals and CMS Choice   CMS Medicare.gov Compare Post Acute Care list provided to:: Patient Choice offered to / list presented to : Patient      Expected Discharge Plan and Services   Discharge Planning Services: CM Consult Post Acute Care Choice: IP Rehab Living arrangements for the past 2 months: Single Family Home                                      Prior Living Arrangements/Services Living arrangements for the past 2 months: Single Family Home Lives with:: Siblings Patient language and need for interpreter reviewed:: Yes Do you feel safe going back to the place where you live?: Yes        Care giver support system in place?: Yes (comment) Current home services: DME (cane/ walker/ shower seat/ wheelchair) Criminal Activity/Legal Involvement Pertinent to Current Situation/Hospitalization: No - Comment as needed  Activities of Daily Living   ADL Screening (condition at time of admission) Independently performs ADLs?: No Does the patient have a NEW difficulty with bathing/dressing/toileting/self-feeding that is expected to last >3 days?: Yes (Initiates electronic notice to provider for possible OT consult) (stroke workup) Does the patient have a NEW difficulty with getting in/out of bed, walking, or  climbing stairs that is expected to last >3 days?: Yes (Initiates electronic notice to provider for possible PT consult) (stroke workup) Does the patient have a NEW difficulty with communication that is expected to last >3 days?: Yes (Initiates electronic notice to provider for possible SLP consult) (stroke workup) Is the patient deaf or have difficulty hearing?: No Does the patient have difficulty seeing, even when wearing glasses/contacts?: No Does the patient have difficulty concentrating, remembering, or making decisions?: No  Permission Sought/Granted                  Emotional Assessment Appearance:: Appears stated age Attitude/Demeanor/Rapport: Engaged Affect (typically observed): Accepting Orientation: : Oriented to Self, Oriented to Place, Oriented to  Time, Oriented to Situation   Psych Involvement: No (comment)  Admission diagnosis:  Stroke risk [Z91.89] Acute focal neurological deficit [R29.818] Cerebrovascular accident (CVA), unspecified mechanism (HCC) [I63.9] Patient Active Problem List   Diagnosis Date Noted   Acute focal neurological deficit 02/04/2024   Peripheral neuropathy 08/22/2023   Osteoarthritis of left knee 08/22/2023   BPV (benign positional vertigo) 02/06/2023   Seborrhea 11/07/2022   History of melanoma 04/12/2022   Alcohol dependence in remission (HCC) 04/12/2022   Aortic atherosclerosis (HCC) 04/12/2022   Alcohol-induced chronic pancreatitis (HCC) 03/15/2022   History of CVA (cerebrovascular accident) 02/28/2022   Gastritis and gastroduodenitis    DNR (do not  resuscitate) 02/01/2022   Vertigo 01/24/2022   Elevated PSA 11/24/2021   History of immunotherapy 11/25/2019   Melanoma metastatic to brain [facility]) 08/08/2016   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HCC) 08/13/2014   Metastatic melanoma to lung (HCC) 12/12/2012   Type 2 diabetes mellitus (HCC) 08/20/2007   Dyslipidemia 08/20/2007   Essential hypertension 08/19/2007   PCP:  Loyola Mast,  MD Pharmacy:   [facility] [phone] [place], [place] - [address] AT [address] [place] [address] Phone: [phone] Fax: [phone]     Social Drivers of Health (SDOH) Social History: SDOH Screenings   Food Insecurity: No Food Insecurity (02/04/2024)  Housing: Low Risk  (02/04/2024)  Transportation Needs: No Transportation Needs (02/04/2024)  Utilities: Not At Risk (02/04/2024)  Depression (PHQ2-9): Low Risk  (08/22/2023)  Financial Resource Strain: Low Risk  (01/06/2024)  Physical Activity: Unknown (01/06/2024)  Social Connections: Moderately Integrated (02/04/2024)  Tobacco Use: Medium Risk (01/31/2024)   SDOH Interventions:     Readmission Risk Interventions     No data to display

## 2024-02-05 NOTE — Progress Notes (Signed)
 Inpatient Rehab Admissions Coordinator:    CIR consult received. Will need to see how he does with PT/OT.  Megan Salon, MS, CCC-SLP Rehab Admissions Coordinator  [phone] (celll) [phone] (office)

## 2024-02-06 DIAGNOSIS — Z91148 Patient's other noncompliance with medication regimen for other reason: Secondary | ICD-10-CM | POA: Diagnosis not present

## 2024-02-06 DIAGNOSIS — Z85841 Personal history of malignant neoplasm of brain: Secondary | ICD-10-CM | POA: Diagnosis not present

## 2024-02-06 DIAGNOSIS — E1142 Type 2 diabetes mellitus with diabetic polyneuropathy: Secondary | ICD-10-CM | POA: Diagnosis not present

## 2024-02-06 DIAGNOSIS — C7931 Secondary malignant neoplasm of brain: Secondary | ICD-10-CM | POA: Diagnosis not present

## 2024-02-06 DIAGNOSIS — R471 Dysarthria and anarthria: Secondary | ICD-10-CM | POA: Diagnosis present

## 2024-02-06 DIAGNOSIS — Z91048 Other nonmedicinal substance allergy status: Secondary | ICD-10-CM | POA: Diagnosis not present

## 2024-02-06 DIAGNOSIS — Z794 Long term (current) use of insulin: Secondary | ICD-10-CM | POA: Diagnosis not present

## 2024-02-06 DIAGNOSIS — R29702 NIHSS score 2: Secondary | ICD-10-CM | POA: Diagnosis present

## 2024-02-06 DIAGNOSIS — Z85820 Personal history of malignant melanoma of skin: Secondary | ICD-10-CM | POA: Diagnosis not present

## 2024-02-06 DIAGNOSIS — Z8719 Personal history of other diseases of the digestive system: Secondary | ICD-10-CM | POA: Diagnosis not present

## 2024-02-06 DIAGNOSIS — R29818 Other symptoms and signs involving the nervous system: Secondary | ICD-10-CM | POA: Diagnosis not present

## 2024-02-06 DIAGNOSIS — I6381 Other cerebral infarction due to occlusion or stenosis of small artery: Secondary | ICD-10-CM | POA: Diagnosis present

## 2024-02-06 DIAGNOSIS — Z8249 Family history of ischemic heart disease and other diseases of the circulatory system: Secondary | ICD-10-CM | POA: Diagnosis not present

## 2024-02-06 DIAGNOSIS — Z7984 Long term (current) use of oral hypoglycemic drugs: Secondary | ICD-10-CM | POA: Diagnosis not present

## 2024-02-06 DIAGNOSIS — Z7982 Long term (current) use of aspirin: Secondary | ICD-10-CM | POA: Diagnosis not present

## 2024-02-06 DIAGNOSIS — Z9221 Personal history of antineoplastic chemotherapy: Secondary | ICD-10-CM | POA: Diagnosis not present

## 2024-02-06 DIAGNOSIS — I639 Cerebral infarction, unspecified: Secondary | ICD-10-CM | POA: Diagnosis not present

## 2024-02-06 DIAGNOSIS — I63512 Cerebral infarction due to unspecified occlusion or stenosis of left middle cerebral artery: Secondary | ICD-10-CM

## 2024-02-06 DIAGNOSIS — I6621 Occlusion and stenosis of right posterior cerebral artery: Secondary | ICD-10-CM

## 2024-02-06 DIAGNOSIS — I69351 Hemiplegia and hemiparesis following cerebral infarction affecting right dominant side: Secondary | ICD-10-CM | POA: Diagnosis not present

## 2024-02-06 DIAGNOSIS — Z9189 Other specified personal risk factors, not elsewhere classified: Secondary | ICD-10-CM | POA: Diagnosis present

## 2024-02-06 DIAGNOSIS — R29810 Facial weakness: Secondary | ICD-10-CM | POA: Diagnosis present

## 2024-02-06 DIAGNOSIS — E1165 Type 2 diabetes mellitus with hyperglycemia: Secondary | ICD-10-CM

## 2024-02-06 DIAGNOSIS — E1140 Type 2 diabetes mellitus with diabetic neuropathy, unspecified: Secondary | ICD-10-CM | POA: Diagnosis present

## 2024-02-06 DIAGNOSIS — G6289 Other specified polyneuropathies: Secondary | ICD-10-CM | POA: Diagnosis not present

## 2024-02-06 DIAGNOSIS — E785 Hyperlipidemia, unspecified: Secondary | ICD-10-CM | POA: Diagnosis present

## 2024-02-06 DIAGNOSIS — E876 Hypokalemia: Secondary | ICD-10-CM | POA: Diagnosis present

## 2024-02-06 DIAGNOSIS — E119 Type 2 diabetes mellitus without complications: Secondary | ICD-10-CM | POA: Diagnosis not present

## 2024-02-06 DIAGNOSIS — R001 Bradycardia, unspecified: Secondary | ICD-10-CM | POA: Diagnosis present

## 2024-02-06 DIAGNOSIS — Z7902 Long term (current) use of antithrombotics/antiplatelets: Secondary | ICD-10-CM | POA: Diagnosis not present

## 2024-02-06 DIAGNOSIS — Z79899 Other long term (current) drug therapy: Secondary | ICD-10-CM | POA: Diagnosis not present

## 2024-02-06 DIAGNOSIS — R1310 Dysphagia, unspecified: Secondary | ICD-10-CM | POA: Diagnosis not present

## 2024-02-06 DIAGNOSIS — N400 Enlarged prostate without lower urinary tract symptoms: Secondary | ICD-10-CM | POA: Diagnosis not present

## 2024-02-06 DIAGNOSIS — L219 Seborrheic dermatitis, unspecified: Secondary | ICD-10-CM | POA: Diagnosis not present

## 2024-02-06 DIAGNOSIS — Z8673 Personal history of transient ischemic attack (TIA), and cerebral infarction without residual deficits: Secondary | ICD-10-CM | POA: Diagnosis not present

## 2024-02-06 DIAGNOSIS — C7800 Secondary malignant neoplasm of unspecified lung: Secondary | ICD-10-CM | POA: Diagnosis not present

## 2024-02-06 DIAGNOSIS — Z87891 Personal history of nicotine dependence: Secondary | ICD-10-CM | POA: Diagnosis not present

## 2024-02-06 DIAGNOSIS — G8191 Hemiplegia, unspecified affecting right dominant side: Secondary | ICD-10-CM | POA: Diagnosis present

## 2024-02-06 DIAGNOSIS — I10 Essential (primary) hypertension: Secondary | ICD-10-CM | POA: Diagnosis present

## 2024-02-06 LAB — BASIC METABOLIC PANEL
Anion gap: 4 — ABNORMAL LOW (ref 5–15)
BUN: 11 mg/dL (ref 8–23)
CO2: 24 mmol/L (ref 22–32)
Calcium: 8.3 mg/dL — ABNORMAL LOW (ref 8.9–10.3)
Chloride: 110 mmol/L (ref 98–111)
Creatinine, Ser: 1.03 mg/dL (ref 0.61–1.24)
GFR, Estimated: >60 mL/min (ref >60)
Glucose, Bld: 104 mg/dL — ABNORMAL HIGH (ref 70–99)
Potassium: 3.3 mmol/L — ABNORMAL LOW (ref 3.5–5.1)
Sodium: 138 mmol/L (ref 135–145)

## 2024-02-06 LAB — GLUCOSE, CAPILLARY
Glucose-Capillary: 107 mg/dL — ABNORMAL HIGH (ref 70–99)
Glucose-Capillary: 96 mg/dL (ref 70–99)
Glucose-Capillary: 146 mg/dL — ABNORMAL HIGH (ref 70–99)
Glucose-Capillary: 226 mg/dL — ABNORMAL HIGH (ref 70–99)
Glucose-Capillary: 142 mg/dL — ABNORMAL HIGH (ref 70–99)

## 2024-02-06 LAB — MAGNESIUM: Magnesium: 1.5 mg/dL — ABNORMAL LOW (ref 1.7–2.4)

## 2024-02-06 MED ORDER — POTASSIUM CHLORIDE 20 MEQ PO PACK
60.0000 meq | PACK | Freq: Once | ORAL | Status: AC
  Administered 2024-02-06: 60 meq via ORAL
  Filled 2024-02-06: qty 3

## 2024-02-06 MED ORDER — CLOPIDOGREL BISULFATE 75 MG PO TABS
75.0000 mg | ORAL_TABLET | Freq: Every day | ORAL | Status: DC
  Administered 2024-02-07 – 2024-02-08 (×2): 75 mg via ORAL
  Filled 2024-02-06 (×2): qty 1

## 2024-02-06 MED ORDER — TAMSULOSIN HCL 0.4 MG PO CAPS
0.4000 mg | ORAL_CAPSULE | Freq: Every day | ORAL | Status: DC
  Administered 2024-02-06 – 2024-02-07 (×2): 0.4 mg via ORAL
  Filled 2024-02-06 (×2): qty 1

## 2024-02-06 MED ORDER — MIRTAZAPINE 15 MG PO TABS
7.5000 mg | ORAL_TABLET | Freq: Every day | ORAL | Status: DC
  Administered 2024-02-06 – 2024-02-07 (×2): 7.5 mg via ORAL
  Filled 2024-02-06 (×2): qty 1

## 2024-02-06 MED ORDER — CLOPIDOGREL BISULFATE 75 MG PO TABS
300.0000 mg | ORAL_TABLET | Freq: Once | ORAL | Status: AC
Start: 2024-02-06 — End: 2024-02-06
  Administered 2024-02-06: 300 mg via ORAL
  Filled 2024-02-06: qty 4

## 2024-02-06 MED ORDER — AMLODIPINE BESYLATE 5 MG PO TABS
10.0000 mg | ORAL_TABLET | Freq: Every day | ORAL | Status: DC
  Administered 2024-02-06 – 2024-02-07 (×2): 10 mg via ORAL
  Filled 2024-02-06 (×2): qty 2

## 2024-02-06 MED ORDER — METOPROLOL TARTRATE 50 MG PO TABS: 100.0000 mg | ORAL_TABLET | Freq: Two times a day (BID) | ORAL | Status: DC

## 2024-02-06 MED ORDER — MAGNESIUM SULFATE 4 GM/100ML IV SOLN
4.0000 g | Freq: Once | INTRAVENOUS | Status: AC
  Administered 2024-02-06: 4 g via INTRAVENOUS
  Filled 2024-02-06: qty 100

## 2024-02-06 NOTE — Plan of Care (Signed)

## 2024-02-06 NOTE — Progress Notes (Signed)
 STROKE TEAM PROGRESS NOTE   SUBJECTIVE (INTERVAL HISTORY) His sister is at the bedside.  Overall his condition is gradually improving.  He still has right facial droop, mild dysarthria and right hemiparesis, but improving.  PT and OT recommend CIR.   OBJECTIVE Temp:  [97.7 F (36.5 C)-98.5 F (36.9 C)] (P) 97.7 F (36.5 C) (03/05 1603) Pulse Rate:  [65-84] (P) 84 (03/05 1603) Cardiac Rhythm: Normal sinus rhythm;Heart block (03/05 0752) Resp:  [17-18] (P) 17 (03/05 1603) BP: (132-165)/(67-75) (P) 162/76 (03/05 1603) SpO2:  [92 %-98 %] (P) 92 % (03/05 1603)  Recent Labs  Lab 02/05/24 2116 02/06/24 0629 02/06/24 0723 02/06/24 1105 02/06/24 1559  GLUCAP 165* 107* 96 146* 226*   Recent Labs  Lab 02/04/24 1305 02/04/24 1311 02/05/24 0447 02/06/24 0640  NA 139 141 137 138  K 3.6 3.6 3.2* 3.3*  CL 105 105 106 110  CO2 24  --  23 24  GLUCOSE 137* 134* 99 104*  BUN 15 17 19 11   CREATININE 0.94 1.00 1.07 1.03  CALCIUM 9.2  --  8.5* 8.3*  MG  --   --   --  1.5*   Recent Labs  Lab 02/04/24 1305 02/05/24 0447  AST 19 19  ALT 22 21  ALKPHOS 49 43  BILITOT 0.3 0.6  PROT 7.2 5.9*  ALBUMIN 3.9 3.1*   Recent Labs  Lab 02/04/24 1305 02/04/24 1311 02/05/24 0447  WBC 7.0  --  8.5  NEUTROABS 3.9  --   --   HGB 14.8 15.0 12.3*  HCT 43.1 44.0 36.4*  MCV 86.4  --  85.2  PLT 266  --  244   No results for input(s): "CKTOTAL", "CKMB", "CKMBINDEX", "TROPONINI" in the last 168 hours. Recent Labs    02/04/24 1305  LABPROT 13.9  INR 1.1   No results for input(s): "COLORURINE", "LABSPEC", "PHURINE", "GLUCOSEU", "HGBUR", "BILIRUBINUR", "KETONESUR", "PROTEINUR", "UROBILINOGEN", "NITRITE", "LEUKOCYTESUR" in the last 72 hours.  Invalid input(s): "APPERANCEUR"     Component Value Date/Time   CHOL 90 02/05/2024 0447   TRIG 48 02/05/2024 0447   HDL 39 (L) 02/05/2024 0447   CHOLHDL 2.3 02/05/2024 0447   VLDL 10 02/05/2024 0447   LDLCALC 41 02/05/2024 0447   LDLCALC 44  11/11/2021 1600   Lab Results  Component Value Date   HGBA1C 8.8 (H) 02/05/2024      Component Value Date/Time   LABOPIA NONE DETECTED 01/25/2022 1612   COCAINSCRNUR NONE DETECTED 01/25/2022 1612   LABBENZ NONE DETECTED 01/25/2022 1612   AMPHETMU NONE DETECTED 01/25/2022 1612   THCU NONE DETECTED 01/25/2022 1612   LABBARB NONE DETECTED 01/25/2022 1612    Recent Labs  Lab 02/04/24 1305  ETH <10    I have personally reviewed the radiological images below and agree with the radiology interpretations.  DG Swallowing Func-Speech Pathology Result Date: 02/05/2024 Table formatting from the original result was not included. Modified Barium Swallow Study Patient Details Name: ELIAZ FOUT MRN: [id number] Date of Birth: Jul 31, 1961 Today's Date: 02/05/2024 HPI/PMH: HPI: RONN SMOLINSKY is a 63 y.o. male with medical history significant of hypertension, hyperlipidemia, gastritis, stroke, diabetes, neuropathy, metastatic melanoma in remission presenting with focal neurologic deficit characterized by right sided weakness and difficulty speaking. Two adjacent acute infarcts within the left corona radiata, left  lentiform nucleus and posterior limb of left internal capsule (individually measuring up to 2 cm). Clinical Impression: Clinical Impression: Pt exhibits moderate oropharyngeal dysphagia suspected to be secondary to cranial nerve  deficits. He ultimately has adequate oral control and clearance, but swallow initiation consistently requires a gross collection of each bolus at the level of the pyriform sinuses. Additionally, epiglottic inversion and BOT retraction are incomplete during the swallow which allows boluses to enter the laryngeal vestibule. Thin and nectar thick liquids are silently aspirated (PAS 8). Pt has a strong cough and when cued to do so, is able to clear a significant amount of aspirated material. A chin tuck posture was effective at preventing penetration/aspiration with nectar thick  liquids but not with thin liquids. Recommend diet of Dys 3 solids with nectar thick liquids using a chin tuck posture for drinking. Pt may benefit from increased supervision initially to ensure use of compensatory strategies, but suspect he may progress to independence quickly. DIGEST Swallow Severity Rating*  Safety: 2  Efficiency: 1  Overall Pharyngeal Swallow Severity: 2 (moderate) 1: mild; 2: moderate; 3: severe; 4: profound *The Dynamic Imaging Grade of Swallowing Toxicity is standardized for the head and neck cancer population, however, demonstrates promising clinical applications across populations to standardize the clinical rating of pharyngeal swallow safety and severity. Factors that may increase risk of adverse event in presence of aspiration Rubye Oaks & Clearance Coots 2021): Factors that may increase risk of adverse event in presence of aspiration Rubye Oaks & Clearance Coots 2021): Reduced cognitive function Recommendations/Plan: Swallowing Evaluation Recommendations Swallowing Evaluation Recommendations Recommendations: PO diet PO Diet Recommendation: Dysphagia 3 (Mechanical soft); Moderately thick liquids (Level 3, honey thick) Liquid Administration via: Spoon; Cup Medication Administration: Whole meds with puree Supervision: Patient able to self-feed; Intermittent supervision/cueing for swallowing strategies Swallowing strategies  : Minimize environmental distractions; Slow rate; Small bites/sips Postural changes: Position pt fully upright for meals Oral care recommendations: Oral care BID (2x/day) Caregiver Recommendations: Avoid jello, ice cream, thin soups, popsicles; Remove water pitcher Treatment Plan Treatment Plan Treatment recommendations: Therapy as outlined in treatment plan below Follow-up recommendations: Acute inpatient rehab (3 hours/day) Functional status assessment: Patient has had a recent decline in their functional status and demonstrates the ability to make significant improvements in function in a  reasonable and predictable amount of time. Treatment frequency: Min 2x/week Treatment duration: 2 weeks Interventions: Aspiration precaution training; Compensatory techniques; Patient/family education; Trials of upgraded texture/liquids; Diet toleration management by SLP Recommendations Recommendations for follow up therapy are one component of a multi-disciplinary discharge planning process, led by the attending physician.  Recommendations may be updated based on patient status, additional functional criteria and insurance authorization. Assessment: Orofacial Exam: Orofacial Exam Oral Cavity: Oral Hygiene: WFL Oral Cavity - Dentition: Adequate natural dentition Orofacial Anatomy: WFL Oral Motor/Sensory Function: Suspected cranial nerve impairment CN V - Trigeminal: Right motor impairment; Right sensory impairment CN VII - Facial: Right motor impairment CN IX - Glossopharyngeal, CN X - Vagus: WFL CN XII - Hypoglossal: Right motor impairment Anatomy: Anatomy: Suspected cervical osteophytes Boluses Administered: Boluses Administered Boluses Administered: Thin liquids (Level 0); Mildly thick liquids (Level 2, nectar thick); Moderately thick liquids (Level 3, honey thick); Puree; Solid  Oral Impairment Domain: Oral Impairment Domain Lip Closure: No labial escape Tongue control during bolus hold: Posterior escape of greater than half of bolus Bolus preparation/mastication: Timely and efficient chewing and mashing Bolus transport/lingual motion: Brisk tongue motion Oral residue: Complete oral clearance Location of oral residue : N/A Initiation of pharyngeal swallow : Pyriform sinuses  Pharyngeal Impairment Domain: Pharyngeal Impairment Domain Soft palate elevation: No bolus between soft palate (SP)/pharyngeal wall (PW) Laryngeal elevation: Complete superior movement of thyroid cartilage with complete approximation  of arytenoids to epiglottic petiole Anterior hyoid excursion: Complete anterior movement Epiglottic movement:  No inversion Laryngeal vestibule closure: Incomplete, narrow column air/contrast in laryngeal vestibule Pharyngeal stripping wave : Present - complete Pharyngeal contraction (A/P view only): N/A Pharyngoesophageal segment opening: Complete distension and complete duration, no obstruction of flow Tongue base retraction: Narrow column of contrast or air between tongue base and PPW Pharyngeal residue: Trace residue within or on pharyngeal structures Location of pharyngeal residue: Tongue base; Valleculae; Pharyngeal wall  Esophageal Impairment Domain: No data recorded Pill: No data recorded Penetration/Aspiration Scale Score: Penetration/Aspiration Scale Score 1.  Material does not enter airway: Puree; Solid 2.  Material enters airway, remains ABOVE vocal cords then ejected out: Moderately thick liquids (Level 3, honey thick) 8.  Material enters airway, passes BELOW cords without attempt by patient to eject out (silent aspiration) : Thin liquids (Level 0); Mildly thick liquids (Level 2, nectar thick) Compensatory Strategies: Compensatory Strategies Compensatory strategies: Yes Straw: Ineffective Ineffective Straw: Thin liquid (Level 0); Mildly thick liquid (Level 2, nectar thick) Chin tuck: Effective; Ineffective Effective Chin Tuck: Mildly thick liquid (Level 2, nectar thick) Ineffective Chin Tuck: Thin liquid (Level 0)   General Information: Caregiver present: No  Diet Prior to this Study: NPO   Temperature : Normal   Respiratory Status: WFL   Supplemental O2: None (Room air)   History of Recent Intubation: No  Behavior/Cognition: Alert; Cooperative; Pleasant mood Self-Feeding Abilities: Able to self-feed Baseline vocal quality/speech: Normal Volitional Cough: Able to elicit Volitional Swallow: Able to elicit Exam Limitations: No limitations Goal Planning: Prognosis for improved oropharyngeal function: Good Barriers to Reach Goals: Cognitive deficits No data recorded Patient/Family Stated Goal: none stated Consulted  and agree with results and recommendations: Patient Pain: Pain Assessment Pain Assessment: No/denies pain End of Session: Start Time:SLP Start Time (ACUTE ONLY): 1453 Stop Time: SLP Stop Time (ACUTE ONLY): 1513 Time Calculation:SLP Time Calculation (min) (ACUTE ONLY): 20 min Charges: SLP Evaluations $ SLP Speech Visit: 1 Visit SLP Evaluations $BSS Swallow: 1 Procedure $MBS Swallow: 1 Procedure $ SLP EVAL LANGUAGE/SOUND PRODUCTION: 1 Procedure SLP visit diagnosis: SLP Visit Diagnosis: Dysphagia, oropharyngeal phase (R13.12) Past Medical History: Past Medical History: Diagnosis Date  Alcohol dependence (HCC)   Chronic calcific pancreatitis (HCC)   CVA (cerebral vascular accident) (HCC) 01/24/2022  Diabetes mellitus without complication (HCC)   Hyperlipidemia   Hypertension   Melanoma (HCC) 2013  metastatic - resected and cured with chemo/immuno tx  Pancreatic cyst-mass?   Polyneuropathy  Past Surgical History: Past Surgical History: Procedure Laterality Date  BIOPSY  02/03/2022  Procedure: BIOPSY;  Surgeon: Iva Boop, MD;  Location[facility];  Service: Gastroenterology;;  BRAIN SURGERY  2015  to check for possible malignancy from melanoma, result were scar tissue  COLONOSCOPY WITH PROPOFOL N/A 02/03/2022  Procedure: COLONOSCOPY WITH PROPOFOL;  Surgeon: Iva Boop, MD;  Location: [facility] ENDOSCOPY;  Service: Gastroenterology;  Laterality: N/A;  ESOPHAGOGASTRODUODENOSCOPY (EGD) WITH PROPOFOL N/A 02/03/2022  Procedure: ESOPHAGOGASTRODUODENOSCOPY (EGD) WITH PROPOFOL;  Surgeon: Iva Boop, MD;  Location: [facility] ENDOSCOPY;  Service: Gastroenterology;  Laterality: N/A;  melanoma exicison    right leg Gwynneth Aliment, M.A., CF-SLP Speech Language Pathology, Acute Rehabilitation Services Secure Chat preferred [phone] 02/05/2024, 4:43 PM  ECHOCARDIOGRAM COMPLETE Result Date: 02/05/2024    ECHOCARDIOGRAM REPORT   Patient Name:   RAMOND DARNELL Date of Exam: 02/05/2024 Medical Rec #:  [id number]        Height:        64.0 in  Accession #:    [id number]       Weight:       165.0 lb Date of Birth:  Jul 05, 1961         BSA:          1.803 m Patient Age:    62 years         BP:           114/62 mmHg Patient Gender: M                HR:           64 bpm. Exam Location:  Inpatient Procedure: 2D Echo, Color Doppler and Cardiac Doppler (Both Spectral and Color            Flow Doppler were utilized during procedure). Indications:    Stroke I63.9  History:        Patient has prior history of Echocardiogram examinations, most                 recent 01/25/2022. Risk Factors:Diabetes and Hypertension.  Sonographer:    Webb Laws Referring Phys: [id number] ALEXANDER B MELVIN IMPRESSIONS  1. Left ventricular ejection fraction, by estimation, is 60 to 65%. The left ventricle has normal function. The left ventricle has no regional wall motion abnormalities. Left ventricular diastolic parameters are consistent with Grade I diastolic dysfunction (impaired relaxation).  2. Right ventricular systolic function is normal. The right ventricular size is normal. Tricuspid regurgitation signal is inadequate for assessing PA pressure.  3. The mitral valve is grossly normal. Trivial mitral valve regurgitation. No evidence of mitral stenosis.  4. The aortic valve is tricuspid. Aortic valve regurgitation is not visualized. No aortic stenosis is present.  5. The inferior vena cava is normal in size with greater than 50% respiratory variability, suggesting right atrial pressure of 3 mmHg. Conclusion(s)/Recommendation(s): No intracardiac source of embolism detected on this transthoracic study. Consider a transesophageal echocardiogram to exclude cardiac source of embolism if clinically indicated. FINDINGS  Left Ventricle: Left ventricular ejection fraction, by estimation, is 60 to 65%. The left ventricle has normal function. The left ventricle has no regional wall motion abnormalities. The left ventricular internal cavity size was normal in size. There is  no  left ventricular hypertrophy. Left ventricular diastolic parameters are consistent with Grade I diastolic dysfunction (impaired relaxation). Right Ventricle: The right ventricular size is normal. No increase in right ventricular wall thickness. Right ventricular systolic function is normal. Tricuspid regurgitation signal is inadequate for assessing PA pressure. Left Atrium: Left atrial size was normal in size. Right Atrium: Right atrial size was normal in size. Pericardium: Trivial pericardial effusion is present. Presence of epicardial fat layer. Mitral Valve: The mitral valve is grossly normal. Trivial mitral valve regurgitation. No evidence of mitral valve stenosis. Tricuspid Valve: The tricuspid valve is grossly normal. Tricuspid valve regurgitation is trivial. No evidence of tricuspid stenosis. Aortic Valve: The aortic valve is tricuspid. Aortic valve regurgitation is not visualized. No aortic stenosis is present. Pulmonic Valve: The pulmonic valve was grossly normal. Pulmonic valve regurgitation is not visualized. No evidence of pulmonic stenosis. Aorta: The aortic root and ascending aorta are structurally normal, with no evidence of dilitation. Venous: The inferior vena cava is normal in size with greater than 50% respiratory variability, suggesting right atrial pressure of 3 mmHg. IAS/Shunts: The atrial septum is grossly normal.  LEFT VENTRICLE PLAX 2D LVIDd:         4.70 cm     Diastology LVIDs:  2.50 cm     LV e' medial:    6.64 cm/s LV PW:         1.20 cm     LV E/e' medial:  12.4 LV IVS:        1.10 cm     LV e' lateral:   6.64 cm/s LVOT diam:     2.20 cm     LV E/e' lateral: 12.4 LV SV:         84 LV SV Index:   47 LVOT Area:     3.80 cm  LV Volumes (MOD) LV vol d, MOD A2C: 67.8 ml LV vol d, MOD A4C: 76.3 ml LV vol s, MOD A2C: 23.3 ml LV vol s, MOD A4C: 25.8 ml LV SV MOD A2C:     44.5 ml LV SV MOD A4C:     76.3 ml LV SV MOD BP:      46.6 ml RIGHT VENTRICLE             IVC RV Basal diam:  3.60 cm      IVC diam: 1.10 cm RV S prime:     14.80 cm/s TAPSE (M-mode): 2.8 cm LEFT ATRIUM             Index        RIGHT ATRIUM           Index LA diam:        3.30 cm 1.83 cm/m   RA Area:     12.60 cm LA Vol (A2C):   17.9 ml 9.93 ml/m   RA Volume:   25.60 ml  14.20 ml/m LA Vol (A4C):   25.9 ml 14.37 ml/m LA Biplane Vol: 22.9 ml 12.70 ml/m  AORTIC VALVE LVOT Vmax:   111.00 cm/s LVOT Vmean:  72.000 cm/s LVOT VTI:    0.222 m  AORTA Ao Root diam: 3.60 cm Ao Asc diam:  3.20 cm MITRAL VALVE MV Area (PHT): 3.30 cm    SHUNTS MV Decel Time: 230 msec    Systemic VTI:  0.22 m MV E velocity: 82.57 cm/s  Systemic Diam: 2.20 cm MV A velocity: 95.90 cm/s MV E/A ratio:  0.86 Lennie Odor MD Electronically signed by Lennie Odor MD Signature Date/Time: 02/05/2024/1:35:52 PM    Final    MR BRAIN WO CONTRAST Result Date: 02/04/2024 CLINICAL DATA:  Provided history: Neuro deficit, acute, stroke suspected. EXAM: MRI HEAD WITHOUT CONTRAST TECHNIQUE: Multiplanar, multiecho pulse sequences of the brain and surrounding structures were obtained without intravenous contrast. COMPARISON:  Non-contrast head CT and CT angiogram head/neck performed earlier today 02/04/2024. Brain MRI 11/04/2022. FINDINGS: Mild intermittent motion degradation. Brain: Mild-to-moderate generalized cerebral atrophy. Two adjacent acute infarcts within the left corona radiata, left lentiform nucleus and posterior limb of left internal capsule (individually measuring up to 2 cm). Moderate-sized focus of chronic encephalomalacia/gliosis again demonstrated within the anterolateral left frontal lobe (deep to a cranioplasty). Chronic lacunar infarcts within the bilateral cerebral hemispheric white matter, within/about the bilateral deep gray nuclei and within the left aspect of the pons. Background moderate multifocal T2 FLAIR hyperintense signal abnormality within the cerebral white matter and pons, nonspecific but compatible with chronic small vessel ischemic disease.  Several chronic microhemorrhages within the supratentorial and infratentorial brain, in a distribution suggesting sequelae of chronic hypertensive microangiopathy. No evidence of an intracranial mass. No extra-axial fluid collection. No midline shift. Vascular: Maintained flow voids within the proximal large arterial vessels. Skull and upper cervical spine: Left pterional cranioplasty.  No focal worrisome marrow lesion. Sinuses/Orbits: No mass or acute finding within the imaged orbits. Mild mucosal thickening or small mucous retention cyst within the left maxillary sinus inferiorly. IMPRESSION: 1. Two adjacent acute infarcts within the left corona radiata, left lentiform nucleus and posterior limb of left internal capsule (individually measuring up to 2 cm). 2. Moderate-sized focus of chronic cortical encephalomalacia/gliosis within the anterolateral left frontal lobe (deep to a cranioplasty). 3. Background parenchymal atrophy, chronic small vessel ischemic disease and chronic lacunar infarcts, as described. 4. Several chronic microhemorrhages in a distribution suggesting sequelae of hypertensive microangiopathy. 5. Mild inflammatory left maxillary sinus disease. Electronically Signed   By: Jackey Loge D.O.   On: 02/04/2024 18:20   DG Chest Portable 1 View Result Date: 02/04/2024 CLINICAL DATA:  CVA EXAM: PORTABLE CHEST 1 VIEW COMPARISON:  01/25/2022 FINDINGS: No consolidation, pneumothorax or effusion. No edema. Normal cardiopericardial silhouette. Overlapping cardiac leads. Calcified aorta. IMPRESSION: No acute cardiopulmonary disease. Electronically Signed   By: Karen Kays M.D.   On: 02/04/2024 16:31   CT ANGIO HEAD NECK W WO CM (CODE STROKE) Result Date: 02/04/2024 CLINICAL DATA:  Provided history: Stroke code. Slurred speech, facial droop, weakness. EXAM: CT ANGIOGRAPHY HEAD AND NECK WITH AND WITHOUT CONTRAST TECHNIQUE: Multidetector CT imaging of the head and neck was performed using the standard  protocol during bolus administration of intravenous contrast. Multiplanar CT image reconstructions and MIPs were obtained to evaluate the vascular anatomy. Carotid stenosis measurements (when applicable) are obtained utilizing NASCET criteria, using the distal internal carotid diameter as the denominator. RADIATION DOSE REDUCTION: This exam was performed according to the departmental dose-optimization program which includes automated exposure control, adjustment of the mA and/or kV according to patient size and/or use of iterative reconstruction technique. CONTRAST:  75mL OMNIPAQUE IOHEXOL 350 MG/ML SOLN COMPARISON:  Non-contrast head CT performed earlier today 02/04/2024. FINDINGS: CTA NECK FINDINGS Aortic arch: Aberrant right subclavian artery (anatomic variant). Atherosclerotic plaque within the visualized thoracic aorta and proximal major branch vessels of the neck. Streak/beam hardening artifact arising from a dense contrast bolus partially obscures the left subclavian artery. Within this limitation, there is no appreciable hemodynamically significant innominate or proximal subclavian artery stenosis. Right carotid system: CCA and ICA patent within the neck without measurable stenosis. Minimal atherosclerotic plaque within the mid CCA and carotid bulb. Left carotid system: CCA and ICA patent within the neck without measurable stenosis. Minimal atherosclerotic plaque about the carotid bifurcation. Vertebral arteries: The right vertebral artery is non dominant and developmentally diminutive, but patent throughout the neck the dominant left vertebral artery is patent within the neck. Nonstenotic atherosclerotic plaque within the V1 and V2 segments. Skeleton: Cervical spondylosis. Other neck: No neck mass or cervical lymphadenopathy. Upper chest: No consolidation within the imaged lung apices. Review of the MIP images confirms the above findings CTA HEAD FINDINGS Anterior circulation: The intracranial internal  carotid arteries are patent. Atherosclerotic plaque within both vessels with no more than mild stenosis. The M1 middle cerebral arteries are patent. Severe stenosis at the origin of a left M2 MCA vessel (series 13, image 40). No M2 proximal branch occlusion is identified. The anterior cerebral arteries are patent. No intracranial aneurysm is identified. Posterior circulation: The right vertebral artery remains developmentally diminutive intracranially, but patent. Patent intracranial left vertebral artery without stenosis. The basilar artery is patent. The posterior cerebral arteries are patent. Severe stenosis within the right PCA P3 segment (series 11, image 23) (series 13, image 30). Posterior communicating arteries are present bilaterally.  Venous sinuses: Within the limitations of contrast timing, no convincing thrombus. Anatomic variants: As described. Review of the MIP images confirms the above findings No emergent large vessel occlusion identified. This result, and CTA head impressions #3 and #4 communicated to Dr. Iver Nestle at 2:16 pmon 3/3/2025by text page via the [employer] messaging system. IMPRESSION: CTA neck: 1. The common carotid and internal carotid arteries are patent within the neck without stenosis. Mild atherosclerotic plaque bilaterally, as described. 2. The right vertebral artery is non-dominant and developmentally diminutive, but patent throughout the neck. 3. The dominant left vertebral artery is patent within the neck without stenosis. 4. Aortic Atherosclerosis (ICD10-I70.0). 5. Aberrant right subclavian artery (anatomic variant). CTA head: 1. No proximal intracranial large vessel occlusion is identified. 2. Intracranial atherosclerotic disease with multifocal stenoses, most notably as follows. 3. Severe stenosis at the origin of an M2 left middle cerebral artery vessel. 4. Severe stenosis within the right posterior cerebral artery P3 segment. Electronically Signed   By: Jackey Loge D.O.   On:  02/04/2024 14:17   CT HEAD CODE STROKE WO CONTRAST Result Date: 02/04/2024 CLINICAL DATA:  Code stroke. Provided history: Neuro deficit, acute, stroke suspected. Slurred speech. Facial droop. Weakness. EXAM: CT HEAD WITHOUT CONTRAST TECHNIQUE: Contiguous axial images were obtained from the base of the skull through the vertex without intravenous contrast. RADIATION DOSE REDUCTION: This exam was performed according to the departmental dose-optimization program which includes automated exposure control, adjustment of the mA and/or kV according to patient size and/or use of iterative reconstruction technique. COMPARISON:  Brain MRI 11/04/2022. FINDINGS: Brain: Generalized cerebral atrophy. Moderate-sized focus of chronic encephalomalacia/gliosis again demonstrated within the anterolateral left frontal lobe (underlying a cranioplasty). Chronic lacunar infarcts within/about the bilateral deep gray nuclei and within the left aspect of the pons. As compared to the prior brain MRI of 11/04/2022, no new acute lacunar infarct is identified. Background moderate patchy and ill-defined hypoattenuation within the cerebral white matter, nonspecific but compatible chronic small vessel disease. There is no acute intracranial hemorrhage. No acute demarcated cortical infarct. No extra-axial fluid collection. No evidence of an intracranial mass. No midline shift. Vascular: No hyperdense vessel.  Atherosclerotic calcifications. Skull: No acute calvarial fracture.  Left pterional cranioplasty. Sinuses/Orbits: No mass or acute finding within the imaged orbits. Mild mucosal thickening or small mucous retention cyst within the left maxillary sinus inferiorly. ASPECTS [facility] Stroke Program Early CT Score) - Ganglionic level infarction (caudate, lentiform nuclei, internal capsule, insula, M1-M3 cortex): 7 - Supraganglionic infarction (M4-M6 cortex): 3 Total score (0-10 with 10 being normal): 10 (when discounting chronic infarcts). No  evidence of an acute intracranial abnormality. These results were communicated to Bhagat at 1:55 pmon 3/3/2025by text page via the [facility] messaging system. IMPRESSION: 1. No evidence of an acute intracranial abnormality. 2. Moderate-sized focus of chronic encephalomalacia/gliosis within the anterolateral left frontal lobe (underlying a cranioplasty). 3. Background parenchymal atrophy, chronic small vessel ischemic disease and chronic infarcts, as described. 4. Mild left maxillary sinus disease. Electronically Signed   By: Jackey Loge D.O.   On: 02/04/2024 13:55     PHYSICAL EXAM  Temp:  [97.7 F (36.5 C)-98.5 F (36.9 C)] (P) 97.7 F (36.5 C) (03/05 1603) Pulse Rate:  [65-84] (P) 84 (03/05 1603) Resp:  [17-18] (P) 17 (03/05 1603) BP: (132-165)/(67-75) (P) 162/76 (03/05 1603) SpO2:  [92 %-98 %] (P) 92 % (03/05 1603)  General - Well nourished, well developed, in no apparent distress.  Ophthalmologic - fundi not visualized due to noncooperation.  Cardiovascular - Regular rhythm and rate.  Neuro - awake, alert, eyes open, orientated to age, place, time and people. No aphasia, fluent language, mild dysarthria, following all simple commands. Able to name and repeat. No gaze palsy, tracking bilaterally, visual field full, PERRL.  Right mild facial droop. Tongue protrusion to the right.  Left upper and lower extremity 5/5, right upper extremity pronator drift and handgrip 4/5.  Right lower extremity proximal 5/5 and distal 4/5 sensation mildly decreased on the right, b/l FTN intact bilaterally, gait not tested.     ASSESSMENT/PLAN Mr. LEBRON NAUERT is a 63 y.o. male with history of hypertension, hyperlipidemia, diabetes, neuropathy, previous stroke, melanoma admitted for slurred speech, right facial droop and right sided weakness. No TNK given due to outside window.    Stroke:  left BG and CR infarct likely secondary to large vessel disease source CT no acute abnormality.  Chronic  encephalomalacia within the anterior lateral left frontal lobe underlying a cranioplasty  MRI 2 adjacent acute infarct within the left CR, left BG and PLIC CT head and neck left M2 severe stenosis, right P3 severe stenosis 2D Echo EF 60 to 65% LDL 41 HgbA1c 8.8 Lovenox for VTE prophylaxis aspirin 81 mg daily prior to admission, now on aspirin 81 mg daily and clopidogrel 75 mg daily DAPT for 3 months and then Plavix alone given severe left M2 stenosis. Patient counseled to be compliant with his antithrombotic medications Ongoing aggressive stroke risk factor management Therapy recommendations: CIR Disposition: Pending  History of stroke 01/2022 admitted for acute right thalamic and subacute left pontine infarct.  CTA head and neck unremarkable.  MRI with contrast no metastasis.  EF 60 to 65%.  LDL 47.  30-day CardioNet monitoring no A-fib.  Put on DAPT for 3 weeks and then aspirin alone.  Diabetes HgbA1c 8.8 goal < 7.0 Uncontrolled CBG monitoring SSI DM education and close PCP follow up  Hypertension Stable Avoid low BP Long term BP goal normotensive  Hyperlipidemia Home meds: Lipitor 20 LDL 41, goal < 70 Now on Lipitor 20 No high intensity statin due to LDL at goal Continue statin at discharge  Other Stroke Risk Factors   Other Active Problems Melanoma metastasis to brain status post craniotomy Melanoma at right thigh s/p resection  Hospital day # 0  Neurology will sign off. Please call with questions. Pt will follow up with [place] NP at [facility] in about 4 weeks. Thanks for the consult.   Marvel Plan, MD PhD Stroke Neurology 02/06/2024 8:06 PM    To contact Stroke Continuity provider, please refer to [email or web address]. After hours, contact General Neurology

## 2024-02-06 NOTE — Progress Notes (Signed)
 PROGRESS NOTE  Kenneth [name] NWG:[id number] DOB: 1961-05-13   PCP: Loyola Mast, MD  Patient is from: Home.  DOA: 02/04/2024 LOS: 0  Chief complaints Chief Complaint  Patient presents with   Code Stroke     Brief Narrative / Interim history: 63 year old M with PMH of DM-2,  prior CVA in 2021, metastatic melanoma to lung and brain in remission, EtOH pancreatitis, GIB, duodenitis and diverticulosis brought to ED on 3/3 with slurred speech, facial droop and right arm tingling and found to have acute CVA involving left corona radiata, left lentiform and posterior limb both internal capsule as noted on MRI brain.  CT angio head and neck with severe stenosis at the origin of M2 left MCA and P3 segment of right PCA.  TTE without significant finding.  A1c 8.8%.  LDL 41.  Loaded with Plavix 300 mg on 3/5.  Neurology recommends Plavix and aspirin for 3 months followed by Plavix alone.  Symptoms improving.  Therapy recommended CIR.  CIR following.  Subjective: Seen and examined earlier this morning.  No major events overnight of this morning.  No complaints.  Reports feeling better.  He says right-sided weakness and speech has improved.  Reports numbness in right fingers.  Has no complaints.  Patient sister at bedside.  Objective: Vitals:   02/06/24 0002 02/06/24 0355 02/06/24 0719 02/06/24 1102  BP: (!) 157/75 (!) 165/70 132/75 (!) 141/67  Pulse: 72 69 77 65  Resp: 18 17 17 18   Temp: 98.5 F (36.9 C) 98.2 F (36.8 C) 97.7 F (36.5 C) 98 F (36.7 C)  TempSrc: Oral Oral Oral Oral  SpO2: 97% 98% 94% 96%  Weight:      Height:        Examination:  GENERAL: No apparent distress.  Nontoxic. HEENT: MMM.  Vision and hearing grossly intact.  NECK: Supple.  No apparent JVD.  RESP:  No IWOB.  Fair aeration bilaterally. CVS:  RRR. Heart sounds normal.  ABD/GI/GU: BS+. Abd soft, NTND.  MSK/EXT:  Moves extremities. No apparent deformity. No edema.  SKIN: no apparent skin lesion or  wound NEURO: Awake, alert and oriented appropriately.  Noted dysarthria.  Right facial droop.  RUE 4/5.  RLE 4+/5.  Right pronator drift.  Light sensation intact in all dermatomes. PSYCH: Calm. Normal affect.   Procedures:  None  Microbiology summarized: None  Assessment and plan: Acute ischemic CVA left corona radiata, left lentiform and posterior limb internal capsule History of CVA -Presents with slurred speech, right facial droop, right arm weakness and tingling. -MRI showed left corona radiata, left lentiform and posterior limb both internal capsule as noted on MRI brain.   -CT angio head and neck with severe stenosis at the origin of M2 left MCA and P3 segment of right PCA.   -TTE without significant finding.  A1c 8.8%.  LDL 41.   -Loaded with Plavix 300 mg on 3/5.   -Neurology recommends Plavix and aspirin for 3 months followed by Plavix alone.  -SLP recommended dysphagia 3 diet -PT/OT recommend CIR.  CIR following.  NIDDM-2 with hyperglycemia: A1c 8.8%.  On glipizide, Actos, Januvia at home. Recent Labs  Lab 02/05/24 1652 02/05/24 2116 02/06/24 0629 02/06/24 0723 02/06/24 1105  GLUCAP 125* 165* 107* 96 146*  -Continue SSI-sensitive  Essential hypertension: BP within acceptable range. -Resume amlodipine instead of Lotrel -Hold metoprolol given bradycardia   Hypokalemia/hypomagnesemia -Monitor replenish as appropriate   Bradycardia: Was bradycardic to 40s and 50s.  Resolved. -Continue holding Toprol-XL  Metastatic melanoma brain resection 2016 -Outpatient follow-up with [facility] specialist   Prior GI bleed 2023/duodenitis/diverticulosis -Continue Protonix  BPH? -Continue Flomax   Body mass index is 28.32 kg/m.    DVT prophylaxis:  enoxaparin (LOVENOX) injection 40 mg Start: 02/05/24 1445 SCD's Start: 02/04/24 1506  Code Status: Full code Family Communication: Updated patient's sister at bedside Level of care: Telemetry Medical Status is:  Inpatient Remains inpatient appropriate because: CIR bed   Final disposition: CIR Consultants:  Neurology  55 minutes with more than 50% spent in reviewing records, counseling patient/family and coordinating care.   Sch Meds:  Scheduled Meds:  aspirin  81 mg Oral Daily   atorvastatin  20 mg Oral Daily   [START ON 02/07/2024] clopidogrel  75 mg Oral Daily   enoxaparin (LOVENOX) injection  40 mg Subcutaneous Q24H   insulin aspart  0-9 Units Subcutaneous TID WC   Continuous Infusions:  magnesium sulfate bolus IVPB 4 g (02/06/24 1313)   PRN Meds:.acetaminophen **OR** acetaminophen (TYLENOL) oral liquid 160 mg/5 mL **OR** acetaminophen, senna-docusate  Antimicrobials: Anti-infectives (From admission, onward)    None        I have personally reviewed the following labs and images: CBC: Recent Labs  Lab 02/04/24 1305 02/04/24 1311 02/05/24 0447  WBC 7.0  --  8.5  NEUTROABS 3.9  --   --   HGB 14.8 15.0 12.3*  HCT 43.1 44.0 36.4*  MCV 86.4  --  85.2  PLT 266  --  244   BMP &GFR Recent Labs  Lab 02/04/24 1305 02/04/24 1311 02/05/24 0447 02/06/24 0640  NA 139 141 137 138  K 3.6 3.6 3.2* 3.3*  CL 105 105 106 110  CO2 24  --  23 24  GLUCOSE 137* 134* 99 104*  BUN 15 17 19 11   CREATININE 0.94 1.00 1.07 1.03  CALCIUM 9.2  --  8.5* 8.3*  MG  --   --   --  1.5*   Estimated Creatinine Clearance: 68.8 mL/min (by C-G formula based on SCr of 1.03 mg/dL). Liver & Pancreas: Recent Labs  Lab 02/04/24 1305 02/05/24 0447  AST 19 19  ALT 22 21  ALKPHOS 49 43  BILITOT 0.3 0.6  PROT 7.2 5.9*  ALBUMIN 3.9 3.1*   No results for input(s): "LIPASE", "AMYLASE" in the last 168 hours. No results for input(s): "AMMONIA" in the last 168 hours. Diabetic: Recent Labs    02/05/24 0447  HGBA1C 8.8*   Recent Labs  Lab 02/05/24 1652 02/05/24 2116 02/06/24 0629 02/06/24 0723 02/06/24 1105  GLUCAP 125* 165* 107* 96 146*   Cardiac Enzymes: No results for input(s):  "CKTOTAL", "CKMB", "CKMBINDEX", "TROPONINI" in the last 168 hours. No results for input(s): "PROBNP" in the last 8760 hours. Coagulation Profile: Recent Labs  Lab 02/04/24 1305  INR 1.1   Thyroid Function Tests: No results for input(s): "TSH", "T4TOTAL", "FREET4", "T3FREE", "THYROIDAB" in the last 72 hours. Lipid Profile: Recent Labs    02/05/24 0447  CHOL 90  HDL 39*  LDLCALC 41  TRIG 48  CHOLHDL 2.3   Anemia Panel: No results for input(s): "VITAMINB12", "FOLATE", "FERRITIN", "TIBC", "IRON", "RETICCTPCT" in the last 72 hours. Urine analysis:    Component Value Date/Time   COLORURINE YELLOW 10/29/2023 1409   APPEARANCEUR CLEAR 10/29/2023 1409   LABSPEC 1.025 10/29/2023 1409   PHURINE 6.0 10/29/2023 1409   GLUCOSEU >=1000 (A) 10/29/2023 1409   HGBUR TRACE-INTACT (A) 10/29/2023 1409   BILIRUBINUR NEGATIVE 10/29/2023 1409  BILIRUBINUR n 03/20/2016 1017   KETONESUR NEGATIVE 10/29/2023 1409   PROTEINUR 100 (A) 11/03/2022 1528   UROBILINOGEN 0.2 10/29/2023 1409   NITRITE NEGATIVE 10/29/2023 1409   LEUKOCYTESUR NEGATIVE 10/29/2023 1409   Sepsis Labs: Invalid input(s): "PROCALCITONIN", "LACTICIDVEN"  Microbiology: No results found for this or any previous visit (from the past 240 hours).  Radiology Studies: DG Swallowing Func-Speech Pathology Result Date: 02/05/2024 Table formatting from the original result was not included. Modified Barium Swallow Study Patient Details Name: DELAND SLOCUMB MRN: [id number] Date of Birth: 1961-08-17 Today's Date: 02/05/2024 HPI/PMH: HPI: FENDER HERDER is a 63 y.o. male with medical history significant of hypertension, hyperlipidemia, gastritis, stroke, diabetes, neuropathy, metastatic melanoma in remission presenting with focal neurologic deficit characterized by right sided weakness and difficulty speaking. Two adjacent acute infarcts within the left corona radiata, left  lentiform nucleus and posterior limb of left internal capsule  (individually measuring up to 2 cm). Clinical Impression: Clinical Impression: Pt exhibits moderate oropharyngeal dysphagia suspected to be secondary to cranial nerve deficits. He ultimately has adequate oral control and clearance, but swallow initiation consistently requires a gross collection of each bolus at the level of the pyriform sinuses. Additionally, epiglottic inversion and BOT retraction are incomplete during the swallow which allows boluses to enter the laryngeal vestibule. Thin and nectar thick liquids are silently aspirated (PAS 8). Pt has a strong cough and when cued to do so, is able to clear a significant amount of aspirated material. A chin tuck posture was effective at preventing penetration/aspiration with nectar thick liquids but not with thin liquids. Recommend diet of Dys 3 solids with nectar thick liquids using a chin tuck posture for drinking. Pt may benefit from increased supervision initially to ensure use of compensatory strategies, but suspect he may progress to independence quickly. DIGEST Swallow Severity Rating*  Safety: 2  Efficiency: 1  Overall Pharyngeal Swallow Severity: 2 (moderate) 1: mild; 2: moderate; 3: severe; 4: profound *The Dynamic Imaging Grade of Swallowing Toxicity is standardized for the head and neck cancer population, however, demonstrates promising clinical applications across populations to standardize the clinical rating of pharyngeal swallow safety and severity. Factors that may increase risk of adverse event in presence of aspiration Rubye Oaks & Clearance Coots 2021): Factors that may increase risk of adverse event in presence of aspiration Rubye Oaks & Clearance Coots 2021): Reduced cognitive function Recommendations/Plan: Swallowing Evaluation Recommendations Swallowing Evaluation Recommendations Recommendations: PO diet PO Diet Recommendation: Dysphagia 3 (Mechanical soft); Moderately thick liquids (Level 3, honey thick) Liquid Administration via: Spoon; Cup Medication  Administration: Whole meds with puree Supervision: Patient able to self-feed; Intermittent supervision/cueing for swallowing strategies Swallowing strategies  : Minimize environmental distractions; Slow rate; Small bites/sips Postural changes: Position pt fully upright for meals Oral care recommendations: Oral care BID (2x/day) Caregiver Recommendations: Avoid jello, ice cream, thin soups, popsicles; Remove water pitcher Treatment Plan Treatment Plan Treatment recommendations: Therapy as outlined in treatment plan below Follow-up recommendations: Acute inpatient rehab (3 hours/day) Functional status assessment: Patient has had a recent decline in their functional status and demonstrates the ability to make significant improvements in function in a reasonable and predictable amount of time. Treatment frequency: Min 2x/week Treatment duration: 2 weeks Interventions: Aspiration precaution training; Compensatory techniques; Patient/family education; Trials of upgraded texture/liquids; Diet toleration management by SLP Recommendations Recommendations for follow up therapy are one component of a multi-disciplinary discharge planning process, led by the attending physician.  Recommendations may be updated based on patient status, additional functional criteria and insurance authorization.  Assessment: Orofacial Exam: Orofacial Exam Oral Cavity: Oral Hygiene: WFL Oral Cavity - Dentition: Adequate natural dentition Orofacial Anatomy: WFL Oral Motor/Sensory Function: Suspected cranial nerve impairment CN V - Trigeminal: Right motor impairment; Right sensory impairment CN VII - Facial: Right motor impairment CN IX - Glossopharyngeal, CN X - Vagus: WFL CN XII - Hypoglossal: Right motor impairment Anatomy: Anatomy: Suspected cervical osteophytes Boluses Administered: Boluses Administered Boluses Administered: Thin liquids (Level 0); Mildly thick liquids (Level 2, nectar thick); Moderately thick liquids (Level 3, honey thick);  Puree; Solid  Oral Impairment Domain: Oral Impairment Domain Lip Closure: No labial escape Tongue control during bolus hold: Posterior escape of greater than half of bolus Bolus preparation/mastication: Timely and efficient chewing and mashing Bolus transport/lingual motion: Brisk tongue motion Oral residue: Complete oral clearance Location of oral residue : N/A Initiation of pharyngeal swallow : Pyriform sinuses  Pharyngeal Impairment Domain: Pharyngeal Impairment Domain Soft palate elevation: No bolus between soft palate (SP)/pharyngeal wall (PW) Laryngeal elevation: Complete superior movement of thyroid cartilage with complete approximation of arytenoids to epiglottic petiole Anterior hyoid excursion: Complete anterior movement Epiglottic movement: No inversion Laryngeal vestibule closure: Incomplete, narrow column air/contrast in laryngeal vestibule Pharyngeal stripping wave : Present - complete Pharyngeal contraction (A/P view only): N/A Pharyngoesophageal segment opening: Complete distension and complete duration, no obstruction of flow Tongue base retraction: Narrow column of contrast or air between tongue base and PPW Pharyngeal residue: Trace residue within or on pharyngeal structures Location of pharyngeal residue: Tongue base; Valleculae; Pharyngeal wall  Esophageal Impairment Domain: No data recorded Pill: No data recorded Penetration/Aspiration Scale Score: Penetration/Aspiration Scale Score 1.  Material does not enter airway: Puree; Solid 2.  Material enters airway, remains ABOVE vocal cords then ejected out: Moderately thick liquids (Level 3, honey thick) 8.  Material enters airway, passes BELOW cords without attempt by patient to eject out (silent aspiration) : Thin liquids (Level 0); Mildly thick liquids (Level 2, nectar thick) Compensatory Strategies: Compensatory Strategies Compensatory strategies: Yes Straw: Ineffective Ineffective Straw: Thin liquid (Level 0); Mildly thick liquid (Level 2,  nectar thick) Chin tuck: Effective; Ineffective Effective Chin Tuck: Mildly thick liquid (Level 2, nectar thick) Ineffective Chin Tuck: Thin liquid (Level 0)   General Information: Caregiver present: No  Diet Prior to this Study: NPO   Temperature : Normal   Respiratory Status: WFL   Supplemental O2: None (Room air)   History of Recent Intubation: No  Behavior/Cognition: Alert; Cooperative; Pleasant mood Self-Feeding Abilities: Able to self-feed Baseline vocal quality/speech: Normal Volitional Cough: Able to elicit Volitional Swallow: Able to elicit Exam Limitations: No limitations Goal Planning: Prognosis for improved oropharyngeal function: Good Barriers to Reach Goals: Cognitive deficits No data recorded Patient/Family Stated Goal: none stated Consulted and agree with results and recommendations: Patient Pain: Pain Assessment Pain Assessment: No/denies pain End of Session: Start Time:SLP Start Time (ACUTE ONLY): 1453 Stop Time: SLP Stop Time (ACUTE ONLY): 1513 Time Calculation:SLP Time Calculation (min) (ACUTE ONLY): 20 min Charges: SLP Evaluations $ SLP Speech Visit: 1 Visit SLP Evaluations $BSS Swallow: 1 Procedure $MBS Swallow: 1 Procedure $ SLP EVAL LANGUAGE/SOUND PRODUCTION: 1 Procedure SLP visit diagnosis: SLP Visit Diagnosis: Dysphagia, oropharyngeal phase (R13.12) Past Medical History: Past Medical History: Diagnosis Date  Alcohol dependence (HCC)   Chronic calcific pancreatitis (HCC)   CVA (cerebral vascular accident) (HCC) 01/24/2022  Diabetes mellitus without complication (HCC)   Hyperlipidemia   Hypertension   Melanoma (HCC) 2013  metastatic - resected and cured with chemo/immuno tx  Pancreatic cyst-mass?  Polyneuropathy  Past Surgical History: Past Surgical History: Procedure Laterality Date  BIOPSY  02/03/2022  Procedure: BIOPSY;  Surgeon: Iva Boop, MD;  Location: [facility] ENDOSCOPY;  Service: Gastroenterology;;  BRAIN SURGERY  2015  to check for possible malignancy from melanoma, result were scar  tissue  COLONOSCOPY WITH PROPOFOL N/A 02/03/2022  Procedure: COLONOSCOPY WITH PROPOFOL;  Surgeon: Iva Boop, MD;  Location: [facility] ENDOSCOPY;  Service: Gastroenterology;  Laterality: N/A;  ESOPHAGOGASTRODUODENOSCOPY (EGD) WITH PROPOFOL N/A 02/03/2022  Procedure: ESOPHAGOGASTRODUODENOSCOPY (EGD) WITH PROPOFOL;  Surgeon: Iva Boop, MD;  Location: [facility] ENDOSCOPY;  Service: Gastroenterology;  Laterality: N/A;  melanoma exicison    right leg Gwynneth Aliment, M.A., CF-SLP Speech Language Pathology, Acute Rehabilitation Services Secure Chat preferred [phone] 02/05/2024, 4:43 PM     Shari Natt T. Leira Regino [facility]  If 7PM-7AM, please contact night-coverage [email or web address] 02/06/2024, 2:43 PM

## 2024-02-06 NOTE — Plan of Care (Signed)
  Problem: Education: Goal: Knowledge of disease or condition will improve Outcome: Progressing   Problem: Ischemic Stroke/TIA Tissue Perfusion: Goal: Complications of ischemic stroke/TIA will be minimized Outcome: Progressing   Problem: Coping: Goal: Will verbalize positive feelings about self Outcome: Progressing Goal: Will identify appropriate support needs Outcome: Progressing   Problem: Health Behavior/Discharge Planning: Goal: Goals will be collaboratively established with patient/family Outcome: Progressing   Problem: Nutrition: Goal: Risk of aspiration will decrease Outcome: Progressing Goal: Dietary intake will improve Outcome: Progressing   Problem: Education: Goal: Ability to describe self-care measures that may prevent or decrease complications (Diabetes Survival Skills Education) will improve Outcome: Progressing   Problem: Metabolic: Goal: Ability to maintain appropriate glucose levels will improve Outcome: Progressing   Problem: Nutritional: Goal: Maintenance of adequate nutrition will improve Outcome: Progressing   Problem: Tissue Perfusion: Goal: Adequacy of tissue perfusion will improve Outcome: Progressing   Problem: Clinical Measurements: Goal: Respiratory complications will improve Outcome: Progressing Goal: Cardiovascular complication will be avoided Outcome: Progressing

## 2024-02-06 NOTE — Progress Notes (Signed)
 Inpatient Rehab Admissions Coordinator:   I met with pt. And sister to discuss potential CIR admit. They are interested and sisters can assist at d/c. I will open a case with insurance and pursue for admit.   Megan Salon, MS, CCC-SLP Rehab Admissions Coordinator  [phone] (celll) [phone] (office)

## 2024-02-06 NOTE — Progress Notes (Signed)
 Speech Language Pathology Treatment: Dysphagia;Cognitive-Linquistic  Patient Details Name: Kenneth [name] MRN: [id number] DOB: 08-24-1961 Today's Date: 02/06/2024 Time: 1914-7829 SLP Time Calculation (min) (ACUTE ONLY): 11 min  Assessment / Plan / Recommendation Clinical Impression  Pt's sister states she has been present at mealtimes to provide intermittent supervision. He achieves complete oral clearance with regular texture solids. Pt independently initiated use of a chin tuck posture with nectar thick liquids. He required Min verbal cueing to maintain this posture when taking multiple consecutive sips. Reinforced education regarding rate control and MBS results. Introduced Systems developer strategies with pt immediately implementing them at the conversation level with increased effort due to general fatigue. Recommend upgrading diet to regular solids and continuing nectar thick liquids only with use of a chin tuck. Pt's sisters are trained to provide necessary intermittent supervision. SLP will continue following.    HPI HPI: Kenneth [name] is a 63 y.o. male with medical history significant of hypertension, hyperlipidemia, gastritis, stroke, diabetes, neuropathy, metastatic melanoma in remission presenting with focal neurologic deficit characterized by right sided weakness and difficulty speaking. Two adjacent acute infarcts within the left corona radiata, left  lentiform nucleus and posterior limb of left internal capsule (individually measuring up to 2 cm).      SLP Plan  Continue with current plan of care      Recommendations for follow up therapy are one component of a multi-disciplinary discharge planning process, led by the attending physician.  Recommendations may be updated based on patient status, additional functional criteria and insurance authorization.    Recommendations  Diet recommendations: Regular;Nectar-thick liquid Liquids provided via: Cup;Straw Medication  Administration: Whole meds with puree Supervision: Patient able to self feed;Intermittent supervision to cue for compensatory strategies;Trained caregiver to feed patient Compensations: Chin tuck;Slow rate;Small sips/bites Postural Changes and/or Swallow Maneuvers: Seated upright 90 degrees                  Oral care BID   Intermittent Supervision/Assistance Dysphagia, oropharyngeal phase (R13.12);Dysarthria and anarthria (R47.1)     Continue with current plan of care     Gwynneth Aliment, M.A., CF-SLP Speech Language Pathology, Acute Rehabilitation Services  Secure Chat preferred [phone]   02/06/2024, 2:49 PM

## 2024-02-06 NOTE — Progress Notes (Signed)
 PMR Admission Coordinator Pre-Admission Assessment  Patient: Kenneth [name] is an 63 y.o., male MRN: [id number] DOB: 1961/11/07 Height: 5\' 4"  (162.6 cm) Weight: 74.8 kg  Insurance Information HMO: yes    PPO:      PCP:      IPA:      80/20:      OTHER:  PRIMARY: [place] [employer]       Policy#: [id number]      Subscriber: Pt CM Name: Burgess Amor      Phone#: [phone]     Fax#: [phone] Pre-Cert#: [id number] approved on 02/07/24 by Cacilie for 6 days with initial review due on day 6      Employer:  Benefits:  Phone #:      Name: Eff Date: 12/05/2023 - 08/03/2024 Deductible: $0 (does not have deductible) OOP Max: $0 CIR: 100% coverage SNF: 100% coverage, first 90 days covered by [facility] Medicaid then members move to [facility] Outpatient: 100% coverage 27 visits per calendar year across all therapy disciplines combined Home Health:  100% coverage Home Health Aides services must be limited to 100 total visits per year per member DME: 100% coverage, limited by medical necessity Providers: in network   SECONDARY:       Policy#:      Phone#:   Artist:       Phone#:   The Data processing manager" for patients in Inpatient Rehabilitation Facilities with attached "Privacy Act Statement-Health Care Records" was provided and verbally reviewed with: n/a   Emergency Contact Information Contact Information     Name Relation Home Work Mobile   Mullins,Florence Sister [phone]  [phone]   Risi,Nina Sister [phone]  [phone]      Other Contacts   None on File     Current Medical History  Patient Admitting Diagnosis: L CVA  History of Present Illness: Kenneth LARICCIA is a 63 y.o. male with medical history significant of hypertension, hyperlipidemia, gastritis, stroke, diabetes, neuropathy, metastatic melanoma in remission  who presented to [facility] on [facility]04[facility]  with focal neurologic deficit. Vital signs  in the ED notable for blood pressure 130s to 150s systolic. Lab workup included CMP with glucose 137, CBC within normal limits. PT, PTT, INR within normal limits. Ethanol level negative. Chest x-ray pending. CT head showed no acute abnormality, did show focus of chronic encephalomalacia/gliosis within the frontal lobe underlying a cranioplasty. CT head no acute process. CTA head and neck with no LVO. . MRI on 02/04/2024 revealed Two adjacent acute infarcts within the left corona radiata, left lentiform nucleus and posterior limb of left internal capsule. Loaded aspirin Plavix-continue Plavix 75 till 02/24/2024, lifelong aspirin, continue statin 20 mg Lipitor. Pt. Seen by PT/OT/SLP and they recommend CIR to assist return to PLOF.   Complete NIHSS TOTAL: 6  Patient's medical record from [facility]  has been reviewed by the rehabilitation admission coordinator and physician.  Past Medical History  Past Medical History:  Diagnosis Date   Alcohol dependence (HCC)    Chronic calcific pancreatitis (HCC)    CVA (cerebral vascular accident) (HCC) 01/24/2022   Diabetes mellitus without complication (HCC)    Hyperlipidemia    Hypertension    Melanoma (HCC) 2013   metastatic - resected and cured with chemo/immuno tx   Pancreatic cyst-mass?    Polyneuropathy     Has the patient had major surgery during 100 days prior to admission? No  Family History   family history includes  Cancer in his maternal grandmother; Heart disease in his father; Prostate cancer in his father; Stomach cancer in his paternal grandmother.  Current Medications  Current Facility-Administered Medications:    acetaminophen (TYLENOL) tablet 650 mg, 650 mg, Oral, Q4H PRN **OR** acetaminophen (TYLENOL) 160 MG/5ML solution 650 mg, 650 mg, Per Tube, Q4H PRN **OR** acetaminophen (TYLENOL) suppository 650 mg, 650 mg, Rectal, Q4H PRN, Synetta Fail, MD   [START ON 02/08/2024] amLODipine (NORVASC) tablet 10 mg, 10 mg,  Oral, Daily **AND** benazepril (LOTENSIN) tablet 20 mg, 20 mg, Oral, Daily, Alanda Slim, Taye T, MD   aspirin chewable tablet 81 mg, 81 mg, Oral, Daily, Gevena Mart A, NP, 81 mg at 02/07/24 0900   atorvastatin (LIPITOR) tablet 20 mg, 20 mg, Oral, Daily, Synetta Fail, MD, 20 mg at 02/07/24 0900   [COMPLETED] clopidogrel (PLAVIX) tablet 300 mg, 300 mg, Oral, Once, 300 mg at 02/06/24 0955 **FOLLOWED BY** clopidogrel (PLAVIX) tablet 75 mg, 75 mg, Oral, Daily, Gonfa, Taye T, MD, 75 mg at 02/07/24 0900   enoxaparin (LOVENOX) injection 40 mg, 40 mg, Subcutaneous, Q24H, Samtani, Jai-Gurmukh, MD, 40 mg at 02/06/24 1442   insulin aspart (novoLOG) injection 0-9 Units, 0-9 Units, Subcutaneous, TID WC, Synetta Fail, MD, 2 Units at 02/07/24 1243   mirtazapine (REMERON) tablet 7.5 mg, 7.5 mg, Oral, QHS, Gonfa, Taye T, MD, 7.5 mg at 02/06/24 2132   potassium chloride (KLOR-CON) packet 40 mEq, 40 mEq, Oral, Q3H, Paytes, Austin A, RPH, 40 mEq at 02/07/24 1141   senna-docusate (Senokot-S) tablet 1 tablet, 1 tablet, Oral, QHS PRN, Synetta Fail, MD   tamsulosin [facility]) capsule 0.4 mg, 0.4 mg, Oral, q1800, Alanda Slim, Taye T, MD, 0.4 mg at 02/06/24 1745  Patients Current Diet:  Diet Order             Diet Carb Modified Fluid consistency: Nectar Thick; Room service appropriate? Yes  Diet effective now                   Precautions / Restrictions Precautions Precautions: Fall Restrictions Weight Bearing Restrictions Per Provider Order: No   Has the patient had 2 or more falls or a fall with injury in the past year? Yes  Prior Activity Level Community (5-7x/wk): Pt was active in the community PTA  Prior Functional Level Self Care: Did the patient need help bathing, dressing, using the toilet or eating? Independent  Indoor Mobility: Did the patient need assistance with walking from room to room (with or without device)? Independent  Stairs: Did the patient need assistance with internal or  external stairs (with or without device)? Independent  Functional Cognition: Did the patient need help planning regular tasks such as shopping or remembering to take medications? Needed some help  Patient Information Are you of Hispanic, Latino/a,or Spanish origin?: A. No, not of Hispanic, Latino/a, or Spanish origin What is your race?: A. White Do you need or want an interpreter to communicate with a doctor or health care staff?: 2. No  Patient's Response To:  Health Literacy and Transportation Is the patient able to respond to health literacy and transportation needs?: Yes Health Literacy - How often do you need to have someone help you when you read instructions, pamphlets, or other written material from your doctor or pharmacy?: Never In the past 12 months, has lack of transportation kept you from medical appointments or from getting medications?: No In the past 12 months, has lack of transportation kept you from meetings, work, or from getting  things needed for daily living?: No  Home Assistive Devices / Equipment Home Equipment: Gilmer Mor - single point  Prior Device Use: Indicate devices/aids used by the patient prior to current illness, exacerbation or injury? None of the above  Current Functional Level Cognition  Overall Cognitive Status: Within Functional Limits for tasks assessed Orientation Level: Oriented X4    Extremity Assessment (includes Sensation/Coordination)  Upper Extremity Assessment: Right hand dominant RUE Deficits / Details: <90 degree shoulder flexion AROM. Impaired sensation, gross motor, and FM coordination. Decreased grip strength RUE Sensation: decreased light touch RUE Coordination: decreased fine motor, decreased gross motor LUE Deficits / Details: generalized weakness and residual deficits  from previous CVA LUE Coordination: decreased fine motor  Lower Extremity Assessment: RLE deficits/detail RLE Deficits / Details: Grossly 4-/5 on LLE    ADLs   Overall ADL's : Needs assistance/impaired Eating/Feeding: Set up, Sitting Grooming: Set up, Sitting Upper Body Bathing: Minimal assistance, Sitting Lower Body Bathing: Minimal assistance, Sit to/from stand Upper Body Dressing : Minimal assistance, Sitting Lower Body Dressing: Moderate assistance, Sitting/lateral leans, Sit to/from stand Toilet Transfer: Minimal assistance, Ambulation, Rolling walker (2 wheels), Regular Toilet Toileting- Clothing Manipulation and Hygiene: Minimal assistance, Sit to/from stand Functional mobility during ADLs: Minimal assistance, Rolling walker (2 wheels) General ADL Comments: Deficits in sensation and coordination of  RUE/RLE. <90 degree R shoulder flexion AROM and decreased grip strength of R hand. Pt reports difficulty maintaining grasp of RW with R hand. Pt with difficulty grasping and manipulating sock with R hand.    Mobility  Overal bed mobility: Needs Assistance Bed Mobility: Supine to Sit, Sit to Supine Supine to sit: Contact guard, HOB elevated, Used rails Sit to supine: Contact guard assist General bed mobility comments: CGA for safety. Verbal cues provided for sequencing.    Transfers  Overall transfer level: Needs assistance Equipment used: Straight cane Transfers: Sit to/from Stand Sit to Stand: Min assist General transfer comment: MIN A for power up to stand.    Ambulation / Gait / Stairs / Wheelchair Mobility  Ambulation/Gait Ambulation/Gait assistance: Editor, commissioning (Feet): 75 Feet Assistive device: Straight cane, Rolling walker (2 wheels) Gait Pattern/deviations: Trunk flexed, Wide base of support, Decreased stride length, Step-through pattern, Drifts right/left General Gait Details: initially trialed ambulation with SPC however pt was rather unsteady requiring Min A for balance. Once in hallway PT provided pt with RW which pt appeared much more steady with. Gait velocity: decreased    Posture / Balance Dynamic Sitting  Balance Sitting balance - Comments: Pt engaged in lower body dressing seated EOB Balance Overall balance assessment: Needs assistance Sitting-balance support: Single extremity supported, Feet supported Sitting balance-Leahy Scale: Fair Sitting balance - Comments: Pt engaged in lower body dressing seated EOB Standing balance support: Bilateral upper extremity supported, Reliant on assistive device for balance, During functional activity Standing balance-Leahy Scale: Poor Standing balance comment: Pt engaged in functional ambulation using RW. Requires use of AD for support.    Special needs/care consideration Diabetic Management: Novolog 0-9 units 3x daily with meals   Previous Home Environment (from acute therapy documentation) Living Arrangements: Other relatives (sister)  Lives With: Family Available Help at Discharge: Family Type of Home: House Home Layout: Two level Home Access: Stairs to enter Entrance Stairs-Rails: Can reach both Secretary/administrator of Steps: 3 Bathroom Shower/Tub: Health visitor: Standard Home Care Services: No  Discharge Living Setting Plans for Discharge Living Setting: Patient's home Type of Home at Discharge: House Discharge Home Layout: Two  level, Able to live on main level with bedroom/bathroom Alternate Level Stairs-Rails: Right, Can reach both, Left Alternate Level Stairs-Number of Steps: flight Discharge Home Access: Stairs to enter Entrance Stairs-Rails: Left, Right, Can reach both Entrance Stairs-Number of Steps: 3 Discharge Bathroom Shower/Tub: Tub/shower unit Discharge Bathroom Toilet: Standard Discharge Bathroom Accessibility: Yes How Accessible: Accessible via walker Does the patient have any problems obtaining your medications?: No  Social/Family/Support Systems Patient Roles: Other (Comment) Contact Information: [phone] Anticipated Caregiver: Pt. lives with sister [name], other sister to assist while [name]  works Nurse, adult Information: 24/7 Caregiver Availability: 24/7 Discharge Plan Discussed with Primary Caregiver: Yes Is Caregiver In Agreement with Plan?: Yes Does Caregiver/Family have Issues with Lodging/Transportation while Pt is in Rehab?: No  Goals Patient/Family Goal for Rehab: PT/OT/ SLP min A to supervision Expected length of stay: 7-10 days Pt/Family Agrees to Admission and willing to participate: Yes Program Orientation Provided & Reviewed with Pt/Caregiver Including Roles  & Responsibilities: Yes  Decrease burden of Care through IP rehab admission: Not anticipated  Possible need for SNF placement upon discharge: Not anticipated  Patient Condition: I have reviewed medical records from [facility], spoken with CM, and patient and family member. I met with patient at the bedside for inpatient rehabilitation assessment.  Patient will benefit from ongoing PT, OT, and SLP, can actively participate in 3 hours of therapy a day 5 days of the week, and can make measurable gains during the admission.  Patient will also benefit from the coordinated team approach during an Inpatient Acute Rehabilitation admission.  The patient will receive intensive therapy as well as Rehabilitation physician, nursing, social worker, and care management interventions.  Due to safety, skin/wound care, disease management, medication administration, pain management, and patient education the patient requires 24 hour a day rehabilitation nursing.  The patient is currently Min A with mobility and Min-Mod A with basic ADLs.  Discharge setting and therapy post discharge at home with home health is anticipated.  Patient has agreed to participate in the Acute Inpatient Rehabilitation Program and will admit today.  Preadmission Screen Completed By:  , 02/07/2024 1:04 PM ______________________________________________________________________   Discussed status with Dr. Natale Lay on  02/08/24 at 10:05 AM and received approval for admission today.  Admission Coordinator:  Trish Mage, RN, time 10:05 AM/Date 02/08/24    Assessment/Plan: Diagnosis: CVA Does the need for close, 24 hr/day Medical supervision in concert with the patient's rehab needs make it unreasonable for this patient to be served in a less intensive setting? Yes Co-Morbidities requiring supervision/potential complications: DM2, HTN, Hypokalemia/hypomagnesemia, bradycardia, hx of metastatic melanoma, GI bleed history, BPH Due to bladder management, bowel management, safety, skin/wound care, disease management, medication administration, pain management, and patient education, does the patient require 24 hr/day rehab nursing? Yes Does the patient require coordinated care of a physician, rehab nurse, PT, OT, and SLP to address physical and functional deficits in the context of the above medical diagnosis(es)? Yes Addressing deficits in the following areas: balance, endurance, locomotion, strength, transferring, bowel/bladder control, bathing, dressing, feeding, grooming, toileting, cognition, speech, language, and psychosocial support Can the patient actively participate in an intensive therapy program of at least 3 hrs of therapy 5 days a week? Yes The potential for patient to make measurable gains while on inpatient rehab is excellent Anticipated functional outcomes upon discharge from inpatient rehab: supervision and min assist PT, supervision and min assist OT, supervision and min assist SLP Estimated rehab length of stay to reach  the above functional goals is: 7-10 Anticipated discharge destination: Home 10. Overall Rehab/Functional Prognosis: excellent   MD Signature: Fanny Dance

## 2024-02-07 ENCOUNTER — Ambulatory Visit: Payer: Medicaid Other

## 2024-02-07 DIAGNOSIS — C7800 Secondary malignant neoplasm of unspecified lung: Secondary | ICD-10-CM | POA: Diagnosis not present

## 2024-02-07 DIAGNOSIS — R29818 Other symptoms and signs involving the nervous system: Secondary | ICD-10-CM | POA: Diagnosis not present

## 2024-02-07 DIAGNOSIS — C7931 Secondary malignant neoplasm of brain: Secondary | ICD-10-CM | POA: Diagnosis not present

## 2024-02-07 DIAGNOSIS — Z8673 Personal history of transient ischemic attack (TIA), and cerebral infarction without residual deficits: Secondary | ICD-10-CM | POA: Diagnosis not present

## 2024-02-07 LAB — RENAL FUNCTION PANEL
Albumin: 3.3 g/dL — ABNORMAL LOW (ref 3.5–5.0)
Anion gap: 4 — ABNORMAL LOW (ref 5–15)
BUN: 9 mg/dL (ref 8–23)
CO2: 23 mmol/L (ref 22–32)
Calcium: 8.1 mg/dL — ABNORMAL LOW (ref 8.9–10.3)
Chloride: 109 mmol/L (ref 98–111)
Creatinine, Ser: 1.01 mg/dL (ref 0.61–1.24)
GFR, Estimated: >60 mL/min (ref >60)
Glucose, Bld: 117 mg/dL — ABNORMAL HIGH (ref 70–99)
Phosphorus: 3.2 mg/dL (ref 2.5–4.6)
Potassium: 3.3 mmol/L — ABNORMAL LOW (ref 3.5–5.1)
Sodium: 136 mmol/L (ref 135–145)

## 2024-02-07 LAB — GLUCOSE, CAPILLARY
Glucose-Capillary: 134 mg/dL — ABNORMAL HIGH (ref 70–99)
Glucose-Capillary: 194 mg/dL — ABNORMAL HIGH (ref 70–99)
Glucose-Capillary: 189 mg/dL — ABNORMAL HIGH (ref 70–99)
Glucose-Capillary: 155 mg/dL — ABNORMAL HIGH (ref 70–99)

## 2024-02-07 LAB — CBC
HCT: 41 % (ref 39.0–52.0)
Hemoglobin: 13.8 g/dL (ref 13.0–17.0)
MCH: 28.7 pg (ref 26.0–34.0)
MCHC: 33.7 g/dL (ref 30.0–36.0)
MCV: 85.2 fL (ref 80.0–100.0)
Platelets: 262 10*3/uL (ref 150–400)
RBC: 4.81 MIL/uL (ref 4.22–5.81)
RDW: 13.8 % (ref 11.5–15.5)
WBC: 6.4 10*3/uL (ref 4.0–10.5)
nRBC: 0 % (ref 0.0–0.2)

## 2024-02-07 LAB — MAGNESIUM: Magnesium: 1.9 mg/dL (ref 1.7–2.4)

## 2024-02-07 MED ORDER — POTASSIUM CHLORIDE 20 MEQ PO PACK
40.0000 meq | PACK | ORAL | Status: AC
  Administered 2024-02-07 (×2): 40 meq via ORAL
  Filled 2024-02-07 (×2): qty 2

## 2024-02-07 MED ORDER — POTASSIUM CHLORIDE CRYS ER 20 MEQ PO TBCR: 40.0000 meq | EXTENDED_RELEASE_TABLET | ORAL | Status: DC

## 2024-02-07 MED ORDER — POTASSIUM CHLORIDE 20 MEQ PO PACK
40.0000 meq | PACK | Freq: Once | ORAL | Status: DC
Start: 2024-02-07 — End: 2024-02-07

## 2024-02-07 MED ORDER — BENAZEPRIL HCL 20 MG PO TABS
20.0000 mg | ORAL_TABLET | Freq: Every day | ORAL | Status: DC
  Administered 2024-02-07 – 2024-02-08 (×2): 20 mg via ORAL
  Filled 2024-02-07 (×3): qty 1

## 2024-02-07 MED ORDER — AMLODIPINE BESYLATE 5 MG PO TABS
10.0000 mg | ORAL_TABLET | Freq: Every day | ORAL | Status: DC
  Administered 2024-02-08: 10 mg via ORAL
  Filled 2024-02-07: qty 2

## 2024-02-07 NOTE — Plan of Care (Signed)
  Problem: Education: Goal: Knowledge of disease or condition will improve Outcome: Progressing   Problem: Coping: Goal: Will verbalize positive feelings about self Outcome: Progressing   Problem: Self-Care: Goal: Ability to participate in self-care as condition permits will improve Outcome: Progressing   Problem: Nutrition: Goal: Risk of aspiration will decrease Outcome: Progressing Goal: Dietary intake will improve Outcome: Progressing

## 2024-02-07 NOTE — Progress Notes (Signed)
 Physical Therapy Treatment Patient Details Name: Kenneth [name] MRN: [id number] DOB: [birth year] Today's Date: 02/07/2024   History of Present Illness Kenneth [name] is a 63 y.o. male who [facility] to [facility] ED with c/o slurred speech, facial droop, and R arm tingling. MRI on 02/04/2024 revealed Two adjacent acute infarcts within the left corona radiata, left  lentiform nucleus and posterior limb of left internal capsule. PMHx includes alcohol dependence, chronic calcific pancreatitis, CVA, DM, HLD, HTN, melanoma, polyneuropathy    PT Comments  Pt tolerated treatment well today. Pt was able to progress ambulation distance in hallway today with RW Min A however pt noted to drift/lean to the right. No change in DC/DME recs at this time. Pt anticipates DC to CIR tomorrow. PT will continue to follow.     If plan is discharge home, recommend the following: A little help with walking and/or transfers;A little help with bathing/dressing/bathroom;Assistance with cooking/housework;Direct supervision/assist for medications management;Assist for transportation;Help with stairs or ramp for entrance   Can travel by private vehicle        Equipment Recommendations  None recommended by PT    Recommendations for Other Services       Precautions / Restrictions Precautions Precautions: Fall Restrictions Weight Bearing Restrictions Per Provider Order: No     Mobility  Bed Mobility Overal bed mobility: Needs Assistance Bed Mobility: Supine to Sit, Sit to Supine     Supine to sit: Contact guard, HOB elevated, Used rails Sit to supine: Contact guard assist   General bed mobility comments: CGA for safety. Verbal cues provided for sequencing.    Transfers Overall transfer level: Needs assistance Equipment used: Rolling walker (2 wheels) Transfers: Sit to/from Stand Sit to Stand: Min assist           General transfer comment: MIN A for power up to stand. cues for hand placement.     Ambulation/Gait Ambulation/Gait assistance: Min assist Gait Distance (Feet): 75 Feet Assistive device: Rolling walker (2 wheels) Gait Pattern/deviations: Trunk flexed, Wide base of support, Decreased stride length, Step-through pattern, Drifts right/left Gait velocity: decreased     General Gait Details: Min A for obstacle navigation. Pt noted to drift/lean to the R   Stairs             Wheelchair Mobility     Tilt Bed    Modified Rankin (Stroke Patients Only)       Balance Overall balance assessment: Needs assistance Sitting-balance support: Single extremity supported, Feet supported Sitting balance-Leahy Scale: Good     Standing balance support: Bilateral upper extremity supported, Reliant on assistive device for balance, During functional activity Standing balance-Leahy Scale: Poor Standing balance comment: Pt engaged in functional ambulation using RW. Requires use of AD for support.                            Communication Communication Communication: Impaired Factors Affecting Communication: Reduced clarity of speech  Cognition Arousal: Alert Behavior During Therapy: WFL for tasks assessed/performed   PT - Cognitive impairments: No apparent impairments                         Following commands: Intact      Cueing Cueing Techniques: Verbal cues  Exercises      General Comments General comments (skin integrity, edema, etc.): VSS      Pertinent Vitals/Pain Pain Assessment Pain Assessment: No/denies pain    Home  Living                          Prior Function            PT Goals (current goals can now be found in the care plan section) Progress towards PT goals: Progressing toward goals    Frequency    Min 1X/week      PT Plan      Co-evaluation              AM-PAC PT "6 Clicks" Mobility   Outcome Measure  Help needed turning from your back to your side while in a flat bed without using  bedrails?: A Little Help needed moving from lying on your back to sitting on the side of a flat bed without using bedrails?: A Little Help needed moving to and from a bed to a chair (including a wheelchair)?: A Little Help needed standing up from a chair using your arms (e.g., wheelchair or bedside chair)?: A Little Help needed to walk in hospital room?: A Little Help needed climbing 3-5 steps with a railing? : Total 6 Click Score: 16    End of Session Equipment Utilized During Treatment: Gait belt Activity Tolerance: Patient tolerated treatment well Patient left: in bed;with call bell/phone within reach;with family/visitor present Nurse Communication: Mobility status PT Visit Diagnosis: Other abnormalities of gait and mobility (R26.89)     Time: 6045-4098 PT Time Calculation (min) (ACUTE ONLY): 9 min  Charges:    $Gait Training: 8-22 mins PT General Charges $$ ACUTE PT VISIT: 1 Visit                     Shela Nevin, PT, DPT Acute Rehab Services [phone]    Gladys Damme 02/07/2024, 2:46 PM

## 2024-02-07 NOTE — Plan of Care (Signed)
 Problem: Education: Goal: Knowledge of disease or condition will improve 02/07/2024 0324 by Pamala Duffel, RN Outcome: Progressing 02/07/2024 0324 by Pamala Duffel, RN Outcome: Progressing Goal: Knowledge of secondary prevention will improve (MUST DOCUMENT ALL) 02/07/2024 0324 by Pamala Duffel, RN Outcome: Progressing 02/07/2024 0324 by Pamala Duffel, RN Outcome: Progressing Goal: Knowledge of patient specific risk factors will improve (DELETE if not current risk factor) 02/07/2024 0324 by Pamala Duffel, RN Outcome: Progressing 02/07/2024 0324 by Pamala Duffel, RN Outcome: Progressing   Problem: Ischemic Stroke/TIA Tissue Perfusion: Goal: Complications of ischemic stroke/TIA will be minimized 02/07/2024 0324 by Pamala Duffel, RN Outcome: Progressing 02/07/2024 0324 by Pamala Duffel, RN Outcome: Progressing   Problem: Coping: Goal: Will verbalize positive feelings about self 02/07/2024 0324 by Pamala Duffel, RN Outcome: Progressing 02/07/2024 0324 by Pamala Duffel, RN Outcome: Progressing Goal: Will identify appropriate support needs 02/07/2024 0324 by Pamala Duffel, RN Outcome: Progressing 02/07/2024 0324 by Pamala Duffel, RN Outcome: Progressing   Problem: Health Behavior/Discharge Planning: Goal: Ability to manage health-related needs will improve 02/07/2024 0324 by Pamala Duffel, RN Outcome: Progressing 02/07/2024 0324 by Pamala Duffel, RN Outcome: Progressing Goal: Goals will be collaboratively established with patient/family 02/07/2024 0324 by Pamala Duffel, RN Outcome: Progressing 02/07/2024 0324 by Pamala Duffel, RN Outcome: Progressing   Problem: Self-Care: Goal: Ability to participate in self-care as condition permits will improve 02/07/2024 0324 by Pamala Duffel, RN Outcome: Progressing 02/07/2024 0324 by Pamala Duffel, RN Outcome: Progressing Goal: Verbalization  of feelings and concerns over difficulty with self-care will improve 02/07/2024 0324 by Pamala Duffel, RN Outcome: Progressing 02/07/2024 0324 by Pamala Duffel, RN Outcome: Progressing Goal: Ability to communicate needs accurately will improve 02/07/2024 0324 by Pamala Duffel, RN Outcome: Progressing 02/07/2024 0324 by Pamala Duffel, RN Outcome: Progressing   Problem: Nutrition: Goal: Risk of aspiration will decrease 02/07/2024 0324 by Pamala Duffel, RN Outcome: Progressing 02/07/2024 0324 by Pamala Duffel, RN Outcome: Progressing Goal: Dietary intake will improve 02/07/2024 0324 by Pamala Duffel, RN Outcome: Progressing 02/07/2024 0324 by Pamala Duffel, RN Outcome: Progressing   Problem: Education: Goal: Ability to describe self-care measures that may prevent or decrease complications (Diabetes Survival Skills Education) will improve 02/07/2024 0324 by Pamala Duffel, RN Outcome: Progressing 02/07/2024 0324 by Pamala Duffel, RN Outcome: Progressing Goal: Individualized Educational Video(s) 02/07/2024 0324 by Pamala Duffel, RN Outcome: Progressing 02/07/2024 0324 by Pamala Duffel, RN Outcome: Progressing   Problem: Coping: Goal: Ability to adjust to condition or change in health will improve 02/07/2024 0324 by Pamala Duffel, RN Outcome: Progressing 02/07/2024 0324 by Pamala Duffel, RN Outcome: Progressing   Problem: Fluid Volume: Goal: Ability to maintain a balanced intake and output will improve 02/07/2024 0324 by Pamala Duffel, RN Outcome: Progressing 02/07/2024 0324 by Pamala Duffel, RN Outcome: Progressing   Problem: Health Behavior/Discharge Planning: Goal: Ability to identify and utilize available resources and services will improve 02/07/2024 0324 by Pamala Duffel, RN Outcome: Progressing 02/07/2024 0324 by Pamala Duffel, RN Outcome: Progressing Goal: Ability to  manage health-related needs will improve 02/07/2024 0324 by Pamala Duffel, RN Outcome: Progressing 02/07/2024 0324 by Pamala Duffel, RN Outcome: Progressing   Problem: Metabolic: Goal: Ability to maintain appropriate glucose levels will improve 02/07/2024 0324 by Pamala Duffel, RN Outcome: Progressing 02/07/2024 0324 by Pamala Duffel, RN Outcome: Progressing  Problem: Nutritional: Goal: Maintenance of adequate nutrition will improve 02/07/2024 0324 by Pamala Duffel, RN Outcome: Progressing 02/07/2024 0324 by Pamala Duffel, RN Outcome: Progressing Goal: Progress toward achieving an optimal weight will improve 02/07/2024 0324 by Pamala Duffel, RN Outcome: Progressing 02/07/2024 0324 by Pamala Duffel, RN Outcome: Progressing   Problem: Skin Integrity: Goal: Risk for impaired skin integrity will decrease 02/07/2024 0324 by Pamala Duffel, RN Outcome: Progressing 02/07/2024 0324 by Pamala Duffel, RN Outcome: Progressing   Problem: Tissue Perfusion: Goal: Adequacy of tissue perfusion will improve 02/07/2024 0324 by Pamala Duffel, RN Outcome: Progressing 02/07/2024 0324 by Pamala Duffel, RN Outcome: Progressing   Problem: Education: Goal: Knowledge of General Education information will improve Description: Including pain rating scale, medication(s)/side effects and non-pharmacologic comfort measures 02/07/2024 0324 by Pamala Duffel, RN Outcome: Progressing 02/07/2024 0324 by Pamala Duffel, RN Outcome: Progressing   Problem: Health Behavior/Discharge Planning: Goal: Ability to manage health-related needs will improve 02/07/2024 0324 by Pamala Duffel, RN Outcome: Progressing 02/07/2024 0324 by Pamala Duffel, RN Outcome: Progressing   Problem: Clinical Measurements: Goal: Ability to maintain clinical measurements within normal limits will improve 02/07/2024 0324 by Pamala Duffel,  RN Outcome: Progressing 02/07/2024 0324 by Pamala Duffel, RN Outcome: Progressing Goal: Will remain free from infection 02/07/2024 0324 by Pamala Duffel, RN Outcome: Progressing 02/07/2024 0324 by Pamala Duffel, RN Outcome: Progressing Goal: Diagnostic test results will improve 02/07/2024 0324 by Pamala Duffel, RN Outcome: Progressing 02/07/2024 0324 by Pamala Duffel, RN Outcome: Progressing Goal: Respiratory complications will improve 02/07/2024 0324 by Pamala Duffel, RN Outcome: Progressing 02/07/2024 0324 by Pamala Duffel, RN Outcome: Progressing Goal: Cardiovascular complication will be avoided Outcome: Progressing   Problem: Activity: Goal: Risk for activity intolerance will decrease Outcome: Progressing   Problem: Nutrition: Goal: Adequate nutrition will be maintained Outcome: Progressing   Problem: Coping: Goal: Level of anxiety will decrease Outcome: Progressing   Problem: Elimination: Goal: Will not experience complications related to bowel motility Outcome: Progressing Goal: Will not experience complications related to urinary retention Outcome: Progressing   Problem: Pain Managment: Goal: General experience of comfort will improve and/or be controlled Outcome: Progressing   Problem: Safety: Goal: Ability to remain free from injury will improve Outcome: Progressing   Problem: Skin Integrity: Goal: Risk for impaired skin integrity will decrease Outcome: Progressing

## 2024-02-07 NOTE — Progress Notes (Signed)
 PROGRESS NOTE  Kenneth [name]:[id number] DOB: 1961/10/05   PCP: Loyola Mast, MD  Patient is from: Home.  DOA: 02/04/2024 LOS: 1  Chief complaints Chief Complaint  Patient presents with   Code Stroke     Brief Narrative / Interim history: 63 year old M with PMH of DM-2,  prior CVA in 2021, metastatic melanoma to lung and brain in remission, EtOH pancreatitis, GIB, duodenitis and diverticulosis brought to ED on 3/3 with slurred speech, facial droop and right arm tingling and found to have acute CVA involving left corona radiata, left lentiform and posterior limb both internal capsule as noted on MRI brain.  CT angio head and neck with severe stenosis at the origin of M2 left MCA and P3 segment of right PCA.  TTE without significant finding.  A1c 8.8%.  LDL 41.  Loaded with Plavix 300 mg on 3/5.  Neurology recommends Plavix and aspirin for 3 months followed by Plavix alone.  Symptoms improving.  Therapy recommended CIR.  CIR following.  Subjective: Seen and examined earlier this morning.  No major events overnight of this morning.  Reports improvement in weakness and speech.  No complaints.  Eating breakfast.  Objective: Vitals:   02/06/24 2040 02/06/24 2350 02/07/24 0402 02/07/24 0806  BP: 138/73 (!) 144/76 (!) 144/78 (!) 156/87  Pulse: 72 63 67 77  Resp: 19 18 18 17   Temp: (!) 97.5 F (36.4 C) 97.7 F (36.5 C) 97.8 F (36.6 C) (!) 97.5 F (36.4 C)  TempSrc: Oral Oral Oral Oral  SpO2: 96% 96% 96% 98%  Weight:      Height:        Examination:  GENERAL: No apparent distress.  Nontoxic. HEENT: MMM.  Vision and hearing grossly intact.  NECK: Supple.  No apparent JVD.  RESP:  No IWOB.  Fair aeration bilaterally. CVS:  RRR. Heart sounds normal.  ABD/GI/GU: BS+. Abd soft, NTND.  MSK/EXT:  Moves extremities. No apparent deformity. No edema.  SKIN: no apparent skin lesion or wound NEURO: Awake, alert and oriented appropriately.  Has subtle dysarthria, right facial droop,  right pronator drift and right weakness.  Significantly improved.  PSYCH: Calm. Normal affect.   Procedures:  None  Microbiology summarized: None  Assessment and plan: Acute ischemic CVA left corona radiata, left lentiform and posterior limb internal capsule History of CVA -Presents with slurred speech, right facial droop, right arm weakness and tingling.  Neuroexams improved. -MRI showed left corona radiata, left lentiform and posterior limb both internal capsule as noted on MRI brain.   -CT angio head and neck with severe stenosis at the origin of M2 left MCA and P3 segment of right PCA.   -TTE without significant finding.  A1c 8.8%.  LDL 41.   -Loaded with Plavix 300 mg on 3/5.   -Neurology recommends Plavix and aspirin for 3 months followed by Plavix alone.  -Advanced to regular diet by SLP. -PT/OT recommend CIR.  CIR following.  NIDDM-2 with hyperglycemia: A1c 8.8%.  On glipizide, Actos, Januvia at home. Recent Labs  Lab 02/06/24 0723 02/06/24 1105 02/06/24 1559 02/06/24 2122 02/07/24 0807  GLUCAP 96 146* 226* 142* 134*  -Continue SSI-sensitive -Change diet to carb modified.  Essential hypertension: BP slightly elevated. -Continue home amlodipine -Resume home benazepril -Hold metoprolol given bradycardia   Hypokalemia/hypomagnesemia -Monitor replenish as appropriate   Sinus bradycardia: Was bradycardic to 40s and 50s.  Resolved. -Continue holding Toprol-XL  Metastatic melanoma brain resection 2016 -Outpatient follow-up with [facility] specialist  History of GI bleed 2023/duodenitis/diverticulosis -Continue Protonix  BPH? -Continue Flomax   Body mass index is 28.32 kg/m.    DVT prophylaxis:  enoxaparin (LOVENOX) injection 40 mg Start: 02/05/24 1445 SCD's Start: 02/04/24 1506  Code Status: Full code Family Communication: None at bedside. Level of care: Telemetry Medical Status is: Inpatient Remains inpatient appropriate because: CIR bed   Final  disposition: CIR. Medically stable for discharge. Consultants:  Neurology  55 minutes with more than 50% spent in reviewing records, counseling patient/family and coordinating care.   Sch Meds:  Scheduled Meds:  amLODipine  10 mg Oral Daily   aspirin  81 mg Oral Daily   atorvastatin  20 mg Oral Daily   clopidogrel  75 mg Oral Daily   enoxaparin (LOVENOX) injection  40 mg Subcutaneous Q24H   insulin aspart  0-9 Units Subcutaneous TID WC   mirtazapine  7.5 mg Oral QHS   potassium chloride  40 mEq Oral Q3H   tamsulosin  0.4 mg Oral q1800   Continuous Infusions:   PRN Meds:.acetaminophen **OR** acetaminophen (TYLENOL) oral liquid 160 mg/5 mL **OR** acetaminophen, senna-docusate  Antimicrobials: Anti-infectives (From admission, onward)    None        I have personally reviewed the following labs and images: CBC: Recent Labs  Lab 02/04/24 1305 02/04/24 1311 02/05/24 0447 02/07/24 0656  WBC 7.0  --  8.5 6.4  NEUTROABS 3.9  --   --   --   HGB 14.8 15.0 12.3* 13.8  HCT 43.1 44.0 36.4* 41.0  MCV 86.4  --  85.2 85.2  PLT 266  --  244 262   BMP &GFR Recent Labs  Lab 02/04/24 1305 02/04/24 1311 02/05/24 0447 02/06/24 0640 02/07/24 0656  NA 139 141 137 138 136  K 3.6 3.6 3.2* 3.3* 3.3*  CL 105 105 106 110 109  CO2 24  --  23 24 23   GLUCOSE 137* 134* 99 104* 117*  BUN 15 17 19 11 9   CREATININE 0.94 1.00 1.07 1.03 1.01  CALCIUM 9.2  --  8.5* 8.3* 8.1*  MG  --   --   --  1.5* 1.9  PHOS  --   --   --   --  3.2   Estimated Creatinine Clearance: 70.1 mL/min (by C-G formula based on SCr of 1.01 mg/dL). Liver & Pancreas: Recent Labs  Lab 02/04/24 1305 02/05/24 0447 02/07/24 0656  AST 19 19  --   ALT 22 21  --   ALKPHOS 49 43  --   BILITOT 0.3 0.6  --   PROT 7.2 5.9*  --   ALBUMIN 3.9 3.1* 3.3*   No results for input(s): "LIPASE", "AMYLASE" in the last 168 hours. No results for input(s): "AMMONIA" in the last 168 hours. Diabetic: Recent Labs     02/05/24 0447  HGBA1C 8.8*   Recent Labs  Lab 02/06/24 0723 02/06/24 1105 02/06/24 1559 02/06/24 2122 02/07/24 0807  GLUCAP 96 146* 226* 142* 134*   Cardiac Enzymes: No results for input(s): "CKTOTAL", "CKMB", "CKMBINDEX", "TROPONINI" in the last 168 hours. No results for input(s): "PROBNP" in the last 8760 hours. Coagulation Profile: Recent Labs  Lab 02/04/24 1305  INR 1.1   Thyroid Function Tests: No results for input(s): "TSH", "T4TOTAL", "FREET4", "T3FREE", "THYROIDAB" in the last 72 hours. Lipid Profile: Recent Labs    02/05/24 0447  CHOL 90  HDL 39*  LDLCALC 41  TRIG 48  CHOLHDL 2.3   Anemia Panel: No results for  input(s): "VITAMINB12", "FOLATE", "FERRITIN", "TIBC", "IRON", "RETICCTPCT" in the last 72 hours. Urine analysis:    Component Value Date/Time   COLORURINE YELLOW 10/29/2023 1409   APPEARANCEUR CLEAR 10/29/2023 1409   LABSPEC 1.025 10/29/2023 1409   PHURINE 6.0 10/29/2023 1409   GLUCOSEU >=1000 (A) 10/29/2023 1409   HGBUR TRACE-INTACT (A) 10/29/2023 1409   BILIRUBINUR NEGATIVE 10/29/2023 1409   BILIRUBINUR n 03/20/2016 1017   KETONESUR NEGATIVE 10/29/2023 1409   PROTEINUR 100 (A) 11/03/2022 1528   UROBILINOGEN 0.2 10/29/2023 1409   NITRITE NEGATIVE 10/29/2023 1409   LEUKOCYTESUR NEGATIVE 10/29/2023 1409   Sepsis Labs: Invalid input(s): "PROCALCITONIN", "LACTICIDVEN"  Microbiology: No results found for this or any previous visit (from the past 240 hours).  Radiology Studies: No results found.     Aubreana Cornacchia T. Maciej Schweitzer [facility]  If 7PM-7AM, please contact night-coverage [email or web address] 02/07/2024, 12:01 PM

## 2024-02-07 NOTE — Progress Notes (Signed)
 IP rehab admissions - We have approval for CIR admission.  Currently we do not have a bed available today, but hopeful for a CIR bed to become available tomorrow.  I updated patient and his wife at the bedside.  Patient looking forward to admitting to CIR.  I will have my partner follow up tomorrow.  [phone]

## 2024-02-07 NOTE — TOC Progression Note (Signed)
 Transition of Care [facility]) - Progression Note    Patient Details  Name: Kenneth [name] MRN: [id number] Date of Birth: 29-Jun-1961  [facility] CM/SW Contact  Kermit Balo, RN Phone Number: 02/07/2024, 3:05 PM  Clinical Narrative:     Pt will d/c to CIR when bed available.  TOC following.  Expected Discharge Plan: IP Rehab Facility Barriers to Discharge: Continued Medical Work up  Expected Discharge Plan and Services   Discharge Planning Services: CM Consult Post Acute Care Choice: IP Rehab Living arrangements for the past 2 months: Single Family Home                                       Social Determinants of Health (SDOH) Interventions SDOH Screenings   Food Insecurity: No Food Insecurity (02/04/2024)  Housing: Low Risk  (02/04/2024)  Transportation Needs: No Transportation Needs (02/04/2024)  Utilities: Not At Risk (02/04/2024)  Depression (PHQ2-9): Low Risk  (08/22/2023)  Financial Resource Strain: Low Risk  (01/06/2024)  Physical Activity: Unknown (01/06/2024)  Social Connections: Moderately Integrated (02/04/2024)  Tobacco Use: Medium Risk (01/31/2024)    Readmission Risk Interventions     No data to display

## 2024-02-08 ENCOUNTER — Telehealth: Payer: Self-pay | Admitting: Family Medicine

## 2024-02-08 ENCOUNTER — Encounter (HOSPITAL_COMMUNITY): Payer: Self-pay | Admitting: Physical Medicine and Rehabilitation

## 2024-02-08 ENCOUNTER — Other Ambulatory Visit: Payer: Self-pay

## 2024-02-08 ENCOUNTER — Inpatient Hospital Stay (HOSPITAL_COMMUNITY)
Admission: AD | Admit: 2024-02-08 | Discharge: 2024-02-16 | DRG: 057 | Disposition: A | Discharge status: Home / Self Care | Source: Intra-hospital | Attending: Physical Medicine and Rehabilitation | Admitting: Physical Medicine and Rehabilitation

## 2024-02-08 DIAGNOSIS — Z7902 Long term (current) use of antithrombotics/antiplatelets: Secondary | ICD-10-CM | POA: Diagnosis not present

## 2024-02-08 DIAGNOSIS — L219 Seborrheic dermatitis, unspecified: Secondary | ICD-10-CM

## 2024-02-08 DIAGNOSIS — Z9221 Personal history of antineoplastic chemotherapy: Secondary | ICD-10-CM | POA: Diagnosis not present

## 2024-02-08 DIAGNOSIS — D649 Anemia, unspecified: Secondary | ICD-10-CM | POA: Diagnosis present

## 2024-02-08 DIAGNOSIS — Z7982 Long term (current) use of aspirin: Secondary | ICD-10-CM | POA: Diagnosis not present

## 2024-02-08 DIAGNOSIS — E1142 Type 2 diabetes mellitus with diabetic polyneuropathy: Secondary | ICD-10-CM | POA: Diagnosis present

## 2024-02-08 DIAGNOSIS — Z8042 Family history of malignant neoplasm of prostate: Secondary | ICD-10-CM

## 2024-02-08 DIAGNOSIS — Z85820 Personal history of malignant melanoma of skin: Secondary | ICD-10-CM

## 2024-02-08 DIAGNOSIS — I63512 Cerebral infarction due to unspecified occlusion or stenosis of left middle cerebral artery: Secondary | ICD-10-CM | POA: Diagnosis not present

## 2024-02-08 DIAGNOSIS — N400 Enlarged prostate without lower urinary tract symptoms: Secondary | ICD-10-CM | POA: Diagnosis present

## 2024-02-08 DIAGNOSIS — R1312 Dysphagia, oropharyngeal phase: Secondary | ICD-10-CM | POA: Diagnosis present

## 2024-02-08 DIAGNOSIS — G6289 Other specified polyneuropathies: Secondary | ICD-10-CM | POA: Diagnosis not present

## 2024-02-08 DIAGNOSIS — I69351 Hemiplegia and hemiparesis following cerebral infarction affecting right dominant side: Principal | ICD-10-CM

## 2024-02-08 DIAGNOSIS — Z7984 Long term (current) use of oral hypoglycemic drugs: Secondary | ICD-10-CM

## 2024-02-08 DIAGNOSIS — C7802 Secondary malignant neoplasm of left lung: Secondary | ICD-10-CM | POA: Diagnosis present

## 2024-02-08 DIAGNOSIS — Z800 Family history of malignant neoplasm of digestive organs: Secondary | ICD-10-CM

## 2024-02-08 DIAGNOSIS — R21 Rash and other nonspecific skin eruption: Secondary | ICD-10-CM | POA: Diagnosis present

## 2024-02-08 DIAGNOSIS — Z8719 Personal history of other diseases of the digestive system: Secondary | ICD-10-CM | POA: Diagnosis not present

## 2024-02-08 DIAGNOSIS — Z794 Long term (current) use of insulin: Secondary | ICD-10-CM | POA: Diagnosis not present

## 2024-02-08 DIAGNOSIS — C7931 Secondary malignant neoplasm of brain: Secondary | ICD-10-CM | POA: Diagnosis present

## 2024-02-08 DIAGNOSIS — C7801 Secondary malignant neoplasm of right lung: Secondary | ICD-10-CM | POA: Diagnosis present

## 2024-02-08 DIAGNOSIS — Z8249 Family history of ischemic heart disease and other diseases of the circulatory system: Secondary | ICD-10-CM

## 2024-02-08 DIAGNOSIS — R29818 Other symptoms and signs involving the nervous system: Secondary | ICD-10-CM | POA: Diagnosis not present

## 2024-02-08 DIAGNOSIS — H8110 Benign paroxysmal vertigo, unspecified ear: Secondary | ICD-10-CM

## 2024-02-08 DIAGNOSIS — E785 Hyperlipidemia, unspecified: Secondary | ICD-10-CM | POA: Diagnosis present

## 2024-02-08 DIAGNOSIS — I639 Cerebral infarction, unspecified: Secondary | ICD-10-CM | POA: Diagnosis not present

## 2024-02-08 DIAGNOSIS — K2970 Gastritis, unspecified, without bleeding: Secondary | ICD-10-CM

## 2024-02-08 DIAGNOSIS — K2990 Gastroduodenitis, unspecified, without bleeding: Secondary | ICD-10-CM

## 2024-02-08 DIAGNOSIS — B351 Tinea unguium: Secondary | ICD-10-CM

## 2024-02-08 DIAGNOSIS — Z79899 Other long term (current) drug therapy: Secondary | ICD-10-CM | POA: Diagnosis not present

## 2024-02-08 DIAGNOSIS — Z7409 Other reduced mobility: Secondary | ICD-10-CM | POA: Diagnosis present

## 2024-02-08 DIAGNOSIS — I10 Essential (primary) hypertension: Secondary | ICD-10-CM | POA: Diagnosis present

## 2024-02-08 DIAGNOSIS — R1310 Dysphagia, unspecified: Secondary | ICD-10-CM | POA: Diagnosis not present

## 2024-02-08 DIAGNOSIS — E876 Hypokalemia: Secondary | ICD-10-CM | POA: Diagnosis present

## 2024-02-08 DIAGNOSIS — I69392 Facial weakness following cerebral infarction: Secondary | ICD-10-CM | POA: Diagnosis not present

## 2024-02-08 DIAGNOSIS — R001 Bradycardia, unspecified: Secondary | ICD-10-CM | POA: Diagnosis present

## 2024-02-08 DIAGNOSIS — R471 Dysarthria and anarthria: Secondary | ICD-10-CM | POA: Diagnosis present

## 2024-02-08 DIAGNOSIS — E119 Type 2 diabetes mellitus without complications: Secondary | ICD-10-CM | POA: Diagnosis not present

## 2024-02-08 DIAGNOSIS — G629 Polyneuropathy, unspecified: Secondary | ICD-10-CM

## 2024-02-08 LAB — BASIC METABOLIC PANEL
Anion gap: 7 (ref 5–15)
BUN: 10 mg/dL (ref 8–23)
CO2: 24 mmol/L (ref 22–32)
Calcium: 8.7 mg/dL — ABNORMAL LOW (ref 8.9–10.3)
Chloride: 107 mmol/L (ref 98–111)
Creatinine, Ser: 1.14 mg/dL (ref 0.61–1.24)
GFR, Estimated: >60 mL/min (ref >60)
Glucose, Bld: 180 mg/dL — ABNORMAL HIGH (ref 70–99)
Potassium: 3.8 mmol/L (ref 3.5–5.1)
Sodium: 138 mmol/L (ref 135–145)

## 2024-02-08 LAB — RENAL FUNCTION PANEL
Albumin: 3.2 g/dL — ABNORMAL LOW (ref 3.5–5.0)
Anion gap: 6 (ref 5–15)
BUN: 11 mg/dL (ref 8–23)
CO2: 24 mmol/L (ref 22–32)
Calcium: 8.5 mg/dL — ABNORMAL LOW (ref 8.9–10.3)
Chloride: 107 mmol/L (ref 98–111)
Creatinine, Ser: 1.15 mg/dL (ref 0.61–1.24)
GFR, Estimated: >60 mL/min (ref >60)
Glucose, Bld: 178 mg/dL — ABNORMAL HIGH (ref 70–99)
Phosphorus: 3.1 mg/dL (ref 2.5–4.6)
Potassium: 3.6 mmol/L (ref 3.5–5.1)
Sodium: 137 mmol/L (ref 135–145)

## 2024-02-08 LAB — GLUCOSE, CAPILLARY
Glucose-Capillary: 115 mg/dL — ABNORMAL HIGH (ref 70–99)
Glucose-Capillary: 177 mg/dL — ABNORMAL HIGH (ref 70–99)
Glucose-Capillary: 153 mg/dL — ABNORMAL HIGH (ref 70–99)
Glucose-Capillary: 105 mg/dL — ABNORMAL HIGH (ref 70–99)

## 2024-02-08 LAB — MAGNESIUM: Magnesium: 1.7 mg/dL (ref 1.7–2.4)

## 2024-02-08 MED ORDER — DIPHENHYDRAMINE HCL 25 MG PO CAPS: 25.0000 mg | ORAL_CAPSULE | Freq: Four times a day (QID) | ORAL | Status: DC | PRN

## 2024-02-08 MED ORDER — PROCHLORPERAZINE MALEATE 5 MG PO TABS: 5.0000 mg | ORAL_TABLET | Freq: Four times a day (QID) | ORAL | Status: DC | PRN

## 2024-02-08 MED ORDER — BISACODYL 10 MG RE SUPP: 10.0000 mg | Freq: Every day | RECTAL | Status: DC | PRN

## 2024-02-08 MED ORDER — ASPIRIN 81 MG PO TBEC: 81.0000 mg | DELAYED_RELEASE_TABLET | Freq: Every day | ORAL | Status: DC

## 2024-02-08 MED ORDER — KETOCONAZOLE 2 % EX CREA
TOPICAL_CREAM | Freq: Two times a day (BID) | CUTANEOUS | Status: DC
  Filled 2024-02-08: qty 15

## 2024-02-08 MED ORDER — ASPIRIN 81 MG PO CHEW
81.0000 mg | CHEWABLE_TABLET | Freq: Every day | ORAL | Status: DC
  Administered 2024-02-09 – 2024-02-15 (×7): 81 mg via ORAL
  Filled 2024-02-08 (×7): qty 1

## 2024-02-08 MED ORDER — PANTOPRAZOLE SODIUM 20 MG PO TBEC
20.0000 mg | DELAYED_RELEASE_TABLET | Freq: Every day | ORAL | Status: DC
  Administered 2024-02-08 – 2024-02-15 (×8): 20 mg via ORAL
  Filled 2024-02-08 (×11): qty 1

## 2024-02-08 MED ORDER — PROCHLORPERAZINE 25 MG RE SUPP: 12.5000 mg | Freq: Four times a day (QID) | RECTAL | Status: DC | PRN

## 2024-02-08 MED ORDER — GUAIFENESIN-DM 100-10 MG/5ML PO SYRP: 5.0000 mL | ORAL_SOLUTION | Freq: Four times a day (QID) | ORAL | Status: DC | PRN

## 2024-02-08 MED ORDER — ACETAMINOPHEN 325 MG PO TABS: 325.0000 mg | ORAL_TABLET | ORAL | Status: DC | PRN

## 2024-02-08 MED ORDER — TRAZODONE HCL 50 MG PO TABS: 25.0000 mg | ORAL_TABLET | Freq: Every evening | ORAL | Status: DC | PRN

## 2024-02-08 MED ORDER — INSULIN ASPART 100 UNIT/ML IJ SOLN
0.0000 [IU] | Freq: Three times a day (TID) | INTRAMUSCULAR | Status: DC
  Administered 2024-02-08: 2 [IU] via SUBCUTANEOUS
  Administered 2024-02-09: 1 [IU] via SUBCUTANEOUS
  Administered 2024-02-09: 2 [IU] via SUBCUTANEOUS
  Administered 2024-02-10: 1 [IU] via SUBCUTANEOUS
  Administered 2024-02-10: 2 [IU] via SUBCUTANEOUS

## 2024-02-08 MED ORDER — TRIAMCINOLONE ACETONIDE 0.5 % EX CREA: TOPICAL_CREAM | Freq: Two times a day (BID) | CUTANEOUS | Status: DC | PRN

## 2024-02-08 MED ORDER — EMPAGLIFLOZIN 10 MG PO TABS: 10.0000 mg | ORAL_TABLET | Freq: Every day | ORAL | Status: DC

## 2024-02-08 MED ORDER — AMLODIPINE BESYLATE 10 MG PO TABS
10.0000 mg | ORAL_TABLET | Freq: Every day | ORAL | Status: DC
  Administered 2024-02-09 – 2024-02-15 (×7): 10 mg via ORAL
  Filled 2024-02-08 (×7): qty 1

## 2024-02-08 MED ORDER — PROCHLORPERAZINE EDISYLATE 10 MG/2ML IJ SOLN: 5.0000 mg | Freq: Four times a day (QID) | INTRAMUSCULAR | Status: DC | PRN

## 2024-02-08 MED ORDER — DICLOFENAC SODIUM 1 % EX GEL
2.0000 g | Freq: Four times a day (QID) | CUTANEOUS | Status: DC
  Administered 2024-02-08 – 2024-02-15 (×16): 2 g via TOPICAL
  Filled 2024-02-08: qty 100

## 2024-02-08 MED ORDER — KETOCONAZOLE 2 % EX CREA
TOPICAL_CREAM | Freq: Two times a day (BID) | CUTANEOUS | Status: DC
Start: 2024-02-08 — End: 2024-02-08

## 2024-02-08 MED ORDER — TAMSULOSIN HCL 0.4 MG PO CAPS
0.4000 mg | ORAL_CAPSULE | Freq: Every day | ORAL | Status: DC
  Administered 2024-02-08 – 2024-02-15 (×8): 0.4 mg via ORAL
  Filled 2024-02-08 (×8): qty 1

## 2024-02-08 MED ORDER — FLEET ENEMA RE ENEM: 1.0000 | ENEMA | Freq: Once | RECTAL | Status: DC | PRN

## 2024-02-08 MED ORDER — SENNOSIDES-DOCUSATE SODIUM 8.6-50 MG PO TABS: 1.0000 | ORAL_TABLET | Freq: Two times a day (BID) | ORAL | Status: DC | PRN

## 2024-02-08 MED ORDER — INSULIN ASPART 100 UNIT/ML IJ SOLN: 0.0000 [IU] | Freq: Every day | INTRAMUSCULAR | Status: DC

## 2024-02-08 MED ORDER — BENAZEPRIL HCL 20 MG PO TABS
20.0000 mg | ORAL_TABLET | Freq: Every day | ORAL | Status: DC
  Administered 2024-02-09 – 2024-02-15 (×7): 20 mg via ORAL
  Filled 2024-02-08 (×8): qty 1

## 2024-02-08 MED ORDER — MIRTAZAPINE 15 MG PO TABS
7.5000 mg | ORAL_TABLET | Freq: Every day | ORAL | Status: DC
  Administered 2024-02-08 – 2024-02-15 (×8): 7.5 mg via ORAL
  Filled 2024-02-08 (×8): qty 1

## 2024-02-08 MED ORDER — CLOPIDOGREL BISULFATE 75 MG PO TABS: 75.0000 mg | ORAL_TABLET | Freq: Every day | ORAL | 3 refills | Status: DC

## 2024-02-08 MED ORDER — ENOXAPARIN SODIUM 40 MG/0.4ML IJ SOSY
40.0000 mg | PREFILLED_SYRINGE | INTRAMUSCULAR | Status: DC
  Administered 2024-02-08 – 2024-02-12 (×5): 40 mg via SUBCUTANEOUS
  Filled 2024-02-08 (×5): qty 0.4

## 2024-02-08 MED ORDER — CLOPIDOGREL BISULFATE 75 MG PO TABS
75.0000 mg | ORAL_TABLET | Freq: Every day | ORAL | Status: DC
  Administered 2024-02-09 – 2024-02-15 (×7): 75 mg via ORAL
  Filled 2024-02-08 (×7): qty 1

## 2024-02-08 MED ORDER — ATORVASTATIN CALCIUM 10 MG PO TABS
20.0000 mg | ORAL_TABLET | Freq: Every day | ORAL | Status: DC
  Administered 2024-02-09 – 2024-02-15 (×7): 20 mg via ORAL
  Filled 2024-02-08 (×7): qty 2

## 2024-02-08 MED ORDER — ALUM & MAG HYDROXIDE-SIMETH 200-200-20 MG/5ML PO SUSP: 30.0000 mL | ORAL | Status: DC | PRN

## 2024-02-08 MED ORDER — CARVEDILOL 3.125 MG PO TABS: 3.1250 mg | ORAL_TABLET | Freq: Two times a day (BID) | ORAL | Status: DC

## 2024-02-08 NOTE — Plan of Care (Signed)

## 2024-02-08 NOTE — Progress Notes (Signed)
 Signed     Expand All Collapse All PMR Admission Coordinator Pre-Admission Assessment   Patient: Kenneth [name] is an 63 y.o., male MRN: [id number] DOB: [birth year] Height: 5\' 4"  (162.6 cm) Weight: 74.8 kg   Insurance Information HMO: yes    PPO:      PCP:      IPA:      80/20:      OTHER:  PRIMARY: [place] [employer]       Policy#: [id number]      Subscriber: Pt CM Name: Burgess Amor      Phone#: [phone]     Fax#: [phone] Pre-Cert#: [id number] approved on 02/07/24 by Cacilie for 6 days with initial review due on day 6      Employer:  Benefits:  Phone #:      Name: Eff Date: 12/05/2023 - 08/03/2024 Deductible: $0 (does not have deductible) OOP Max: $0 CIR: 100% coverage SNF: 100% coverage, first 90 days covered by [facility] Medicaid then members move to [facility] Outpatient: 100% coverage 27 visits per calendar year across all therapy disciplines combined Home Health:  100% coverage Home Health Aides services must be limited to 100 total visits per year per member DME: 100% coverage, limited by medical necessity Providers: in network   SECONDARY:       Policy#:      Phone#:    Artist:       Phone#:    The Data processing manager" for patients in Inpatient Rehabilitation Facilities with attached "Privacy Act Statement-Health Care Records" was provided and verbally reviewed with: n/a    Emergency Contact Information Contact Information       Name Relation Home Work Mobile    Mullins,Florence Sister [phone]   [phone]    Risi,Nina Sister [phone]   [phone]         Other Contacts   None on File        Current Medical History  Patient Admitting Diagnosis: L CVA   History of Present Illness: Kenneth [name] is a 63 y.o. male with medical history significant of hypertension, hyperlipidemia, gastritis, stroke, diabetes, neuropathy, metastatic melanoma in remission  who [facility] to [facility] on 02/04/24  with focal neurologic deficit. Vital signs in the ED notable for blood pressure 130s to 150s systolic. Lab workup included CMP with glucose 137, CBC within normal limits. PT, PTT, INR within normal limits. Ethanol level negative. Chest x-ray pending. CT head showed no acute abnormality, did show focus of chronic encephalomalacia/gliosis within the frontal lobe underlying a cranioplasty. CT head no acute process. CTA head and neck with no LVO. . MRI on 02/04/2024 revealed Two adjacent acute infarcts within the left corona radiata, left lentiform nucleus and posterior limb of left internal capsule. Loaded aspirin Plavix-continue Plavix 75 till 02/24/2024, lifelong aspirin, continue statin 20 mg Lipitor. Pt. Seen by PT/OT/SLP and they recommend CIR to assist return to PLOF.    Complete NIHSS TOTAL: 6   Patient's medical record from [facility]  has been reviewed by the rehabilitation admission coordinator and physician.   Past Medical History      Past Medical History:  Diagnosis Date   Alcohol dependence (HCC)     Chronic calcific pancreatitis (HCC)     CVA (cerebral vascular accident) (HCC) 01/24/2022   Diabetes mellitus without complication (HCC)     Hyperlipidemia     Hypertension     Melanoma (HCC) 2013    metastatic -  resected and cured with chemo/immuno tx   Pancreatic cyst-mass?     Polyneuropathy            Has the patient had major surgery during 100 days prior to admission? No   Family History   family history includes Cancer in his maternal grandmother; Heart disease in his father; Prostate cancer in his father; Stomach cancer in his paternal grandmother.   Current Medications  Current Medications    Current Facility-Administered Medications:    acetaminophen (TYLENOL) tablet 650 mg, 650 mg, Oral, Q4H PRN **OR** acetaminophen (TYLENOL) 160 MG/5ML solution 650 mg, 650 mg, Per Tube, Q4H PRN **OR** acetaminophen (TYLENOL) suppository 650 mg,  650 mg, Rectal, Q4H PRN, Synetta Fail, MD   [START ON 02/08/2024] amLODipine (NORVASC) tablet 10 mg, 10 mg, Oral, Daily **AND** benazepril (LOTENSIN) tablet 20 mg, 20 mg, Oral, Daily, Alanda Slim, Taye T, MD   aspirin chewable tablet 81 mg, 81 mg, Oral, Daily, Gevena Mart A, NP, 81 mg at 02/07/24 0900   atorvastatin (LIPITOR) tablet 20 mg, 20 mg, Oral, Daily, Synetta Fail, MD, 20 mg at 02/07/24 0900   [COMPLETED] clopidogrel (PLAVIX) tablet 300 mg, 300 mg, Oral, Once, 300 mg at 02/06/24 0955 **FOLLOWED BY** clopidogrel (PLAVIX) tablet 75 mg, 75 mg, Oral, Daily, Gonfa, Taye T, MD, 75 mg at 02/07/24 0900   enoxaparin (LOVENOX) injection 40 mg, 40 mg, Subcutaneous, Q24H, Samtani, Jai-Gurmukh, MD, 40 mg at 02/06/24 1442   insulin aspart (novoLOG) injection 0-9 Units, 0-9 Units, Subcutaneous, TID WC, Synetta Fail, MD, 2 Units at 02/07/24 1243   mirtazapine (REMERON) tablet 7.5 mg, 7.5 mg, Oral, QHS, Gonfa, Taye T, MD, 7.5 mg at 02/06/24 2132   potassium chloride (KLOR-CON) packet 40 mEq, 40 mEq, Oral, Q3H, Paytes, Austin A, RPH, 40 mEq at 02/07/24 1141   senna-docusate (Senokot-S) tablet 1 tablet, 1 tablet, Oral, QHS PRN, Synetta Fail, MD   tamsulosin [facility]) capsule 0.4 mg, 0.4 mg, Oral, q1800, Alanda Slim, Taye T, MD, 0.4 mg at 02/06/24 1745     Patients Current Diet:  Diet Order                  Diet Carb Modified Fluid consistency: Nectar Thick; Room service appropriate? Yes  Diet effective now                         Precautions / Restrictions Precautions Precautions: Fall Restrictions Weight Bearing Restrictions Per Provider Order: No    Has the patient had 2 or more falls or a fall with injury in the past year? Yes   Prior Activity Level Community (5-7x/wk): Pt was active in the community PTA   Prior Functional Level Self Care: Did the patient need help bathing, dressing, using the toilet or eating? Independent   Indoor Mobility: Did the patient need  assistance with walking from room to room (with or without device)? Independent   Stairs: Did the patient need assistance with internal or external stairs (with or without device)? Independent   Functional Cognition: Did the patient need help planning regular tasks such as shopping or remembering to take medications? Needed some help   Patient Information Are you of Hispanic, Latino/a,or Spanish origin?: A. No, not of Hispanic, Latino/a, or Spanish origin What is your race?: A. White Do you need or want an interpreter to communicate with a doctor or health care staff?: 2. No   Patient's Response To:  Health Literacy and Transportation Is  the patient able to respond to health literacy and transportation needs?: Yes Health Literacy - How often do you need to have someone help you when you read instructions, pamphlets, or other written material from your doctor or pharmacy?: Never In the past 12 months, has lack of transportation kept you from medical appointments or from getting medications?: No In the past 12 months, has lack of transportation kept you from meetings, work, or from getting things needed for daily living?: No   Home Assistive Devices / Equipment Home Equipment: Gilmer Mor - single point   Prior Device Use: Indicate devices/aids used by the patient prior to current illness, exacerbation or injury? None of the above   Current Functional Level Cognition   Overall Cognitive Status: Within Functional Limits for tasks assessed Orientation Level: Oriented X4    Extremity Assessment (includes Sensation/Coordination)   Upper Extremity Assessment: Right hand dominant RUE Deficits / Details: <90 degree shoulder flexion AROM. Impaired sensation, gross motor, and FM coordination. Decreased grip strength RUE Sensation: decreased light touch RUE Coordination: decreased fine motor, decreased gross motor LUE Deficits / Details: generalized weakness and residual deficits  from previous  CVA LUE Coordination: decreased fine motor  Lower Extremity Assessment: RLE deficits/detail RLE Deficits / Details: Grossly 4-/5 on LLE     ADLs   Overall ADL's : Needs assistance/impaired Eating/Feeding: Set up, Sitting Grooming: Set up, Sitting Upper Body Bathing: Minimal assistance, Sitting Lower Body Bathing: Minimal assistance, Sit to/from stand Upper Body Dressing : Minimal assistance, Sitting Lower Body Dressing: Moderate assistance, Sitting/lateral leans, Sit to/from stand Toilet Transfer: Minimal assistance, Ambulation, Rolling walker (2 wheels), Regular Toilet Toileting- Clothing Manipulation and Hygiene: Minimal assistance, Sit to/from stand Functional mobility during ADLs: Minimal assistance, Rolling walker (2 wheels) General ADL Comments: Deficits in sensation and coordination of  RUE/RLE. <90 degree R shoulder flexion AROM and decreased grip strength of R hand. Pt reports difficulty maintaining grasp of RW with R hand. Pt with difficulty grasping and manipulating sock with R hand.     Mobility   Overal bed mobility: Needs Assistance Bed Mobility: Supine to Sit, Sit to Supine Supine to sit: Contact guard, HOB elevated, Used rails Sit to supine: Contact guard assist General bed mobility comments: CGA for safety. Verbal cues provided for sequencing.     Transfers   Overall transfer level: Needs assistance Equipment used: Straight cane Transfers: Sit to/from Stand Sit to Stand: Min assist General transfer comment: MIN A for power up to stand.     Ambulation / Gait / Stairs / Wheelchair Mobility   Ambulation/Gait Ambulation/Gait assistance: Editor, commissioning (Feet): 75 Feet Assistive device: Straight cane, Rolling walker (2 wheels) Gait Pattern/deviations: Trunk flexed, Wide base of support, Decreased stride length, Step-through pattern, Drifts right/left General Gait Details: initially trialed ambulation with SPC however pt was rather unsteady requiring Min A  for balance. Once in hallway PT provided pt with RW which pt appeared much more steady with. Gait velocity: decreased     Posture / Balance Dynamic Sitting Balance Sitting balance - Comments: Pt engaged in lower body dressing seated EOB Balance Overall balance assessment: Needs assistance Sitting-balance support: Single extremity supported, Feet supported Sitting balance-Leahy Scale: Fair Sitting balance - Comments: Pt engaged in lower body dressing seated EOB Standing balance support: Bilateral upper extremity supported, Reliant on assistive device for balance, During functional activity Standing balance-Leahy Scale: Poor Standing balance comment: Pt engaged in functional ambulation using RW. Requires use of AD for support.  Special needs/care consideration Diabetic Management: Novolog 0-9 units 3x daily with meals    Previous Home Environment (from acute therapy documentation) Living Arrangements: Other relatives (sister)  Lives With: Family Available Help at Discharge: Family Type of Home: House Home Layout: Two level Home Access: Stairs to enter Entrance Stairs-Rails: Can reach both Entrance Stairs-Number of Steps: 3 Bathroom Shower/Tub: Health visitor: Standard Home Care Services: No   Discharge Living Setting Plans for Discharge Living Setting: Patient's home Type of Home at Discharge: House Discharge Home Layout: Two level, Able to live on main level with bedroom/bathroom Alternate Level Stairs-Rails: Right, Can reach both, Left Alternate Level Stairs-Number of Steps: flight Discharge Home Access: Stairs to enter Entrance Stairs-Rails: Left, Right, Can reach both Entrance Stairs-Number of Steps: 3 Discharge Bathroom Shower/Tub: Tub/shower unit Discharge Bathroom Toilet: Standard Discharge Bathroom Accessibility: Yes How Accessible: Accessible via walker Does the patient have any problems obtaining your medications?: No   Social/Family/Support  Systems Patient Roles: Other (Comment) Contact Information: [phone] Anticipated Caregiver: Pt. lives with sister [name], other sister to assist while [name] works Nurse, adult Information: 24/7 Caregiver Availability: 24/7 Discharge Plan Discussed with Primary Caregiver: Yes Is Caregiver In Agreement with Plan?: Yes Does Caregiver/Family have Issues with Lodging/Transportation while Pt is in Rehab?: No   Goals Patient/Family Goal for Rehab: PT/OT/ SLP min A to supervision Expected length of stay: 7-10 days Pt/Family Agrees to Admission and willing to participate: Yes Program Orientation Provided & Reviewed with Pt/Caregiver Including Roles  & Responsibilities: Yes   Decrease burden of Care through IP rehab admission: Not anticipated   Possible need for SNF placement upon discharge: Not anticipated   Patient Condition: I have reviewed [facility], spoken with CM, and patient and family member. I met with patient at the bedside for inpatient rehabilitation assessment.  Patient will benefit from ongoing PT, OT, and SLP, can actively participate in 3 hours of therapy a day 5 days of the week, and can make measurable gains during the admission.  Patient will also benefit from the coordinated team approach during an Inpatient Acute Rehabilitation admission.  The patient will receive intensive therapy as well as Rehabilitation physician, nursing, social worker, and care management interventions.  Due to safety, skin/wound care, disease management, medication administration, pain management, and patient education the patient requires 24 hour a day rehabilitation nursing.  The patient is currently Min A with mobility and Min-Mod A with basic ADLs.  Discharge setting and therapy post discharge at home with home health is anticipated.  Patient has agreed to participate in the Acute Inpatient Rehabilitation Program and will admit today.    Preadmission Screen Completed By:  , 02/07/2024 1:04 PM ______________________________________________________________________   Discussed status with Dr. Natale Lay on 02/08/24 at 10:05 AM and received approval for admission today.   Admission Coordinator:  Trish Mage, RN, time 10:05 AM/Date 02/08/24     Assessment/Plan: Diagnosis: CVA Does the need for close, 24 hr/day Medical supervision in concert with the patient's rehab needs make it unreasonable for this patient to be served in a less intensive setting? Yes Co-Morbidities requiring supervision/potential complications: DM2, HTN, Hypokalemia/hypomagnesemia, bradycardia, hx of metastatic melanoma, GI bleed history, BPH Due to bladder management, bowel management, safety, skin/wound care, disease management, medication administration, pain management, and patient education, does the patient require 24 hr/day rehab nursing? Yes Does the patient require coordinated care of a physician, rehab nurse, PT, OT, and SLP to address physical and functional  deficits in the context of the above medical diagnosis(es)? Yes Addressing deficits in the following areas: balance, endurance, locomotion, strength, transferring, bowel/bladder control, bathing, dressing, feeding, grooming, toileting, cognition, speech, language, and psychosocial support Can the patient actively participate in an intensive therapy program of at least 3 hrs of therapy 5 days a week? Yes The potential for patient to make measurable gains while on inpatient rehab is excellent Anticipated functional outcomes upon discharge from inpatient rehab: supervision and min assist PT, supervision and min assist OT, supervision and min assist SLP Estimated rehab length of stay to reach the above functional goals is: 7-10 Anticipated discharge destination: Home 10. Overall Rehab/Functional Prognosis: excellent     MD Signature: Fanny Dance

## 2024-02-08 NOTE — Discharge Summary (Signed)
 Physician Discharge Summary  Kenneth [name] UEA:[id number] DOB: 1961/06/09 DOA: 02/04/2024  PCP: Loyola Mast, MD  Admit date: 02/04/2024 Discharge date: 02/08/24  Admitted From: Home Disposition: Acute inpatient rehab/CIR Recommendations for Outpatient Follow-up:  Outpatient follow-up with neurology as below Check blood pressure, glycemic control CBC and CMP in 1 week Please follow up on the following pending results: None   Discharge Condition: Stable CODE STATUS: Full code  Follow-up Information     Butch Penny, NP. Schedule an appointment as soon as possible for a visit in 1 month(s).   Specialty: Neurology Contact information: [address] [place] [address] [phone]                 Hospital course 63 year old M with PMH of DM-2, prior CVA in 2021, metastatic melanoma to lung and brain in remission, EtOH pancreatitis, GIB, duodenitis and diverticulosis brought to ED on 3/3 with slurred speech, facial droop and right arm tingling and found to have acute CVA involving left corona radiata, left lentiform and posterior limb both internal capsule as noted on MRI brain. CT angio head and neck with severe stenosis at the origin of M2 left MCA and P3 segment of right PCA. TTE without significant finding. A1c 8.8%. LDL 41. Loaded with Plavix 300 mg on 3/5. Neurology recommends Plavix and aspirin for 3 months followed by Plavix alone. Symptoms improving. Therapy recommended CIR.   See individual problem list below for more.   Problems addressed during this hospitalization Acute ischemic CVA left corona radiata, left lentiform and posterior limb internal capsule History of CVA -Presents with slurred speech, right facial droop, right arm weakness and tingling.  Neuroexams improved. -MRI showed left corona radiata, left lentiform and posterior limb both internal capsule as noted on MRI brain.   -CT angio head and neck with severe stenosis at the origin  of M2 left MCA and P3 segment of right PCA.   -TTE without significant finding.  A1c 8.8%.  LDL 41.   -Loaded with Plavix 300 mg on 3/5.   -Neurology recommends Plavix and aspirin for 3 months followed by Plavix alone.  -Advanced to regular diet by SLP. -PT/OT recommend CIR.  -Outpatient neurology follow-up as above   NIDDM-2 with hyperglycemia: A1c 8.8%.  On glipizide, Actos, Januvia at home.  Not compliant. -Continue home  Actos and Januvia -Discontinue glipizide and add Jardiance. -Monitor glycemic control -Carb modified diet   Essential hypertension: BP within acceptable range. -Continue home Lotrel -Discontinued metoprolol and added low-dose Coreg. -Can titrate Coreg if no significant bradycardia. -Also added low-dose Jardiance which could help   Hypokalemia/hypomagnesemia: Resolved -Continue home potassium   Sinus bradycardia: Was bradycardic to 40s and 50s.  Resolved. -Discontinued metoprolol and added low-dose Coreg.   Metastatic melanoma brain resection 2016 -Outpatient follow-up with [facility] specialist   History of GI bleed 2023/duodenitis/diverticulosis -Continue Protonix   BPH? -Continue Flomax            Time spent 35 minutes  Vital signs Vitals:   02/07/24 1242 02/07/24 1632 02/08/24 0823 02/08/24 1132  BP: 137/75 (!) 158/79 139/67 (!) 151/68  Pulse: 66 87 65 66  Temp: 98.2 F (36.8 C) 98.2 F (36.8 C) 98.5 F (36.9 C) 97.9 F (36.6 C)  Resp: 17 19 17 17   Height:      Weight:      SpO2: 99% 100% 98% 98%  TempSrc: Oral Oral Oral Oral     Discharge exam GENERAL: No apparent  distress.  Nontoxic. HEENT: MMM.  Vision and hearing grossly intact.  NECK: Supple.  No apparent JVD.  RESP:  No IWOB.  Fair aeration bilaterally. CVS:  RRR. Heart sounds normal.  ABD/GI/GU: BS+. Abd soft, NTND.  MSK/EXT:  Moves extremities. No apparent deformity. No edema.  SKIN: no apparent skin lesion or wound NEURO: Awake, alert and oriented appropriately.  Has  subtle dysarthria, right facial droop and right weakness.  Significant right pronator drift.  PSYCH: Calm. Normal affect.   Discharge Instructions Discharge Instructions     Ambulatory referral to Neurology   Complete by: As directed    Follow up with Megan at [facility] in 4-6 weeks.  Patient is Megan's patient. Thanks.   Diet - low sodium heart healthy   Complete by: As directed    Diet Carb Modified   Complete by: As directed    Increase activity slowly   Complete by: As directed       Allergies as of 02/08/2024       Reactions   Cat Dander Shortness Of Breath, Swelling   Swelling, watery eyes        Medication List     STOP taking these medications    glipiZIDE 5 MG 24 hr tablet Commonly known as: GLUCOTROL XL   metoprolol tartrate 100 MG tablet Commonly known as: LOPRESSOR       TAKE these medications    amLODipine-benazepril 10-20 MG capsule Commonly known as: LOTREL Take 1 capsule by mouth daily.   aspirin EC 81 MG tablet Take 1 tablet (81 mg total) by mouth daily. Swallow whole.   atorvastatin 20 MG tablet Commonly known as: LIPITOR TAKE 1 TABLET BY MOUTH DAILY   carvedilol 3.125 MG tablet Commonly known as: Coreg Take 1 tablet (3.125 mg total) by mouth 2 (two) times daily.   clopidogrel 75 MG tablet Commonly known as: PLAVIX Take 1 tablet (75 mg total) by mouth daily. Start taking on: February 09, 2024   empagliflozin 10 MG Tabs tablet Commonly known as: Jardiance Take 1 tablet (10 mg total) by mouth daily before breakfast.   lactobacillus acidophilus Tabs tablet Take 1 tablet by mouth daily. Align   mirtazapine 7.5 MG tablet Commonly known as: REMERON TAKE 1 TABLET BY MOUTH DAILY AT BEDTIME   multivitamin with minerals Tabs tablet Take 1 tablet by mouth daily.   pioglitazone 30 MG tablet Commonly known as: ACTOS TAKE 1 TABLET(30 MG) BY MOUTH DAILY What changed: See the new instructions.   potassium chloride SA 20 MEQ tablet Commonly  known as: KLOR-CON M Take 1 tablet (20 mEq total) by mouth 2 (two) times daily.   senna-docusate 8.6-50 MG tablet Commonly known as: Senokot-S Take 1 tablet by mouth 2 (two) times daily between meals as needed for mild constipation.   sitaGLIPtin 100 MG tablet Commonly known as: Januvia Take 1 tablet (100 mg total) by mouth daily. What changed: when to take this   tamsulosin 0.4 MG Caps capsule Commonly known as: FLOMAX TAKE 1 CAPSULE(0.4 MG) BY MOUTH DAILY        Consultations: Neurology  Procedures/Studies:   DG Swallowing Func-Speech Pathology Result Date: 02/05/2024 Table formatting from the original result was not included. Modified Barium Swallow Study Patient Details Name: JESIEL GARATE MRN: [id number] Date of Birth: May 09, 1961 Today's Date: 02/05/2024 HPI/PMH: HPI: TYROME DONATELLI is a 63 y.o. male with medical history significant of hypertension, hyperlipidemia, gastritis, stroke, diabetes, neuropathy, metastatic melanoma in remission presenting with focal neurologic  deficit characterized by right sided weakness and difficulty speaking. Two adjacent acute infarcts within the left corona radiata, left  lentiform nucleus and posterior limb of left internal capsule (individually measuring up to 2 cm). Clinical Impression: Clinical Impression: Pt exhibits moderate oropharyngeal dysphagia suspected to be secondary to cranial nerve deficits. He ultimately has adequate oral control and clearance, but swallow initiation consistently requires a gross collection of each bolus at the level of the pyriform sinuses. Additionally, epiglottic inversion and BOT retraction are incomplete during the swallow which allows boluses to enter the laryngeal vestibule. Thin and nectar thick liquids are silently aspirated (PAS 8). Pt has a strong cough and when cued to do so, is able to clear a significant amount of aspirated material. A chin tuck posture was effective at preventing penetration/aspiration  with nectar thick liquids but not with thin liquids. Recommend diet of Dys 3 solids with nectar thick liquids using a chin tuck posture for drinking. Pt may benefit from increased supervision initially to ensure use of compensatory strategies, but suspect he may progress to independence quickly. DIGEST Swallow Severity Rating*  Safety: 2  Efficiency: 1  Overall Pharyngeal Swallow Severity: 2 (moderate) 1: mild; 2: moderate; 3: severe; 4: profound *The Dynamic Imaging Grade of Swallowing Toxicity is standardized for the head and neck cancer population, however, demonstrates promising clinical applications across populations to standardize the clinical rating of pharyngeal swallow safety and severity. Factors that may increase risk of adverse event in presence of aspiration Rubye Oaks & Clearance Coots 2021): Factors that may increase risk of adverse event in presence of aspiration Rubye Oaks & Clearance Coots 2021): Reduced cognitive function Recommendations/Plan: Swallowing Evaluation Recommendations Swallowing Evaluation Recommendations Recommendations: PO diet PO Diet Recommendation: Dysphagia 3 (Mechanical soft); Moderately thick liquids (Level 3, honey thick) Liquid Administration via: Spoon; Cup Medication Administration: Whole meds with puree Supervision: Patient able to self-feed; Intermittent supervision/cueing for swallowing strategies Swallowing strategies  : Minimize environmental distractions; Slow rate; Small bites/sips Postural changes: Position pt fully upright for meals Oral care recommendations: Oral care BID (2x/day) Caregiver Recommendations: Avoid jello, ice cream, thin soups, popsicles; Remove water pitcher Treatment Plan Treatment Plan Treatment recommendations: Therapy as outlined in treatment plan below Follow-up recommendations: Acute inpatient rehab (3 hours/day) Functional status assessment: Patient has had a recent decline in their functional status and demonstrates the ability to make significant improvements  in function in a reasonable and predictable amount of time. Treatment frequency: Min 2x/week Treatment duration: 2 weeks Interventions: Aspiration precaution training; Compensatory techniques; Patient/family education; Trials of upgraded texture/liquids; Diet toleration management by SLP Recommendations Recommendations for follow up therapy are one component of a multi-disciplinary discharge planning process, led by the attending physician.  Recommendations may be updated based on patient status, additional functional criteria and insurance authorization. Assessment: Orofacial Exam: Orofacial Exam Oral Cavity: Oral Hygiene: WFL Oral Cavity - Dentition: Adequate natural dentition Orofacial Anatomy: WFL Oral Motor/Sensory Function: Suspected cranial nerve impairment CN V - Trigeminal: Right motor impairment; Right sensory impairment CN VII - Facial: Right motor impairment CN IX - Glossopharyngeal, CN X - Vagus: WFL CN XII - Hypoglossal: Right motor impairment Anatomy: Anatomy: Suspected cervical osteophytes Boluses Administered: Boluses Administered Boluses Administered: Thin liquids (Level 0); Mildly thick liquids (Level 2, nectar thick); Moderately thick liquids (Level 3, honey thick); Puree; Solid  Oral Impairment Domain: Oral Impairment Domain Lip Closure: No labial escape Tongue control during bolus hold: Posterior escape of greater than half of bolus Bolus preparation/mastication: Timely and efficient chewing and mashing Bolus transport/lingual  motion: Brisk tongue motion Oral residue: Complete oral clearance Location of oral residue : N/A Initiation of pharyngeal swallow : Pyriform sinuses  Pharyngeal Impairment Domain: Pharyngeal Impairment Domain Soft palate elevation: No bolus between soft palate (SP)/pharyngeal wall (PW) Laryngeal elevation: Complete superior movement of thyroid cartilage with complete approximation of arytenoids to epiglottic petiole Anterior hyoid excursion: Complete anterior movement  Epiglottic movement: No inversion Laryngeal vestibule closure: Incomplete, narrow column air/contrast in laryngeal vestibule Pharyngeal stripping wave : Present - complete Pharyngeal contraction (A/P view only): N/A Pharyngoesophageal segment opening: Complete distension and complete duration, no obstruction of flow Tongue base retraction: Narrow column of contrast or air between tongue base and PPW Pharyngeal residue: Trace residue within or on pharyngeal structures Location of pharyngeal residue: Tongue base; Valleculae; Pharyngeal wall  Esophageal Impairment Domain: No data recorded Pill: No data recorded Penetration/Aspiration Scale Score: Penetration/Aspiration Scale Score 1.  Material does not enter airway: Puree; Solid 2.  Material enters airway, remains ABOVE vocal cords then ejected out: Moderately thick liquids (Level 3, honey thick) 8.  Material enters airway, passes BELOW cords without attempt by patient to eject out (silent aspiration) : Thin liquids (Level 0); Mildly thick liquids (Level 2, nectar thick) Compensatory Strategies: Compensatory Strategies Compensatory strategies: Yes Straw: Ineffective Ineffective Straw: Thin liquid (Level 0); Mildly thick liquid (Level 2, nectar thick) Chin tuck: Effective; Ineffective Effective Chin Tuck: Mildly thick liquid (Level 2, nectar thick) Ineffective Chin Tuck: Thin liquid (Level 0)   General Information: Caregiver present: No  Diet Prior to this Study: NPO   Temperature : Normal   Respiratory Status: WFL   Supplemental O2: None (Room air)   History of Recent Intubation: No  Behavior/Cognition: Alert; Cooperative; Pleasant mood Self-Feeding Abilities: Able to self-feed Baseline vocal quality/speech: Normal Volitional Cough: Able to elicit Volitional Swallow: Able to elicit Exam Limitations: No limitations Goal Planning: Prognosis for improved oropharyngeal function: Good Barriers to Reach Goals: Cognitive deficits No data recorded Patient/Family Stated Goal:  none stated Consulted and agree with results and recommendations: Patient Pain: Pain Assessment Pain Assessment: No/denies pain End of Session: Start Time:SLP Start Time (ACUTE ONLY): 1453 Stop Time: SLP Stop Time (ACUTE ONLY): 1513 Time Calculation:SLP Time Calculation (min) (ACUTE ONLY): 20 min Charges: SLP Evaluations $ SLP Speech Visit: 1 Visit SLP Evaluations $BSS Swallow: 1 Procedure $MBS Swallow: 1 Procedure $ SLP EVAL LANGUAGE/SOUND PRODUCTION: 1 Procedure SLP visit diagnosis: SLP Visit Diagnosis: Dysphagia, oropharyngeal phase (R13.12) Past Medical History: Past Medical History: Diagnosis Date  Alcohol dependence (HCC)   Chronic calcific pancreatitis (HCC)   CVA (cerebral vascular accident) (HCC) 01/24/2022  Diabetes mellitus without complication (HCC)   Hyperlipidemia   Hypertension   Melanoma (HCC) 2013  metastatic - resected and cured with chemo/immuno tx  Pancreatic cyst-mass?   Polyneuropathy  Past Surgical History: Past Surgical History: Procedure Laterality Date  BIOPSY  02/03/2022  Procedure: BIOPSY;  Surgeon: Iva Boop, MD;  Location: [facility] ENDOSCOPY;  Service: Gastroenterology;;  BRAIN SURGERY  2015  to check for possible malignancy from melanoma, result were scar tissue  COLONOSCOPY WITH PROPOFOL N/A 02/03/2022  Procedure: COLONOSCOPY WITH PROPOFOL;  Surgeon: Iva Boop, MD;  Location: [facility] ENDOSCOPY;  Service: Gastroenterology;  Laterality: N/A;  ESOPHAGOGASTRODUODENOSCOPY (EGD) WITH PROPOFOL N/A 02/03/2022  Procedure: ESOPHAGOGASTRODUODENOSCOPY (EGD) WITH PROPOFOL;  Surgeon: Iva Boop, MD;  Location: [facility] ENDOSCOPY;  Service: Gastroenterology;  Laterality: N/A;  melanoma exicison    right leg Gwynneth Aliment, M.A., CF-SLP Speech Language Pathology, Acute Rehabilitation Services Secure Chat  preferred [phone] 02/05/2024, 4:43 PM  ECHOCARDIOGRAM COMPLETE Result Date: 02/05/2024    ECHOCARDIOGRAM REPORT   Patient Name:   ARSALAN BRISBIN Date of Exam: 02/05/2024 Medical Rec #:  [id number]         Height:       64.0 in Accession #:    [id number]       Weight:       165.0 lb Date of Birth:  March 29, 1961         BSA:          1.803 m Patient Age:    62 years         BP:           114/62 mmHg Patient Gender: M                HR:           64 bpm. Exam Location:  Inpatient Procedure: 2D Echo, Color Doppler and Cardiac Doppler (Both Spectral and Color            Flow Doppler were utilized during procedure). Indications:    Stroke I63.9  History:        Patient has prior history of Echocardiogram examinations, most                 recent 01/25/2022. Risk Factors:Diabetes and Hypertension.  Sonographer:    Webb Laws Referring Phys: [id number] ALEXANDER B MELVIN IMPRESSIONS  1. Left ventricular ejection fraction, by estimation, is 60 to 65%. The left ventricle has normal function. The left ventricle has no regional wall motion abnormalities. Left ventricular diastolic parameters are consistent with Grade I diastolic dysfunction (impaired relaxation).  2. Right ventricular systolic function is normal. The right ventricular size is normal. Tricuspid regurgitation signal is inadequate for assessing PA pressure.  3. The mitral valve is grossly normal. Trivial mitral valve regurgitation. No evidence of mitral stenosis.  4. The aortic valve is tricuspid. Aortic valve regurgitation is not visualized. No aortic stenosis is present.  5. The inferior vena cava is normal in size with greater than 50% respiratory variability, suggesting right atrial pressure of 3 mmHg. Conclusion(s)/Recommendation(s): No intracardiac source of embolism detected on this transthoracic study. Consider a transesophageal echocardiogram to exclude cardiac source of embolism if clinically indicated. FINDINGS  Left Ventricle: Left ventricular ejection fraction, by estimation, is 60 to 65%. The left ventricle has normal function. The left ventricle has no regional wall motion abnormalities. The left ventricular internal cavity size was normal in  size. There is  no left ventricular hypertrophy. Left ventricular diastolic parameters are consistent with Grade I diastolic dysfunction (impaired relaxation). Right Ventricle: The right ventricular size is normal. No increase in right ventricular wall thickness. Right ventricular systolic function is normal. Tricuspid regurgitation signal is inadequate for assessing PA pressure. Left Atrium: Left atrial size was normal in size. Right Atrium: Right atrial size was normal in size. Pericardium: Trivial pericardial effusion is present. Presence of epicardial fat layer. Mitral Valve: The mitral valve is grossly normal. Trivial mitral valve regurgitation. No evidence of mitral valve stenosis. Tricuspid Valve: The tricuspid valve is grossly normal. Tricuspid valve regurgitation is trivial. No evidence of tricuspid stenosis. Aortic Valve: The aortic valve is tricuspid. Aortic valve regurgitation is not visualized. No aortic stenosis is present. Pulmonic Valve: The pulmonic valve was grossly normal. Pulmonic valve regurgitation is not visualized. No evidence of pulmonic stenosis. Aorta: The aortic root and ascending aorta are structurally normal, with no evidence of dilitation.  Venous: The inferior vena cava is normal in size with greater than 50% respiratory variability, suggesting right atrial pressure of 3 mmHg. IAS/Shunts: The atrial septum is grossly normal.  LEFT VENTRICLE PLAX 2D LVIDd:         4.70 cm     Diastology LVIDs:         2.50 cm     LV e' medial:    6.64 cm/s LV PW:         1.20 cm     LV E/e' medial:  12.4 LV IVS:        1.10 cm     LV e' lateral:   6.64 cm/s LVOT diam:     2.20 cm     LV E/e' lateral: 12.4 LV SV:         84 LV SV Index:   47 LVOT Area:     3.80 cm  LV Volumes (MOD) LV vol d, MOD A2C: 67.8 ml LV vol d, MOD A4C: 76.3 ml LV vol s, MOD A2C: 23.3 ml LV vol s, MOD A4C: 25.8 ml LV SV MOD A2C:     44.5 ml LV SV MOD A4C:     76.3 ml LV SV MOD BP:      46.6 ml RIGHT VENTRICLE             IVC RV  Basal diam:  3.60 cm     IVC diam: 1.10 cm RV S prime:     14.80 cm/s TAPSE (M-mode): 2.8 cm LEFT ATRIUM             Index        RIGHT ATRIUM           Index LA diam:        3.30 cm 1.83 cm/m   RA Area:     12.60 cm LA Vol (A2C):   17.9 ml 9.93 ml/m   RA Volume:   25.60 ml  14.20 ml/m LA Vol (A4C):   25.9 ml 14.37 ml/m LA Biplane Vol: 22.9 ml 12.70 ml/m  AORTIC VALVE LVOT Vmax:   111.00 cm/s LVOT Vmean:  72.000 cm/s LVOT VTI:    0.222 m  AORTA Ao Root diam: 3.60 cm Ao Asc diam:  3.20 cm MITRAL VALVE MV Area (PHT): 3.30 cm    SHUNTS MV Decel Time: 230 msec    Systemic VTI:  0.22 m MV E velocity: 82.57 cm/s  Systemic Diam: 2.20 cm MV A velocity: 95.90 cm/s MV E/A ratio:  0.86 Lennie Odor MD Electronically signed by Lennie Odor MD Signature Date/Time: 02/05/2024/1:35:52 PM    Final    MR BRAIN WO CONTRAST Result Date: 02/04/2024 CLINICAL DATA:  Provided history: Neuro deficit, acute, stroke suspected. EXAM: MRI HEAD WITHOUT CONTRAST TECHNIQUE: Multiplanar, multiecho pulse sequences of the brain and surrounding structures were obtained without intravenous contrast. COMPARISON:  Non-contrast head CT and CT angiogram head/neck performed earlier today 02/04/2024. Brain MRI 11/04/2022. FINDINGS: Mild intermittent motion degradation. Brain: Mild-to-moderate generalized cerebral atrophy. Two adjacent acute infarcts within the left corona radiata, left lentiform nucleus and posterior limb of left internal capsule (individually measuring up to 2 cm). Moderate-sized focus of chronic encephalomalacia/gliosis again demonstrated within the anterolateral left frontal lobe (deep to a cranioplasty). Chronic lacunar infarcts within the bilateral cerebral hemispheric white matter, within/about the bilateral deep gray nuclei and within the left aspect of the pons. Background moderate multifocal T2 FLAIR hyperintense signal abnormality within the cerebral white matter and pons,  nonspecific but compatible with chronic small  vessel ischemic disease. Several chronic microhemorrhages within the supratentorial and infratentorial brain, in a distribution suggesting sequelae of chronic hypertensive microangiopathy. No evidence of an intracranial mass. No extra-axial fluid collection. No midline shift. Vascular: Maintained flow voids within the proximal large arterial vessels. Skull and upper cervical spine: Left pterional cranioplasty. No focal worrisome marrow lesion. Sinuses/Orbits: No mass or acute finding within the imaged orbits. Mild mucosal thickening or small mucous retention cyst within the left maxillary sinus inferiorly. IMPRESSION: 1. Two adjacent acute infarcts within the left corona radiata, left lentiform nucleus and posterior limb of left internal capsule (individually measuring up to 2 cm). 2. Moderate-sized focus of chronic cortical encephalomalacia/gliosis within the anterolateral left frontal lobe (deep to a cranioplasty). 3. Background parenchymal atrophy, chronic small vessel ischemic disease and chronic lacunar infarcts, as described. 4. Several chronic microhemorrhages in a distribution suggesting sequelae of hypertensive microangiopathy. 5. Mild inflammatory left maxillary sinus disease. Electronically Signed   By: Jackey Loge D.O.   On: 02/04/2024 18:20   DG Chest Portable 1 View Result Date: 02/04/2024 CLINICAL DATA:  CVA EXAM: PORTABLE CHEST 1 VIEW COMPARISON:  01/25/2022 FINDINGS: No consolidation, pneumothorax or effusion. No edema. Normal cardiopericardial silhouette. Overlapping cardiac leads. Calcified aorta. IMPRESSION: No acute cardiopulmonary disease. Electronically Signed   By: Karen Kays M.D.   On: 02/04/2024 16:31   CT ANGIO HEAD NECK W WO CM (CODE STROKE) Result Date: 02/04/2024 CLINICAL DATA:  Provided history: Stroke code. Slurred speech, facial droop, weakness. EXAM: CT ANGIOGRAPHY HEAD AND NECK WITH AND WITHOUT CONTRAST TECHNIQUE: Multidetector CT imaging of the head and neck was performed  using the standard protocol during bolus administration of intravenous contrast. Multiplanar CT image reconstructions and MIPs were obtained to evaluate the vascular anatomy. Carotid stenosis measurements (when applicable) are obtained utilizing NASCET criteria, using the distal internal carotid diameter as the denominator. RADIATION DOSE REDUCTION: This exam was performed according to the departmental dose-optimization program which includes automated exposure control, adjustment of the mA and/or kV according to patient size and/or use of iterative reconstruction technique. CONTRAST:  75mL OMNIPAQUE IOHEXOL 350 MG/ML SOLN COMPARISON:  Non-contrast head CT performed earlier today 02/04/2024. FINDINGS: CTA NECK FINDINGS Aortic arch: Aberrant right subclavian artery (anatomic variant). Atherosclerotic plaque within the visualized thoracic aorta and proximal major branch vessels of the neck. Streak/beam hardening artifact arising from a dense contrast bolus partially obscures the left subclavian artery. Within this limitation, there is no appreciable hemodynamically significant innominate or proximal subclavian artery stenosis. Right carotid system: CCA and ICA patent within the neck without measurable stenosis. Minimal atherosclerotic plaque within the mid CCA and carotid bulb. Left carotid system: CCA and ICA patent within the neck without measurable stenosis. Minimal atherosclerotic plaque about the carotid bifurcation. Vertebral arteries: The right vertebral artery is non dominant and developmentally diminutive, but patent throughout the neck the dominant left vertebral artery is patent within the neck. Nonstenotic atherosclerotic plaque within the V1 and V2 segments. Skeleton: Cervical spondylosis. Other neck: No neck mass or cervical lymphadenopathy. Upper chest: No consolidation within the imaged lung apices. Review of the MIP images confirms the above findings CTA HEAD FINDINGS Anterior circulation: The  intracranial internal carotid arteries are patent. Atherosclerotic plaque within both vessels with no more than mild stenosis. The M1 middle cerebral arteries are patent. Severe stenosis at the origin of a left M2 MCA vessel (series 13, image 40). No M2 proximal branch occlusion is identified. The anterior cerebral arteries  are patent. No intracranial aneurysm is identified. Posterior circulation: The right vertebral artery remains developmentally diminutive intracranially, but patent. Patent intracranial left vertebral artery without stenosis. The basilar artery is patent. The posterior cerebral arteries are patent. Severe stenosis within the right PCA P3 segment (series 11, image 23) (series 13, image 30). Posterior communicating arteries are present bilaterally. Venous sinuses: Within the limitations of contrast timing, no convincing thrombus. Anatomic variants: As described. Review of the MIP images confirms the above findings No emergent large vessel occlusion identified. This result, and CTA head impressions #3 and #4 communicated to Dr. Iver Nestle at 2:16 pmon 3/3/2025by text page via the [facility] messaging system. IMPRESSION: CTA neck: 1. The common carotid and internal carotid arteries are patent within the neck without stenosis. Mild atherosclerotic plaque bilaterally, as described. 2. The right vertebral artery is non-dominant and developmentally diminutive, but patent throughout the neck. 3. The dominant left vertebral artery is patent within the neck without stenosis. 4. Aortic Atherosclerosis (ICD10-I70.0). 5. Aberrant right subclavian artery (anatomic variant). CTA head: 1. No proximal intracranial large vessel occlusion is identified. 2. Intracranial atherosclerotic disease with multifocal stenoses, most notably as follows. 3. Severe stenosis at the origin of an M2 left middle cerebral artery vessel. 4. Severe stenosis within the right posterior cerebral artery P3 segment. Electronically Signed   By: Jackey Loge D.O.   On: 02/04/2024 14:17   CT HEAD CODE STROKE WO CONTRAST Result Date: 02/04/2024 CLINICAL DATA:  Code stroke. Provided history: Neuro deficit, acute, stroke suspected. Slurred speech. Facial droop. Weakness. EXAM: CT HEAD WITHOUT CONTRAST TECHNIQUE: Contiguous axial images were obtained from the base of the skull through the vertex without intravenous contrast. RADIATION DOSE REDUCTION: This exam was performed according to the departmental dose-optimization program which includes automated exposure control, adjustment of the mA and/or kV according to patient size and/or use of iterative reconstruction technique. COMPARISON:  Brain MRI 11/04/2022. FINDINGS: Brain: Generalized cerebral atrophy. Moderate-sized focus of chronic encephalomalacia/gliosis again demonstrated within the anterolateral left frontal lobe (underlying a cranioplasty). Chronic lacunar infarcts within/about the bilateral deep gray nuclei and within the left aspect of the pons. As compared to the prior brain MRI of 11/04/2022, no new acute lacunar infarct is identified. Background moderate patchy and ill-defined hypoattenuation within the cerebral white matter, nonspecific but compatible chronic small vessel disease. There is no acute intracranial hemorrhage. No acute demarcated cortical infarct. No extra-axial fluid collection. No evidence of an intracranial mass. No midline shift. Vascular: No hyperdense vessel.  Atherosclerotic calcifications. Skull: No acute calvarial fracture.  Left pterional cranioplasty. Sinuses/Orbits: No mass or acute finding within the imaged orbits. Mild mucosal thickening or small mucous retention cyst within the left maxillary sinus inferiorly. ASPECTS [facility] Stroke Program Early CT Score) - Ganglionic level infarction (caudate, lentiform nuclei, internal capsule, insula, M1-M3 cortex): 7 - Supraganglionic infarction (M4-M6 cortex): 3 Total score (0-10 with 10 being normal): 10 (when discounting chronic  infarcts). No evidence of an acute intracranial abnormality. These results were communicated to Bhagat at 1:55 pmon 3/3/2025by text page via the [facility] messaging system. IMPRESSION: 1. No evidence of an acute intracranial abnormality. 2. Moderate-sized focus of chronic encephalomalacia/gliosis within the anterolateral left frontal lobe (underlying a cranioplasty). 3. Background parenchymal atrophy, chronic small vessel ischemic disease and chronic infarcts, as described. 4. Mild left maxillary sinus disease. Electronically Signed   By: Jackey Loge D.O.   On: 02/04/2024 13:55       The results of significant diagnostics from this hospitalization (including  imaging, microbiology, ancillary and laboratory) are listed below for reference.     Microbiology: No results found for this or any previous visit (from the past 240 hours).   Labs:  CBC: Recent Labs  Lab 02/04/24 1305 02/04/24 1311 02/05/24 0447 02/07/24 0656  WBC 7.0  --  8.5 6.4  NEUTROABS 3.9  --   --   --   HGB 14.8 15.0 12.3* 13.8  HCT 43.1 44.0 36.4* 41.0  MCV 86.4  --  85.2 85.2  PLT 266  --  244 262   BMP &GFR Recent Labs  Lab 02/04/24 1305 02/04/24 1311 02/05/24 0447 02/06/24 0640 02/07/24 0656 02/08/24 0914  NA 139 141 137 138 136 137  K 3.6 3.6 3.2* 3.3* 3.3* 3.6  CL 105 105 106 110 109 107  CO2 24  --  23 24 23 24   GLUCOSE 137* 134* 99 104* 117* 178*  BUN 15 17 19 11 9 11   CREATININE 0.94 1.00 1.07 1.03 1.01 1.15  CALCIUM 9.2  --  8.5* 8.3* 8.1* 8.5*  MG  --   --   --  1.5* 1.9 1.7  PHOS  --   --   --   --  3.2 3.1   Estimated Creatinine Clearance: 61.6 mL/min (by C-G formula based on SCr of 1.15 mg/dL). Liver & Pancreas: Recent Labs  Lab 02/04/24 1305 02/05/24 0447 02/07/24 0656 02/08/24 0914  AST 19 19  --   --   ALT 22 21  --   --   ALKPHOS 49 43  --   --   BILITOT 0.3 0.6  --   --   PROT 7.2 5.9*  --   --   ALBUMIN 3.9 3.1* 3.3* 3.2*   No results for input(s): "LIPASE", "AMYLASE" in the  last 168 hours. No results for input(s): "AMMONIA" in the last 168 hours. Diabetic: No results for input(s): "HGBA1C" in the last 72 hours. Recent Labs  Lab 02/07/24 1233 02/07/24 1634 02/07/24 2118 02/08/24 0652 02/08/24 1133  GLUCAP 194* 189* 155* 115* 177*   Cardiac Enzymes: No results for input(s): "CKTOTAL", "CKMB", "CKMBINDEX", "TROPONINI" in the last 168 hours. No results for input(s): "PROBNP" in the last 8760 hours. Coagulation Profile: Recent Labs  Lab 02/04/24 1305  INR 1.1   Thyroid Function Tests: No results for input(s): "TSH", "T4TOTAL", "FREET4", "T3FREE", "THYROIDAB" in the last 72 hours. Lipid Profile: No results for input(s): "CHOL", "HDL", "LDLCALC", "TRIG", "CHOLHDL", "LDLDIRECT" in the last 72 hours. Anemia Panel: No results for input(s): "VITAMINB12", "FOLATE", "FERRITIN", "TIBC", "IRON", "RETICCTPCT" in the last 72 hours. Urine analysis:    Component Value Date/Time   COLORURINE YELLOW 10/29/2023 1409   APPEARANCEUR CLEAR 10/29/2023 1409   LABSPEC 1.025 10/29/2023 1409   PHURINE 6.0 10/29/2023 1409   GLUCOSEU >=1000 (A) 10/29/2023 1409   HGBUR TRACE-INTACT (A) 10/29/2023 1409   BILIRUBINUR NEGATIVE 10/29/2023 1409   BILIRUBINUR n 03/20/2016 1017   KETONESUR NEGATIVE 10/29/2023 1409   PROTEINUR 100 (A) 11/03/2022 1528   UROBILINOGEN 0.2 10/29/2023 1409   NITRITE NEGATIVE 10/29/2023 1409   LEUKOCYTESUR NEGATIVE 10/29/2023 1409   Sepsis Labs: Invalid input(s): "PROCALCITONIN", "LACTICIDVEN"   SIGNED:  Almon Hercules, MD  [facility] 02/08/2024, 12:51 PM

## 2024-02-08 NOTE — Progress Notes (Signed)
 Inpatient Rehab Admissions Coordinator:  There is a bed available for pt in CIR today. Dr. Alanda Slim aware and in agreement. Pt, pt's sister Cristy Friedlander, NSG and TOC made aware.    Wolfgang Phoenix, MS, CCC-SLP Admissions Coordinator [phone]

## 2024-02-08 NOTE — Telephone Encounter (Signed)
 01/29/2024 same day cancellation, no letter sent, pt was in hospital and moved to inpatient rehab today

## 2024-02-08 NOTE — Progress Notes (Signed)
 Occupational Therapy Treatment Patient Details Name: Kenneth [name] MRN: [id number] DOB: [birth year] Today's Date: 02/08/2024   History of present illness Kenneth [name] is a 63 y.o. male who presented to [facility] ED with c/o slurred speech, facial droop, and R arm tingling. MRI on 02/04/2024 revealed Two adjacent acute infarcts within the left corona radiata, left  lentiform nucleus and posterior limb of left internal capsule. PMHx includes alcohol dependence, chronic calcific pancreatitis, CVA, DM, HLD, HTN, melanoma, polyneuropathy   OT comments  Pt is making great progress towards their acute OT goals. He remains very motivated to participate and demonstrated improve ability to functionally use his L hemibody this date. Overall he was able to get dressed with up to min A for cues to use compensatory techniques and assist for LB clothing management. He mobilized throughout the room and bathroom with CGA and good safety awareness. Pt did need increased time and effort for transfers from lower surfaces and CGA for functional tasks in static standing. OT to continue to follow acutely to facilitate progress towards established goals. Pt will continue to benefit from intensive inpatient follow up therapy, >3 hours/day after discharge.        If plan is discharge home, recommend the following:  A little help with walking and/or transfers;A little help with bathing/dressing/bathroom;Assistance with cooking/housework;Assist for transportation;Help with stairs or ramp for entrance   Equipment Recommendations  Other (comment)    Recommendations for Other Services Rehab consult    Precautions / Restrictions Precautions Precautions: Fall Restrictions Weight Bearing Restrictions Per Provider Order: No       Mobility Bed Mobility Overal bed mobility: Needs Assistance             General bed mobility comments: OOB upon arrival    Transfers Overall transfer level: Needs assistance Equipment  used: Rolling walker (2 wheels) Transfers: Sit to/from Stand Sit to Stand: Contact guard assist           General transfer comment: increased time and effort to stand from lower surfaces     Balance Overall balance assessment: Needs assistance Sitting-balance support: Feet supported Sitting balance-Leahy Scale: Good     Standing balance support: Single extremity supported, During functional activity Standing balance-Leahy Scale: Fair Standing balance comment: statically                           ADL either performed or assessed with clinical judgement   ADL Overall ADL's : Needs assistance/impaired                 Upper Body Dressing : Supervision/safety;Sitting Upper Body Dressing Details (indicate cue type and reason): VC to don R arm first Lower Body Dressing: Minimal assistance;Sit to/from stand Lower Body Dressing Details (indicate cue type and reason): VC to don R leg first, minimal assist to pull clothing over R hip Toilet Transfer: Contact guard assist;Rolling walker (2 wheels);Regular Teacher, adult education Details (indicate cue type and reason): cues for RW mgmt, incr time to stan from lower toilet height Toileting- Clothing Manipulation and Hygiene: Contact guard assist;Sit to/from stand Toileting - Clothing Manipulation Details (indicate cue type and reason): rear peri care in standing     Functional mobility during ADLs: Contact guard assist;Rolling walker (2 wheels) General ADL Comments: improved use of RUE for all functional tasks, pt demonstrates good safety awareness    Extremity/Trunk Assessment Upper Extremity Assessment Upper Extremity Assessment: RUE deficits/detail;LUE deficits/detail RUE Deficits / Details: <90  degree shoulder flexion AROM. Impaired sensation, gross motor, and FM coordination. Decreased grip strength. Imrpoved functional use during ADLs this date RUE Sensation: decreased light touch RUE Coordination: decreased fine  motor;decreased gross motor LUE Deficits / Details: generalized weakness and residual deficits  from previous CVA LUE Coordination: decreased fine motor   Lower Extremity Assessment Lower Extremity Assessment: Defer to PT evaluation        Vision   Vision Assessment?: No apparent visual deficits   Perception Perception Perception: Not tested   Praxis Praxis Praxis: Not tested   Communication Communication Communication: Impaired Factors Affecting Communication: Reduced clarity of speech   Cognition Arousal: Alert Behavior During Therapy: WFL for tasks assessed/performed Cognition: No apparent impairments                               Following commands: Intact        Cueing   Cueing Techniques: Verbal cues        General Comments VSS, sister present    Pertinent Vitals/ Pain       Pain Assessment Pain Assessment: No/denies pain   Frequency  Min 1X/week        Progress Toward Goals  OT Goals(current goals can now be found in the care plan section)  Progress towards OT goals: Progressing toward goals  Acute Rehab OT Goals Patient Stated Goal: to go to rehab OT Goal Formulation: With patient Time For Goal Achievement: 02/19/24 Potential to Achieve Goals: Good ADL Goals Pt Will Perform Grooming: standing;with supervision Pt Will Perform Upper Body Dressing: sitting;with supervision Pt Will Perform Lower Body Dressing: sit to/from stand;with supervision Pt Will Transfer to Toilet: with supervision;ambulating;regular height toilet Pt Will Perform Toileting - Clothing Manipulation and hygiene: with supervision;sit to/from stand Pt/caregiver will Perform Home Exercise Program: Increased strength;Right Upper extremity;With theraputty;With written HEP provided;Independently   AM-PAC OT "6 Clicks" Daily Activity     Outcome Measure   Help from another person eating meals?: A Little Help from another person taking care of personal grooming?: A  Little Help from another person toileting, which includes using toliet, bedpan, or urinal?: A Little Help from another person bathing (including washing, rinsing, drying)?: A Little Help from another person to put on and taking off regular upper body clothing?: A Little Help from another person to put on and taking off regular lower body clothing?: A Little 6 Click Score: 18    End of Session Equipment Utilized During Treatment: Gait belt;Rolling walker (2 wheels)  OT Visit Diagnosis: Unsteadiness on feet (R26.81);Other abnormalities of gait and mobility (R26.89);Muscle weakness (generalized) (M62.81);Hemiplegia and hemiparesis Hemiplegia - Right/Left: Right Hemiplegia - dominant/non-dominant: Dominant Hemiplegia - caused by: Cerebral infarction   Activity Tolerance Patient tolerated treatment well   Patient Left in chair;with call bell/phone within reach;with family/visitor present   Nurse Communication Mobility status        Time: 1191-4782 OT Time Calculation (min): 17 min  Charges: OT General Charges $OT Visit: 1 Visit OT Treatments $Self Care/Home Management : 8-22 mins  Derenda Mis, OTR/L Acute Rehabilitation Services Office [phone] Secure Chat Communication Preferred   Donia Pounds 02/08/2024, 11:37 AM

## 2024-02-08 NOTE — TOC Transition Note (Signed)
 Transition of Care [facility]) - Discharge Note   Patient Details  Name: Kenneth [name] MRN: [id number] Date of Birth: [birth year]  [facility] CM/SW Contact:  Tom-Johnson, Hershal Coria, RN Phone Number: 02/08/2024, 11:29 AM   Clinical Narrative:     Patient is scheduled for discharge to CIR today.  Readmission Risk Assessment done. Patient will be transported via bed as In-hospital transfer to CIR.   No further TOC needs noted.         Final next level of care: IP Rehab Facility Barriers to Discharge: Barriers Resolved   Patient Goals and CMS Choice Patient states their goals for this hospitalization and ongoing recovery are:: To go to CIR and return home after rehab CMS Medicare.gov Compare Post Acute Care list provided to:: Patient Choice offered to / list presented to : Patient      Discharge Placement                Patient to be transferred to facility by: In-hospital transfer via bed      Discharge Plan and Services Additional resources added to the After Visit Summary for     Discharge Planning Services: CM Consult Post Acute Care Choice: IP Rehab          DME Arranged: N/A DME Agency: NA       HH Arranged: NA HH Agency: NA        Social Drivers of Health (SDOH) Interventions SDOH Screenings   Food Insecurity: No Food Insecurity (02/04/2024)  Housing: Low Risk  (02/04/2024)  Transportation Needs: No Transportation Needs (02/04/2024)  Utilities: Not At Risk (02/04/2024)  Depression (PHQ2-9): Low Risk  (08/22/2023)  Financial Resource Strain: Low Risk  (01/06/2024)  Physical Activity: Unknown (01/06/2024)  Social Connections: Moderately Integrated (02/04/2024)  Tobacco Use: Medium Risk (01/31/2024)     Readmission Risk Interventions    02/08/2024   11:27 AM  Readmission Risk Prevention Plan  Transportation Screening Complete  PCP or Specialist Appt within 5-7 Days Complete  Home Care Screening Complete  Medication Review (RN CM) Referral  to Pharmacy

## 2024-02-08 NOTE — H&P (Signed)
 Physical Medicine and Rehabilitation Admission H&P    Chief Complaint  Patient presents with   Stroke with functional deficits    HPI: Kenneth [name] is a right handed male with history of HTN, CVA with residual mild right sided weakness 2023, recent falls due to RLE weakness (referred to outpatient PT), [place], ETOH abuse with pancreatitis, T2DM, polyneuropathy,  melanoma with mets to brain and lungs [facility] 2003)- in remission, who was admitted on 02/04/24 with reports of right hand numbness with weakness as well as right facial weakness with slurred speech. CTA head/neck showed severe stenosis right PCA P3 segment and severe stenosis at origin of M2 L-MCA  He was found to have two acute infarcts in left corona radiata, left lentiform nucleus and posterior limb left internal capsule, moderate chronic cortical encephalomalacia/gliosis anterior left frontal lobe due to cranioplasty and severe chronic micro hemorrhages felt to be sequale of hypertensive microangiopathy. 2D echo showed EF 60-65% with no wall abnormality, trivial TVR and trivial pericardial effusion.  MBS done revealing moderate oropharyngeal dysphagia with silent aspiration of thins and nectars and chin tuck effective in preventing aspiration of nectars. Dr. Roda Shutters felt that stroke was due to large vessel disease and recommended DAPT X 3 months followed by Plavix alone.  He was started on lipitor 20mg .   Patient continues to be limited by right sided weakness with RUE ataxia, mild right facial droop with dysarthria as well as dysphagia requiring modified diet. He currently requires min assist with PT/OT. He was independent with cane PTA. CIR recommended due to functional decline.   Lives in 2 story townhome with sister, made BR in dining room. 3 STE   Review of Systems  Constitutional:  Negative for chills and fever.  HENT:  Negative for hearing loss and tinnitus.   Eyes:  Negative for blurred vision, double vision, photophobia  and pain.  Respiratory:  Positive for cough (since stroke-- alot). Negative for hemoptysis.   Cardiovascular:  Negative for chest pain and palpitations.  Gastrointestinal:  Negative for abdominal pain, constipation, diarrhea, heartburn and nausea.  Genitourinary:  Negative for dysuria and urgency.  Musculoskeletal:  Positive for back pain (right hip pain since surgery. chronic left knee pain) and myalgias.  Skin:  Positive for rash. Negative for itching.  Neurological:  Positive for dizziness (little with movement now), sensory change (righ hand 1-3 fingers), speech change and focal weakness. Negative for headaches.  Psychiatric/Behavioral:  Negative for depression.     Past Medical History:  Diagnosis Date   Alcohol dependence (HCC)    Chronic calcific pancreatitis (HCC)    CVA (cerebral vascular accident) (HCC) 01/24/2022   Diabetes mellitus without complication (HCC)    Hyperlipidemia    Hypertension    Melanoma (HCC) 2013   metastatic - resected and cured with chemo/immuno tx   Pancreatic cyst-mass?    Polyneuropathy     Past Surgical History:  Procedure Laterality Date   BIOPSY  02/03/2022   Procedure: BIOPSY;  Surgeon: Iva Boop, MD;  Location: [facility] ENDOSCOPY;  Service: Gastroenterology;;   BRAIN SURGERY  2015   to check for possible malignancy from melanoma, result were scar tissue   COLONOSCOPY WITH PROPOFOL N/A 02/03/2022   Procedure: COLONOSCOPY WITH PROPOFOL;  Surgeon: Iva Boop, MD;  Location: [facility] ENDOSCOPY;  Service: Gastroenterology;  Laterality: N/A;   ESOPHAGOGASTRODUODENOSCOPY (EGD) WITH PROPOFOL N/A 02/03/2022   Procedure: ESOPHAGOGASTRODUODENOSCOPY (EGD) WITH PROPOFOL;  Surgeon: Iva Boop, MD;  Location: [facility] ENDOSCOPY;  Service: Gastroenterology;  Laterality: N/A;   melanoma exicison     right leg    Family History  Problem Relation Age of Onset   Heart disease Father    Prostate cancer Father    Cancer Maternal Grandmother        Brain    Stomach cancer Paternal Grandmother    Colon cancer Neg Hx    Esophageal cancer Neg Hx    Rectal cancer Neg Hx     Social History:  Single. Disabled --used to work as a Scientist, physiological for [employer]. He reports that he has never smoked.   His smokeless tobacco use includes snuff--one can week.Marland Kitchen He reports that he does not currently use alcohol. He reports that he does not use drugs.   Allergies  Allergen Reactions   Cat Dander Shortness Of Breath and Swelling    Swelling, watery eyes    Medications Prior to Admission  Medication Sig Dispense Refill   amLODipine-benazepril (LOTREL) 10-20 MG capsule Take 1 capsule by mouth daily. 90 capsule 3   aspirin EC 81 MG tablet Take 1 tablet (81 mg total) by mouth daily. Swallow whole.     atorvastatin (LIPITOR) 20 MG tablet TAKE 1 TABLET BY MOUTH DAILY 90 tablet 3   carvedilol (COREG) 3.125 MG tablet Take 1 tablet (3.125 mg total) by mouth 2 (two) times daily.     [START ON 02/09/2024] clopidogrel (PLAVIX) 75 MG tablet Take 1 tablet (75 mg total) by mouth daily. 90 tablet 3   empagliflozin (JARDIANCE) 10 MG TABS tablet Take 1 tablet (10 mg total) by mouth daily before breakfast.     lactobacillus acidophilus (BACID) TABS tablet Take 1 tablet by mouth daily. Align     mirtazapine (REMERON) 7.5 MG tablet TAKE 1 TABLET BY MOUTH DAILY AT BEDTIME 90 tablet 3   Multiple Vitamin (MULTIVITAMIN WITH MINERALS) TABS tablet Take 1 tablet by mouth daily.     pioglitazone (ACTOS) 30 MG tablet TAKE 1 TABLET(30 MG) BY MOUTH DAILY (Patient taking differently: Take 30 mg by mouth every evening.) 30 tablet 5   potassium chloride (KLOR-CON M) 20 MEQ tablet Take 1 tablet (20 mEq total) by mouth 2 (two) times daily. 60 tablet 3   senna-docusate (SENOKOT-S) 8.6-50 MG tablet Take 1 tablet by mouth 2 (two) times daily between meals as needed for mild constipation.     sitaGLIPtin (JANUVIA) 100 MG tablet Take 1 tablet (100 mg total) by mouth daily. (Patient taking  differently: Take 100 mg by mouth every evening.) 90 tablet 3   tamsulosin (FLOMAX) 0.4 MG CAPS capsule TAKE 1 CAPSULE(0.4 MG) BY MOUTH DAILY 90 capsule 3          Home: Home Living Family/patient expects to be discharged to:: Private residence Living Arrangements: Other relatives (sister) Available Help at Discharge: Family Type of Home: House Home Access: Stairs to enter Secretary/administrator of Steps: 3 Entrance Stairs-Rails: Can reach both Home Layout: Two level Bathroom Shower/Tub: Health visitor: Standard Home Equipment: Medical laboratory scientific officer - single point  Lives With: Family   Functional History: Prior Function Prior Level of Function : Independent/Modified Independent, History of Falls (last six months) (Pt reports 1 fall in the last 6 months) Mobility Comments: Pt reports using a cane in the community and ambulates without anything within the home ADLs Comments: Completed ADLs independently. Pt prepares breakfast using microwave. Sister assists with cooking dinner.   Functional Status:  Mobility: Bed Mobility Overal bed mobility: Needs Assistance Bed Mobility:  Supine to Sit, Sit to Supine Supine to sit: Contact guard, HOB elevated, Used rails Sit to supine: Contact guard assist General bed mobility comments: CGA for safety. Verbal cues provided for sequencing. Transfers Overall transfer level: Needs assistance Equipment used: Rolling walker (2 wheels) Transfers: Sit to/from Stand Sit to Stand: Min assist General transfer comment: MIN A for power up to stand. cues for hand placement. Ambulation/Gait Ambulation/Gait assistance: Min assist Gait Distance (Feet): 75 Feet Assistive device: Rolling walker (2 wheels) Gait Pattern/deviations: Trunk flexed, Wide base of support, Decreased stride length, Step-through pattern, Drifts right/left General Gait Details: Min A for obstacle navigation. Pt noted to drift/lean to the R Gait velocity: decreased    ADL: ADL Overall ADL's : Needs assistance/impaired Eating/Feeding: Set up, Sitting Grooming: Set up, Sitting Upper Body Bathing: Minimal assistance, Sitting Lower Body Bathing: Minimal assistance, Sit to/from stand Upper Body Dressing : Minimal assistance, Sitting Lower Body Dressing: Moderate assistance, Sitting/lateral leans, Sit to/from stand Toilet Transfer: Contact guard assist Toileting- Clothing Manipulation and Hygiene: Minimal assistance, Sit to/from stand Functional mobility during ADLs: Minimal assistance, Rolling walker (2 wheels) General ADL Comments: Deficits in sensation and coordination of  RUE/RLE. <90 degree R shoulder flexion AROM and decreased grip strength of R hand. Pt reports difficulty maintaining grasp of RW with R hand. Pt with difficulty grasping and manipulating sock with R hand.   Cognition: Cognition Overall Cognitive Status: Within Functional Limits for tasks assessed Orientation Level: Oriented X4 Cognition Arousal: Alert Behavior During Therapy: WFL for tasks assessed/performed Overall Cognitive Status: Within Functional Limits for tasks assessed      Physical Exam: Blood pressure (!) 148/71, pulse 70, temperature 98.5 F (36.9 C), temperature source Oral, resp. rate 16, height (P) 5\' 4"  (1.626 m), weight (P) 70 kg, SpO2 98%.   General: No apparent distress HEENT: Head is normocephalic, atraumatic, sclera anicteric, oral mucosa pink and moist Neck: Supple without JVD or lymphadenopathy Heart: Reg rate and rhythm.  Chest: CTA bilaterally without wheezes, rales, or rhonchi; no distress Abdomen: Soft, non-tender, non-distended, bowel sounds positive. Extremities: No clubbing, cyanosis, or edema. Pulses are 2+ Psych: Pt's affect is appropriate. Pt is cooperative Skin: Clean and intact without signs of breakdown Neuro:    Mental Status: AAOx3, memory intact, fund of knowledge appropriate Speech/Languate: Naming and repetition intact, moderate  dysarthria, follows simple commands  CRANIAL NERVES: II: PERRL. Visual fields full III, IV, VI: EOM intact, no gaze preference or deviation V: normal sensation bilaterally VII: Mild R facial weakness VIII: normal hearing to speech IX, X: normal palatal elevation XI: 4/5 shoulder shrug on the right XII: Tongue deviates to RIght   MOTOR: RUE: 4/5 Deltoid, 4/5 Biceps, 4/5 Triceps,4/5 Grip LUE: 5/5 Deltoid, 5/5 Biceps, 5/5 Triceps, 5/5 Grip RLE: HF 4/5, KE 4+/5, ADF 4+/5, APF 4+/5 LLE: HF 4+/5, KE 5/5, ADF 5/5, APF 5/5   REFLEXES No ankle clonus  SENSORY: Altered sensation to LT RUE  Altered sensation b/l Feet- chronic neuropathy  Coordination: Normal finger to nose ataxia on R  MSK: R shoulder pain with PROM- chronic    Results for orders placed or performed during the hospital encounter of 02/04/24 (from the past 48 hours)  Glucose, capillary     Status: Abnormal   Collection Time: 02/06/24  3:59 PM  Result Value Ref Range   Glucose-Capillary 226 (H) 70 - 99 mg/dL    Comment: Glucose reference range applies only to samples taken after fasting for at least 8 hours.   Comment  1 Notify RN   Glucose, capillary     Status: Abnormal   Collection Time: 02/06/24  9:22 PM  Result Value Ref Range   Glucose-Capillary 142 (H) 70 - 99 mg/dL    Comment: Glucose reference range applies only to samples taken after fasting for at least 8 hours.  Renal function panel     Status: Abnormal   Collection Time: 02/07/24  6:56 AM  Result Value Ref Range   Sodium 136 135 - 145 mmol/L   Potassium 3.3 (L) 3.5 - 5.1 mmol/L   Chloride 109 98 - 111 mmol/L   CO2 23 22 - 32 mmol/L   Glucose, Bld 117 (H) 70 - 99 mg/dL    Comment: Glucose reference range applies only to samples taken after fasting for at least 8 hours.   BUN 9 8 - 23 mg/dL   Creatinine, Ser 1.61 0.61 - 1.24 mg/dL   Calcium 8.1 (L) 8.9 - 10.3 mg/dL   Phosphorus 3.2 2.5 - 4.6 mg/dL   Albumin 3.3 (L) 3.5 - 5.0 g/dL   GFR,  Estimated >09 >60 mL/min    Comment: (NOTE) Calculated using the CKD-EPI Creatinine Equation (2021)    Anion gap 4 (L) 5 - 15    Comment: Performed at [facility], [address], [place], [place] [address]  Magnesium     Status: None   Collection Time: 02/07/24  6:56 AM  Result Value Ref Range   Magnesium 1.9 1.7 - 2.4 mg/dL    Comment: Performed at [facility], [address], [place], [place] [address]  CBC     Status: None   Collection Time: 02/07/24  6:56 AM  Result Value Ref Range   WBC 6.4 4.0 - 10.5 K/uL   RBC 4.81 4.22 - 5.81 MIL/uL   Hemoglobin 13.8 13.0 - 17.0 g/dL   HCT 47.8 29.5 - 62.1 %   MCV 85.2 80.0 - 100.0 fL   MCH 28.7 26.0 - 34.0 pg   MCHC 33.7 30.0 - 36.0 g/dL   RDW 30.8 65.7 - 84.6 %   Platelets 262 150 - 400 K/uL   nRBC 0.0 0.0 - 0.2 %    Comment: Performed at [facility], [address] [place], [place] [address]  Glucose, capillary     Status: Abnormal   Collection Time: 02/07/24  8:07 AM  Result Value Ref Range   Glucose-Capillary 134 (H) 70 - 99 mg/dL    Comment: Glucose reference range applies only to samples taken after fasting for at least 8 hours.   Comment 1 Notify RN   Glucose, capillary     Status: Abnormal   Collection Time: 02/07/24 12:33 PM  Result Value Ref Range   Glucose-Capillary 194 (H) 70 - 99 mg/dL    Comment: Glucose reference range applies only to samples taken after fasting for at least 8 hours.   Comment 1 Notify RN    Comment 2 Document in Chart   Glucose, capillary     Status: Abnormal   Collection Time: 02/07/24  4:34 PM  Result Value Ref Range   Glucose-Capillary 189 (H) 70 - 99 mg/dL    Comment: Glucose reference range applies only to samples taken after fasting for at least 8 hours.  Glucose, capillary     Status: Abnormal   Collection Time: 02/07/24  9:18 PM  Result Value Ref Range   Glucose-Capillary 155 (H) 70 - 99 mg/dL    Comment: Glucose  reference range applies only to samples taken after  fasting for at least 8 hours.  Glucose, capillary     Status: Abnormal   Collection Time: 02/08/24  6:52 AM  Result Value Ref Range   Glucose-Capillary 115 (H) 70 - 99 mg/dL    Comment: Glucose reference range applies only to samples taken after fasting for at least 8 hours.  Renal function panel     Status: Abnormal   Collection Time: 02/08/24  9:14 AM  Result Value Ref Range   Sodium 137 135 - 145 mmol/L   Potassium 3.6 3.5 - 5.1 mmol/L   Chloride 107 98 - 111 mmol/L   CO2 24 22 - 32 mmol/L   Glucose, Bld 178 (H) 70 - 99 mg/dL    Comment: Glucose reference range applies only to samples taken after fasting for at least 8 hours.   BUN 11 8 - 23 mg/dL   Creatinine, Ser 1.61 0.61 - 1.24 mg/dL   Calcium 8.5 (L) 8.9 - 10.3 mg/dL   Phosphorus 3.1 2.5 - 4.6 mg/dL   Albumin 3.2 (L) 3.5 - 5.0 g/dL   GFR, Estimated >09 >60 mL/min    Comment: (NOTE) Calculated using the CKD-EPI Creatinine Equation (2021)    Anion gap 6 5 - 15    Comment: Performed at [facility], [address] [place], [place] [address]  Magnesium     Status: None   Collection Time: 02/08/24  9:14 AM  Result Value Ref Range   Magnesium 1.7 1.7 - 2.4 mg/dL    Comment: Performed at [facility], [address], [place], [place] [address]  Glucose, capillary     Status: Abnormal   Collection Time: 02/08/24 11:33 AM  Result Value Ref Range   Glucose-Capillary 177 (H) 70 - 99 mg/dL    Comment: Glucose reference range applies only to samples taken after fasting for at least 8 hours.   Comment 1 Notify RN    No results found.    Blood pressure (!) 148/71, pulse 70, temperature 98.5 F (36.9 C), temperature source Oral, resp. rate 16, height (P) 5\' 4"  (1.626 m), weight (P) 70 kg, SpO2 98%.  Medical Problem List and Plan: 1. Functional deficits secondary to acute CVA  left corona radiata, left lentiform nucleus and posterior limb of left internal capsule likely due to large vessel disease  -patient may   shower  -ELOS/Goals: 7-10 days, PT/OT/SLP min A/sup  -Admit to CIR 2.  Antithrombotics: -DVT/anticoagulation:  Pharmaceutical: Lovenox  -antiplatelet therapy: DAPT X 3 months followed by Plavix alone.  3. Pain Management: Tylenol prn. Add voltaren gel to right hip and left knee.  4. Mood/Behavior/Sleep: LCSW to follow for evaluation and support.   -antipsychotic agents: N/A 5. Neuropsych/cognition: This patient is capable of making decisions on his own behalf. 6. Skin/Wound Care: Routine pressure relief measures.  7. Fluids/Electrolytes/Nutrition: Monitor I/O. Check CMET IN AM. HTN: Monitor BP TID--on Lotensin.  8. HTN: Monitor BP TID--continue Lisinopri/amlodipine. Avoid hypoperfusion.      02/08/2024    3:14 PM 02/08/2024   11:32 AM 02/08/2024    8:23 AM  Vitals with BMI  Height 5\' 4"     Weight 154 lbs 5 oz    BMI 26.48    Systolic 148 151 478  Diastolic 71 68 67  Pulse 70 66 65      9.  T2DM with neuropathy: Hgb A1C- 8.8. Monitor BS ac/hs and use SSI for elevated BS.   --  was on jardiance at home-->will hold due to nectar liquids and concerns for dehydration  CBG (last 3)  Recent Labs    02/07/24 2118 02/08/24 0652 02/08/24 1133  GLUCAP 155* 115* 177*    10. Hypokalemia: Improved after supplement. Recheck in am.  11. Hypomagnesemia: Improved after supplement. Recheck in am.  12. BPH?: On flomax.  13. Melanoma with lung/brain mets: Treated with immunotherapy and Gamma knife. In remission. F/U outpatient [facility] specialist.  14. Hx of chronic calcific pancreatitis: Has been sober for 2 years.  15. Hyperlipidemia: LDL at goal.  Lipitor 20mg  was started.  16. Facial rash.  Suspect this is seborrheic dermatitis. He uses triamcinolone cream at home for this, has not tried any other creams previously.  -Will start ketoconazole cream, triamcinolone cream as needed 17. Hx of GI bleed 2023  -Continue PPI 18. Dysphagia  -Continue SLP  Jacquelynn Cree, PA-C 02/08/2024  I have  personally performed a face to face diagnostic evaluation of this patient and formulated the key components of the plan.  Additionally, I have personally reviewed laboratory data, imaging studies, as well as relevant notes and concur with the physician assistant's documentation above.  The patient's status has not changed from the original H&P.  Any changes in documentation from the acute care chart have been noted above.  Fanny Dance, MD, [place]

## 2024-02-09 DIAGNOSIS — I639 Cerebral infarction, unspecified: Secondary | ICD-10-CM

## 2024-02-09 LAB — CBC WITH DIFFERENTIAL/PLATELET
Abs Immature Granulocytes: 0.01 10*3/uL (ref 0.00–0.07)
Basophils Absolute: 0.1 10*3/uL (ref 0.0–0.1)
Basophils Relative: 1 %
Eosinophils Absolute: 0.6 10*3/uL — ABNORMAL HIGH (ref 0.0–0.5)
Eosinophils Relative: 10 %
HCT: 38 % — ABNORMAL LOW (ref 39.0–52.0)
Hemoglobin: 13 g/dL (ref 13.0–17.0)
Immature Granulocytes: 0 %
Lymphocytes Relative: 29 %
Lymphs Abs: 1.7 10*3/uL (ref 0.7–4.0)
MCH: 29.4 pg (ref 26.0–34.0)
MCHC: 34.2 g/dL (ref 30.0–36.0)
MCV: 86 fL (ref 80.0–100.0)
Monocytes Absolute: 0.7 10*3/uL (ref 0.1–1.0)
Monocytes Relative: 12 %
Neutro Abs: 2.9 10*3/uL (ref 1.7–7.7)
Neutrophils Relative %: 48 %
Platelets: 254 10*3/uL (ref 150–400)
RBC: 4.42 MIL/uL (ref 4.22–5.81)
RDW: 13.8 % (ref 11.5–15.5)
WBC: 6 10*3/uL (ref 4.0–10.5)
nRBC: 0 % (ref 0.0–0.2)

## 2024-02-09 LAB — COMPREHENSIVE METABOLIC PANEL
ALT: 19 U/L (ref 0–44)
AST: 17 U/L (ref 15–41)
Albumin: 3.2 g/dL — ABNORMAL LOW (ref 3.5–5.0)
Alkaline Phosphatase: 47 U/L (ref 38–126)
Anion gap: 6 (ref 5–15)
BUN: 9 mg/dL (ref 8–23)
CO2: 26 mmol/L (ref 22–32)
Calcium: 8.6 mg/dL — ABNORMAL LOW (ref 8.9–10.3)
Chloride: 108 mmol/L (ref 98–111)
Creatinine, Ser: 1.2 mg/dL (ref 0.61–1.24)
GFR, Estimated: >60 mL/min (ref >60)
Glucose, Bld: 110 mg/dL — ABNORMAL HIGH (ref 70–99)
Potassium: 3.7 mmol/L (ref 3.5–5.1)
Sodium: 140 mmol/L (ref 135–145)
Total Bilirubin: 0.6 mg/dL (ref 0.0–1.2)
Total Protein: 6.1 g/dL — ABNORMAL LOW (ref 6.5–8.1)

## 2024-02-09 LAB — GLUCOSE, CAPILLARY
Glucose-Capillary: 106 mg/dL — ABNORMAL HIGH (ref 70–99)
Glucose-Capillary: 133 mg/dL — ABNORMAL HIGH (ref 70–99)
Glucose-Capillary: 173 mg/dL — ABNORMAL HIGH (ref 70–99)
Glucose-Capillary: 100 mg/dL — ABNORMAL HIGH (ref 70–99)

## 2024-02-09 NOTE — Evaluation (Signed)
 Physical Therapy Assessment and Plan  Patient Details  Name: Kenneth [name] MRN: [id number] Date of Birth: 1961/06/10  PT Diagnosis: Difficulty walking, Hemiplegia dominant, and Muscle weakness Rehab Potential: Good ELOS: 7-10 Days   Today's Date: 02/09/2024 PT Individual Time: 4098-1191 PT Individual Time Calculation (min): 73 min    Hospital Problem: Principal Problem:   Stroke (cerebrum) [facility])   Past Medical History:  Past Medical History:  Diagnosis Date   Alcohol dependence (HCC)    Chronic calcific pancreatitis (HCC)    CVA (cerebral vascular accident) (HCC) 01/24/2022   Diabetes mellitus without complication (HCC)    Hyperlipidemia    Hypertension    Melanoma (HCC) 2013   metastatic - resected and cured with chemo/immuno tx   Pancreatic cyst-mass?    Polyneuropathy    Past Surgical History:  Past Surgical History:  Procedure Laterality Date   BIOPSY  02/03/2022   Procedure: BIOPSY;  Surgeon: Iva Boop, MD;  Location: [facility] ENDOSCOPY;  Service: Gastroenterology;;   BRAIN SURGERY  2015   to check for possible malignancy from melanoma, result were scar tissue   COLONOSCOPY WITH PROPOFOL N/A 02/03/2022   Procedure: COLONOSCOPY WITH PROPOFOL;  Surgeon: Iva Boop, MD;  Location: [facility] ENDOSCOPY;  Service: Gastroenterology;  Laterality: N/A;   ESOPHAGOGASTRODUODENOSCOPY (EGD) WITH PROPOFOL N/A 02/03/2022   Procedure: ESOPHAGOGASTRODUODENOSCOPY (EGD) WITH PROPOFOL;  Surgeon: Iva Boop, MD;  Location: [facility] ENDOSCOPY;  Service: Gastroenterology;  Laterality: N/A;   melanoma exicison     right leg    Assessment & Plan Clinical Impression: Patient is a 63 y.o. right handed male with history of HTN, CVA with residual mild right sided weakness 2023, recent falls due to RLE weakness (referred to outpatient PT), [place], ETOH abuse with pancreatitis, T2DM, polyneuropathy,  melanoma with mets to brain and lungs [facility] 2003)- in remission, who was admitted on 02/04/24 with  reports of right hand numbness with weakness as well as right facial weakness with slurred speech. CTA head/neck showed severe stenosis right PCA P3 segment and severe stenosis at origin of M2 L-MCA  He was found to have two acute infarcts in left corona radiata, left lentiform nucleus and posterior limb left internal capsule, moderate chronic cortical encephalomalacia/gliosis anterior left frontal lobe due to cranioplasty and severe chronic micro hemorrhages felt to be sequale of hypertensive microangiopathy. 2D echo showed EF 60-65% with no wall abnormality, trivial TVR and trivial pericardial effusion.  MBS done revealing moderate oropharyngeal dysphagia with silent aspiration of thins and nectars and chin tuck effective in preventing aspiration of nectars. Dr. Roda Shutters felt that stroke was due to large vessel disease and recommended DAPT X 3 months followed by Plavix alone.  He was started on lipitor 20mg .    Patient continues to be limited by right sided weakness with RUE ataxia, mild right facial droop with dysarthria as well as dysphagia requiring modified diet. He currently requires min assist with PT/OT. He was independent with cane PTA.  Patient transferred to CIR on 02/08/2024 .   Patient currently requires min with mobility secondary to muscle weakness, decreased cardiorespiratoy endurance, decreased coordination, and decreased sitting balance, decreased standing balance, decreased postural control, hemiplegia, and decreased balance strategies.  Prior to hospitalization, patient was modified independent  with mobility and lived with Family in a House home.  Home access is 3Stairs to enter.  Patient will benefit from skilled PT intervention to maximize safe functional mobility, minimize fall risk, and decrease caregiver burden for planned discharge home with  intermittent assist.  Anticipate patient will benefit from follow up OP at discharge.  PT - End of Session Endurance Deficit: No   PT  Evaluation Precautions/Restrictions Precautions Precautions: Fall Recall of Precautions/Restrictions: Intact Restrictions Weight Bearing Restrictions Per Provider Order: No General Chart Reviewed: Yes Family/Caregiver Present: No  Pain Pain Assessment Pain Score: 0-No pain Pain Interference Pain Interference Pain Effect on Sleep: 1. Rarely or not at all Pain Interference with Therapy Activities: 1. Rarely or not at all Pain Interference with Day-to-Day Activities: 1. Rarely or not at all Home Living/Prior Functioning Home Living Type of Home: House Home Access: Stairs to enter Entrance Stairs-Number of Steps: 3 Entrance Stairs-Rails: Can reach both Home Layout: Two level Bathroom Shower/Tub: Walk-in shower  Lives With: Family Prior Function Level of Independence: Independent with basic ADLs;Independent with gait;Independent with transfers  Able to Take Stairs?: Yes Vision/Perception  Vision - History Ability to See in Adequate Light: 0 Adequate Perception Perception: Within Functional Limits Praxis Praxis: WFL  Cognition Overall Cognitive Status: Within Functional Limits for tasks assessed Arousal/Alertness: Awake/alert Orientation Level: Oriented X4 Year: 2025 Month: March Day of Week: Correct Memory: Appears intact Awareness: Appears intact Problem Solving: Appears intact Safety/Judgment: Appears intact Sensation Sensation Light Touch: Impaired Detail Peripheral sensation comments: pt reports R hand thumb and 1st 2 digits are numb; peripheral neuropathy in B feet Light Touch Impaired Details: Impaired RUE;Impaired RLE;Impaired LLE Hot/Cold: Appears Intact Proprioception: Appears Intact Stereognosis: Impaired by gross assessment Coordination Gross Motor Movements are Fluid and Coordinated: No Fine Motor Movements are Fluid and Coordinated: No Coordination and Movement Description: R FMC limited by decreased sensation, decrease coordination of RLE Finger  Nose Finger Test: slow but accurate on R side Motor  Motor Motor: Hemiplegia;Ataxia  Trunk/Postural Assessment  Postural Control Postural Control:  (functional in sitting, in standing posterior lean when he first stands but able to self correct with cues.)  Balance Dynamic Sitting Balance Dynamic Sitting - Level of Assistance: 5: Stand by assistance Static Standing Balance Static Standing - Level of Assistance: 4: Min assist Dynamic Standing Balance Dynamic Standing - Level of Assistance: 3: Mod assist Extremity Assessment  RUE Assessment Passive Range of Motion (PROM) Comments: sh flexion 160 Active Range of Motion (AROM) Comments: sh flexion 120 General Strength Comments: triceps 4-/5, biceps 3+/5,  grasp 4+/5 (numb in thumb and 1st 2 digits) - difficulty with FMC LUE Assessment LUE Assessment: Within Functional Limits RLE Assessment RLE Assessment: Exceptions to [facility] General Strength Comments: Grossly 3+/5 LLE Assessment LLE Assessment: Exceptions to [facility] General Strength Comments: Grossly 4/5  Care Tool Care Tool Bed Mobility Roll left and right activity   Roll left and right assist level: Supervision/Verbal cueing    Sit to lying activity   Sit to lying assist level: Supervision/Verbal cueing    Lying to sitting on side of bed activity   Lying to sitting on side of bed assist level: the ability to move from lying on the back to sitting on the side of the bed with no back support.: Supervision/Verbal cueing     Care Tool Transfers Sit to stand transfer   Sit to stand assist level: Minimal Assistance - Patient > 75%    Chair/bed transfer   Chair/bed transfer assist level: Minimal Assistance - Patient > 75%    Car transfer   Car transfer assist level: Minimal Assistance - Patient > 75%      Care Tool Locomotion Ambulation   Assist level: Minimal Assistance - Patient > 75%  Assistive device:  (hurrycane) Max distance: 200'  Walk 10 feet activity   Assist level:  Minimal Assistance - Patient > 75% Assistive device:  (hurrycane)   Walk 50 feet with 2 turns activity   Assist level: Minimal Assistance - Patient > 75% Assistive device:  (hurrycane)  Walk 150 feet activity   Assist level: Minimal Assistance - Patient > 75% Assistive device:  (hurrycane)  Walk 10 feet on uneven surfaces activity   Assist level: Minimal Assistance - Patient > 75%    Stairs   Assist level: Minimal Assistance - Patient > 75% Stairs assistive device: 2 hand rails Max number of stairs: 12  Walk up/down 1 step activity   Walk up/down 1 step (curb) assist level: Minimal Assistance - Patient > 75% Walk up/down 1 step or curb assistive device: 2 hand rails  Walk up/down 4 steps activity   Walk up/down 4 steps assist level: Minimal Assistance - Patient > 75% Walk up/down 4 steps assistive device: 2 hand rails  Walk up/down 12 steps activity   Walk up/down 12 steps assist level: Minimal Assistance - Patient > 75% Walk up/down 12 steps assistive device: 2 hand rails  Pick up small objects from floor   Pick up small object from the floor assist level: Minimal Assistance - Patient > 75%    Wheelchair Is the patient using a wheelchair?: Yes Type of Wheelchair: Manual   Wheelchair assist level: Dependent - Patient 0% Max wheelchair distance: 150'  Wheel 50 feet with 2 turns activity   Assist Level: Dependent - Patient 0%  Wheel 150 feet activity   Assist Level: Dependent - Patient 0%    Refer to Care Plan for Long Term Goals  SHORT TERM GOAL WEEK 1 PT Short Term Goal 1 (Week 1): STGs = LTGs  Recommendations for other services: Therapeutic Recreation  Other dance  Skilled Therapeutic Intervention  Evaluation completed (see details above and below) with education on PT POC and goals and individual treatment initiated with focus on bed mobility, balance, transfers, ambulation, car transfers, and stair training. Pt received seated in recliner and agrees to therapy. No  complaint of pain. Sit to stand with minA and RW, with cues for sequencing and anterior weight shift. Pt ambulates x100' to gym with RW and cues for upright gaze to improve posture and balance, and increasing gait speed to decrease risk for falls. Following rest break, pt completes ramp navigation and car transfer with RW and minA, with cues for sequencing and safety. Pt ambulates to main gym with RW and same cues. Pt then completes x12 6" steps with minA and bilateral hand rails, with cues for step sequencing and safety. Seated rest break. Pt then ambulates x200' with hurrycane and minA, with cues for reciprocal gait pattern and increasing Rt stance time. Pt completes x12:00 on Nustep for endurance training and reciprocal coordination training. PT provides cues for hand and foot placement and completing full available ROM. Pt completes at workload of 5 with average step per minute ~50. WC transport back to room. Stand step to bed with cues for eccentric control of stand to sit for safety. Pt left supine in bed with alarm intact and all needs within reach.   Mobility Bed Mobility Bed Mobility: Supine to Sit;Sit to Supine Supine to Sit: Contact Guard/Touching assist Sit to Supine: Contact Guard/Touching assist Transfers Transfers: Sit to Stand;Stand to Sit;Stand Pivot Transfers Sit to Stand: Minimal Assistance - Patient > 75% Stand to Sit: Minimal Assistance -  Patient > 75% Stand Pivot Transfers: Minimal Assistance - Patient > 75% Stand Pivot Transfer Details: Verbal cues for sequencing;Verbal cues for technique;Tactile cues for sequencing;Verbal cues for precautions/safety Transfer (Assistive device): None Locomotion  Gait Ambulation: Yes Gait Assistance: Minimal Assistance - Patient > 75% Gait Distance (Feet): 200 Feet Assistive device:  (hurrycane) Gait Assistance Details: Verbal cues for sequencing;Verbal cues for technique;Verbal cues for precautions/safety;Tactile cues for  posture Gait Gait: Yes Gait Pattern: Impaired Gait Pattern: Decreased stance time - right Gait velocity: decreased Stairs / Additional Locomotion Stairs: Yes Stairs Assistance: Minimal Assistance - Patient > 75% Stair Management Technique: Two rails Number of Stairs: 12 Height of Stairs: 6 Ramp: Minimal Assistance - Patient >75% Curb: Minimal Assistance - Patient >75% Wheelchair Mobility Wheelchair Mobility: No   Discharge Criteria: Patient will be discharged from PT if patient refuses treatment 3 consecutive times without medical reason, if treatment goals not met, if there is a change in medical status, if patient makes no progress towards goals or if patient is discharged from hospital.  The above assessment, treatment plan, treatment alternatives and goals were discussed and mutually agreed upon: by patient  Beau Fanny, PT, DPT 02/09/2024, 3:57 PM

## 2024-02-09 NOTE — Evaluation (Signed)
 Occupational Therapy Assessment and Plan  Patient Details  Name: Kenneth [name] MRN: [id number] Date of Birth: [birth year]  OT Diagnosis: ataxia, hemiplegia affecting dominant side, and decreased sensation  Rehab Potential: Rehab Potential (ACUTE ONLY): Good ELOS: 7-10 days   Today's Date: 02/09/2024 OT Individual Time: 4098-1191 OT Individual Time Calculation (min): 75 min     Hospital Problem: Principal Problem:   Stroke (cerebrum) [facility])   Past Medical History:  Past Medical History:  Diagnosis Date   Alcohol dependence (HCC)    Chronic calcific pancreatitis (HCC)    CVA (cerebral vascular accident) (HCC) 01/24/2022   Diabetes mellitus without complication (HCC)    Hyperlipidemia    Hypertension    Melanoma (HCC) 2013   metastatic - resected and cured with chemo/immuno tx   Pancreatic cyst-mass?    Polyneuropathy    Past Surgical History:  Past Surgical History:  Procedure Laterality Date   BIOPSY  02/03/2022   Procedure: BIOPSY;  Surgeon: Iva Boop, MD;  Location: [facility] ENDOSCOPY;  Service: Gastroenterology;;   BRAIN SURGERY  2015   to check for possible malignancy from melanoma, result were scar tissue   COLONOSCOPY WITH PROPOFOL N/A 02/03/2022   Procedure: COLONOSCOPY WITH PROPOFOL;  Surgeon: Iva Boop, MD;  Location: [facility] ENDOSCOPY;  Service: Gastroenterology;  Laterality: N/A;   ESOPHAGOGASTRODUODENOSCOPY (EGD) WITH PROPOFOL N/A 02/03/2022   Procedure: ESOPHAGOGASTRODUODENOSCOPY (EGD) WITH PROPOFOL;  Surgeon: Iva Boop, MD;  Location: [facility] ENDOSCOPY;  Service: Gastroenterology;  Laterality: N/A;   melanoma exicison     right leg    Assessment & Plan Clinical Impression: Kenneth [name] is a right handed male with history of HTN, CVA with residual mild right sided weakness 2023, recent falls due to RLE weakness (referred to outpatient PT), [place], ETOH abuse with pancreatitis, T2DM, polyneuropathy,  melanoma with mets to brain and lungs [facility] 2003)- in  remission, who was admitted on 02/04/24 with reports of right hand numbness with weakness as well as right facial weakness with slurred speech. CTA head/neck showed severe stenosis right PCA P3 segment and severe stenosis at origin of M2 L-MCA  He was found to have two acute infarcts in left corona radiata, left lentiform nucleus and posterior limb left internal capsule, moderate chronic cortical encephalomalacia/gliosis anterior left frontal lobe due to cranioplasty and severe chronic micro hemorrhages felt to be sequale of hypertensive microangiopathy. 2D echo showed EF 60-65% with no wall abnormality, trivial TVR and trivial pericardial effusion.  MBS done revealing moderate oropharyngeal dysphagia with silent aspiration of thins and nectars and chin tuck effective in preventing aspiration of nectars. Dr. Roda Shutters felt that stroke was due to large vessel disease and recommended DAPT X 3 months followed by Plavix alone.  He was started on lipitor 20mg .    Patient continues to be limited by right sided weakness with RUE ataxia, mild right facial droop with dysarthria as well as dysphagia requiring modified diet. He currently requires min assist with PT/OT. He was independent with cane PTA. CIR recommended due to functional decline.    Lives in 2 story townhome with sister, made BR in dining room. 3 STE    .  Patient transferred to CIR on 02/08/2024 .    Patient currently requires min with basic self-care skills secondary to unbalanced muscle activation and decreased coordination and decreased standing balance, hemiplegia, and decreased balance strategies.  Prior to hospitalization, patient could complete BADLs with modified independent .  Patient will benefit from skilled intervention to increase  independence with basic self-care skills prior to discharge home with care partner.  Anticipate patient will require intermittent supervision and follow up home health.  OT - End of Session Activity Tolerance:  Tolerates 30+ min activity with multiple rests Endurance Deficit: No OT Assessment Rehab Potential (ACUTE ONLY): Good OT Patient demonstrates impairments in the following area(s): Balance;Motor;Sensory OT Basic ADL's Functional Problem(s): Bathing;Dressing;Toileting;Grooming;Eating OT Transfers Functional Problem(s): Toilet;Tub/Shower OT Additional Impairment(s): Fuctional Use of Upper Extremity OT Plan OT Intensity: Minimum of 1-2 x/day, 45 to 90 minutes OT Frequency: 5 out of 7 days OT Duration/Estimated Length of Stay: 7-10 days OT Treatment/Interventions: Balance/vestibular training;Discharge planning;Functional mobility training;Neuromuscular re-education;Psychosocial support;Patient/family education;Self Care/advanced ADL retraining;Therapeutic Exercise;Therapeutic Activities;UE/LE Strength taining/ROM;DME/adaptive equipment instruction;UE/LE Coordination activities OT Self Feeding Anticipated Outcome(s): independent OT Basic Self-Care Anticipated Outcome(s): Mod independent OT Toileting Anticipated Outcome(s): Mod Independent OT Bathroom Transfers Anticipated Outcome(s): Mod I to toilet, supervision to shower OT Recommendation Patient destination: Home Follow Up Recommendations: Home health OT Equipment Recommended: To be determined   OT Evaluation Precautions/Restrictions  Precautions Precautions: Fall Recall of Precautions/Restrictions: Intact Restrictions Weight Bearing Restrictions Per Provider Order: No   Pain Pain Assessment Pain Score: 0-No pain Home Living/Prior Functioning Home Living Family/patient expects to be discharged to:: Private residence Living Arrangements: Other relatives Type of Home: House Home Access: Stairs to enter Secretary/administrator of Steps: 3 Entrance Stairs-Rails: Can reach both Home Layout: Two level Bathroom Shower/Tub: Walk-in shower  Lives With: Family Prior Function Level of Independence: Independent with basic ADLs,  Independent with gait, Independent with transfers  Able to Take Stairs?: Yes Vision Baseline Vision/History: 1 Wears glasses Ability to See in Adequate Light: 0 Adequate Patient Visual Report: No change from baseline Vision Assessment?: No apparent visual deficits Perception  Perception: Within Functional Limits Praxis Praxis: WFL Cognition Cognition Overall Cognitive Status: Within Functional Limits for tasks assessed Arousal/Alertness: Awake/alert Memory: Appears intact Awareness: Appears intact Problem Solving: Appears intact Safety/Judgment: Appears intact Brief Interview for Mental Status (BIMS) Repetition of Three Words (First Attempt): 3 Temporal Orientation: Year: Correct Temporal Orientation: Month: Accurate within 5 days Temporal Orientation: Day: Correct Recall: "Sock": Yes, after cueing ("something to wear") Recall: "Blue": Yes, no cue required Recall: "Bed": Yes, no cue required BIMS Summary Score: 14 Sensation Sensation Light Touch: Impaired Detail Peripheral sensation comments: pt reports R hand thumb and 1st 2 digits are numb; peripheral neuropathy in B feet Light Touch Impaired Details: Impaired RUE;Impaired RLE;Impaired LLE Hot/Cold: Appears Intact Proprioception: Appears Intact Stereognosis: Impaired by gross assessment Coordination Gross Motor Movements are Fluid and Coordinated: No Fine Motor Movements are Fluid and Coordinated: No Coordination and Movement Description: R FMC limited by decreased sensation, decrease coordination of RLE Finger Nose Finger Test: slow but accurate on R side Motor  Motor Motor: Hemiplegia;Ataxia  Trunk/Postural Assessment  Postural Control Postural Control:  (functional in sitting, in standing posterior lean when he first stands but able to self correct with cues.)  Balance Dynamic Sitting Balance Dynamic Sitting - Level of Assistance: 5: Stand by assistance Static Standing Balance Static Standing - Level of  Assistance: 4: Min assist Dynamic Standing Balance Dynamic Standing - Level of Assistance: 3: Mod assist Extremity/Trunk Assessment RUE Assessment Passive Range of Motion (PROM) Comments: sh flexion 160 Active Range of Motion (AROM) Comments: sh flexion 120 General Strength Comments: triceps 4-/5, biceps 3+/5,  grasp 4+/5 (numb in thumb and 1st 2 digits) - difficulty with FMC LUE Assessment LUE Assessment: Within Functional Limits  Care Tool Care Tool Self Care  Eating   Eating Assist Level: Set up assist    Oral Care    Oral Care Assist Level: Set up assist    Bathing   Body parts bathed by patient: Right arm;Left arm;Chest;Abdomen;Front perineal area;Buttocks;Right upper leg;Face;Left lower leg;Right lower leg;Left upper leg     Assist Level: Contact Guard/Touching assist    Upper Body Dressing(including orthotics)   What is the patient wearing?: Pull over shirt   Assist Level: Supervision/Verbal cueing    Lower Body Dressing (excluding footwear)   What is the patient wearing?: Underwear/pull up;Pants Assist for lower body dressing: Contact Guard/Touching assist    Putting on/Taking off footwear   What is the patient wearing?: Non-skid slipper socks Assist for footwear: Supervision/Verbal cueing       Care Tool Toileting Toileting activity   Assist for toileting: Contact Guard/Touching assist     Care Tool Bed Mobility Roll left and right activity   Roll left and right assist level: Supervision/Verbal cueing    Sit to lying activity        Lying to sitting on side of bed activity   Lying to sitting on side of bed assist level: the ability to move from lying on the back to sitting on the side of the bed with no back support.: Supervision/Verbal cueing     Care Tool Transfers Sit to stand transfer   Sit to stand assist level: Minimal Assistance - Patient > 75%    Chair/bed transfer   Chair/bed transfer assist level: Minimal Assistance - Patient > 75%      Toilet transfer   Assist Level: Minimal Assistance - Patient > 75%     Care Tool Cognition  Expression of Ideas and Wants Expression of Ideas and Wants: 3. Some difficulty - exhibits some difficulty with expressing needs and ideas (e.g, some words or finishing thoughts) or speech is not clear  Understanding Verbal and Non-Verbal Content Understanding Verbal and Non-Verbal Content: 4. Understands (complex and basic) - clear comprehension without cues or repetitions   Memory/Recall Ability Memory/Recall Ability : Current season;That he or she is in a hospital/hospital unit   Refer to Care Plan for Long Term Goals  SHORT TERM GOAL WEEK 1 OT Short Term Goal 1 (Week 1): Pt will demonstrate improved RLE coordination to ambulate to bathroom with LRAD with supervision. OT Short Term Goal 2 (Week 1): Pt will demonstrate improved R FMC to open lunch tray items and cut his food independently.  Recommendations for other services: None    Skilled Therapeutic Intervention ADL ADL Eating: Set up Grooming: Setup (pt able to open toothpaste, apply to brush, and complete oral care without A from therapist) Where Assessed-Grooming: Sitting at sink Upper Body Bathing: Supervision/safety Where Assessed-Upper Body Bathing: Shower Lower Body Bathing: Contact guard Where Assessed-Lower Body Bathing: Shower Upper Body Dressing: Supervision/safety Lower Body Dressing: Contact guard Toileting: Contact guard Where Assessed-Toileting: Teacher, adult education: Curator Method: Proofreader: Raised toilet seat Film/video editor: Insurance underwriter Method: Designer, industrial/product: Transfer tub bench;Grab bars   Pt seen for initial evaluation and ADL training with a focus on balance and functional mobility.  Pt eager to shower.  He completed sit to stands 3x from EOB with heavy min A due to a posterior lean. With cues,   pt then was able to sit to stand from [facility], tub bench, recliner with improved balance. He did need cues to reach back when sitting.  Pt used R hand fairly well during session with basic [facility] but writing and other [facility] tasks will need to be assessed.  Reviewed role of OT, discussed POC and pt's goals, and ELOS. Pt resting in recliner  With all needs met and  chair belt alarm set.      Discharge Criteria: Patient will be discharged from OT if patient refuses treatment 3 consecutive times without medical reason, if treatment goals not met, if there is a change in medical status, if patient makes no progress towards goals or if patient is discharged from hospital.  The above assessment, treatment plan, treatment alternatives and goals were discussed and mutually agreed upon: by patient  [facility] 02/09/2024, 12:59 PM

## 2024-02-09 NOTE — Plan of Care (Signed)
  Problem: RH Balance Goal: LTG Patient will maintain dynamic standing with ADLs (OT) Description: LTG:  Patient will maintain dynamic standing balance with assist during activities of daily living (OT)  Flowsheets (Taken 02/09/2024 1516) LTG: Pt will maintain dynamic standing balance during ADLs with: Independent with assistive device   Problem: Sit to Stand Goal: LTG:  Patient will perform sit to stand in prep for activites of daily living with assistance level (OT) Description: LTG:  Patient will perform sit to stand in prep for activites of daily living with assistance level (OT) Flowsheets (Taken 02/09/2024 1516) LTG: PT will perform sit to stand in prep for activites of daily living with assistance level: Independent with assistive device   Problem: RH Bathing Goal: LTG Patient will bathe all body parts with assist levels (OT) Description: LTG: Patient will bathe all body parts with assist levels (OT) Flowsheets (Taken 02/09/2024 1516) LTG: Pt will perform bathing with assistance level/cueing: Independent with assistive device  LTG: Position pt will perform bathing: Shower   Problem: RH Dressing Goal: LTG Patient will perform upper body dressing (OT) Description: LTG Patient will perform upper body dressing with assist, with/without cues (OT). Flowsheets (Taken 02/09/2024 1516) LTG: Pt will perform upper body dressing with assistance level of: Independent Goal: LTG Patient will perform lower body dressing w/assist (OT) Description: LTG: Patient will perform lower body dressing with assist, with/without cues in positioning using equipment (OT) Flowsheets (Taken 02/09/2024 1516) LTG: Pt will perform lower body dressing with assistance level of: Independent with assistive device   Problem: RH Toileting Goal: LTG Patient will perform toileting task (3/3 steps) with assistance level (OT) Description: LTG: Patient will perform toileting task (3/3 steps) with assistance level (OT)  Flowsheets  (Taken 02/09/2024 1516) LTG: Pt will perform toileting task (3/3 steps) with assistance level: Independent with assistive device   Problem: RH Functional Use of Upper Extremity Goal: LTG Patient will use RT/LT upper extremity as a (OT) Description: LTG: Patient will use right/left upper extremity as a stabilizer/gross assist/diminished/nondominant/dominant level with assist, with/without cues during functional activity (OT) Flowsheets (Taken 02/09/2024 1516) LTG: Use of upper extremity in functional activities: RUE as diminished level LTG: Pt will use upper extremity in functional activity with assistance level of: Independent   Problem: RH Toilet Transfers Goal: LTG Patient will perform toilet transfers w/assist (OT) Description: LTG: Patient will perform toilet transfers with assist, with/without cues using equipment (OT) Flowsheets (Taken 02/09/2024 1516) LTG: Pt will perform toilet transfers with assistance level of: Independent with assistive device   Problem: RH Tub/Shower Transfers Goal: LTG Patient will perform tub/shower transfers w/assist (OT) Description: LTG: Patient will perform tub/shower transfers with assist, with/without cues using equipment (OT) Flowsheets (Taken 02/09/2024 1516) LTG: Pt will perform tub/shower stall transfers with assistance level of: Supervision/Verbal cueing LTG: Pt will perform tub/shower transfers from: Walk in shower

## 2024-02-09 NOTE — Plan of Care (Signed)
  Problem: RH Swallowing Goal: LTG Patient will consume least restrictive diet using compensatory strategies with assistance (SLP) Description: LTG:  Patient will consume least restrictive diet using compensatory strategies with assistance (SLP) Flowsheets (Taken 02/09/2024 1213) LTG: Pt Patient will consume least restrictive diet using compensatory strategies with assistance of (SLP): Supervision Goal: LTG Patient will participate in dysphagia therapy to increase swallow function with assistance (SLP) Description: LTG:  Patient will participate in dysphagia therapy to increase swallow function with assistance (SLP) Flowsheets (Taken 02/09/2024 1213) LTG: Pt will participate in dysphagia therapy to increase swallow function with assistance of (SLP): Supervision Goal: LTG Pt will demonstrate functional change in swallow as evidenced by bedside/clinical objective assessment (SLP) Description: LTG: Patient will demonstrate functional change in swallow as evidenced by bedside/clinical objective assessment (SLP) Flowsheets (Taken 02/09/2024 1213) LTG: Patient will demonstrate functional change in swallow as evidenced by bedside/clinical objective assessment: Oropharyngeal swallow   Problem: RH Expression Communication Goal: LTG Patient will increase speech intelligibility (SLP) Description: LTG: Patient will increase speech intelligibility at word/phrase/conversation level with cues, % of the time (SLP) Flowsheets (Taken 02/09/2024 1213) LTG: Patient will increase speech intelligibility (SLP): Supervision Level: Conversation level Percent of time patient will use intelligible speech: 90

## 2024-02-09 NOTE — Progress Notes (Signed)
 PROGRESS NOTE   Subjective/Complaints: Pt reports hasn't ordered breakfast, so needs to do so. LBM yesterday  No issues otherwise Ready to start therapy  ROS:  Pt denies SOB, abd pain, CP, N/V/C/D, and vision changes   Objective:   No results found. Recent Labs    02/07/24 0656 02/09/24 0636  WBC 6.4 6.0  HGB 13.8 13.0  HCT 41.0 38.0*  PLT 262 254   Recent Labs    02/08/24 1634 02/09/24 0636  NA 138 140  K 3.8 3.7  CL 107 108  CO2 24 26  GLUCOSE 180* 110*  BUN 10 9  CREATININE 1.14 1.20  CALCIUM 8.7* 8.6*    Intake/Output Summary (Last 24 hours) at 02/09/2024 1621 Last data filed at 02/09/2024 1200 Gross per 24 hour  Intake 454 ml  Output 725 ml  Net -271 ml        Physical Exam: Vital Signs Blood pressure 119/70, pulse 69, temperature 98 F (36.7 C), temperature source Oral, resp. rate 16, height 5\' 4"  (1.626 m), weight 70 kg, SpO2 100%.   General: awake, alert, appropriate, supine in bed; wearing EGS; NAD HENT: conjugate gaze; oropharynx moist CV: regular rate and rhythm; no JVD Pulmonary: CTA B/L; no W/R/R- good air movement GI: soft, NT, ND, (+)BS Psychiatric: appropriate- slightly delayed interaction Neurological: Ox3 Skin: Clean and intact without signs of breakdown Neuro:     Mental Status: AAOx3, memory intact, fund of knowledge appropriate Speech/Languate: Naming and repetition intact, moderate dysarthria, follows simple commands   CRANIAL NERVES: II: PERRL. Visual fields full III, IV, VI: EOM intact, no gaze preference or deviation V: normal sensation bilaterally VII: Mild R facial weakness VIII: normal hearing to speech IX, X: normal palatal elevation XI: 4/5 shoulder shrug on the right XII: Tongue deviates to RIght     MOTOR: RUE: 4/5 Deltoid, 4/5 Biceps, 4/5 Triceps,4/5 Grip LUE: 5/5 Deltoid, 5/5 Biceps, 5/5 Triceps, 5/5 Grip RLE: HF 4/5, KE 4+/5, ADF 4+/5, APF  4+/5 LLE: HF 4+/5, KE 5/5, ADF 5/5, APF 5/5     REFLEXES No ankle clonus   SENSORY: Altered sensation to LT RUE  Altered sensation b/l Feet- chronic neuropathy   Coordination: Normal finger to nose ataxia on R   MSK: R shoulder pain with PROM- chronic    Assessment/Plan: 1. Functional deficits which require 3+ hours per day of interdisciplinary therapy in a comprehensive inpatient rehab setting. Physiatrist is providing close team supervision and 24 hour management of active medical problems listed below. Physiatrist and rehab team continue to assess barriers to discharge/monitor patient progress toward functional and medical goals  Care Tool:  Bathing    Body parts bathed by patient: Right arm, Left arm, Chest, Abdomen, Front perineal area, Buttocks, Right upper leg, Face, Left lower leg, Right lower leg, Left upper leg         Bathing assist Assist Level: Contact Guard/Touching assist     Upper Body Dressing/Undressing Upper body dressing   What is the patient wearing?: Pull over shirt    Upper body assist Assist Level: Supervision/Verbal cueing    Lower Body Dressing/Undressing Lower body dressing      What is  the patient wearing?: Underwear/pull up, Pants     Lower body assist Assist for lower body dressing: Contact Guard/Touching assist     Toileting Toileting    Toileting assist Assist for toileting: Contact Guard/Touching assist     Transfers Chair/bed transfer  Transfers assist     Chair/bed transfer assist level: Minimal Assistance - Patient > 75%     Locomotion Ambulation   Ambulation assist      Assist level: Minimal Assistance - Patient > 75% Assistive device:  (hurrycane) Max distance: 200'   Walk 10 feet activity   Assist     Assist level: Minimal Assistance - Patient > 75% Assistive device:  (hurrycane)   Walk 50 feet activity   Assist    Assist level: Minimal Assistance - Patient > 75% Assistive device:   (hurrycane)    Walk 150 feet activity   Assist    Assist level: Minimal Assistance - Patient > 75% Assistive device:  (hurrycane)    Walk 10 feet on uneven surface  activity   Assist     Assist level: Minimal Assistance - Patient > 75%     Wheelchair     Assist Is the patient using a wheelchair?: Yes Type of Wheelchair: Manual    Wheelchair assist level: Dependent - Patient 0% Max wheelchair distance: 150'    Wheelchair 50 feet with 2 turns activity    Assist        Assist Level: Dependent - Patient 0%   Wheelchair 150 feet activity     Assist      Assist Level: Dependent - Patient 0%   Blood pressure 119/70, pulse 69, temperature 98 F (36.7 C), temperature source Oral, resp. rate 16, height 5\' 4"  (1.626 m), weight 70 kg, SpO2 100%.   Medical Problem List and Plan: 1. Functional deficits secondary to acute CVA  left corona radiata, left lentiform nucleus and posterior limb of left internal capsule likely due to large vessel disease             -patient may  shower             -ELOS/Goals: 7-10 days, PT/OT/SLP min A/sup             First day of evaluations, con't CIR 2.  Antithrombotics: -DVT/anticoagulation:  Pharmaceutical: Lovenox             -antiplatelet therapy: DAPT X 3 months followed by Plavix alone.  3. Pain Management: Tylenol prn. Add voltaren gel to right hip and left knee.  4. Mood/Behavior/Sleep: LCSW to follow for evaluation and support.              -antipsychotic agents: N/A 5. Neuropsych/cognition: This patient is capable of making decisions on his own behalf. 6. Skin/Wound Care: Routine pressure relief measures.  7. Fluids/Electrolytes/Nutrition: Monitor I/O. Check CMET IN AM. HTN: Monitor BP TID--on Lotensin.  8. HTN: Monitor BP TID--continue Lisinopri/amlodipine. Avoid hypoperfusion.             3/8- BP slightly elevated, but still in permissive HTN window- will wait to increase meds     02/08/2024    3:14 PM 02/08/2024    11:32 AM 02/08/2024    8:23 AM  Vitals with BMI  Height 5\' 4"       Weight 154 lbs 5 oz      BMI 26.48      Systolic 148 151 213  Diastolic 71 68 67  Pulse 70 66 65  9.  T2DM with neuropathy: Hgb A1C- 8.8. Monitor BS ac/hs and use SSI for elevated BS.              --was on jardiance at home-->will hold due to nectar liquids and concerns for dehydration   3/8- CBGs 105-153- con't regimen CBG (last 3)  Recent Labs (last 2 labs)       Recent Labs    02/07/24 2118 02/08/24 0652 02/08/24 1133  GLUCAP 155* 115* 177*        10. Hypokalemia: Improved after supplement. Recheck in am.  3/8- K+ 3.7 11. Hypomagnesemia: Improved after supplement. Recheck in am. 3/8- Last Mg 1.7- wasn't done this AM- will draw Monday  12. BPH?: On flomax.  13. Melanoma with lung/brain mets: Treated with immunotherapy and Gamma knife. In remission. F/U outpatient [facility] specialist.  14. Hx of chronic calcific pancreatitis: Has been sober for 2 years.  15. Hyperlipidemia: LDL at goal.  Lipitor 20mg  was started.  16. Facial rash.  Suspect this is seborrheic dermatitis. He uses triamcinolone cream at home for this, has not tried any other creams previously.             -Will start ketoconazole cream, triamcinolone cream as needed 17. Hx of GI bleed 2023             -Continue PPI 18. Dysphagia             -Continue SLP   3/8- on Nectar thick liquids- heart healthy diet     LOS: 1 days A FACE TO FACE EVALUATION WAS PERFORMED  Shazia Mitchener 02/09/2024, 4:21 PM

## 2024-02-09 NOTE — Evaluation (Signed)
 Speech Language Pathology Assessment and Plan  Patient Details  Name: Kenneth [name] MRN: [id number] Date of Birth: [birth year]  SLP Diagnosis: Dysarthria;Dysphagia  Rehab Potential: Excellent ELOS: 7-10 days    Today's Date: 02/09/2024 SLP Individual Time: 0900-1000 SLP Individual Time Calculation (min): 60 min   Hospital Problem: Principal Problem:   Stroke (cerebrum) [facility])  Past Medical History:  Past Medical History:  Diagnosis Date   Alcohol dependence (HCC)    Chronic calcific pancreatitis (HCC)    CVA (cerebral vascular accident) (HCC) 01/24/2022   Diabetes mellitus without complication (HCC)    Hyperlipidemia    Hypertension    Melanoma (HCC) 2013   metastatic - resected and cured with chemo/immuno tx   Pancreatic cyst-mass?    Polyneuropathy    Past Surgical History:  Past Surgical History:  Procedure Laterality Date   BIOPSY  02/03/2022   Procedure: BIOPSY;  Surgeon: Iva Boop, MD;  Location: [facility] ENDOSCOPY;  Service: Gastroenterology;;   BRAIN SURGERY  2015   to check for possible malignancy from melanoma, result were scar tissue   COLONOSCOPY WITH PROPOFOL N/A 02/03/2022   Procedure: COLONOSCOPY WITH PROPOFOL;  Surgeon: Iva Boop, MD;  Location: [facility] ENDOSCOPY;  Service: Gastroenterology;  Laterality: N/A;   ESOPHAGOGASTRODUODENOSCOPY (EGD) WITH PROPOFOL N/A 02/03/2022   Procedure: ESOPHAGOGASTRODUODENOSCOPY (EGD) WITH PROPOFOL;  Surgeon: Iva Boop, MD;  Location: [facility] ENDOSCOPY;  Service: Gastroenterology;  Laterality: N/A;   melanoma exicison     right leg    Assessment / Plan / Recommendation Clinical Impression HPI: Kenneth [name] is a right handed male with history of HTN, CVA with residual mild right sided weakness 2023, recent falls due to RLE weakness (referred to outpatient PT), [place], ETOH abuse with pancreatitis, T2DM, polyneuropathy,  melanoma with mets to brain and lungs [facility] 2003)- in remission, who was admitted on 02/04/24 with  reports of right hand numbness with weakness as well as right facial weakness with slurred speech. CTA head/neck showed severe stenosis right PCA P3 segment and severe stenosis at origin of M2 L-MCA  He was found to have two acute infarcts in left corona radiata, left lentiform nucleus and posterior limb left internal capsule, moderate chronic cortical encephalomalacia/gliosis anterior left frontal lobe due to cranioplasty and severe chronic micro hemorrhages felt to be sequale of hypertensive microangiopathy. 2D echo showed EF 60-65% with no wall abnormality, trivial TVR and trivial pericardial effusion.  MBS done revealing moderate oropharyngeal dysphagia with silent aspiration of thins and nectars and chin tuck effective in preventing aspiration of nectars. Dr. Roda Shutters felt that stroke was due to large vessel disease and recommended DAPT X 3 months followed by Plavix alone.  He was started on lipitor 20mg .    Patient continues to be limited by right sided weakness with RUE ataxia, mild right facial droop with dysarthria as well as dysphagia requiring modified diet.  Clinical Impression:  Bedside Swallow Evaluation: A bedside swallow evaluation was completed to assess for s/sx of oropharyngeal dysphagia. Patient with mild R sided oral and facial weakness, though adequate for swallowing and mastication. POs offered included NTL, purees and solids. Patient with timely mastication, adequate oral clearance and no s/sx of aspiration. Recommend regular solids and NTL w/ chin tuck per MBS recommendations. Medications may be administered whole in puree. Patient should receive intermittent supervision for use of compensatory strategies (chin tuck with liquids, small bites/sips, slow rate, sitting upright at 90 degrees and lingual sweep if necessary.) Cognitive-Linguistic: Patient was evaluated via the  Cognistat to assess cognitive-linguistic functioning. Patient scored WFL on all subtests and no deficits in cognition nor  language were observed informally. Patients sister completes all higher level cognitive tasks in the home (meds/finances).  Dysarthria: Patient presents with moderate dysarthria characterized by imprecise articuation and reduced respiratory support. Patient with average sustained phonation time of ~12 seconds and is approximately 70% intelligibile at the conversational level.  Pt would benefit from skilled ST services to maximize communication and dysphagia in order to maximize functional independence at d/c. Anticipate patient will require supervision at d/c and f/u SLP services.    Skilled Therapeutic Interventions          Patient evaluated using a standardized cognitive linguistic assessment and bedside swallow evaluation to assess current cognitive, communicative and swallowing function. See above for details.    SLP Assessment  Patient will need skilled Speech Lanaguage Pathology Services during CIR admission    Recommendations  SLP Diet Recommendations: Age appropriate regular solids;Nectar Liquid Administration via: Cup Medication Administration: Whole meds with puree Supervision: Patient able to self feed;Intermittent supervision to cue for compensatory strategies Compensations: Chin tuck;Slow rate;Small sips/bites Postural Changes and/or Swallow Maneuvers: Seated upright 90 degrees Oral Care Recommendations: Oral care BID Patient destination: Home Follow up Recommendations: Home Health SLP;Outpatient SLP Equipment Recommended: None recommended by SLP    SLP Frequency 3 to 5 out of 7 days   SLP Duration  SLP Intensity  SLP Treatment/Interventions 7-10 days  Minumum of 1-2 x/day, 30 to 90 minutes  Dysphagia/aspiration precaution training;Speech/Language facilitation;Cueing hierarchy;Therapeutic Activities;Functional tasks;Multimodal communication approach;Patient/family education    Pain None reported   SLP Evaluation Cognition Overall Cognitive Status: Within Functional  Limits for tasks assessed Arousal/Alertness: Awake/alert Orientation Level: Oriented X4 Year: 2025 Month: March Day of Week: Correct Memory: Appears intact Awareness: Appears intact Problem Solving: Appears intact  Comprehension Auditory Comprehension Overall Auditory Comprehension: Appears within functional limits for tasks assessed Expression Expression Primary Mode of Expression: Verbal Verbal Expression Overall Verbal Expression: Appears within functional limits for tasks assessed Oral Motor Oral Motor/Sensory Function Overall Oral Motor/Sensory Function: Mild impairment Facial ROM: Reduced right Facial Symmetry: Abnormal symmetry right Facial Strength: Within Functional Limits Facial Sensation: Within Functional Limits Lingual ROM: Within Functional Limits Lingual Symmetry: Within Functional Limits Lingual Strength: Within Functional Limits Motor Speech Overall Motor Speech: Impaired Respiration: Impaired Level of Impairment: Conversation Phonation: Normal Resonance: Within functional limits Articulation: Impaired Level of Impairment: Phrase Intelligibility: Intelligibility reduced Word: 75-100% accurate Phrase: 75-100% accurate Sentence: 75-100% accurate Conversation: 50-74% accurate Motor Planning: Witnin functional limits  Care Tool Care Tool Cognition Ability to hear (with hearing aid or hearing appliances if normally used Ability to hear (with hearing aid or hearing appliances if normally used): 0. Adequate - no difficulty in normal conservation, social interaction, listening to TV   Expression of Ideas and Wants Expression of Ideas and Wants: 3. Some difficulty - exhibits some difficulty with expressing needs and ideas (e.g, some words or finishing thoughts) or speech is not clear   Understanding Verbal and Non-Verbal Content Understanding Verbal and Non-Verbal Content: 4. Understands (complex and basic) - clear comprehension without cues or repetitions   Memory/Recall Ability Memory/Recall Ability : Current season;That he or she is in a hospital/hospital unit   Bedside Swallowing Assessment General Diet Prior to this Study: Regular;Mildly thick liquids (Level 2, nectar thick) Respiratory Status: Room air Behavior/Cognition: Alert;Cooperative;Pleasant mood Oral Cavity - Dentition: Adequate natural dentition Self-Feeding Abilities: Able to feed self Vision: Functional for self-feeding Patient Positioning: Upright  in bed Baseline Vocal Quality: Normal Volitional Cough: Strong Volitional Swallow: Able to elicit  Ice Chips Ice chips: Not tested Thin Liquid Thin Liquid: Not tested Nectar Thick Nectar Thick Liquid: Within functional limits Presentation: Self Fed;Cup Honey Thick Honey Thick Liquid: Not tested Puree Puree: Within functional limits Presentation: Self Fed;Spoon Solid Solid: Within functional limits Presentation: Self Fed BSE Assessment Risk for Aspiration Impact on safety and function: Mild aspiration risk Other Related Risk Factors: Previous CVA  Short Term Goals: Week 1: SLP Short Term Goal 1 (Week 1): STG = LTG due to ELOS  Refer to Care Plan for Long Term Goals  Recommendations for other services: None   Discharge Criteria: Patient will be discharged from SLP if patient refuses treatment 3 consecutive times without medical reason, if treatment goals not met, if there is a change in medical status, if patient makes no progress towards goals or if patient is discharged from hospital.  The above assessment, treatment plan, treatment alternatives and goals were discussed and mutually agreed upon: by patient  Jerrine Urschel M.A., CF-SLP 02/09/2024, 12:12 PM

## 2024-02-09 NOTE — Progress Notes (Signed)
 Inpatient Rehabilitation Admission Medication Review by a Pharmacist  A complete drug regimen review was completed for this patient to identify any potential clinically significant medication issues.  High Risk Drug Classes Is patient taking? Indication by Medication  Antipsychotic Yes, as an intravenous medication Compazine - nausea  Anticoagulant Yes Enoxaparin - vte ppx  Antibiotic No   Opioid No   Antiplatelet Yes Aspirin, plavix DAPT X 3 months followed by Plavix alone - CVA  Hypoglycemics/insulin Yes Insulin aspart SSI - T2DM  Vasoactive Medication Yes Amlodipine, benazepril-HTN  Chemotherapy No   Other Yes Acetaminophen , diclofenac gel- pain Atrovastatin - HLD Ketoconazole cream triamcinolone cream  - facial rash Mirtazapine-helps nausea anorexia and insomnia    Protonix - h/o GI bleed 2023 Tamsulosion- urinary retention Trazodone -sleep  Bisacodyl, fleets enema - prn constipation     Type of Medication Issue Identified Description of Issue Recommendation(s)  Drug Interaction(s) (clinically significant)     Duplicate Therapy     Allergy     No Medication Administration End Date     Incorrect Dose     Additional Drug Therapy Needed     Significant med changes from prior encounter (inform family/care partners about these prior to discharge). PTA medications: and Lopressor were discontinued on acute care discharge orders.   Jardiance, Actos, Sitagliptin,  lactobacillus acidophilus , Klor -con, senokot-S were  continued on acute care discharge orders but not resumed on CIR admission.   Glipizide was discontinued on acute care discharge orders.   New Coreg prescribed on discharge from acute care.  Not ordered on CIR admisstion. Restart PTA meds when and if necessary during CIR admission or at time of discharge, if warranted.         [name] was reported as NOT taking prior to the acute care admisstion.  Discontinued on discharge from admit.   Communicate to  patient /family/ caregiver  medication changes prior to discharge.    Other       Clinically significant medication issues were identified that warrant physician communication and completion of prescribed/recommended actions by midnight of the next day:  No  Name of provider notified for urgent issues identified:   Provider Method of Notification:     Pharmacist comments:   Time spent performing this drug regimen review (minutes): 30   Kenneth [name], [place] Clinical Pharmacist 02/09/2024 2:00 PM

## 2024-02-10 DIAGNOSIS — I639 Cerebral infarction, unspecified: Secondary | ICD-10-CM | POA: Diagnosis not present

## 2024-02-10 LAB — GLUCOSE, CAPILLARY
Glucose-Capillary: 72 mg/dL (ref 70–99)
Glucose-Capillary: 200 mg/dL — ABNORMAL HIGH (ref 70–99)
Glucose-Capillary: 110 mg/dL — ABNORMAL HIGH (ref 70–99)
Glucose-Capillary: 131 mg/dL — ABNORMAL HIGH (ref 70–99)
Glucose-Capillary: 128 mg/dL — ABNORMAL HIGH (ref 70–99)

## 2024-02-10 NOTE — Progress Notes (Signed)
 Physical Therapy Session Note  Patient Details  Name: Kenneth [name] MRN: [id number] Date of Birth: Apr 07, 1961  Today's Date: 02/10/2024 PT Individual Time: 1015-1110 PT Individual Time Calculation (min): 55 min   Short Term Goals: Week 1:  PT Short Term Goal 1 (Week 1): STGs = LTGs  Skilled Therapeutic Interventions/Progress Updates:    Chart reviewed and pt agreeable to therapy. Pt received seated in recliner with no c/o pain. Session focused on ambulation and dynamic balance to promote safe home mobility. Pt initiated session with donning shoes with S. Pt then completed amb of 216ft using CGA + hurrycane to gym. Pt then completed amb of 370ft using CGA + hurrycane periodically fading to close S + hurrycane. Pt required increase to minA during stops and direction changes. Pt then practiced pace changes and start/stop both with dual tasking exercies of word naming during amb for 234ft + 258ft.  Pt required CGA t/o exercises. Pt then completed reaches outside BOS to sort golf alls into various cups on a desk. Pt required CGA t/o exercise. Pt then amb 264ft to return to room using close S + hurrycane. At end of session, pt was left seated in recliner with alarm engaged, nurse call bell and all needs in reach.     Therapy Documentation Precautions:  Precautions Precautions: Fall Recall of Precautions/Restrictions: Intact Restrictions Weight Bearing Restrictions Per Provider Order: No General:      Therapy/Group: Individual Therapy  Dionne Milo, PT, DPT 02/10/2024, 11:15 AM

## 2024-02-10 NOTE — Progress Notes (Addendum)
 Speech Language Pathology Daily Session Note  Patient Details  Name: Kenneth [name] MRN: [id number] Date of Birth: Feb 27, 1961  Today's Date: 02/10/2024 SLP Individual Time: 1300-1400 SLP Individual Time Calculation (min): 60 min  Short Term Goals: Week 1: SLP Short Term Goal 1 (Week 1): STG = LTG due to ELOS  Skilled Therapeutic Interventions:  Pt in chair with sister present upon SLP arrival. Denied pain. SLP facilitated therapy targeting dysphagia and speech. Pt reports no overt difficulty with NTL, states he is using chin tuck with NTL but this was not observed during the session independently. Provided education on silent vs overt aspiration. Completed oral care prior to trials of thin liquid ice chips and teaspoons of water. No overt s/sx of aspiration but per ST eval 02/09/24, pt silently aspirating thins. Discussed that any trials with thin would be with ST only; pt and family verbalized understanding.   SLP unable to view MBSS procedure report in Epic and therefore, provided general swallowing exercises and discussed that primary SLP may adjust based on deficits noted on MBSS report. Pt and family verbalized understanding. Provided education and model of effortful swallow, CTAR, and Masako. Pt completed effortful swallow x20 with perceived difficulty rating of 3/10, CTAR x30 rating difficulty of 5/10, and Masako x10 with 6/10 difficulty. SLP planned to provide EMST device; however, due to time constraints was unable to complete testing of baseline MEP. Provided education on purpose of EMST and confirmed pt does not have any known contraindications.   Speech intelligibility was estimated to be 70-75% at the conversational level. Pt independently reports using slow rate but SLP discussed SLOP strategy. Pt responded well to min verbal cues for overarticulation during session and was able use them when cued.   Pt left in chair with alarm set and call bell in reach with family present. Rec provide  EMST next session and continue POC.   Pain Pain Assessment Pain Scale: 0-10 Pain Score: 0-No pain  Therapy/Group: Individual Therapy  Alphonsus Sias 02/10/2024, 3:43 PM

## 2024-02-10 NOTE — Progress Notes (Signed)
 Occupational Therapy Session Note  Patient Details  Name: Kenneth [name] MRN: [id number] Date of Birth: [birth year]  Today's Date: 02/10/2024 OT Individual Time: 3295-1884 OT Individual Time Calculation (min): 76 min    Short Term Goals: Week 1:  OT Short Term Goal 1 (Week 1): Pt will demonstrate improved RLE coordination to ambulate to bathroom with LRAD with supervision. OT Short Term Goal 2 (Week 1): Pt will demonstrate improved R FMC to open lunch tray items and cut his food independently.  Skilled Therapeutic Interventions/Progress Updates:      Therapy Documentation Precautions:  Precautions Precautions: Fall Recall of Precautions/Restrictions: Intact Restrictions Weight Bearing Restrictions Per Provider Order: No General: "Hi there!" Pt supine in bed upon OT arrival, agreeable to OT session. Pt medicated during session.  Pain: no pain reported  ADL: Pt declined ADL tasks this AM d/t already completing. Pt rose supine>EOB with SBA with reported dizziness, resolving in >1 min. Pt then completed sit to stand and bed>W/C transfer with RW at [facility] with no LOB or dizziness reported. Pt reports trouble with manipulating feeding utensils. OT issued pt red tube grip with dycem in order to increase grip for feeding.   Exercises: Pt recieved foam block hand home exercise program in order to increase strength/ROM of Rt hand. Pt educated on purpose of exercises and reps/sets to complete. Pt given pink and blue foam block and instructed on the following exercises:  -power grip -key pinch -pincer grasp -three finger pinch -thumb flexion -PIP grip   Pt also issued pink theraputty with beads inside in order to increase [facility] and strength of intrinsic muscles of Rt hand. Pt shown various exercises in order to increase strength and FMC of RUE in order to increase independence with ADLs. Pt also participating in reaching out of BOS with RUE in order to place beads in container. Pt able to reach  SBA with good trunk support.  Other Treatments: Pt completed standardized 9HPT in order to compare RUE/LUE deficits and increase FMC of RUE. Pt completed test with the following scores:  -RUE: 2:50 -LUE: 31 seconds Pt RUE not WNL for age group, will continue to increase [facility] and assess throughout IPR stay.   Pt completed W/C>recliner with RW at [facility] and VC for safe transfer techniques. Pt seated in recliner at end of session with W/C alarm donned, call light within reach and 4Ps assessed.    Therapy/Group: Individual Therapy  Velia Meyer, OTD, OTR/L 02/10/2024, 9:19 AM

## 2024-02-10 NOTE — Progress Notes (Signed)
 PROGRESS NOTE   Subjective/Complaints: Pt reports good=  LBM yesterday No pain- got breakfast   ROS:   Pt denies SOB, abd pain, CP, N/V/C/D, and vision changes    Objective:   No results found. Recent Labs    02/09/24 0636  WBC 6.0  HGB 13.0  HCT 38.0*  PLT 254   Recent Labs    02/08/24 1634 02/09/24 0636  NA 138 140  K 3.8 3.7  CL 107 108  CO2 24 26  GLUCOSE 180* 110*  BUN 10 9  CREATININE 1.14 1.20  CALCIUM 8.7* 8.6*    Intake/Output Summary (Last 24 hours) at 02/10/2024 1046 Last data filed at 02/10/2024 0800 Gross per 24 hour  Intake 415 ml  Output 1200 ml  Net -785 ml        Physical Exam: Vital Signs Blood pressure (!) 145/78, pulse (!) 57, temperature 97.7 F (36.5 C), temperature source Oral, resp. rate 18, height 5\' 4"  (1.626 m), weight 70 kg, SpO2 98%.     General: awake, alert, appropriate, sitting up in bed; NAD HENT: conjugate gaze; oropharynx moist CV: regular rate; no JVD Pulmonary: CTA B/L; no W/R/R- good air movement GI: soft, NT, ND, (+)BS Psychiatric: appropriate- more interactive Neurological: Ox3  Skin: Clean and intact without signs of breakdown Neuro:     Mental Status: AAOx3, memory intact, fund of knowledge appropriate Speech/Languate: Naming and repetition intact, moderate dysarthria, follows simple commands   CRANIAL NERVES: II: PERRL. Visual fields full III, IV, VI: EOM intact, no gaze preference or deviation V: normal sensation bilaterally VII: Mild R facial weakness VIII: normal hearing to speech IX, X: normal palatal elevation XI: 4/5 shoulder shrug on the right XII: Tongue deviates to RIght     MOTOR: RUE: 4/5 Deltoid, 4/5 Biceps, 4/5 Triceps,4/5 Grip LUE: 5/5 Deltoid, 5/5 Biceps, 5/5 Triceps, 5/5 Grip RLE: HF 4/5, KE 4+/5, ADF 4+/5, APF 4+/5 LLE: HF 4+/5, KE 5/5, ADF 5/5, APF 5/5     REFLEXES No ankle clonus   SENSORY: Altered sensation to  LT RUE  Altered sensation b/l Feet- chronic neuropathy   Coordination: Normal finger to nose ataxia on R   MSK: R shoulder pain with PROM- chronic    Assessment/Plan: 1. Functional deficits which require 3+ hours per day of interdisciplinary therapy in a comprehensive inpatient rehab setting. Physiatrist is providing close team supervision and 24 hour management of active medical problems listed below. Physiatrist and rehab team continue to assess barriers to discharge/monitor patient progress toward functional and medical goals  Care Tool:  Bathing    Body parts bathed by patient: Right arm, Left arm, Chest, Abdomen, Front perineal area, Buttocks, Right upper leg, Face, Left lower leg, Right lower leg, Left upper leg         Bathing assist Assist Level: Contact Guard/Touching assist     Upper Body Dressing/Undressing Upper body dressing   What is the patient wearing?: Pull over shirt    Upper body assist Assist Level: Supervision/Verbal cueing    Lower Body Dressing/Undressing Lower body dressing      What is the patient wearing?: Underwear/pull up, Pants     Lower body  assist Assist for lower body dressing: Contact Guard/Touching assist     Toileting Toileting    Toileting assist Assist for toileting: Contact Guard/Touching assist     Transfers Chair/bed transfer  Transfers assist     Chair/bed transfer assist level: Minimal Assistance - Patient > 75%     Locomotion Ambulation   Ambulation assist      Assist level: Minimal Assistance - Patient > 75% Assistive device:  (hurrycane) Max distance: 200'   Walk 10 feet activity   Assist     Assist level: Minimal Assistance - Patient > 75% Assistive device:  (hurrycane)   Walk 50 feet activity   Assist    Assist level: Minimal Assistance - Patient > 75% Assistive device:  (hurrycane)    Walk 150 feet activity   Assist    Assist level: Minimal Assistance - Patient > 75% Assistive  device:  (hurrycane)    Walk 10 feet on uneven surface  activity   Assist     Assist level: Minimal Assistance - Patient > 75%     Wheelchair     Assist Is the patient using a wheelchair?: Yes Type of Wheelchair: Manual    Wheelchair assist level: Dependent - Patient 0% Max wheelchair distance: 150'    Wheelchair 50 feet with 2 turns activity    Assist        Assist Level: Dependent - Patient 0%   Wheelchair 150 feet activity     Assist      Assist Level: Dependent - Patient 0%   Blood pressure (!) 145/78, pulse (!) 57, temperature 97.7 F (36.5 C), temperature source Oral, resp. rate 18, height 5\' 4"  (1.626 m), weight 70 kg, SpO2 98%.   Medical Problem List and Plan: 1. Functional deficits secondary to acute CVA  left corona radiata, left lentiform nucleus and posterior limb of left internal capsule likely due to large vessel disease             -patient may  shower             -ELOS/Goals: 7-10 days, PT/OT/SLP min A/sup             Con't CIR PT and OT 2.  Antithrombotics: -DVT/anticoagulation:  Pharmaceutical: Lovenox             -antiplatelet therapy: DAPT X 3 months followed by Plavix alone.  3. Pain Management: Tylenol prn. Add voltaren gel to right hip and left knee.  4. Mood/Behavior/Sleep: LCSW to follow for evaluation and support.              -antipsychotic agents: N/A 5. Neuropsych/cognition: This patient is capable of making decisions on his own behalf. 6. Skin/Wound Care: Routine pressure relief measures.  7. Fluids/Electrolytes/Nutrition: Monitor I/O. Check CMET IN AM. HTN: Monitor BP TID--on Lotensin.  8. HTN: Monitor BP TID--continue Lisinopri/amlodipine. Avoid hypoperfusion.             3/8- BP slightly elevated, but still in permissive HTN window- will wait to increase meds  3/9- BP controlled except for AM before meds- is 145 systolic- con't regimen   9.  T2DM with neuropathy: Hgb A1C- 8.8. Monitor BS ac/hs and use SSI for  elevated BS.              --was on jardiance at home-->will hold due to nectar liquids and concerns for dehydration   3/8-3/9 CBGs 105-155- con't regimen CBG (last 3)  Recent Labs (last 2 labs)  Recent Labs    02/07/24 2118 02/08/24 0652 02/08/24 1133  GLUCAP 155* 115* 177*        10. Hypokalemia: Improved after supplement. Recheck in am.  3/8- K+ 3.7 11. Hypomagnesemia: Improved after supplement. Recheck in am. 3/8- Last Mg 1.7- wasn't done this AM- will draw Monday  12. BPH?: On flomax.  13. Melanoma with lung/brain mets: Treated with immunotherapy and Gamma knife. In remission. F/U outpatient [facility] specialist.  14. Hx of chronic calcific pancreatitis: Has been sober for 2 years.  15. Hyperlipidemia: LDL at goal.  Lipitor 20mg  was started.  16. Facial rash.  Suspect this is seborrheic dermatitis. He uses triamcinolone cream at home for this, has not tried any other creams previously.             -Will start ketoconazole cream, triamcinolone cream as needed 17. Hx of GI bleed 2023             -Continue PPI 18. Dysphagia             -Continue SLP   3/8- on Nectar thick liquids- heart healthy diet     LOS: 2 days A FACE TO FACE EVALUATION WAS PERFORMED  Kenneth [name] 02/10/2024, 10:46 AM

## 2024-02-11 DIAGNOSIS — I639 Cerebral infarction, unspecified: Secondary | ICD-10-CM | POA: Diagnosis not present

## 2024-02-11 LAB — GLUCOSE, CAPILLARY
Glucose-Capillary: 95 mg/dL (ref 70–99)
Glucose-Capillary: 108 mg/dL — ABNORMAL HIGH (ref 70–99)
Glucose-Capillary: 132 mg/dL — ABNORMAL HIGH (ref 70–99)
Glucose-Capillary: 127 mg/dL — ABNORMAL HIGH (ref 70–99)

## 2024-02-11 LAB — MAGNESIUM: Magnesium: 1.6 mg/dL — ABNORMAL LOW (ref 1.7–2.4)

## 2024-02-11 LAB — CBC
HCT: 36.7 % — ABNORMAL LOW (ref 39.0–52.0)
Hemoglobin: 12.4 g/dL — ABNORMAL LOW (ref 13.0–17.0)
MCH: 29.4 pg (ref 26.0–34.0)
MCHC: 33.8 g/dL (ref 30.0–36.0)
MCV: 87 fL (ref 80.0–100.0)
Platelets: 264 10*3/uL (ref 150–400)
RBC: 4.22 MIL/uL (ref 4.22–5.81)
RDW: 13.8 % (ref 11.5–15.5)
WBC: 5.3 10*3/uL (ref 4.0–10.5)
nRBC: 0 % (ref 0.0–0.2)

## 2024-02-11 MED ORDER — L-METHYLFOLATE-B6-B12 3-35-2 MG PO TABS
1.0000 | ORAL_TABLET | Freq: Every day | ORAL | Status: DC
  Administered 2024-02-11 – 2024-02-15 (×5): 1 via ORAL
  Filled 2024-02-11 (×5): qty 1

## 2024-02-11 MED ORDER — POTASSIUM CHLORIDE 20 MEQ PO PACK
20.0000 meq | PACK | Freq: Once | ORAL | Status: AC
  Administered 2024-02-11: 20 meq via ORAL
  Filled 2024-02-11: qty 1

## 2024-02-11 MED ORDER — B COMPLEX-C PO TABS
1.0000 | ORAL_TABLET | Freq: Every day | ORAL | Status: DC
  Administered 2024-02-11 – 2024-02-15 (×5): 1 via ORAL
  Filled 2024-02-11 (×5): qty 1

## 2024-02-11 MED ORDER — MAGNESIUM GLUCONATE 500 MG PO TABS
250.0000 mg | ORAL_TABLET | Freq: Every day | ORAL | Status: DC
  Administered 2024-02-11 – 2024-02-15 (×5): 250 mg via ORAL
  Filled 2024-02-11 (×5): qty 1

## 2024-02-11 MED ORDER — VITAMIN D 25 MCG (1000 UNIT) PO TABS
1000.0000 [IU] | ORAL_TABLET | Freq: Every day | ORAL | Status: DC
  Administered 2024-02-11: 1000 [IU] via ORAL
  Filled 2024-02-11: qty 1

## 2024-02-11 MED ORDER — VITAMIN D 25 MCG (1000 UNIT) PO TABS
2000.0000 [IU] | ORAL_TABLET | Freq: Every day | ORAL | Status: DC
  Administered 2024-02-12 – 2024-02-14 (×3): 2000 [IU] via ORAL
  Filled 2024-02-11 (×3): qty 2

## 2024-02-11 NOTE — Progress Notes (Signed)
 Inpatient Rehabilitation Care Coordinator Assessment and Plan Patient Details  Name: Kenneth [name] MRN: [id number] Date of Birth: Feb 20, 1961  Today's Date: 02/11/2024  Hospital Problems: Principal Problem:   Stroke (cerebrum) [facility])  Past Medical History:  Past Medical History:  Diagnosis Date   Alcohol dependence (HCC)    Chronic calcific pancreatitis (HCC)    CVA (cerebral vascular accident) (HCC) 01/24/2022   Diabetes mellitus without complication (HCC)    Hyperlipidemia    Hypertension    Melanoma (HCC) 2013   metastatic - resected and cured with chemo/immuno tx   Pancreatic cyst-mass?    Polyneuropathy    Past Surgical History:  Past Surgical History:  Procedure Laterality Date   BIOPSY  02/03/2022   Procedure: BIOPSY;  Surgeon: Iva Boop, MD;  Location: [facility] ENDOSCOPY;  Service: Gastroenterology;;   BRAIN SURGERY  2015   to check for possible malignancy from melanoma, result were scar tissue   COLONOSCOPY WITH PROPOFOL N/A 02/03/2022   Procedure: COLONOSCOPY WITH PROPOFOL;  Surgeon: Iva Boop, MD;  Location: [facility] ENDOSCOPY;  Service: Gastroenterology;  Laterality: N/A;   ESOPHAGOGASTRODUODENOSCOPY (EGD) WITH PROPOFOL N/A 02/03/2022   Procedure: ESOPHAGOGASTRODUODENOSCOPY (EGD) WITH PROPOFOL;  Surgeon: Iva Boop, MD;  Location: [facility] ENDOSCOPY;  Service: Gastroenterology;  Laterality: N/A;   melanoma exicison     right leg   Social History:  reports that he has never smoked. He has quit using smokeless tobacco.  His smokeless tobacco use included snuff. He reports that he does not currently use alcohol. He reports that he does not use drugs.  Family / Support Systems Marital Status: Single Patient Roles: Other (Comment) (sibling) Other Supports: [name] [phone] sister whom he lives with  Florence-sister [phone] lives close by Anticipated Caregiver: Self and sister works three days out of the home and two days in the home. Other sister-Florence can  come by and check on Ability/Limitations of Caregiver: Will be alone at times will need to be mod/i Caregiver Availability: Other (Comment) (will be times alone) Family Dynamics: Close knit fmaily who have pulled together to provide to their brother-pt. Pt wants to do what he can to be independent and not burden his sisters  Social History Preferred language: English Religion: Catholic Cultural Background: NA Education: HS Health Literacy - How often do you need to have someone help you when you read instructions, pamphlets, or other written material from your doctor or pharmacy?: Never Writes: Yes Employment Status: Disabled Marine scientist Issues: NA Guardian/Conservator: None-according to MD pt is capable of making his own decisons while here   Abuse/Neglect Abuse/Neglect Assessment Can Be Completed: Yes Physical Abuse: Denies Verbal Abuse: Denies Sexual Abuse: Denies Exploitation of patient/patient's resources: Denies Self-Neglect: Denies  Patient response to: Social Isolation - How often do you feel lonely or isolated from those around you?: Never  Emotional Status Pt's affect, behavior and adjustment status: Pt has been mod/i prior to this, with his previous CVA he did well and hopeful he will again. He wants to do well this time and get back home soon. Recent Psychosocial Issues: other health issues-previous CVA Psychiatric History: No history seems to be coping appropriately and doing well. He is improving and this is encouraging him. Substance Abuse History: HX-ETOH-issues as result-has quit and has no other issues  Patient / Family Perceptions, Expectations & Goals Pt/Family understanding of illness & functional limitations: Pt is able to explain his stroke and his deficits, he is making progress and is improving which is encouraging  to him. He wants to work on his speech and his arm. Premorbid pt/family roles/activities: brother, disabled, neighbor,  etc Anticipated changes in roles/activities/participation: resume Pt/family expectations/goals: Pt states: " I want to be able to do for myself like I got to before."  [employer]: Other (Comment) (was at [facility] 2023) Premorbid Home Care/DME Agencies: Other (Comment) (has rw, cane, wc, shower chair-may need bsc) Transportation available at discharge: sister's Is the patient able to respond to transportation needs?: Yes In the past 12 months, has lack of transportation kept you from medical appointments or from getting medications?: No In the past 12 months, has lack of transportation kept you from meetings, work, or from getting things needed for daily living?: No  Discharge Planning Living Arrangements: Other relatives Support Systems: Other relatives, Friends/neighbors Type of Residence: Private residence Insurance Resources: Media planner (specify) ([employer] Community medicaid) Surveyor, quantity Resources: Family Support, SSI Financial Screen Referred: No Living Expenses: Lives with family Money Management: Family Does the patient have any problems obtaining your medications?: No Home Management: both he and sister Patient/Family Preliminary Plans: Return home with sister who does work out of the home 3 days per [employer] from home for 2 days. Has another sister who can check on. Wants to be mod/i level Care Coordinator Barriers to Discharge: Insurance for SNF coverage Care Coordinator Anticipated Follow Up Needs: HH/OP  Clinical Impression Pleasant gentleman who is motivated to do well this is his second stroke and he is making progress which is encouraging to him. Hopefully can reach mod/I since his sister works three days out of the home. Will update once team conference on Wed.  Lucy Chris 02/11/2024, 10:58 AM

## 2024-02-11 NOTE — Progress Notes (Signed)
 Patient ID: Kenneth [name], male   DOB: 09/10/1961, 63 y.o.   MRN: [id number] Met with the patient to review current medical situation, rehab process, team conference and plan of care. Discussed secondary risk management including HTN, HLD (LDL 41/Trig 48) on Lipitor and DME (A1C 8.8) on Actos and Jardiance added. Discussed nutritional modifications for CMM diet. Also reviewed DAPT x 3 months (ASA/Plavix) then Plavix solo. Patient reportedly used a cane in the community PTA but able to manage solo. Continue to follow along to address educational needs to facilitate preparation for discharge. Pamelia Hoit

## 2024-02-11 NOTE — Plan of Care (Signed)
  Problem: Consults Goal: RH STROKE PATIENT EDUCATION Description: See Patient Education module for education specifics  Outcome: Progressing   Problem: RH BLADDER ELIMINATION Goal: RH STG MANAGE BLADDER WITH ASSISTANCE Description: STG Manage Bladder With toileting Assistance Outcome: Progressing Goal: RH STG MANAGE BLADDER WITH MEDICATION WITH ASSISTANCE Description: STG Manage Bladder With Medication With mod I Assistance. Outcome: Progressing   Problem: RH SKIN INTEGRITY Goal: RH STG SKIN FREE OF INFECTION/BREAKDOWN Description: Manage skin with cues/reminders assistance Outcome: Progressing Goal: RH STG MAINTAIN SKIN INTEGRITY WITH ASSISTANCE Description: STG Maintain Skin Integrity With min Assistance. Outcome: Progressing   Problem: RH SAFETY Goal: RH STG ADHERE TO SAFETY PRECAUTIONS W/ASSISTANCE/DEVICE Description: STG Adhere to Safety Precautions With cues  Assistance/Device. Outcome: Progressing   Problem: RH PAIN MANAGEMENT Goal: RH STG PAIN MANAGED AT OR BELOW PT'S PAIN GOAL Description: < 4 with prns Outcome: Progressing   Problem: RH KNOWLEDGE DEFICIT Goal: RH STG INCREASE KNOWLEDGE OF DIABETES Description: Patient and sister will be able to manage DM using educational resources for medications and dietary modification independently Outcome: Progressing Goal: RH STG INCREASE KNOWLEDGE OF HYPERTENSION Description: Patient and sister will be able to manage HTN using educational resources for medications and dietary modification independently Outcome: Progressing Goal: RH STG INCREASE KNOWLEGDE OF HYPERLIPIDEMIA Description: Patient and sister will be able to manage HLD using educational resources for medications and dietary modification independently Outcome: Progressing Goal: RH STG INCREASE KNOWLEDGE OF STROKE PROPHYLAXIS Description: Patient and sister will be able to manage secondary stroke risks using educational resources for medications and dietary  modification independently Outcome: Progressing   Problem: Education: Goal: Ability to describe self-care measures that may prevent or decrease complications (Diabetes Survival Skills Education) will improve Outcome: Progressing Goal: Individualized Educational Video(s) Outcome: Progressing   Problem: Coping: Goal: Ability to adjust to condition or change in health will improve Outcome: Progressing   Problem: Fluid Volume: Goal: Ability to maintain a balanced intake and output will improve Outcome: Progressing   Problem: Health Behavior/Discharge Planning: Goal: Ability to identify and utilize available resources and services will improve Outcome: Progressing Goal: Ability to manage health-related needs will improve Outcome: Progressing   Problem: Metabolic: Goal: Ability to maintain appropriate glucose levels will improve Outcome: Progressing   Problem: Nutritional: Goal: Maintenance of adequate nutrition will improve Outcome: Progressing Goal: Progress toward achieving an optimal weight will improve Outcome: Progressing   Problem: Skin Integrity: Goal: Risk for impaired skin integrity will decrease Outcome: Progressing   Problem: Tissue Perfusion: Goal: Adequacy of tissue perfusion will improve Outcome: Progressing

## 2024-02-11 NOTE — Progress Notes (Signed)
 Occupational Therapy Session Note  Patient Details  Name: Kenneth [name] MRN: [id number] Date of Birth: 27-Mar-1961  Today's Date: 02/11/2024 OT Individual Time: 0900-1013 OT Individual Time Calculation (min): 73 min    Short Term Goals: Week 1:  OT Short Term Goal 1 (Week 1): Pt will demonstrate improved RLE coordination to ambulate to bathroom with LRAD with supervision. OT Short Term Goal 2 (Week 1): Pt will demonstrate improved R FMC to open lunch tray items and cut his food independently.  Skilled Therapeutic Interventions/Progress Updates: Patient seen for am ADL's. Patient received resting in bed. Motivated to get in the shower. Patient ambulated to the toilet CGA, SBA for toileting. CGA to ambulate to the shower bench. CGA for bathing. Patient able to ambulate back to the bed to get dressed with set up assist. Stood at the sink for grooming- CGA. Patient with good tolerance throughout ADL's. Reports feeling near his PLOF since his first stroke. Does well utilizing his RUE for basic tasks, but states he was writing proficiently prior to this current onset. Patient given FM exercises and tools to work on hand writing in the room. Good motivation to return to PLOF. Continue with skilled OT POC to improve safety with ADL performance and coordination in the RUE.     Therapy Documentation Precautions:  Precautions Precautions: Fall Recall of Precautions/Restrictions: Intact Restrictions Weight Bearing Restrictions Per Provider Order: No General:   Vital Signs: Therapy Vitals Temp: 97.7 F (36.5 C) Temp Source: Oral Pulse Rate: (!) 56 Resp: 18 BP: 134/73 Patient Position (if appropriate): Lying Oxygen Therapy SpO2: 98 % O2 Device: Room Air Pain: No c/o      Therapy/Group: Individual Therapy  Warnell Forester 02/11/2024, 12:07 PM

## 2024-02-11 NOTE — Discharge Instructions (Signed)
 Inpatient Rehab Discharge Instructions  Kenneth [name] Discharge date and time:  02/16/24  Activities/Precautions/ Functional Status: Activity: no lifting, driving, or strenuous exercise  till cleared by MD Diet: Diabetic diet--liquids have to be nectar thick. Medications need to be taken with puree.  Wound Care: as directed   Functional status:  ___ No restrictions     ___ Walk up steps independently ___ 24/7 supervision/assistance   ___ Walk up steps with assistance _X__ Intermittent supervision/assistance  _X__ Bathe/dress independently _X__ Walk with walker    ___ Bathe/dress with assistance ___ Walk Independently    ___ Shower independently ___ Walk with supervision.     _X__ Shower with assistance _X__ No alcohol     ___ Return to work/school ________   Special Instructions: Water protocol--no water with meals. Need to wait 45-60 minutes after you eat. Brush/oral care then can have water. No food with water.    COMMUNITY REFERRALS UPON DISCHARGE:     Outpatient: PT  OT   SP             Agency:[facility] [id number]  [place] [address] Phone:[phone]              Appointment Date/Time:WILL CALL TO SET UP FOLLOW UP APPOINTMENTS  Medical Equipment/Items Ordered:SISTER BOUGHT THE NEEDED EQUIPMENT                                                 Agency/Supplier:NA     STROKE/TIA DISCHARGE INSTRUCTIONS SMOKING Cigarette smoking nearly doubles your risk of having a stroke & is the single most alterable risk factor  If you smoke or have smoked in the last 12 months, you are advised to quit smoking for your health. Most of the excess cardiovascular risk related to smoking disappears within a year of stopping. Ask you doctor about anti-smoking medications [facility] Quit Line: 1-800-QUIT NOW Free Smoking Cessation Classes [phone]  CHOLESTEROL Know your levels; limit fat & cholesterol in your diet  Lipid Panel     Component Value Date/Time   CHOL 90  02/05/2024 0447   TRIG 48 02/05/2024 0447   HDL 39 (L) 02/05/2024 0447   CHOLHDL 2.3 02/05/2024 0447   VLDL 10 02/05/2024 0447   LDLCALC 41 02/05/2024 0447   LDLCALC 44 11/11/2021 1600     Many patients benefit from treatment even if their cholesterol is at goal. Goal: Total Cholesterol (CHOL) less than 160 Goal:  Triglycerides (TRIG) less than 150 Goal:  HDL greater than 40 Goal:  LDL (LDLCALC) less than 100   BLOOD PRESSURE American Stroke Association blood pressure target is less that 120/80 mm/Hg  Your discharge blood pressure is:  BP: (!) 149/69 Monitor your blood pressure Limit your salt and alcohol intake Many individuals will require more than one medication for high blood pressure  DIABETES (A1c is a blood sugar average for last 3 months) Goal HGBA1c is under 7% (HBGA1c is blood sugar average for last 3 months)  Diabetes:     Lab Results  Component Value Date   HGBA1C 8.8 (H) 02/05/2024    Your HGBA1c can be lowered with medications, healthy diet, and exercise. Check your blood sugar as directed by your physician Call your physician if you experience unexplained or low blood sugars.  PHYSICAL ACTIVITY/REHABILITATION Goal is 30 minutes at least 4  days per week  Activity: Increase activity slowly, and No driving, Therapies:see above Return to work: N/A Activity decreases your risk of heart attack and stroke and makes your heart stronger.  It helps control your weight and blood pressure; helps you relax and can improve your mood. Participate in a regular exercise program. Talk with your doctor about the best form of exercise for you (dancing, walking, swimming, cycling).  DIET/WEIGHT Goal is to maintain a healthy weight  Your discharge diet is:  Diet Order             Diet Carb Modified Fluid consistency: Nectar Thick; Room service appropriate? Yes  Diet effective now                   liquids Your height is:  Height: 5\' 4"  (162.6 cm) Your current weight is:  Weight: 70 kg Your Body Mass Index (BMI) is:  BMI (Calculated): 26.48 Following the type of diet specifically designed for you will help prevent another stroke. Your goal weight is:   Your goal Body Mass Index (BMI) is 19-24. Healthy food habits can help reduce 3 risk factors for stroke:  High cholesterol, hypertension, and excess weight.  RESOURCES Stroke/Support Group:  Call [phone]   STROKE EDUCATION PROVIDED/REVIEWED AND GIVEN TO PATIENT Stroke warning signs and symptoms How to activate emergency medical system (call 911). Medications prescribed at discharge. Need for follow-up after discharge. Personal risk factors for stroke. Pneumonia vaccine given:  Flu vaccine given:  My questions have been answered, the writing is legible, and I understand these instructions.  I will adhere to these goals & educational materials that have been provided to me after my discharge from the hospital.     My questions have been answered and I understand these instructions. I will adhere to these goals and the provided educational materials after my discharge from the hospital.  Patient/Caregiver Signature _______________________________ Date __________  Clinician Signature _______________________________________ Date __________  Please bring this form and your medication list with you to all your follow-up doctor's appointments.

## 2024-02-11 NOTE — Progress Notes (Signed)
 Inpatient Rehabilitation  Patient information reviewed and entered into eRehab system by [facility]. Karen Kays., CCC/SLP, PPS Coordinator.  Information including medical coding, functional ability and quality indicators will be reviewed and updated through discharge.

## 2024-02-11 NOTE — Progress Notes (Signed)
 Inpatient Rehabilitation Center Individual Statement of Services  Patient Name:  Kenneth [name]  Date:  02/11/2024  Welcome to the Inpatient Rehabilitation Center.  Our goal is to provide you with an individualized program based on your diagnosis and situation, designed to meet your specific needs.  With this comprehensive rehabilitation program, you will be expected to participate in at least 3 hours of rehabilitation therapies Monday-Friday, with modified therapy programming on the weekends.  Your rehabilitation program will include the following services:  Physical Therapy (PT), Occupational Therapy (OT), Speech Therapy (ST), 24 hour per day rehabilitation nursing, Therapeutic Recreaction (TR), Neuropsychology, Care Coordinator, Rehabilitation Medicine, Nutrition Services, and Pharmacy Services  Weekly team conferences will be held on Wednesday to discuss your progress.  Your Inpatient Rehabilitation Care Coordinator will talk with you frequently to get your input and to update you on team discussions.  Team conferences with you and your family in attendance may also be held.  Expected length of stay: 7-10 days  Overall anticipated outcome: mod/I-supervision cues  Depending on your progress and recovery, your program may change. Your Inpatient Rehabilitation Care Coordinator will coordinate services and will keep you informed of any changes. Your Inpatient Rehabilitation Care Coordinator's name and contact numbers are listed  below.  The following services may also be recommended but are not provided by the Inpatient Rehabilitation Center:   Home Health Rehabiltiation Services Outpatient Rehabilitation Services    Arrangements will be made to provide these services after discharge if needed.  Arrangements include referral to agencies that provide these services.  Your insurance has been verified to be:  [employer]y [employer] Your primary doctor is:  Herbie Drape  Pertinent information  will be shared with your doctor and your insurance company.  Inpatient Rehabilitation Care Coordinator:  Dossie Der, [place] [phone] or Luna Glasgow  Information discussed with and copy given to patient by: Lucy Chris, 02/11/2024, 10:42 AM

## 2024-02-11 NOTE — Progress Notes (Signed)
 Speech Language Pathology Daily Session Note  Patient Details  Name: Kenneth [name] MRN: [id number] Date of Birth: 10-18-1961  Today's Date: 02/11/2024 SLP Individual Time: 1107-1200 SLP Individual Time Calculation (min): 53 min  Short Term Goals: Week 1: SLP Short Term Goal 1 (Week 1): STG = LTG due to ELOS  Skilled Therapeutic Interventions: SLP conducted skilled therapy session targeting communication and swallowing goals. Patient completed oral care at the sink with supervision, then participated in trials of thin liquids. Patient completed cup and straw sips of thin liquids with no overt s/sx of penetration/aspiration, though demonstrated silent aspiration of thin liquids per most recent MBS, thus cannot rule out continued silent aspiration. Compared to MBS completed 3/4, patient timeliness of swallow initiation appears improved. Recommend repeated MBS to determine current swallowing function compared with previous. SLP facilitated oropharyngeal swallowing exercises including effortful swallow x10, Masako x10, and CTAR x3 sets of 10 plus 30s hold. Provided patient with handout to promote carryover and recommended patient complete exercises 3x/day. In remaining minutes of session, SLP and patient discussed SLOP speech intelligibility strategies. Patient's speech intelligibility ~85-90% this date at the conversation level. Patient benefited from supervision cues to incorporate speech intelligibility strategies to achieve 100% intelligibility. Patient was left in chair with call bell in reach and chair alarm set. SLP will continue to target goals per plan of care.       Pain Pain Assessment Pain Scale: 0-10 Pain Score: 0-No pain  Therapy/Group: Individual Therapy  Jeannie Done, M.A., CCC-SLP  Yetta Barre 02/11/2024, 12:02 PM

## 2024-02-11 NOTE — Progress Notes (Signed)
 Followed up on right great toe nail due to therapy concerns. Right great toe nail with large horn that was partially removed about 3 years ago by podiatry. Patient has not followed up as it does not hurt or bother him--frustrated that it was not taken care of at first visit. Advised that this will take follow up visits due to its size to be fully treated. To cover nail with band aid if nail gets caught up in sock. He has multiple other nails that need care. He will follow up with podiatry after discharge.

## 2024-02-11 NOTE — Progress Notes (Signed)
 Physical Therapy Session Note  Patient Details  Name: Kenneth [name] MRN: [id number] Date of Birth: 04-14-1961  Today's Date: 02/11/2024 PT Individual Time: 1300-1415 PT Individual Time Calculation (min): 75 min   Short Term Goals: Week 1:  PT Short Term Goal 1 (Week 1): STGs = LTGs  Skilled Therapeutic Interventions/Progress Updates:      Pt sitting in recliner to start - younger sister at the bedside. Pt reports chronic L knee pain, unrated. Mobility provided for pain management. Sit<>stand to hurrycane with CGA. Ambulates with CGA and hurrycane from his room to main rehab gym, ~18ft - had x1 LOB with his R foot caught during swing needing minA for balance recovery.   Discussed home setup, PLOF, and DME access at home. Pt reports intermittent assistance will be available from his sisters. Has a RW, cane and wheelchair. Able to live on main floor with ~3 STE with BHR's.   BERG balance completed to assess falls risk and balance.  See below for details. Educated patient on his score and the category of his falls risk. Also educated him on need for B UE support for balance while ambulating such as RW vs rollator. Pt receptive and understanding, also in agreement.   Patient demonstrates increased fall risk as noted by score of   16/56 on Berg Balance Scale.  (<36= high risk for falls, close to 100%; 37-45 significant >80%; 46-51 moderate >50%; 52-55 lower >25%)  TUG completed x26 seconds using hurrycane with CGA for safety. Scores > 13.5 seconds indicate increased falls risk.   Retrieved rollator and educated patient on general safety precautions and functions. Pt unsure if he has one at home but he will ask his sisters. Adjusted rollator to fit. Pt ambulated 2x174ft with supervision and rollator - improved stride length, confidence, gait speed, and general stability with rollator vs hurrycane - pt in agreement.   TUG completed x23 seconds using rollator with CGA for safety.   Pt  instructed in using the rollator to assess activity tolerance and endurance. He covered 452ft within the 6 minutes - required seated rest after 3.5 minutes 2/2 fatigue and unable to resume for the remaining time limit.   Completed Nustep at L5 resistance using BUE/BLE to continue addressing endurance and activity tolerance. Pt maintained cadence > 40 spm and achieved a total of 490 steps.   Returned to his room and left sitting upright in recliner with seat belt alarm on. Sister asking about fungus growth on his R big toe - messaged MD/RN for follow up care. All needs met.    Therapy Documentation Precautions:  Precautions Precautions: Fall Recall of Precautions/Restrictions: Intact Restrictions Weight Bearing Restrictions Per Provider Order: No General:    Balance: Balance Balance Assessed: Yes Standardized Balance Assessment Standardized Balance Assessment: Berg Balance Test;Timed Up and Go Test Berg Balance Test Sit to Stand: Able to stand  independently using hands Standing Unsupported: Needs several tries to stand 30 seconds unsupported Sitting with Back Unsupported but Feet Supported on Floor or Stool: Able to sit safely and securely 2 minutes Stand to Sit: Uses backs of legs against chair to control descent Transfers: Able to transfer with verbal cueing and /or supervision Standing Unsupported with Eyes Closed: Unable to keep eyes closed 3 seconds but stays steady Standing Ubsupported with Feet Together: Able to place feet together independently but unable to hold for 30 seconds From Standing, Reach Forward with Outstretched Arm: Reaches forward but needs supervision From Standing Position, Pick up Object  from Floor: Unable to try/needs assist to keep balance From Standing Position, Turn to Look Behind Over each Shoulder: Needs assist to keep from losing balance and falling Turn 360 Degrees: Needs assistance while turning Standing Unsupported, Alternately Place Feet on  Step/Stool: Needs assistance to keep from falling or unable to try Standing Unsupported, One Foot in Front: Loses balance while stepping or standing Standing on One Leg: Unable to try or needs assist to prevent fall Total Score: 16 Timed Up and Go Test TUG: Normal TUG Normal TUG (seconds): 26 (with hurrycane and CGA)   Therapy/Group: Individual Therapy  Treven Holtman P Dewel Lotter  PT, DPT, CSRS  02/11/2024, 2:08 PM

## 2024-02-11 NOTE — IPOC Note (Signed)
 Overall Plan of Care [facility] Patient Details Name: Kenneth [name] MRN: [id number] DOB: Aug 10, 1961  Admitting Diagnosis: Stroke (cerebrum) [facility])  Hospital Problems: Principal Problem:   Stroke (cerebrum) [facility])     Functional Problem List: Nursing Bladder, Safety, Pain, Endurance, Medication Management, Skin Integrity  PT Balance, Endurance, Pain, Motor, Safety  OT Balance, Motor, Sensory  SLP Nutrition, Linguistic  TR         Basic ADL's: OT Bathing, Dressing, Toileting, Grooming, Eating     Advanced  ADL's: OT       Transfers: PT Bed Mobility, Bed to Chair, Car, Occupational psychologist, Research scientist (life sciences): PT Ambulation, Stairs     Additional Impairments: OT Fuctional Use of Upper Extremity  SLP Swallowing, Communication expression    TR      Anticipated Outcomes Item Anticipated Outcome  Self Feeding independent  Swallowing  supervision   Basic self-care  Mod independent  Toileting  Mod Independent   Bathroom Transfers Mod I to toilet, supervision to shower  Bowel/Bladder  manage bladder w mod I assist  Transfers  Supervision  Locomotion  Supervision  Communication  supervision  Cognition     Pain  < 4 with prns  Safety/Judgment  manage safety w cues   Therapy Plan: PT Intensity: Minimum of 1-2 x/day ,45 to 90 minutes PT Duration Estimated Length of Stay: 7-10 Days OT Intensity: Minimum of 1-2 x/day, 45 to 90 minutes OT Frequency: 5 out of 7 days OT Duration/Estimated Length of Stay: 7-10 days SLP Intensity: Minumum of 1-2 x/day, 30 to 90 minutes SLP Frequency: 3 to 5 out of 7 days SLP Duration/Estimated Length of Stay: 7-10 days   Team Interventions: Nursing Interventions Patient/Family Education, Pain Management, Medication Management, Bladder Management, Disease Management/Prevention, Skin Care/Wound Management  PT interventions Ambulation/gait training, Metallurgist training, Cognitive remediation/compensation, Community  reintegration, DME/adaptive equipment instruction, Neuromuscular re-education, Psychosocial support, Stair training, UE/LE Strength taining/ROM, Discharge planning, Functional electrical stimulation, Skin care/wound management, Therapeutic Activities, UE/LE Coordination activities, Pain management, Disease management/prevention, Splinting/orthotics, Visual/perceptual remediation/compensation, Therapeutic Exercise, Functional mobility training, Patient/family education  OT Interventions Balance/vestibular training, Discharge planning, Functional mobility training, Neuromuscular re-education, Psychosocial support, Patient/family education, Self Care/advanced ADL retraining, Therapeutic Exercise, Therapeutic Activities, UE/LE Strength taining/ROM, DME/adaptive equipment instruction, UE/LE Coordination activities  SLP Interventions Dysphagia/aspiration precaution training, Speech/Language facilitation, Cueing hierarchy, Therapeutic Activities, Functional tasks, Multimodal communication approach, Patient/family education  TR Interventions    SW/CM Interventions Discharge Planning, Psychosocial Support, Patient/Family Education   Barriers to Discharge MD  Medical stability  Nursing Lack of/limited family support cane in the community; 1 fall past 6 months, sister to assist as needed at discharge, provides dinner meals.  PT      OT      SLP      SW       Team Discharge Planning: Destination: PT-Home ,OT- Home , SLP-Home Projected Follow-up: PT-24 hour supervision/assistance, Outpatient PT, OT-  Home health OT, SLP-Home Health SLP, Outpatient SLP Projected Equipment Needs: PT-To be determined, OT- To be determined, SLP-None recommended by SLP Equipment Details: PT- , OT-  Patient/family involved in discharge planning: PT- Patient,  OT-Patient, SLP-Patient  MD ELOS: 7-10 days Medical Rehab Prognosis:  Excellent Assessment: The patient has been admitted for CIR therapies with the diagnosis of acute  left CVA. The team will be addressing functional mobility, strength, stamina, balance, safety, adaptive techniques and equipment, self-care, bowel and bladder mgt, patient and caregiver education. Goals have been set at MinA/S. Anticipated  discharge destination is home.        See Team Conference Notes for weekly updates to the plan of care

## 2024-02-11 NOTE — Progress Notes (Signed)
 PROGRESS NOTE   Subjective/Complaints: No new complaints this morning Having shower with OT Hgb decreased, will order stool occult and repeat tomorrow   ROS:   Pt denies SOB, abd pain, CP, N/V/C/D, and vision changes    Objective:   No results found. Recent Labs    02/09/24 0636 02/11/24 0609  WBC 6.0 5.3  HGB 13.0 12.4*  HCT 38.0* 36.7*  PLT 254 264   Recent Labs    02/08/24 1634 02/09/24 0636  NA 138 140  K 3.8 3.7  CL 107 108  CO2 24 26  GLUCOSE 180* 110*  BUN 10 9  CREATININE 1.14 1.20  CALCIUM 8.7* 8.6*    Intake/Output Summary (Last 24 hours) at 02/11/2024 0925 Last data filed at 02/11/2024 0802 Gross per 24 hour  Intake 720 ml  Output 300 ml  Net 420 ml        Physical Exam: Vital Signs Blood pressure 134/73, pulse (!) 56, temperature 97.7 F (36.5 C), temperature source Oral, resp. rate 18, height 5\' 4"  (1.626 m), weight 70 kg, SpO2 98%. General: awake, alert, appropriate, sitting up in bed; NAD HENT: conjugate gaze; oropharynx moist CV: Bradycardic Pulmonary: CTA B/L; no W/R/R- good air movement GI: soft, NT, ND, (+)BS Psychiatric: appropriate- more interactive Neurological: Ox3  Skin: Clean and intact without signs of breakdown Neuro:     Mental Status: AAOx3, memory intact, fund of knowledge appropriate Speech/Languate: Naming and repetition intact, moderate dysarthria, follows simple commands   CRANIAL NERVES: II: PERRL. Visual fields full III, IV, VI: EOM intact, no gaze preference or deviation V: normal sensation bilaterally VII: Mild R facial weakness VIII: normal hearing to speech IX, X: normal palatal elevation XI: 4/5 shoulder shrug on the right XII: Tongue deviates to RIght     MOTOR: RUE: 4/5 Deltoid, 4/5 Biceps, 4/5 Triceps,4/5 Grip LUE: 5/5 Deltoid, 5/5 Biceps, 5/5 Triceps, 5/5 Grip RLE: HF 4/5, KE 4+/5, ADF 4+/5, APF 4+/5 LLE: HF 4+/5, KE 5/5, ADF 5/5, APF  5/5     REFLEXES No ankle clonus   SENSORY: Altered sensation to LT RUE  Altered sensation b/l Feet- chronic neuropathy   Coordination: Normal finger to nose ataxia on R   MSK: R shoulder pain with PROM- chronic    Assessment/Plan: 1. Functional deficits which require 3+ hours per day of interdisciplinary therapy in a comprehensive inpatient rehab setting. Physiatrist is providing close team supervision and 24 hour management of active medical problems listed below. Physiatrist and rehab team continue to assess barriers to discharge/monitor patient progress toward functional and medical goals  Care Tool:  Bathing    Body parts bathed by patient: Right arm, Left arm, Chest, Abdomen, Front perineal area, Buttocks, Right upper leg, Face, Left lower leg, Right lower leg, Left upper leg         Bathing assist Assist Level: Contact Guard/Touching assist     Upper Body Dressing/Undressing Upper body dressing   What is the patient wearing?: Pull over shirt    Upper body assist Assist Level: Set up assist    Lower Body Dressing/Undressing Lower body dressing      What is the patient wearing?: Underwear/pull up,  Pants     Lower body assist Assist for lower body dressing: Contact Guard/Touching assist     Toileting Toileting    Toileting assist Assist for toileting: Contact Guard/Touching assist     Transfers Chair/bed transfer  Transfers assist     Chair/bed transfer assist level: Minimal Assistance - Patient > 75%     Locomotion Ambulation   Ambulation assist      Assist level: Minimal Assistance - Patient > 75% Assistive device:  (hurrycane) Max distance: 200'   Walk 10 feet activity   Assist     Assist level: Minimal Assistance - Patient > 75% Assistive device:  (hurrycane)   Walk 50 feet activity   Assist    Assist level: Minimal Assistance - Patient > 75% Assistive device:  (hurrycane)    Walk 150 feet activity   Assist     Assist level: Minimal Assistance - Patient > 75% Assistive device:  (hurrycane)    Walk 10 feet on uneven surface  activity   Assist     Assist level: Minimal Assistance - Patient > 75%     Wheelchair     Assist Is the patient using a wheelchair?: Yes Type of Wheelchair: Manual    Wheelchair assist level: Dependent - Patient 0% Max wheelchair distance: 150'    Wheelchair 50 feet with 2 turns activity    Assist        Assist Level: Dependent - Patient 0%   Wheelchair 150 feet activity     Assist      Assist Level: Dependent - Patient 0%   Blood pressure 134/73, pulse (!) 56, temperature 97.7 F (36.5 C), temperature source Oral, resp. rate 18, height 5\' 4"  (1.626 m), weight 70 kg, SpO2 98%.   Medical Problem List and Plan: 1. Functional deficits secondary to acute CVA  left corona radiata, left lentiform nucleus and posterior limb of left internal capsule likely due to large vessel disease             -patient may  shower             -ELOS/Goals: 7-10 days, PT/OT/SLP min A/sup             Con't CIR PT and OT  D3 started  2.  Antithrombotics: -DVT/anticoagulation:  Pharmaceutical: Lovenox             -antiplatelet therapy: DAPT X 3 months followed by Plavix alone.  3. Pain Management: Tylenol prn. Add voltaren gel to right hip and left knee.  4. Mood/Behavior/Sleep: LCSW to follow for evaluation and support.              -antipsychotic agents: N/A 5. Neuropsych/cognition: This patient is capable of making decisions on his own behalf. 6. Skin/Wound Care: Routine pressure relief measures.  7. Fluids/Electrolytes/Nutrition: Monitor I/O. Check CMET IN AM. HTN: Monitor BP TID--on Lotensin.  8. HTN: Monitor BP TID--continue Lisinopri/amlodipine. Avoid hypoperfusion.           Magnesium supplement started HS   9.  T2DM with neuropathy: Hgb A1C- 8.8. Monitor BS ac/hs and use SSI for elevated BS.              --was on jardiance at home-->will hold  due to nectar liquids and concerns for dehydration   3/8-3/9 CBGs 105-155- con't regimen CBG (last 3)  Recent Labs (last 2 labs)       Recent Labs    02/07/24 2118 02/08/24 0652 02/08/24 1133  GLUCAP 155*  115* 177*        10. Hypokalemia: Improved after supplement. Recheck in am.  3/8- K+ 3.7 11. Hypomagnesemia: Improved after supplement. Recheck in am. 3/8- Last Mg 1.7- wasn't done this AM- will draw Monday  12. BPH?: On flomax.  13. Melanoma with lung/brain mets: Treated with immunotherapy and Gamma knife. In remission. F/U outpatient [facility] specialist.  14. Hx of chronic calcific pancreatitis: Has been sober for 2 years.  15. Hyperlipidemia: LDL at goal.  Lipitor 20mg  was started.  16. Facial rash.  Suspect this is seborrheic dermatitis. He uses triamcinolone cream at home for this, has not tried any other creams previously.             -Will start ketoconazole cream, triamcinolone cream as needed 17. Hx of GI bleed 2023             -continue PPI  18. Dysphagia             -Continue SLP   Continue Nectar thick liquids- heart healthy diet  19. Suboptimal potassium: 20klor ordered  20. Polypharmacy: benadryl and compazine d/ced to minimize risk of ulcer formation with coadministration with potassium     LOS: 3 days A FACE TO FACE EVALUATION WAS PERFORMED  Kenneth [name] Kenneth [name] Kenneth [name] 02/11/2024, 9:25 AM

## 2024-02-11 NOTE — Plan of Care (Signed)
 Problem: Consults Goal: RH STROKE PATIENT EDUCATION Description: See Patient Education module for education specifics  02/11/2024 1709 by Allena Napoleon, RN Outcome: Progressing 02/11/2024 1708 by Allena Napoleon, RN Outcome: Progressing   Problem: RH BLADDER ELIMINATION Goal: RH STG MANAGE BLADDER WITH ASSISTANCE Description: STG Manage Bladder With toileting Assistance 02/11/2024 1709 by Allena Napoleon, RN Outcome: Progressing 02/11/2024 1708 by Allena Napoleon, RN Outcome: Progressing Goal: RH STG MANAGE BLADDER WITH MEDICATION WITH ASSISTANCE Description: STG Manage Bladder With Medication With mod I Assistance. 02/11/2024 1709 by Allena Napoleon, RN Outcome: Progressing 02/11/2024 1708 by Allena Napoleon, RN Outcome: Progressing   Problem: RH SKIN INTEGRITY Goal: RH STG SKIN FREE OF INFECTION/BREAKDOWN Description: Manage skin with cues/reminders assistance 02/11/2024 1709 by Allena Napoleon, RN Outcome: Progressing 02/11/2024 1708 by Allena Napoleon, RN Outcome: Progressing Goal: RH STG MAINTAIN SKIN INTEGRITY WITH ASSISTANCE Description: STG Maintain Skin Integrity With min Assistance. 02/11/2024 1709 by Allena Napoleon, RN Outcome: Progressing 02/11/2024 1708 by Allena Napoleon, RN Outcome: Progressing   Problem: RH SAFETY Goal: RH STG ADHERE TO SAFETY PRECAUTIONS W/ASSISTANCE/DEVICE Description: STG Adhere to Safety Precautions With cues  Assistance/Device. 02/11/2024 1709 by Allena Napoleon, RN Outcome: Progressing 02/11/2024 1708 by Allena Napoleon, RN Outcome: Progressing   Problem: RH PAIN MANAGEMENT Goal: RH STG PAIN MANAGED AT OR BELOW PT'S PAIN GOAL Description: < 4 with prns 02/11/2024 1709 by Allena Napoleon, RN Outcome: Progressing 02/11/2024 1708 by Allena Napoleon, RN Outcome: Progressing   Problem: RH KNOWLEDGE DEFICIT Goal: RH STG INCREASE KNOWLEDGE OF  DIABETES Description: Patient and sister will be able to manage DM using educational resources for medications and dietary modification independently 02/11/2024 1709 by Allena Napoleon, RN Outcome: Progressing 02/11/2024 1708 by Allena Napoleon, RN Outcome: Progressing Goal: RH STG INCREASE KNOWLEDGE OF HYPERTENSION Description: Patient and sister will be able to manage HTN using educational resources for medications and dietary modification independently 02/11/2024 1709 by Allena Napoleon, RN Outcome: Progressing 02/11/2024 1708 by Allena Napoleon, RN Outcome: Progressing Goal: RH STG INCREASE KNOWLEGDE OF HYPERLIPIDEMIA Description: Patient and sister will be able to manage HLD using educational resources for medications and dietary modification independently 02/11/2024 1709 by Allena Napoleon, RN Outcome: Progressing 02/11/2024 1708 by Joelynn Dust L, RN Outcome: Progressing Goal: RH STG INCREASE KNOWLEDGE OF STROKE PROPHYLAXIS Description: Patient and sister will be able to manage secondary stroke risks using educational resources for medications and dietary modification independently 02/11/2024 1709 by Allena Napoleon, RN Outcome: Progressing 02/11/2024 1708 by Allena Napoleon, RN Outcome: Progressing   Problem: Education: Goal: Ability to describe self-care measures that may prevent or decrease complications (Diabetes Survival Skills Education) will improve 02/11/2024 1709 by Allena Napoleon, RN Outcome: Progressing 02/11/2024 1708 by Allena Napoleon, RN Outcome: Progressing Goal: Individualized Educational Video(s) 02/11/2024 1709 by Allena Napoleon, RN Outcome: Progressing 02/11/2024 1708 by Allena Napoleon, RN Outcome: Progressing   Problem: Coping: Goal: Ability to adjust to condition or change in health will improve 02/11/2024 1709 by Jacqueleen Pulver L, RN Outcome: Progressing 02/11/2024 1708 by  Allena Napoleon, RN Outcome: Progressing   Problem: Fluid Volume: Goal: Ability to maintain a balanced intake and output will improve 02/11/2024 1709 by Moyses Pavey, Dorien Chihuahua, RN Outcome: Progressing 02/11/2024 1708 by Allena Napoleon, RN Outcome: Progressing   Problem: Health Behavior/Discharge Planning: Goal: Ability to identify and utilize available resources and services will improve 02/11/2024 1709 by Akya Fiorello,  Dorien Chihuahua, RN Outcome: Progressing 02/11/2024 1708 by Allena Napoleon, RN Outcome: Progressing Goal: Ability to manage health-related needs will improve 02/11/2024 1709 by Allena Napoleon, RN Outcome: Progressing 02/11/2024 1708 by Allena Napoleon, RN Outcome: Progressing   Problem: Metabolic: Goal: Ability to maintain appropriate glucose levels will improve 02/11/2024 1709 by Allena Napoleon, RN Outcome: Progressing 02/11/2024 1708 by Allena Napoleon, RN Outcome: Progressing   Problem: Nutritional: Goal: Maintenance of adequate nutrition will improve 02/11/2024 1709 by Allena Napoleon, RN Outcome: Progressing 02/11/2024 1708 by Allena Napoleon, RN Outcome: Progressing Goal: Progress toward achieving an optimal weight will improve 02/11/2024 1709 by Allena Napoleon, RN Outcome: Progressing 02/11/2024 1708 by Allena Napoleon, RN Outcome: Progressing   Problem: Skin Integrity: Goal: Risk for impaired skin integrity will decrease 02/11/2024 1709 by Allena Napoleon, RN Outcome: Progressing 02/11/2024 1708 by Allena Napoleon, RN Outcome: Progressing   Problem: Tissue Perfusion: Goal: Adequacy of tissue perfusion will improve 02/11/2024 1709 by Allena Napoleon, RN Outcome: Progressing 02/11/2024 1708 by Allena Napoleon, RN Outcome: Progressing

## 2024-02-12 ENCOUNTER — Inpatient Hospital Stay (HOSPITAL_COMMUNITY)

## 2024-02-12 LAB — CBC WITH DIFFERENTIAL/PLATELET
Abs Immature Granulocytes: 0.02 10*3/uL (ref 0.00–0.07)
Basophils Absolute: 0.1 10*3/uL (ref 0.0–0.1)
Basophils Relative: 1 %
Eosinophils Absolute: 0.5 10*3/uL (ref 0.0–0.5)
Eosinophils Relative: 9 %
HCT: 35.2 % — ABNORMAL LOW (ref 39.0–52.0)
Hemoglobin: 12.1 g/dL — ABNORMAL LOW (ref 13.0–17.0)
Immature Granulocytes: 0 %
Lymphocytes Relative: 37 %
Lymphs Abs: 2.4 10*3/uL (ref 0.7–4.0)
MCH: 29.3 pg (ref 26.0–34.0)
MCHC: 34.4 g/dL (ref 30.0–36.0)
MCV: 85.2 fL (ref 80.0–100.0)
Monocytes Absolute: 0.8 10*3/uL (ref 0.1–1.0)
Monocytes Relative: 12 %
Neutro Abs: 2.6 10*3/uL (ref 1.7–7.7)
Neutrophils Relative %: 41 %
Platelets: 275 10*3/uL (ref 150–400)
RBC: 4.13 MIL/uL — ABNORMAL LOW (ref 4.22–5.81)
RDW: 13.7 % (ref 11.5–15.5)
WBC: 6.4 10*3/uL (ref 4.0–10.5)
nRBC: 0 % (ref 0.0–0.2)

## 2024-02-12 LAB — GLUCOSE, CAPILLARY
Glucose-Capillary: 87 mg/dL (ref 70–99)
Glucose-Capillary: 117 mg/dL — ABNORMAL HIGH (ref 70–99)
Glucose-Capillary: 137 mg/dL — ABNORMAL HIGH (ref 70–99)

## 2024-02-12 NOTE — Progress Notes (Signed)
 Physical Therapy Session Note  Patient Details  Name: Kenneth [name] MRN: [id number] Date of Birth: 05-05-1961  Today's Date: 02/12/2024 PT Individual Time: 1300-1415 PT Individual Time Calculation (min): 75 min   Short Term Goals: Week 1:  PT Short Term Goal 1 (Week 1): STGs = LTGs  Skilled Therapeutic Interventions/Progress Updates:      Pt sitting upright in recliner - no reports of pain and agreeable to PT tx. Sister confirms getting both a hurrycane and a rollator that should be delivered prior to DC. Discussed receommendation for rollator rather than the hurrycane due to balance deficits and his falls risk - both voiced understanding.   Sit<>stand to rollator with supervision assist. Ambulated from CIR floor, downstairs to [facility] (standing rest break in elevator) with supervision and rollator. Able to ambulat ethrough [facility] Lobby at supervision level using the rollator. However, once outdoors, he required minA for stability and safety while ambulating on unlevel surfaces. Unlevel surface gait training included walking on grass, ambulating on unlevel sloped/paved surfaces, and navigating thresholds. Pt fatigue impacting balance and safety, as well as catching his R foot at thresholds and on unlevel surfaces.   Pt noted that back wheel of his rollator would "lock up" even if the brake was not engaged. Therefore, used hurrycane for remainder of session. Ambulated with hurrcane at [facility] to light minA for balance for distances > 163ft while outdoors and while indoors. Required x1 seated rest break before returning up to CIR floor.   Worked on Water engineer for DC planning as patient as 3 STE home with 2 hand rails that he can reach. Navigated x12 steps with light minA (Fading to CGA for last x4 steps) with a step-to pattern for both directions. Min cues for sequencing with L foot first for ascent and R foot first for descent. Mild difficulty with clearing R foot above the step height  but able to when focused and aware.   Pt returned to his room and requesting to toilet before returning to chair. Pt able to sit safely on the commode without assist and able to lower brief/pants in standing without LOB. Pt asking for ++ time to have BM - family in room and patient made aware of pull cord for staff assistance when completed. NT also made aware. All needs met.   Therapy Documentation Precautions:  Precautions Precautions: Fall Recall of Precautions/Restrictions: Intact Restrictions Weight Bearing Restrictions Per Provider Order: No General:      Therapy/Group: Individual Therapy  [name] 02/12/2024, 7:58 AM

## 2024-02-12 NOTE — Progress Notes (Signed)
 Unable to see patient today as he is in [facility]

## 2024-02-12 NOTE — Progress Notes (Signed)
 Speech Language Pathology Daily Session Note  Patient Details  Name: Kenneth [name] MRN: [id number] Date of Birth: 1961/03/01  Today's Date: 02/12/2024 SLP Individual Time: 4098-1191 SLP Individual Time Calculation (min): 52 min  Short Term Goals: Week 1: SLP Short Term Goal 1 (Week 1): STG = LTG due to ELOS  Skilled Therapeutic Interventions: SLP conducted skilled therapy session targeting dysphagia and communication goals. Upon entry, discovered patient with cup of thin liquids. Patient endorses that this was provided by nurse who also had him take medications with thin liquids. Educated Education administrator re: recommendation for thickened liquids and medications administered whole in puree due to silent aspiration. Nurse verbalized understanding. Then began session with discussion of MBS results including continuation of silent aspiration with thin liquids and ineffectiveness of chin tuck to prevent aspiration events. Patient's swallowing deficits are primarily due to mistiming, though pharyngeal efficiency is still mildly impaired as evidenced by reduced pharyngeal stripping wave and reduced laryngeal vestibule closure during the swallow, thus will continue with pharyngeal strengthening exercises. SLP facilitated completion of pharyngeal strengthening exercises. Patient benefited from supervision cues throughout. Patient completed oral care and then participated in sips of thin water from cup and straw. With straw sips, demonstrated immediate cough x1. SLP will initiate water protocol however recommending no straws. Water protocol parameters posted in room. In remaining minutes of session, SLP facilitated use of SLOP speech intelligibility strategies. Patient recalled 4/4 strategies with modI and utilized in conversation with supervision. Intelligibility reached 100%. Patient was left in chair with call bell in reach and chair alarm set. SLP will continue to target goals per plan of care.        Pain Pain Assessment Pain Scale: 0-10 Pain Score: 0-No pain  Therapy/Group: Individual Therapy  Jeannie Done, M.A., CCC-SLP  Yetta Barre 02/12/2024, 12:11 PM

## 2024-02-12 NOTE — Procedures (Signed)
 Modified Barium Swallow Study  Patient Details  Name: Kenneth [name] MRN: [id number] Date of Birth: [birth year]  Today's Date: 02/12/2024  Modified Barium Swallow completed.  Full report located under Chart Review in the Imaging Section.  History of Present Illness Kenneth [name] is a 63 y.o. male with medical history significant of hypertension, hyperlipidemia, gastritis, stroke, diabetes, neuropathy, metastatic melanoma in remission presenting with focal neurologic deficit characterized by right sided weakness and difficulty speaking. Two adjacent acute infarcts within the left corona radiata, left  lentiform nucleus and posterior limb of left internal capsule (individually measuring up to 2 cm).   Clinical Impression Pt exhibits improved moderate oropharyngeal dysphagia suspected to be secondary to cranial nerve deficits. He ultimately has adequate oral control and Kenneth, but swallow initiation consistently requires a gross collection of each bolus at the level of the vallecula and pyriform sinuses. Epiglottic inversion has improved resulting in improved pharyngeal efficiency and mistiming of the swallow now most significantly impacts safety. Thin liquids are silently aspirated (PAS 8) consistently during cup sips and trials of chin tuck strategy (3/4 attempts). Pt has a strong cough and when cued to do so, is able to clear a significant amount of aspirated material. Patient's overall airway protection has improved, as no significant airway penetration/aspiration observed with nectar thick liquids. Recommend continuation of regular/NTL diet with patient no longer requiring chin tuck posture during drinking. SLP will continue trials of thin liquids at bedside and will need to repeat MBS prior to discharge given silent aspiration when patient appears to improve clinically with swallow timing overall.   SLP will plan to initiate water protocol to promote hydration in between meals. Administer  medications whole with puree.  Factors that may increase risk of adverse event in presence of aspiration Kenneth [name] & Kenneth [name] 2021):    Swallow Evaluation Recommendations Recommendations: PO diet PO Diet Recommendation: Regular;Mildly thick liquids (Level 2, nectar thick) Liquid Administration via: Spoon;Cup;No straw Medication Administration: Whole meds with puree Supervision: Patient able to self-feed;Intermittent supervision/cueing for swallowing strategies Swallowing strategies  : Minimize environmental distractions;Slow rate;Small bites/sips Postural changes: Position pt fully upright for meals Oral care recommendations: Oral care BID (2x/day) Caregiver Recommendations: Avoid jello, ice cream, thin soups, popsicles;Remove water pitcher  Kenneth [name], M.A., CCC-SLP  Kenneth [name] 02/12/2024,9:40 AM

## 2024-02-12 NOTE — Plan of Care (Signed)
  Problem: Consults Goal: RH STROKE PATIENT EDUCATION Description: See Patient Education module for education specifics  Outcome: Progressing   Problem: RH BLADDER ELIMINATION Goal: RH STG MANAGE BLADDER WITH ASSISTANCE Description: STG Manage Bladder With toileting Assistance Outcome: Progressing Goal: RH STG MANAGE BLADDER WITH MEDICATION WITH ASSISTANCE Description: STG Manage Bladder With Medication With mod I Assistance. Outcome: Progressing   Problem: RH SKIN INTEGRITY Goal: RH STG SKIN FREE OF INFECTION/BREAKDOWN Description: Manage skin with cues/reminders assistance Outcome: Progressing Goal: RH STG MAINTAIN SKIN INTEGRITY WITH ASSISTANCE Description: STG Maintain Skin Integrity With min Assistance. Outcome: Progressing   Problem: RH SAFETY Goal: RH STG ADHERE TO SAFETY PRECAUTIONS W/ASSISTANCE/DEVICE Description: STG Adhere to Safety Precautions With cues  Assistance/Device. Outcome: Progressing   Problem: RH PAIN MANAGEMENT Goal: RH STG PAIN MANAGED AT OR BELOW PT'S PAIN GOAL Description: < 4 with prns Outcome: Progressing   Problem: RH KNOWLEDGE DEFICIT Goal: RH STG INCREASE KNOWLEDGE OF DIABETES Description: Patient and sister will be able to manage DM using educational resources for medications and dietary modification independently Outcome: Progressing Goal: RH STG INCREASE KNOWLEDGE OF HYPERTENSION Description: Patient and sister will be able to manage HTN using educational resources for medications and dietary modification independently Outcome: Progressing Goal: RH STG INCREASE KNOWLEGDE OF HYPERLIPIDEMIA Description: Patient and sister will be able to manage HLD using educational resources for medications and dietary modification independently Outcome: Progressing Goal: RH STG INCREASE KNOWLEDGE OF STROKE PROPHYLAXIS Description: Patient and sister will be able to manage secondary stroke risks using educational resources for medications and dietary  modification independently Outcome: Progressing   Problem: Education: Goal: Ability to describe self-care measures that may prevent or decrease complications (Diabetes Survival Skills Education) will improve Outcome: Progressing Goal: Individualized Educational Video(s) Outcome: Progressing   Problem: Coping: Goal: Ability to adjust to condition or change in health will improve Outcome: Progressing   Problem: Fluid Volume: Goal: Ability to maintain a balanced intake and output will improve Outcome: Progressing   Problem: Health Behavior/Discharge Planning: Goal: Ability to identify and utilize available resources and services will improve Outcome: Progressing Goal: Ability to manage health-related needs will improve Outcome: Progressing   Problem: Metabolic: Goal: Ability to maintain appropriate glucose levels will improve Outcome: Progressing   Problem: Nutritional: Goal: Maintenance of adequate nutrition will improve Outcome: Progressing Goal: Progress toward achieving an optimal weight will improve Outcome: Progressing   Problem: Skin Integrity: Goal: Risk for impaired skin integrity will decrease Outcome: Progressing   Problem: Tissue Perfusion: Goal: Adequacy of tissue perfusion will improve Outcome: Progressing

## 2024-02-12 NOTE — Progress Notes (Signed)
 Occupational Therapy Session Note  Patient Details  Name: Kenneth [name] MRN: [id number] Date of Birth: 1960/12/11  Today's Date: 02/12/2024 OT Individual Time: 1000-1045 OT Individual Time Calculation (min): 45 min    Short Term Goals: Week 1:  OT Short Term Goal 1 (Week 1): Pt will demonstrate improved RLE coordination to ambulate to bathroom with LRAD with supervision. OT Short Term Goal 2 (Week 1): Pt will demonstrate improved R FMC to open lunch tray items and cut his food independently.  Skilled Therapeutic Interventions/Progress Updates:     Pt received reclined in bed presenting to be in good spirits receptive to skilled OT session reporting 0/10 pain- OT offering intermittent rest breaks, repositioning, and therapeutic support to optimize participation in therapy session. Focus this session dynamic balance, R UE functional use, and BADL retraining.   Pt transitioned to EOB using bed features supervision. Doff/donned clean shirt with supervision. Pt able to doff clean socks and shoes crossing B LEs into modified figure-four position and tie shoes with increased time +maximal effort.   Pt completed functional mobility > therapy gym using rollator for endurance training to increase activity tolerance for higher level IADLs with CGA required for balance. Pt with intermittent R foot drop with min verbal cues required for R foot step height/length. Min verbal cues required to recall need to lock break prior to sit <> stands.   Engaged pt in dynamic standing balance dual tasking activity in front of large vertical mirror for increased visual feedback of body alignment. Pt tasked with retrieving squigz from rollator seat using R UE donning them onto mirror with simulate skills required for BADLs and IADLs with task focused on decreasing compensatory body movements during functional reaching. Pt completed 15 reps during activity with increased time required for motor planning and min-mod verbal  cues for R UE positioning to avoid compensatory grasp or shoulder elevation. Pt then tasked with donning colored rings onto squigz using R UE for increased targeted reach and in-hand manipulation challenge with Pt able to complete with min dropping +increased time d/t slowed motor movements. Seated rest break providing for energy conservation. Pt then removed both rings and squigz from mirror using R UE for increased maintained grasp and reactionary balance challenge with Pt able to complete with CGA overall +increased time.   Pt ambulated gym > room using rollator with CGA. Pt was left resting in recliner with call bell in reach, seatbelt alarm on, and all needs met.    Therapy Documentation Precautions:  Precautions Precautions: Fall Recall of Precautions/Restrictions: Intact Restrictions Weight Bearing Restrictions Per Provider Order: No   Therapy/Group: Individual Therapy  Clide Deutscher 02/12/2024, 7:49 AM

## 2024-02-13 ENCOUNTER — Encounter: Payer: Self-pay | Admitting: Family Medicine

## 2024-02-13 DIAGNOSIS — I639 Cerebral infarction, unspecified: Secondary | ICD-10-CM | POA: Diagnosis not present

## 2024-02-13 DIAGNOSIS — N182 Chronic kidney disease, stage 2 (mild): Secondary | ICD-10-CM | POA: Insufficient documentation

## 2024-02-13 LAB — CBC WITH DIFFERENTIAL/PLATELET
Abs Immature Granulocytes: 0.01 10*3/uL (ref 0.00–0.07)
Basophils Absolute: 0.1 10*3/uL (ref 0.0–0.1)
Basophils Relative: 1 %
Eosinophils Absolute: 0.4 10*3/uL (ref 0.0–0.5)
Eosinophils Relative: 6 %
HCT: 33.6 % — ABNORMAL LOW (ref 39.0–52.0)
Hemoglobin: 11.5 g/dL — ABNORMAL LOW (ref 13.0–17.0)
Immature Granulocytes: 0 %
Lymphocytes Relative: 41 %
Lymphs Abs: 2.2 10*3/uL (ref 0.7–4.0)
MCH: 29.3 pg (ref 26.0–34.0)
MCHC: 34.2 g/dL (ref 30.0–36.0)
MCV: 85.5 fL (ref 80.0–100.0)
Monocytes Absolute: 0.7 10*3/uL (ref 0.1–1.0)
Monocytes Relative: 12 %
Neutro Abs: 2.2 10*3/uL (ref 1.7–7.7)
Neutrophils Relative %: 40 %
Platelets: 256 10*3/uL (ref 150–400)
RBC: 3.93 MIL/uL — ABNORMAL LOW (ref 4.22–5.81)
RDW: 13.5 % (ref 11.5–15.5)
WBC: 5.5 10*3/uL (ref 4.0–10.5)
nRBC: 0 % (ref 0.0–0.2)

## 2024-02-13 LAB — GLUCOSE, CAPILLARY
Glucose-Capillary: 129 mg/dL — ABNORMAL HIGH (ref 70–99)
Glucose-Capillary: 112 mg/dL — ABNORMAL HIGH (ref 70–99)
Glucose-Capillary: 148 mg/dL — ABNORMAL HIGH (ref 70–99)

## 2024-02-13 NOTE — Progress Notes (Signed)
 Speech Language Pathology Daily Session Note  Patient Details  Name: Kenneth [name] MRN: [id number] Date of Birth: 15-Aug-1961  Today's Date: 02/13/2024 SLP Individual Time: 2725-3664 SLP Individual Time Calculation (min): 53 min  Short Term Goals: Week 1: SLP Short Term Goal 1 (Week 1): STG = LTG due to ELOS  Skilled Therapeutic Interventions: SLP conducted skilled therapy session targeting communication and swallowing goals. Upon entry, patient reports completion of dysphagia exercises and breakfast. SLP did assess tolerance with pills taken whole with puree. Patient demonstrated no overt s/sx of penetration/aspiration across all boluses. Given patient's short ELOS, SLP educated patient on how to thicken liquids utilizing Simply Thick thickening packets. Patient verbalized understanding. Patient completed oral care, then underwent trials of thin liquids per water protocol parameters. Demonstrated immediate cough x1 suggesting aspiration of thin liquids and improvements to swallowing sensation given history of silent aspiration on previous MBS. Recommend continuation of Regular/NTL diet at this time, medications administered whole in puree. In remaining minutes of session, discussed SLOP speech intelligibility strategies which patient utilizes with modI at the conversation level to achieve 100% accuracy. Patient was left in chair with call bell in reach and chair alarm set. SLP will continue to target goals per plan of care.       Pain Pain Assessment Pain Scale: 0-10 Pain Score: 0-No pain  Therapy/Group: Individual Therapy  Jeannie Done, M.A., CCC-SLP  Yetta Barre 02/13/2024, 10:24 AM

## 2024-02-13 NOTE — Progress Notes (Signed)
 PROGRESS NOTE   Subjective/Complaints: No new complaints this morning Discussed results of MBS with him and SLP yesterday Discussed team conference today  ROS:   Pt denies SOB, abd pain, CP, N/V/C/D, and vision changes    Objective:   DG Swallowing Func-Speech Pathology Result Date: 02/12/2024 Table formatting from the original result was not included. Modified Barium Swallow Study Patient Details Name: Kenneth [name] MRN: [id number] Date of Birth: 1961-02-17 Today's Date: 02/12/2024 HPI/PMH: HPI: Kenneth [name] is a 63 y.o. male with medical history significant of hypertension, hyperlipidemia, gastritis, stroke, diabetes, neuropathy, metastatic melanoma in remission presenting with focal neurologic deficit characterized by right sided weakness and difficulty speaking. Two adjacent acute infarcts within the left corona radiata, left  lentiform nucleus and posterior limb of left internal capsule (individually measuring up to 2 cm). Clinical Impression: Clinical Impression: Pt exhibits improved moderate oropharyngeal dysphagia suspected to be secondary to cranial nerve deficits. He ultimately has adequate oral control and clearance, but swallow initiation consistently requires a gross collection of each bolus at the level of the vallecula and pyriform sinuses. Epiglottic inversion has improved resulting in improved pharyngeal efficiency and mistiming of the swallow now most significantly impacts safety. Thin liquids are silently aspirated (PAS 8) consistently during cup sips and trials of chin tuck strategy (3/4 attempts). Pt has a strong cough and when cued to do so, is able to clear a significant amount of aspirated material. Patient's overall airway protection has improved, as no significant airway penetration/aspiration observed with nectar thick liquids. Recommend continuation of regular/NTL diet with patient no longer requiring chin tuck  posture during drinking. SLP will continue trials of thin liquids at bedside and will need to repeat MBS prior to discharge given silent aspiration when patient appears to improve clinically with swallow timing overall.   SLP will plan to initiate water protocol to promote hydration in between meals. Administer medications whole with puree. Factors that may increase risk of adverse event in presence of aspiration Rubye Oaks [employer] Clearance Coots 2021): No data recorded Recommendations/Plan: Swallowing Evaluation Recommendations Swallowing Evaluation Recommendations Recommendations: PO diet PO Diet Recommendation: Regular; Mildly thick liquids (Level 2, nectar thick) Liquid Administration via: Spoon; Cup; No straw Medication Administration: Whole meds with puree Supervision: Patient able to self-feed; Intermittent supervision/cueing for swallowing strategies Swallowing strategies  : Minimize environmental distractions; Slow rate; Small bites/sips Postural changes: Position pt fully upright for meals Oral care recommendations: Oral care BID (2x/day) Caregiver Recommendations: Avoid jello, ice cream, thin soups, popsicles; Remove water pitcher Treatment Plan Treatment Plan Treatment recommendations: Therapy as outlined in treatment plan below Follow-up recommendations: Outpatient SLP Functional status assessment: Patient has had a recent decline in their functional status and demonstrates the ability to make significant improvements in function in a reasonable and predictable amount of time. Treatment frequency: Min 2x/week Treatment duration: 2 weeks Interventions: Aspiration precaution training; Compensatory techniques; Patient/family education; Trials of upgraded texture/liquids; Diet toleration management by SLP Recommendations Recommendations for follow up therapy are one component of a multi-disciplinary discharge planning process, led by the attending physician.  Recommendations may be updated based on patient status,  additional functional criteria and insurance authorization. Assessment: Orofacial Exam: Orofacial Exam  Oral Cavity: Oral Hygiene: WFL Oral Cavity - Dentition: Adequate natural dentition Orofacial Anatomy: WFL Oral Motor/Sensory Function: Suspected cranial nerve impairment CN V - Trigeminal: Right motor impairment; Right sensory impairment CN VII - Facial: Right motor impairment CN IX - Glossopharyngeal, CN X - Vagus: WFL CN XII - Hypoglossal: WFL Anatomy: Anatomy: Suspected cervical osteophytes Boluses Administered: Boluses Administered Boluses Administered: Thin liquids (Level 0); Mildly thick liquids (Level 2, nectar thick); Moderately thick liquids (Level 3, honey thick); Puree; Solid  Oral Impairment Domain: Oral Impairment Domain Lip Closure: No labial escape Tongue control during bolus hold: Cohesive bolus between tongue to palatal seal Bolus preparation/mastication: Timely and efficient chewing and mashing Bolus transport/lingual motion: Brisk tongue motion Oral residue: Complete oral clearance Location of oral residue : N/A Initiation of pharyngeal swallow : Pyriform sinuses  Pharyngeal Impairment Domain: Pharyngeal Impairment Domain Soft palate elevation: No bolus between soft palate (SP)/pharyngeal wall (PW) Laryngeal elevation: Complete superior movement of thyroid cartilage with complete approximation of arytenoids to epiglottic petiole Anterior hyoid excursion: Complete anterior movement Epiglottic movement: Complete inversion Laryngeal vestibule closure: Incomplete, narrow column air/contrast in laryngeal vestibule Pharyngeal stripping wave : Present - diminished Pharyngeal contraction (A/P view only): N/A Pharyngoesophageal segment opening: Complete distension and complete duration, no obstruction of flow Tongue base retraction: Trace column of contrast or air between tongue base and PPW Pharyngeal residue: Trace residue within or on pharyngeal structures Location of pharyngeal residue: Valleculae   Esophageal Impairment Domain: No data recorded Pill: No data recorded Penetration/Aspiration Scale Score: Penetration/Aspiration Scale Score 1.  Material does not enter airway: Puree; Solid; Moderately thick liquids (Level 3, honey thick) 2.  Material enters airway, remains ABOVE vocal cords then ejected out: Mildly thick liquids (Level 2, nectar thick) 8.  Material enters airway, passes BELOW cords without attempt by patient to eject out (silent aspiration) : Thin liquids (Level 0) Compensatory Strategies: Compensatory Strategies Compensatory strategies: Yes Chin tuck: Ineffective Ineffective Chin Tuck: Thin liquid (Level 0)   General Information: Caregiver present: No  Diet Prior to this Study: Regular; Mildly thick liquids (Level 2, nectar thick)   Temperature : Normal   Respiratory Status: WFL   Supplemental O2: None (Room air)   History of Recent Intubation: No  Behavior/Cognition: Alert; Cooperative; Pleasant mood Self-Feeding Abilities: Able to self-feed Baseline vocal quality/speech: Normal Volitional Cough: Able to elicit Volitional Swallow: Able to elicit Exam Limitations: Limited visibility (limited visibility during chin tuck posture due to shoulder) Goal Planning: Prognosis for improved oropharyngeal function: Good No data recorded No data recorded Patient/Family Stated Goal: none stated Consulted and agree with results and recommendations: Patient Pain: Pain Assessment Pain Assessment: No/denies pain End of Session: Start Time:No data recorded Stop Time: No data recorded Time Calculation:No data recorded Charges: No data recorded SLP visit diagnosis: SLP Visit Diagnosis: Dysphagia, oropharyngeal phase (R13.12); Dysarthria and anarthria (R47.1) Past Medical History: Past Medical History: Diagnosis Date  Alcohol dependence (HCC)   Chronic calcific pancreatitis (HCC)   CVA (cerebral vascular accident) (HCC) 01/24/2022  Diabetes mellitus without complication (HCC)   Hyperlipidemia   Hypertension   Melanoma  (HCC) 2013  metastatic - resected and cured with chemo/immuno tx  Pancreatic cyst-mass?   Polyneuropathy  Past Surgical History: Past Surgical History: Procedure Laterality Date  BIOPSY  02/03/2022  Procedure: BIOPSY;  Surgeon: Iva Boop, MD;  Location: [facility] ENDOSCOPY;  Service: Gastroenterology;;  BRAIN SURGERY  2015  to check for possible malignancy from melanoma, result were scar tissue  COLONOSCOPY WITH PROPOFOL  N/A 02/03/2022  Procedure: COLONOSCOPY WITH PROPOFOL;  Surgeon: Iva Boop, MD;  Location: [facility] ENDOSCOPY;  Service: Gastroenterology;  Laterality: N/A;  ESOPHAGOGASTRODUODENOSCOPY (EGD) WITH PROPOFOL N/A 02/03/2022  Procedure: ESOPHAGOGASTRODUODENOSCOPY (EGD) WITH PROPOFOL;  Surgeon: Iva Boop, MD;  Location: [facility] ENDOSCOPY;  Service: Gastroenterology;  Laterality: N/A;  melanoma exicison    right leg Jeannie Done, M.A., CCC-SLP Yetta Barre 02/12/2024, 9:42 AM  Recent Labs    02/12/24 0516 02/13/24 0517  WBC 6.4 5.5  HGB 12.1* 11.5*  HCT 35.2* 33.6*  PLT 275 256   No results for input(s): "NA", "K", "CL", "CO2", "GLUCOSE", "BUN", "CREATININE", "CALCIUM" in the last 72 hours.   Intake/Output Summary (Last 24 hours) at 02/13/2024 0910 Last data filed at 02/13/2024 0820 Gross per 24 hour  Intake 531 ml  Output 725 ml  Net -194 ml        Physical Exam: Vital Signs Blood pressure 131/64, pulse (!) 56, temperature 97.6 F (36.4 C), resp. rate 18, height 5\' 4"  (1.626 m), weight 70 kg, SpO2 98%. General: awake, alert, appropriate, sitting up in bed; NAD HENT: conjugate gaze; oropharynx moist CV: Bradycardic Pulmonary: CTA B/L; no W/R/R- good air movement GI: soft, NT, ND, (+)BS Psychiatric: appropriate- more interactive Neurological: Ox3  Skin: Clean and intact without signs of breakdown Neuro:     Mental Status: AAOx3, memory intact, fund of knowledge appropriate Speech/Languate: Naming and repetition intact, moderate dysarthria, follows simple commands    CRANIAL NERVES: II: PERRL. Visual fields full III, IV, VI: EOM intact, no gaze preference or deviation V: normal sensation bilaterally VII: Mild R facial weakness VIII: normal hearing to speech IX, X: normal palatal elevation XI: 4/5 shoulder shrug on the right XII: Tongue deviates to RIght     MOTOR: RUE: 4/5 Deltoid, 4/5 Biceps, 4/5 Triceps,4/5 Grip LUE: 5/5 Deltoid, 5/5 Biceps, 5/5 Triceps, 5/5 Grip RLE: HF 4/5, KE 4+/5, ADF 4+/5, APF 4+/5 LLE: HF 4+/5, KE 5/5, ADF 5/5, APF 5/5, stable 3/12     REFLEXES No ankle clonus   SENSORY: Altered sensation to LT RUE  Altered sensation b/l Feet- chronic neuropathy   Coordination: Normal finger to nose ataxia on R   MSK: R shoulder pain with PROM- chronic    Assessment/Plan: 1. Functional deficits which require 3+ hours per day of interdisciplinary therapy in a comprehensive inpatient rehab setting. Physiatrist is providing close team supervision and 24 hour management of active medical problems listed below. Physiatrist and rehab team continue to assess barriers to discharge/monitor patient progress toward functional and medical goals  Care Tool:  Bathing    Body parts bathed by patient: Right arm, Left arm, Chest, Abdomen, Front perineal area, Buttocks, Right upper leg, Face, Left lower leg, Right lower leg, Left upper leg         Bathing assist Assist Level: Contact Guard/Touching assist     Upper Body Dressing/Undressing Upper body dressing   What is the patient wearing?: Pull over shirt    Upper body assist Assist Level: Set up assist    Lower Body Dressing/Undressing Lower body dressing      What is the patient wearing?: Underwear/pull up, Pants     Lower body assist Assist for lower body dressing: Contact Guard/Touching assist     Toileting Toileting    Toileting assist Assist for toileting: Contact Guard/Touching assist     Transfers Chair/bed transfer  Transfers assist     Chair/bed  transfer assist level: Minimal Assistance - Patient >  75%     Locomotion Ambulation   Ambulation assist      Assist level: Minimal Assistance - Patient > 75% Assistive device:  (hurrycane) Max distance: 200'   Walk 10 feet activity   Assist     Assist level: Minimal Assistance - Patient > 75% Assistive device:  (hurrycane)   Walk 50 feet activity   Assist    Assist level: Minimal Assistance - Patient > 75% Assistive device:  (hurrycane)    Walk 150 feet activity   Assist    Assist level: Minimal Assistance - Patient > 75% Assistive device:  (hurrycane)    Walk 10 feet on uneven surface  activity   Assist     Assist level: Minimal Assistance - Patient > 75%     Wheelchair     Assist Is the patient using a wheelchair?: Yes Type of Wheelchair: Manual    Wheelchair assist level: Dependent - Patient 0% Max wheelchair distance: 150'    Wheelchair 50 feet with 2 turns activity    Assist        Assist Level: Dependent - Patient 0%   Wheelchair 150 feet activity     Assist      Assist Level: Dependent - Patient 0%   Blood pressure 131/64, pulse (!) 56, temperature 97.6 F (36.4 C), resp. rate 18, height 5\' 4"  (1.626 m), weight 70 kg, SpO2 98%.   Medical Problem List and Plan: 1. Functional deficits secondary to acute CVA  left corona radiata, left lentiform nucleus and posterior limb of left internal capsule likely due to large vessel disease             -patient may  shower             -ELOS/Goals: 7-10 days, PT/OT/SLP min A/sup            Continue CIR PT and OT  D3 started  2.  Impaired mobility: lovenox d/ced given worsening anemia, SCDs ordered.             -antiplatelet therapy: DAPT X 3 months followed by Plavix alone.  3. Pain Management: Tylenol prn. Add voltaren gel to right hip and left knee.  4. Mood/Behavior/Sleep: LCSW to follow for evaluation and support.              -antipsychotic agents: N/A 5.  Neuropsych/cognition: This patient is capable of making decisions on his own behalf. 6. Skin/Wound Care: Routine pressure relief measures.  7. Fluids/Electrolytes/Nutrition: Monitor I/O. Check CMET IN AM. HTN: Monitor BP TID--on Lotensin.  8. HTN: Monitor BP TID--continue Lisinopri/amlodipine. Avoid hypoperfusion.           Magnesium supplement started HS   9.  T2DM with neuropathy: Hgb A1C- 8.8. Monitor BS ac/hs and use SSI for elevated BS.              --was on jardiance at home-->will hold due to nectar liquids and concerns for dehydration   3/8-3/9 CBGs 105-155- con't regimen CBG (last 3)  Recent Labs (last 2 labs)       Recent Labs    02/07/24 2118 02/08/24 0652 02/08/24 1133  GLUCAP 155* 115* 177*        10. Hypokalemia: Improved after supplement. Recheck in am.  3/8- K+ 3.7 11. Hypomagnesemia: Improved after supplement. Recheck in am. 3/8- Last Mg 1.7- wasn't done this AM- will draw Monday  12. BPH?: On flomax.  13. Melanoma with lung/brain mets: Treated with immunotherapy and Gamma knife.  In remission. F/U outpatient [facility] specialist.  14. Hx of chronic calcific pancreatitis: Has been sober for 2 years.  15. Hyperlipidemia: LDL at goal.  Lipitor 20mg  was started.  16. Facial rash.  Suspect this is seborrheic dermatitis. He uses triamcinolone cream at home for this, has not tried any other creams previously.             -Will start ketoconazole cream, triamcinolone cream as needed 17. Hx of GI bleed 2023             -continue PPI  18. Dysphagia             -Continue SLP   Continue Nectar thick liquids- heart healthy diet  Discussed results of MBS  19. Suboptimal potassium: 20klor ordered  20. Polypharmacy: benadryl and compazine d/ced to minimize risk of ulcer formation with coadministration with potassium  21. Anemia: lovenox d/ced, repeat CBC ordered for tomorrow  22. Bradycardia: continue to monitor HR TID     LOS: 5 days A FACE TO FACE EVALUATION WAS  PERFORMED  Clint Bolder P Christionna Poland 02/13/2024, 9:10 AM

## 2024-02-13 NOTE — Progress Notes (Signed)
 Physical Therapy Session Note  Patient Details  Name: Kenneth [name] MRN: [id number] Date of Birth: [birth year]  Today's Date: 02/13/2024 PT Individual Time: 1415-1515 PT Individual Time Calculation (min): 60 min   Short Term Goals: Week 1:  PT Short Term Goal 1 (Week 1): STGs = LTGs  Skilled Therapeutic Interventions/Progress Updates:      Pt sitting in recliner with his sister present in the room. Sister brought in a new rollator that was still in the box - asking for assistance in putting it together for the patient.   Sit<>Stand to rollator with supervision. Pt ambulates with supervision and rollator ~125ft to main rehab gym.   Putting together rollator from box - task made therapeutic with patient reading instructions and following written directions. Worked on [employer], spatial awareness, and orientation while completing at a sitting level for safety. Pt able to screw nuts/bolts with increased time and effort. Able to problem solve issues or problems encountered while building the rollator. Rollator adjusted to fit and patient demo'd to ensure safety and comfort.   Finished session on Nustep x7 minutes at workload x8 for the first 4 minutes and workled 6 for the last 3 minutes. Using BUE/BLE to target whole body strengthening and conditioning. Achieved > 400steps total.   Returned to his room and patient left sitting upright in wheelchair. Family appreciative of putting together DME. All needs met. With seat belt alarm on.   Therapy Documentation Precautions:  Precautions Precautions: Fall Recall of Precautions/Restrictions: Intact Restrictions Weight Bearing Restrictions Per Provider Order: No General:     Therapy/Group: Individual Therapy  [name] 02/13/2024, 7:37 AM

## 2024-02-13 NOTE — Progress Notes (Signed)
 Physical Therapy Session Note  Patient Details  Name: Kenneth [name] MRN: [id number] Date of Birth: November 19, 1961  Today's Date: 02/13/2024 PT Individual Time: 0952-1030 PT Individual Time Calculation (min): 38 min   Short Term Goals: Week 1:  PT Short Term Goal 1 (Week 1): STGs = LTGs  Skilled Therapeutic Interventions/Progress Updates:     Pt received seated in recliner and agreeable to therapy session. Pt has no c/o pain at this time.   Pt states previous rollator had issue with brakes, SPT and PT retrieved new rollator. Pt performed gait training to therapy gym with rollator and CGA, demonstrating mild R hip hike and circumduction for increased foot clearance, and decreased R stance time. One mild, self-recovered LOB d/t decreased R foot clearance.  Pt performed 2x2 6" hurdles with hurry-cane and minA, had difficulty with R foot clearance for first two d/t inadequate L stance time and R knee flexion. Pt ambulated with hurry-cane to stairs and performed 2x8 toe taps on step with BUE support at railing and verbal and tactile cues to avoid circumduction and increased knee flexion, seated rest break between each set.   Pt ambulated back to room with rollator and demonstrated improved R knee flexion during swing phase to allow for better R foot clearance and decreased R hip flexion and circumduction. Pt left seated in recliner with seat belt alarm on and all needs in reach.  Therapy Documentation Precautions:  Precautions Precautions: Fall Recall of Precautions/Restrictions: Intact Restrictions Weight Bearing Restrictions Per Provider Order: No General: PT Amount of Missed Time (min): 7 Minutes PT Missed Treatment Reason: Other (Comment) (delay in service)     Therapy/Group: Individual Therapy  [name] 02/13/2024, 3:35 PM

## 2024-02-13 NOTE — Progress Notes (Signed)
 Patient ID: Kenneth [name], male   DOB: [birth year], 63 y.o.   MRN: [id number] Met with pt and sister who is present to give them the team conference update regarding goals of supervision-mod/I and discharge date 3/15. Both fele he is doing well and sister has purchased the equipment he needs. Discussed OP and he lives close to [facility] will send the referral to them and they will reach out to schedule the follow up appointments. Both pleased with his progress here.

## 2024-02-13 NOTE — Progress Notes (Signed)
 Occupational Therapy Session Note  Patient Details  Name: DIVON KRABILL MRN: [id number] Date of Birth: 01/18/1961  Today's Date: 02/13/2024 OT Individual Time: 4098-1191 OT Individual Time Calculation (min): 43 min    Short Term Goals: Week 1:  OT Short Term Goal 1 (Week 1): Pt will demonstrate improved RLE coordination to ambulate to bathroom with LRAD with supervision. OT Short Term Goal 2 (Week 1): Pt will demonstrate improved R FMC to open lunch tray items and cut his food independently.  Skilled Therapeutic Interventions/Progress Updates:     Pt received sitting up in recliner presenting to be in good spirits receptive to skilled OT session reporting 0/10 pain- OT offering intermittent rest breaks, repositioning, and therapeutic support to optimize participation in therapy session. Focus this session BADL retraining with focus on safety awareness, activity tolerance, and R UE functional use. Engaged pt in completing functional mobility to bathroom using RW for endurance training with Pt able to complete ~21ft with CGA provided fro balance. Pt transitioned to standard toilet using RW and grab bars and completed 3/3 toileting tasks CGA following continent B&B- documented in flowsheets. Ambulatory transfer to TTB positioned in walk-in shower min A for balance and mod verbal cues for technique and R LE placement. Pt able to doff clothing in seated position with supervision. Pt sat on TTB during entirety of shower for increased safety and energy conservation. Pt able to complete U/LB bathing with close supervision completing lateral leans to wash buttocks and utilizing R UE at a diminished level with supervision. Following shower, Pt able to dry self in seated position with supervision. Ambulated from bathroom > recliner using RW with CGA provided for balance and completed U/LB dressing seated in recliner. Pt able to donn shirt with supervision and pants with CGA when standing to bring pants to  waist +increased time. Pt able to donn bilateral socks and shoes including tieing shoes with supervision +increased time. Pt presenting with improved R side attention and R UE functional use. Pt was left resting in recliner with call bell in reach, seatbelt alarm on, and all needs met.    Therapy Documentation Precautions:  Precautions Precautions: Fall Recall of Precautions/Restrictions: Intact Restrictions Weight Bearing Restrictions Per Provider Order: No   Therapy/Group: Individual Therapy  Clide Deutscher 02/13/2024, 9:19 AM

## 2024-02-14 DIAGNOSIS — I639 Cerebral infarction, unspecified: Secondary | ICD-10-CM | POA: Diagnosis not present

## 2024-02-14 LAB — BASIC METABOLIC PANEL
Anion gap: 7 (ref 5–15)
BUN: 11 mg/dL (ref 8–23)
CO2: 24 mmol/L (ref 22–32)
Calcium: 8.9 mg/dL (ref 8.9–10.3)
Chloride: 109 mmol/L (ref 98–111)
Creatinine, Ser: 1.06 mg/dL (ref 0.61–1.24)
GFR, Estimated: >60 mL/min (ref >60)
Glucose, Bld: 104 mg/dL — ABNORMAL HIGH (ref 70–99)
Potassium: 3.5 mmol/L (ref 3.5–5.1)
Sodium: 140 mmol/L (ref 135–145)

## 2024-02-14 LAB — CBC WITH DIFFERENTIAL/PLATELET
Abs Immature Granulocytes: 0.01 10*3/uL (ref 0.00–0.07)
Basophils Absolute: 0 10*3/uL (ref 0.0–0.1)
Basophils Relative: 1 %
Eosinophils Absolute: 0.4 10*3/uL (ref 0.0–0.5)
Eosinophils Relative: 7 %
HCT: 38.7 % — ABNORMAL LOW (ref 39.0–52.0)
Hemoglobin: 13.2 g/dL (ref 13.0–17.0)
Immature Granulocytes: 0 %
Lymphocytes Relative: 35 %
Lymphs Abs: 1.8 10*3/uL (ref 0.7–4.0)
MCH: 29.2 pg (ref 26.0–34.0)
MCHC: 34.1 g/dL (ref 30.0–36.0)
MCV: 85.6 fL (ref 80.0–100.0)
Monocytes Absolute: 0.7 10*3/uL (ref 0.1–1.0)
Monocytes Relative: 13 %
Neutro Abs: 2.4 10*3/uL (ref 1.7–7.7)
Neutrophils Relative %: 44 %
Platelets: 294 10*3/uL (ref 150–400)
RBC: 4.52 MIL/uL (ref 4.22–5.81)
RDW: 13.5 % (ref 11.5–15.5)
WBC: 5.3 10*3/uL (ref 4.0–10.5)
nRBC: 0 % (ref 0.0–0.2)

## 2024-02-14 LAB — GLUCOSE, CAPILLARY
Glucose-Capillary: 98 mg/dL (ref 70–99)
Glucose-Capillary: 220 mg/dL — ABNORMAL HIGH (ref 70–99)

## 2024-02-14 MED ORDER — VITAMIN D 25 MCG (1000 UNIT) PO TABS
3000.0000 [IU] | ORAL_TABLET | Freq: Every day | ORAL | Status: DC
  Administered 2024-02-15: 3000 [IU] via ORAL
  Filled 2024-02-14: qty 3

## 2024-02-14 MED ORDER — METFORMIN HCL 500 MG PO TABS
500.0000 mg | ORAL_TABLET | Freq: Every day | ORAL | Status: DC
  Administered 2024-02-14 – 2024-02-15 (×2): 500 mg via ORAL
  Filled 2024-02-14 (×2): qty 1

## 2024-02-14 MED ORDER — POTASSIUM CHLORIDE 20 MEQ PO PACK
40.0000 meq | PACK | Freq: Once | ORAL | Status: AC
  Administered 2024-02-14: 40 meq via ORAL
  Filled 2024-02-14: qty 2

## 2024-02-14 NOTE — Plan of Care (Signed)
  Problem: Consults Goal: RH STROKE PATIENT EDUCATION Description: See Patient Education module for education specifics  Outcome: Progressing   Problem: RH BLADDER ELIMINATION Goal: RH STG MANAGE BLADDER WITH ASSISTANCE Description: STG Manage Bladder With toileting Assistance Outcome: Progressing Flowsheets (Taken 02/14/2024 1345) STG: Pt will manage bladder with assistance: 5-Supervision/cueing Goal: RH STG MANAGE BLADDER WITH MEDICATION WITH ASSISTANCE Description: STG Manage Bladder With Medication With mod I Assistance. Outcome: Progressing   Problem: RH SKIN INTEGRITY Goal: RH STG SKIN FREE OF INFECTION/BREAKDOWN Description: Manage skin with cues/reminders assistance Outcome: Progressing Goal: RH STG MAINTAIN SKIN INTEGRITY WITH ASSISTANCE Description: STG Maintain Skin Integrity With min Assistance. Outcome: Progressing   Problem: RH SAFETY Goal: RH STG ADHERE TO SAFETY PRECAUTIONS W/ASSISTANCE/DEVICE Description: STG Adhere to Safety Precautions With cues  Assistance/Device. Outcome: Progressing   Problem: RH PAIN MANAGEMENT Goal: RH STG PAIN MANAGED AT OR BELOW PT'S PAIN GOAL Description: < 4 with prns Outcome: Progressing

## 2024-02-14 NOTE — Progress Notes (Signed)
 Physical Therapy Session Note  Patient Details  Name: Kenneth [name] MRN: [id number] Date of Birth: Aug 12, 1961  Today's Date: 02/14/2024 PT Individual Time: 0920-1030 PT Individual Time Calculation (min): 70 min   Short Term Goals: Week 1:  PT Short Term Goal 1 (Week 1): STGs = LTGs  Skilled Therapeutic Interventions/Progress Updates:      Pt sitting in recliner to start - agreeable to therapy. No reports of pain but requests to toilet before leaving his room. Sit<>stand to rollator with supervision assist. Ambulates with supervision and rollator into the bathroom and he sits on toilet for bladder void.   Ambulated with supervision and rollator to main rehab gym, ~171ft - cues for upright posture and forward gaze, as well as increasing his proximity to the rollator while ambulating.   Completed standing there-ex for balance and UE strengthening. CGA for safety: -1x12 bicep curls -1x12 arm punches -1x12 shlder shrugs -1x12 rows -1x12 shoulder press -1x12 lateral raises -1x8 front shoulder raises -1x10 chest fly -2x10 weighted stands (goblet squats) with 10# dumbbell *used 4# dumbbell for all exercises in his LUE. Adapted some of these for body weight only for his RUE (shoulder press, lateral raises, front shoulder raise arm punches)  Pt instructed in weighted carries (4# bilaterally, 10# unilaterally) for ambulation - CGA for safety with cues to reduce lateral trunk leans for compensation.   Finished session on Nustep for x12 minutes at L6 resistance using BUE/BLE combination to target whole body strengthening and endurance. Achieved a total of 720 steps as he maintained a cadence > 50spm.   Pt concluded session in his recliner with the seat belt alarm on. All his needs met.    Therapy Documentation Precautions:  Precautions Precautions: Fall Recall of Precautions/Restrictions: Intact Restrictions Weight Bearing Restrictions Per Provider Order:  No General:    Therapy/Group: Individual Therapy  [name] 02/14/2024, 7:54 AM

## 2024-02-14 NOTE — Progress Notes (Signed)
 PROGRESS NOTE   Subjective/Complaints: No new complaints this morning Hgb normalized Discussed starting metformin for elevated CBGs  ROS:   Pt denies SOB, abd pain, CP, N/V/C/D, and vision changes    Objective:   No results found.  Recent Labs    02/13/24 0517 02/14/24 0543  WBC 5.5 5.3  HGB 11.5* 13.2  HCT 33.6* 38.7*  PLT 256 294   Recent Labs    02/14/24 0543  NA 140  K 3.5  CL 109  CO2 24  GLUCOSE 104*  BUN 11  CREATININE 1.06  CALCIUM 8.9     Intake/Output Summary (Last 24 hours) at 02/14/2024 1001 Last data filed at 02/14/2024 0743 Gross per 24 hour  Intake 356 ml  Output 1300 ml  Net -944 ml        Physical Exam: Vital Signs Blood pressure (!) 144/74, pulse (!) 58, temperature 97.6 F (36.4 C), temperature source Oral, resp. rate 17, height 5\' 4"  (1.626 m), weight 70 kg, SpO2 99%. General: awake, alert, appropriate, sitting up in bed; NAD HENT: conjugate gaze; oropharynx moist CV: Bradycardic Pulmonary: CTA B/L; no W/R/R- good air movement GI: soft, NT, ND, (+)BS Psychiatric: appropriate- more interactive Neurological: Ox3  Skin: Clean and intact without signs of breakdown Neuro:     Mental Status: AAOx3, memory intact, fund of knowledge appropriate Speech/Languate: Naming and repetition intact, moderate dysarthria, follows simple commands   CRANIAL NERVES: II: PERRL. Visual fields full III, IV, VI: EOM intact, no gaze preference or deviation V: normal sensation bilaterally VII: Mild R facial weakness VIII: normal hearing to speech IX, X: normal palatal elevation XI: 4/5 shoulder shrug on the right XII: Tongue deviates to RIght     MOTOR: RUE: 4/5 Deltoid, 4/5 Biceps, 4/5 Triceps,4/5 Grip LUE: 5/5 Deltoid, 5/5 Biceps, 5/5 Triceps, 5/5 Grip RLE: HF 4/5, KE 4+/5, ADF 4+/5, APF 4+/5 LLE: HF 4+/5, KE 5/5, ADF 5/5, APF 5/5, stable 3/13     REFLEXES No ankle clonus    SENSORY: Altered sensation to LT RUE  Altered sensation b/l Feet- chronic neuropathy   Coordination: Normal finger to nose ataxia on R   MSK: R shoulder pain with PROM- chronic    Assessment/Plan: 1. Functional deficits which require 3+ hours per day of interdisciplinary therapy in a comprehensive inpatient rehab setting. Physiatrist is providing close team supervision and 24 hour management of active medical problems listed below. Physiatrist and rehab team continue to assess barriers to discharge/monitor patient progress toward functional and medical goals  Care Tool:  Bathing    Body parts bathed by patient: Right arm, Left arm, Chest, Abdomen, Front perineal area, Buttocks, Right upper leg, Face, Left lower leg, Right lower leg, Left upper leg         Bathing assist Assist Level: Contact Guard/Touching assist     Upper Body Dressing/Undressing Upper body dressing   What is the patient wearing?: Pull over shirt    Upper body assist Assist Level: Set up assist    Lower Body Dressing/Undressing Lower body dressing      What is the patient wearing?: Underwear/pull up, Pants     Lower body assist Assist for lower  body dressing: Contact Guard/Touching assist     Toileting Toileting    Toileting assist Assist for toileting: Contact Guard/Touching assist     Transfers Chair/bed transfer  Transfers assist     Chair/bed transfer assist level: Minimal Assistance - Patient > 75%     Locomotion Ambulation   Ambulation assist      Assist level: Minimal Assistance - Patient > 75% Assistive device:  (hurrycane) Max distance: 200'   Walk 10 feet activity   Assist     Assist level: Minimal Assistance - Patient > 75% Assistive device:  (hurrycane)   Walk 50 feet activity   Assist    Assist level: Minimal Assistance - Patient > 75% Assistive device:  (hurrycane)    Walk 150 feet activity   Assist    Assist level: Minimal Assistance -  Patient > 75% Assistive device:  (hurrycane)    Walk 10 feet on uneven surface  activity   Assist     Assist level: Minimal Assistance - Patient > 75%     Wheelchair     Assist Is the patient using a wheelchair?: Yes Type of Wheelchair: Manual    Wheelchair assist level: Dependent - Patient 0% Max wheelchair distance: 150'    Wheelchair 50 feet with 2 turns activity    Assist        Assist Level: Dependent - Patient 0%   Wheelchair 150 feet activity     Assist      Assist Level: Dependent - Patient 0%   Blood pressure (!) 144/74, pulse (!) 58, temperature 97.6 F (36.4 C), temperature source Oral, resp. rate 17, height 5\' 4"  (1.626 m), weight 70 kg, SpO2 99%.   Medical Problem List and Plan: 1. Functional deficits secondary to acute CVA  left corona radiata, left lentiform nucleus and posterior limb of left internal capsule likely due to large vessel disease             -patient may  shower             -ELOS/Goals: 7-10 days, PT/OT/SLP min A/sup            Continue CIR PT and OT  D3 started  2.  Impaired mobility: lovenox d/ced given worsening anemia, SCDs ordered.             -antiplatelet therapy: DAPT X 3 months followed by Plavix alone.  3. Pain Management: Tylenol prn. Add voltaren gel to right hip and left knee.  4. Mood/Behavior/Sleep: LCSW to follow for evaluation and support.              -antipsychotic agents: N/A 5. Neuropsych/cognition: This patient is capable of making decisions on his own behalf. 6. Skin/Wound Care: Routine pressure relief measures.  7. Fluids/Electrolytes/Nutrition: Monitor I/O. Check CMET IN AM. HTN: Monitor BP TID--on Lotensin.  8. HTN: Monitor BP TID--continue Lisinopri/amlodipine. Avoid hypoperfusion.           Magnesium supplement started HS   9.  T2DM with neuropathy: Hgb A1C- 8.8. Monitor BS ac/hs and use SSI for elevated BS.              Metformin started CBG (last 3)  Recent Labs (last 2 labs)        Recent Labs    02/07/24 2118 02/08/24 0652 02/08/24 1133  GLUCAP 155* 115* 177*        10. Hypokalemia: Improved after supplement. Recheck in am.  3/8- K+ 3.7 11. Hypomagnesemia: Improved after supplement.  Recheck in am. 3/8- Last Mg 1.7- wasn't done this AM- will draw Monday  12. BPH?: On flomax.  13. Melanoma with lung/brain mets: Treated with immunotherapy and Gamma knife. In remission. F/U outpatient [facility] specialist.  14. Hx of chronic calcific pancreatitis: Has been sober for 2 years.  15. Hyperlipidemia: LDL at goal.  Lipitor 20mg  was started.  16. Facial rash.  Suspect this is seborrheic dermatitis. He uses triamcinolone cream at home for this, has not tried any other creams previously.             -Will start ketoconazole cream, triamcinolone cream as needed 17. Hx of GI bleed 2023             -continue PPI  18. Dysphagia             -Continue SLP   Continue Nectar thick liquids- heart healthy diet  Discussed results of MBS  Water protocol ordered  19. Suboptimal potassium: klor 40 ordered 3/13  20. Polypharmacy: benadryl and compazine d/ced to minimize risk of ulcer formation with coadministration with potassium  21. Anemia: lovenox d/ced, repeat CBC ordered for tomorrow  22. Bradycardia: continue to monitor HR TID     LOS: 6 days A FACE TO FACE EVALUATION WAS PERFORMED  Worthy Boschert P Afiya Ferrebee 02/14/2024, 10:01 AM

## 2024-02-14 NOTE — Patient Care Conference (Signed)
 Inpatient RehabilitationTeam Conference and Plan of Care Update Date: 02/13/2024   Time: 11:15 AM    Patient Name: Kenneth [name]      Medical Record Number: [id number]  Date of Birth: 07-23-1961 Sex: Male         Room/Bed: 4M06C/4M06C-01 Payor Info: Payor: [place] MEDICAID PREPAID HEALTH PLAN / Plan: [place] MEDICAID [employer] / Product Type: *No Product type* /    Admit Date/Time:  02/08/2024  2:55 PM  Primary Diagnosis:  Stroke (cerebrum) [facility])  Hospital Problems: Principal Problem:   Stroke (cerebrum) [facility])    Expected Discharge Date: Expected Discharge Date: 02/16/24  Team Members Present: Physician leading conference: Dr. Sula Soda Social Worker Present: Dossie Der, LCSW Nurse Present: Chana Bode, RN PT Present: Wynelle Link, PT OT Present: Candee Furbish, OT SLP Present: Jeannie Done, SLP PPS Coordinator present : Fae Pippin, SLP     Current Status/Progress Goal Weekly Team Focus  Bowel/Bladder      Continent of bowel and bladder          Swallow/Nutrition/ Hydration   regular/nectar thick liquids   diet upgrade, supervision  initiation of water protocol, continuation of pharyngeal strengthening exercises    ADL's   UB BADLs supervision, LB BADLs and toileting CGA-min A using rollator, fucntional toilet and shower transfers CGA using rollaotr   mod I-sup   R UE NMRE, dynamic balance, functional congition, safety awareness, EC, d/c planning, activity tolerance    Mobility   CGA overall for functional mobility with the use of a rollator. light minA needed if no AD or UE support due to balance impairments. R sided weakness, decreased activity tolerance. BERG 16/56 and 495ft wtih rollator   Supervision  balance training, activity tolerance, R sided NMR, DC planning    Communication   min dysarthria (~90% intelligible)   supervision   use of speech intelligibility strategies at the conversation level    Safety/Cognition/  Behavioral Observations               Pain      Chronic knee pain managed with Voltaren gel    Pain < 4 with prns    Assess pain q shift and effectiveness of prns  Skin      N/a           Discharge Planning:  Home with your sister she works out of the home three days and from home for two days, another sister can come over and check on him. if pt can get to mod/i would be safer since at times may be alone   Team Discussion: Patient post CVA with balance issues and swallowing deficits.  Patient on target to meet rehab goals: yes, currently needs CGA for sit - stand using a RW and able to ambulate using a RW wit CGA - supervision assist. Requires CGA for steps. Able to ambulate 400' for a 6 min walk but BERG score is 16/56.  Needs supervision for upper body care and CGA for lower body care and toileting. Needs CGA for shower transfers.  Patient is silently aspirating thin liquids. MBS scheduled 02/12/24; continue Nectar thickened liquids and H2O protocol.  *See Care Plan and progress notes for long and short-term goals.   Revisions to Treatment Plan:  Neuro re-educ for balance and right upper extremity Swallowing exercises and strategies    Teaching Needs: Safety, swallow strategies, dietary modification, medications, transfers, toileting, etc.   Current Barriers to Discharge: Decreased caregiver support and Home enviroment  access/layout  Possible Resolutions to Barriers: Family education DME: BSC, Rollator OP Podiatry consult for toe OP follow up services     Medical Summary Current Status: overweight, bradycardia, dysphagia, stroke, HTN  Barriers to Discharge: Medical stability  Barriers to Discharge Comments: overweight, bradycardia, dysphagia, stroke, HTN Possible Resolutions to [employer] Focus: provide dietary education, continue to monitor HR TID, continue water protocol, continue aspirin/plavix/statin/B complex, continue benzapril   Continued Need for  Acute Rehabilitation Level of Care: The patient requires daily medical management by a physician with specialized training in physical medicine and rehabilitation for the following reasons: Direction of a multidisciplinary physical rehabilitation program to maximize functional independence : Yes Medical management of patient stability for increased activity during participation in an intensive rehabilitation regime.: Yes Analysis of laboratory values and/or radiology reports with any subsequent need for medication adjustment and/or medical intervention. : Yes   I attest that I was present, lead the team conference, and concur with the assessment and plan of the team.   Chana Bode B 02/14/2024, 10:03 AM

## 2024-02-14 NOTE — Progress Notes (Signed)
 Speech Language Pathology Daily Session Note  Patient Details  Name: Kenneth [name] MRN: [id number] Date of Birth: 07-Sep-1961  Today's Date: 02/14/2024 SLP Individual Time: 1027-2536 SLP Individual Time Calculation (min): 53 min  Short Term Goals: Week 1: SLP Short Term Goal 1 (Week 1): STG = LTG due to ELOS  Skilled Therapeutic Interventions: SLP conducted skilled therapy targeting communication and swallowing goals. Upon SLP entry, patient receiving medications whole with LIQUID, despite prior day of nursing education. Provided reeducation to nursing staff re: importance of administering medications whole in puree given silent aspiration. Patient presented with frequent coughing/throat clearing towards beginning of session, likely due to delayed response of suspected airway penetrants from medication administration. Patient completed oral care, then participated in thin liquids via water protocol parameters. Patient exhibited immediate cough x2 across ~5 trials indicating improving sensation. Recommend continuation of regular/NTL diet with medications administered whole IN PUREE. Educated patient on importance of taking medications in puree at home. In remaining minutes of session, facilitated conversation level task to target utilization of speech intelligibility strategies during moments of increased cognitive load. Patient benefited from supervision to utilize strategies and was 96% intelligible throughout duration of activity. Patient was left in chair with call bell in reach and chair alarm set. SLP will continue to target goals per plan of care.       Pain Pain Assessment Pain Scale: 0-10 Pain Score: 0-No pain  Therapy/Group: Individual Therapy  Jeannie Done, M.A., CCC-SLP  Yetta Barre 02/14/2024, 9:04 AM

## 2024-02-14 NOTE — Group Note (Signed)
 Patient Details Name: Kenneth [name] MRN: [id number] DOB: [birth year] Today's Date: 02/14/2024  Time Calculation: OT Group Time Calculation OT Group Start Time: 1430 OT Group Stop Time: 1530 OT Group Time Calculation (min): 60 min      Group Description: Dance Group: Pt participated in dance group with an emphasis on social interaction, motor planning, increasing overall activity tolerance and bimanual tasks. All songs were selected by group members. Dance moves included AROM of BUE/BLE gross motor movements with an emphasis on building functional endurance.    Individual level documentation: Patient completed group from sitting level. Patientt needed supervision to complete various dance moves with OT providing visual model.  Patient able to create his own modifications during group.  Pain: Pain Assessment Pain Scale: 0-10 Pain Score: 0-No pain  Precautions:  Falls   Dayleen Beske P Woodson 02/14/2024, 4:01 PM

## 2024-02-15 ENCOUNTER — Other Ambulatory Visit (HOSPITAL_COMMUNITY): Payer: Self-pay

## 2024-02-15 DIAGNOSIS — I639 Cerebral infarction, unspecified: Secondary | ICD-10-CM | POA: Diagnosis not present

## 2024-02-15 LAB — MAGNESIUM: Magnesium: 1.5 mg/dL — ABNORMAL LOW (ref 1.7–2.4)

## 2024-02-15 LAB — GLUCOSE, CAPILLARY
Glucose-Capillary: 96 mg/dL (ref 70–99)
Glucose-Capillary: 161 mg/dL — ABNORMAL HIGH (ref 70–99)
Glucose-Capillary: 184 mg/dL — ABNORMAL HIGH (ref 70–99)

## 2024-02-15 LAB — BASIC METABOLIC PANEL
Anion gap: 6 (ref 5–15)
BUN: 11 mg/dL (ref 8–23)
CO2: 26 mmol/L (ref 22–32)
Calcium: 9 mg/dL (ref 8.9–10.3)
Chloride: 110 mmol/L (ref 98–111)
Creatinine, Ser: 1.47 mg/dL — ABNORMAL HIGH (ref 0.61–1.24)
GFR, Estimated: 54 mL/min — ABNORMAL LOW (ref >60)
Glucose, Bld: 152 mg/dL — ABNORMAL HIGH (ref 70–99)
Potassium: 3.5 mmol/L (ref 3.5–5.1)
Sodium: 142 mmol/L (ref 135–145)

## 2024-02-15 MED ORDER — MAGNESIUM OXIDE 400 MG PO TABS
200.0000 mg | ORAL_TABLET | Freq: Every day | ORAL | 0 refills | Status: DC
  Filled 2024-02-15: qty 30, 60d supply, fill #0

## 2024-02-15 MED ORDER — POTASSIUM CHLORIDE 20 MEQ PO PACK
40.0000 meq | PACK | Freq: Once | ORAL | Status: AC
  Administered 2024-02-15: 40 meq via ORAL
  Filled 2024-02-15: qty 2

## 2024-02-15 MED ORDER — LANCET DEVICE MISC
1.0000 | Freq: Three times a day (TID) | 0 refills | Status: DC
Start: 2024-02-15 — End: 2024-02-15

## 2024-02-15 MED ORDER — CLOPIDOGREL BISULFATE 75 MG PO TABS
75.0000 mg | ORAL_TABLET | Freq: Every day | ORAL | 0 refills | Status: DC
  Filled 2024-02-15: qty 30, 30d supply, fill #0

## 2024-02-15 MED ORDER — LANCET DEVICE MISC: 1.0000 | Freq: Three times a day (TID) | 0 refills | Status: DC

## 2024-02-15 MED ORDER — LINAGLIPTIN 5 MG PO TABS
5.0000 mg | ORAL_TABLET | Freq: Every day | ORAL | Status: DC
  Filled 2024-02-15: qty 1

## 2024-02-15 MED ORDER — ATORVASTATIN CALCIUM 20 MG PO TABS
20.0000 mg | ORAL_TABLET | Freq: Every day | ORAL | 0 refills | Status: DC
Start: 2024-02-15 — End: 2024-04-06
  Filled 2024-02-15: qty 30, 30d supply, fill #0

## 2024-02-15 MED ORDER — TAMSULOSIN HCL 0.4 MG PO CAPS: 0.4000 mg | ORAL_CAPSULE | Freq: Every day | ORAL | Status: DC

## 2024-02-15 MED ORDER — B COMPLEX-C PO TABS
1.0000 | ORAL_TABLET | Freq: Every day | ORAL | 0 refills | Status: AC
  Filled 2024-02-15: qty 30, 30d supply, fill #0

## 2024-02-15 MED ORDER — ASPIRIN 81 MG PO TBEC
81.0000 mg | DELAYED_RELEASE_TABLET | Freq: Every day | ORAL | 0 refills | Status: AC
  Filled 2024-02-15: qty 100, 100d supply, fill #0

## 2024-02-15 MED ORDER — LANCETS MISC. MISC: 1.0000 | Freq: Three times a day (TID) | 0 refills | Status: AC

## 2024-02-15 MED ORDER — BLOOD GLUCOSE MONITORING SUPPL DEVI: 1.0000 | Freq: Three times a day (TID) | 0 refills | Status: DC

## 2024-02-15 MED ORDER — KETOCONAZOLE 2 % EX CREA
TOPICAL_CREAM | Freq: Two times a day (BID) | CUTANEOUS | 0 refills | Status: AC
  Filled 2024-02-15: qty 15, 30d supply, fill #0

## 2024-02-15 MED ORDER — PANTOPRAZOLE SODIUM 20 MG PO TBEC
20.0000 mg | DELAYED_RELEASE_TABLET | Freq: Every day | ORAL | 0 refills | Status: DC
  Filled 2024-02-15: qty 30, 30d supply, fill #0

## 2024-02-15 MED ORDER — VITAMIN D3 25 MCG PO TABS
3000.0000 [IU] | ORAL_TABLET | Freq: Every day | ORAL | 0 refills | Status: AC
  Filled 2024-02-15: qty 90, 30d supply, fill #0

## 2024-02-15 MED ORDER — DICLOFENAC SODIUM 1 % EX GEL
2.0000 g | Freq: Four times a day (QID) | CUTANEOUS | 0 refills | Status: DC
  Filled 2024-02-15: qty 100, 30d supply, fill #0

## 2024-02-15 MED ORDER — BLOOD GLUCOSE TEST VI STRP
1.0000 | ORAL_STRIP | Freq: Three times a day (TID) | 0 refills | Status: AC
  Filled 2024-02-15: qty 100, 34d supply, fill #0

## 2024-02-15 MED ORDER — MAGNESIUM SULFATE 2 GM/50ML IV SOLN
2.0000 g | Freq: Once | INTRAVENOUS | Status: AC
  Administered 2024-02-15: 2 g via INTRAVENOUS
  Filled 2024-02-15: qty 50

## 2024-02-15 MED ORDER — TRIAMCINOLONE ACETONIDE 0.5 % EX CREA
TOPICAL_CREAM | Freq: Two times a day (BID) | CUTANEOUS | 0 refills | Status: AC | PRN
  Filled 2024-02-15: qty 30, 28d supply, fill #0

## 2024-02-15 MED ORDER — L-METHYLFOLATE-B6-B12 3-35-2 MG PO TABS
1.0000 | ORAL_TABLET | Freq: Every day | ORAL | 0 refills | Status: DC
  Filled 2024-02-15: qty 60, 60d supply, fill #0

## 2024-02-15 NOTE — Plan of Care (Signed)

## 2024-02-15 NOTE — Progress Notes (Signed)
 Speech Language Pathology Discharge Summary  Patient Details  Name: Kenneth [name] MRN: [id number] Date of Birth: 03-22-1961  Date of Discharge from SLP service:February 15, 2024  Today's Date: 02/15/2024 SLP Individual Time:  1312- 1400  48 minutes   Skilled Therapeutic Interventions:  SLP conducted skilled therapy session targeting dysphagia management and communication goals. Patient completed oropharyngeal swallowing exercises with modI. Participated in thin liquid boluses per water protocol demonstrating immediate cough after ~1/2 of boluses demonstrating continued improvement in swallow sensation. Trialed 1 sip of thin liquid with chin tuck, no immediate cough appreciated. During speech intelligibility tasks, patient utilized SLOP intelligibility strategies to achieve 100% intelligibility at the conversation level with modI. Patient was left in lowered bed with call bell in reach and bed alarm set. SLP will continue to target goals per plan of care.     Patient has met 4 of 4 long term goals.  Patient to discharge at overall Modified Independent level.  Reasons goals not met: n/a   Clinical Impression/Discharge Summary: Patient has made excellent progress towards therapy goals, meeting 4/4 long term goals set for duration of admission. Patient currently conducts pharyngeal strengthening exercises, SLOP speech clarity strategies, and safe swallowing strategies with modified independence, however continues to silently aspirate thin liquids per most recent MBS. Patient would benefit from continued ST services at next venue of care to monitor swallowing progress, conduct further imaging as appropriate, and manage diet upgrades. Patient and family education complete. SLP will sign off.    Care Partner:  Caregiver Able to Provide Assistance: Other (comment) (none needed)    Recommendation:  Outpatient SLP;Home Health SLP  Rationale for SLP Follow Up: Maximize swallowing safety   Equipment: n/a    Reasons for discharge: Discharged from hospital   Patient/Family Agrees with Progress Made and Goals Achieved: Yes   Kenneth [name], M.A., CCC-SLP  Kenneth [name] 02/15/2024, 9:09 AM

## 2024-02-15 NOTE — Discharge Summary (Signed)
 Physician Discharge Summary  Patient ID: Kenneth [name] MRN: [id number] DOB/AGE: April 01, 1961 63 y.o.  Admit date: 02/08/2024 Discharge date: 02/16/2024  Discharge Diagnoses:  Principal Problem:   Stroke (cerebrum) [facility]) Active Problems:   Type 2 diabetes mellitus (HCC)   Essential hypertension   History of melanoma   Peripheral neuropathy  Dysphagia  Dysarthria.   Discharged Condition: stable  Significant Diagnostic Studies: DG Swallowing Func-Speech Pathology Result Date: 02/12/2024 Table formatting from the original result was not included. Modified Barium Swallow Study Patient Details Name: Kenneth [name] MRN: [id number] Date of Birth: 1961/09/11 Today's Date: 02/12/2024 HPI/PMH: HPI: Kenneth [name] is a 63 y.o. male with medical history significant of hypertension, hyperlipidemia, gastritis, stroke, diabetes, neuropathy, metastatic melanoma in remission presenting with focal neurologic deficit characterized by right sided weakness and difficulty speaking. Two adjacent acute infarcts within the left corona radiata, left  lentiform nucleus and posterior limb of left internal capsule (individually measuring up to 2 cm). Clinical Impression: Clinical Impression: Pt exhibits improved moderate oropharyngeal dysphagia suspected to be secondary to cranial nerve deficits. He ultimately has adequate oral control and clearance, but swallow initiation consistently requires a gross collection of each bolus at the level of the vallecula and pyriform sinuses. Epiglottic inversion has improved resulting in improved pharyngeal efficiency and mistiming of the swallow now most significantly impacts safety. Thin liquids are silently aspirated (PAS 8) consistently during cup sips and trials of chin tuck strategy (3/4 attempts). Pt has a strong cough and when cued to do so, is able to clear a significant amount of aspirated material. Patient's overall airway protection has improved, as no significant airway  penetration/aspiration observed with nectar thick liquids. Recommend continuation of regular/NTL diet with patient no longer requiring chin tuck posture during drinking. SLP will continue trials of thin liquids at bedside and will need to repeat MBS prior to discharge given silent aspiration when patient appears to improve clinically with swallow timing overall.   SLP will plan to initiate water protocol to promote hydration in between meals. Administer medications whole with puree. Factors that may increase risk of adverse event in presence of aspiration Kenneth [name] & Clearance Kenneth [name] 2021): No data recorded Recommendations/Plan: Swallowing Evaluation Recommendations Swallowing Evaluation Recommendations Recommendations: PO diet PO Diet Recommendation: Regular; Mildly thick liquids (Level 2, nectar thick) Liquid Administration via: Spoon; Cup; No straw Medication Administration: Whole meds with puree Supervision: Patient able to self-feed; Intermittent supervision/cueing for swallowing strategies Swallowing strategies  : Minimize environmental distractions; Slow rate; Small bites/sips Postural changes: Position pt fully upright for meals Oral care recommendations: Oral care BID (2x/day) Caregiver Recommendations: Avoid jello, ice cream, thin soups, popsicles; Remove water pitcher Treatment Plan Treatment Plan Treatment recommendations: Therapy as outlined in treatment plan below Follow-up recommendations: Outpatient SLP Functional status assessment: Patient has had a recent decline in their functional status and demonstrates the ability to make significant improvements in function in a reasonable and predictable amount of time. Treatment frequency: Min 2x/week Treatment duration: 2 weeks Interventions: Aspiration precaution training; Compensatory techniques; Patient/family education; Trials of upgraded texture/liquids; Diet toleration management by SLP Recommendations Recommendations for follow up therapy are one component of a  multi-disciplinary discharge planning process, led by the attending physician.  Recommendations may be updated based on patient status, additional functional criteria and insurance authorization. Assessment: Orofacial Exam: Orofacial Exam Oral Cavity: Oral Hygiene: WFL Oral Cavity - Dentition: Adequate natural dentition Orofacial Anatomy: WFL Oral Motor/Sensory Function: Suspected cranial nerve impairment CN V - Trigeminal: Right motor impairment; Right  sensory impairment CN VII - Facial: Right motor impairment CN IX - Glossopharyngeal, CN X - Vagus: WFL CN XII - Hypoglossal: WFL Anatomy: Anatomy: Suspected cervical osteophytes Boluses Administered: Boluses Administered Boluses Administered: Thin liquids (Level 0); Mildly thick liquids (Level 2, nectar thick); Moderately thick liquids (Level 3, honey thick); Puree; Solid  Oral Impairment Domain: Oral Impairment Domain Lip Closure: No labial escape Tongue control during bolus hold: Cohesive bolus between tongue to palatal seal Bolus preparation/mastication: Timely and efficient chewing and mashing Bolus transport/lingual motion: Brisk tongue motion Oral residue: Complete oral clearance Location of oral residue : N/A Initiation of pharyngeal swallow : Pyriform sinuses  Pharyngeal Impairment Domain: Pharyngeal Impairment Domain Soft palate elevation: No bolus between soft palate (SP)/pharyngeal wall (PW) Laryngeal elevation: Complete superior movement of thyroid cartilage with complete approximation of arytenoids to epiglottic petiole Anterior hyoid excursion: Complete anterior movement Epiglottic movement: Complete inversion Laryngeal vestibule closure: Incomplete, narrow column air/contrast in laryngeal vestibule Pharyngeal stripping wave : Present - diminished Pharyngeal contraction (A/P view only): N/A Pharyngoesophageal segment opening: Complete distension and complete duration, no obstruction of flow Tongue base retraction: Trace column of contrast or air between  tongue base and PPW Pharyngeal residue: Trace residue within or on pharyngeal structures Location of pharyngeal residue: Valleculae  Esophageal Impairment Domain: No data recorded Pill: No data recorded Penetration/Aspiration Scale Score: Penetration/Aspiration Scale Score 1.  Material does not enter airway: Puree; Solid; Moderately thick liquids (Level 3, honey thick) 2.  Material enters airway, remains ABOVE vocal cords then ejected out: Mildly thick liquids (Level 2, nectar thick) 8.  Material enters airway, passes BELOW cords without attempt by patient to eject out (silent aspiration) : Thin liquids (Level 0) Compensatory Strategies: Compensatory Strategies Compensatory strategies: Yes Chin tuck: Ineffective Ineffective Chin Tuck: Thin liquid (Level 0)   General Information: Caregiver present: No  Diet Prior to this Study: Regular; Mildly thick liquids (Level 2, nectar thick)   Temperature : Normal   Respiratory Status: WFL   Supplemental O2: None (Room air)   History of Recent Intubation: No  Behavior/Cognition: Alert; Cooperative; Pleasant mood Self-Feeding Abilities: Able to self-feed Baseline vocal quality/speech: Normal Volitional Cough: Able to elicit Volitional Swallow: Able to elicit Exam Limitations: Limited visibility (limited visibility during chin tuck posture due to shoulder) Goal Planning: Prognosis for improved oropharyngeal function: Good No data recorded No data recorded Patient/Family Stated Goal: none stated Consulted and agree with results and recommendations: Patient Pain: Pain Assessment Pain Assessment: No/denies pain End of Session: Start Time:No data recorded Stop Time: No data recorded Time Calculation:No data recorded Charges: No data recorded SLP visit diagnosis: SLP Visit Diagnosis: Dysphagia, oropharyngeal phase (R13.12); Dysarthria and anarthria (R47.1) Past Medical History: Past Medical History: Diagnosis Date  Alcohol dependence (HCC)   Chronic calcific pancreatitis (HCC)   CVA  (cerebral vascular accident) (HCC) 01/24/2022  Diabetes mellitus without complication (HCC)   Hyperlipidemia   Hypertension   Melanoma (HCC) 2013  metastatic - resected and cured with chemo/immuno tx  Pancreatic cyst-mass?   Polyneuropathy  Past Surgical History: Past Surgical History: Procedure Laterality Date  BIOPSY  02/03/2022  Procedure: BIOPSY;  Surgeon: Iva Boop, MD;  Location: [facility] ENDOSCOPY;  Service: Gastroenterology;;  BRAIN SURGERY  2015  to check for possible malignancy from melanoma, result were scar tissue  COLONOSCOPY WITH PROPOFOL N/A 02/03/2022  Procedure: COLONOSCOPY WITH PROPOFOL;  Surgeon: Iva Boop, MD;  Location: [facility] ENDOSCOPY;  Service: Gastroenterology;  Laterality: N/A;  ESOPHAGOGASTRODUODENOSCOPY (EGD) WITH PROPOFOL N/A 02/03/2022  Procedure: ESOPHAGOGASTRODUODENOSCOPY (EGD) WITH PROPOFOL;  Surgeon: Iva Boop, MD;  Location: [facility] ENDOSCOPY;  Service: Gastroenterology;  Laterality: N/A;  melanoma exicison    right leg Kenneth [name], M.A., CCC-SLP Yetta Barre 02/12/2024, 9:42 AM    Labs:  Basic Metabolic Panel: Recent Labs  Lab 02/08/24 1634 02/09/24 0636 02/11/24 0609 02/14/24 0543  NA 138 140  --  140  K 3.8 3.7  --  3.5  CL 107 108  --  109  CO2 24 26  --  24  GLUCOSE 180* 110*  --  104*  BUN 10 9  --  11  CREATININE 1.14 1.20  --  1.06  CALCIUM 8.7* 8.6*  --  8.9  MG  --   --  1.6*  --     CBC: Recent Labs  Lab 02/12/24 0516 02/13/24 0517 02/14/24 0543  WBC 6.4 5.5 5.3  NEUTROABS 2.6 2.2 2.4  HGB 12.1* 11.5* 13.2  HCT 35.2* 33.6* 38.7*  MCV 85.2 85.5 85.6  PLT 275 256 294    CBG: Recent Labs  Lab 02/13/24 2115 02/14/24 0619 02/14/24 2127 02/15/24 0618 02/15/24 1146  GLUCAP 148* 98 220* 96 161*    Brief HPI:   Kenneth [name] is a 63 y.o. right-handed male with history of HTN, CVA, recent fall due to RLE weakness, EtOH abuse in the past, T2DM, polyneuropathy, melanoma with metastasis to the brain and lung (in remission); who  was admitted on 02/04/2024 with reports of right hand numbness with weakness as well as right facial weakness with slurred speech.  CTA head/neck showed severe stenosis of right PCA P3 segment and severe stenosis at origin at M2 L-MCA.  He was found to have acute infarct in left corona radiata, left lentiform nucleus, posterior limb left internal capsule as well as evidence of severe chronic microhemorrhages as sequela of hypertensive microangiopathy.   Dr. Roda Shutters felt the stroke was due to large vessel disease and recommended DAPT x 3 months followed by Plavix alone.  MBS [name] to evaluate swallow and showed moderate oropharyngeal dysphagia with silent aspiration of nectar thick liquids requiring chin tuck to prevent this.  Patient was noted to be limited by right-sided weakness with RUE ataxia, mild facial droop.  Dysarthria as well as dysphagia requiring modified diet.  He was modified independent prior to admission but was requiring min assist with PT/OT. CIR was recommended to function decline.   Hospital Course: Kenneth [name] was admitted to rehab 02/08/2024 for inpatient therapies to consist of PT, ST and OT at least three hours five days a week. Past admission physiatrist, therapy team and rehab RN have worked together to provide customized collaborative inpatient rehab.  He is blood pressures were monitored on TID basis and have been controlled on home dose Lotrel. Serial check of electrolytes showed hypokalemia initially which has has resolved with brief supplementation.  Most recent check of labs showed rise in SCr to 1.47 and is to continue to push po fluids. He was started on Mag-Ox due to low magnesium levels. He was noted to have facial seborrheic dermatitis and Ketoconazole cream resumed with triamcinolone used on prn basis. He is continent of bowel and bladder.    He continues on DAPT during his stay with serial CBC showing improvement in anemia and platelets to be within normal limits.  His  diabetes was monitored with achs CBG checks and blood sugars were noted to be trending up with improvement in intake.  Januvia was  resumed with recommendations to continue holding glipizide and actos. He was advised on monitoring his BS bid-qid basis and follow up with PCP for input on further titration of medications.  MBS was repeated on 03/11 showing improvement in airway protection and no significant penetration or aspiration with nectar liquids.  He did not require chin tuck with liquids and was educated on water protocol.  P.o. intake has been good.  He has made steady gains during his stay and is modified independent at discharge.  He will continue to receive follow-up outpatient PT, OT and ST at [facility]  Outpatient rehab at [facility] after discharge.   Rehab course: During patient's stay in rehab weekly team conferences were held to monitor patient's progress, set goals and discuss barriers to discharge. At admission, patient required min assist with mobility and with ADL tasks. He exhibited moderate this dysarthria as well as oropharyngeal dysphagia and required chin tuck with nectar thick liquids as well as intermittent supervision for safety. He  has had improvement in activity tolerance, balance, postural control as well as ability to compensate for deficits. He has had improvement in improvement in awareness and is able to complete ADL tasks at modified independent level. He is modified independent for transfers and is able to ambulate 500 feet with use of rollator independently.  He requires contact-guard assist to navigate 12 stairs. He is able to complete oropharyngeal swallow exercises at modified independent level and is able to tolerate water protocol between meals.  He was advised not to use any straws.  Speech clarity and expression has improved. Family education has been completed    Discharge disposition: 01-Home or Self Care  Diet: Heart healthy/Carb modified.   Special  Instructions: 1 .  To continue DAPT x 3 months followed by Plavix 2.  Recommend repeat BMET and Mg level in 1-2 weeks to monitor renal status and Mg level.    Discharge Instructions     Ambulatory referral to Neurology   Complete by: As directed    An appointment is requested in approximately: 4-6 weeks   Ambulatory referral to Physical Medicine Rehab   Complete by: As directed    Ambulatory referral to Podiatry   Complete by: As directed    Appointment in 3-4 weeks.      Allergies as of 02/16/2024       Reactions   Cat Dander Shortness Of Breath, Swelling   Swelling, watery eyes        Medication List     STOP taking these medications    carvedilol 3.125 MG tablet Commonly known as: Coreg   clopidogrel 75 MG tablet Commonly known as: PLAVIX   empagliflozin 10 MG Tabs tablet Commonly known as: Jardiance   metoprolol tartrate 100 MG tablet Commonly known as: LOPRESSOR   pioglitazone 30 MG tablet Commonly known as: ACTOS   potassium chloride SA 20 MEQ tablet Commonly known as: KLOR-CON M   senna-docusate 8.6-50 MG tablet Commonly known as: Senokot-S       TAKE these medications    Accu-Chek Guide Test test strip Generic drug: glucose blood Use to test blood sugar three times daily as directed.   amLODipine-benazepril 10-20 MG capsule Commonly known as: LOTREL Take 1 capsule by mouth daily.   aspirin EC 81 MG tablet Take 1 tablet (81 mg total) by mouth daily. Swallow whole.   atorvastatin 20 MG tablet Commonly known as: LIPITOR Take 1 tablet (20 mg total) by mouth daily.   B-complex with  vitamin C tablet Take 1 tablet by mouth daily.   Blood Glucose Monitoring Suppl Devi 1 each by Does not apply route in the morning, at noon, and at bedtime. May substitute to any manufacturer covered by patient's insurance.   diclofenac Sodium 1 % Gel Commonly known as: VOLTAREN Apply 2 g topically 4 (four) times daily.   ketoconazole 2 % cream Commonly  known as: NIZORAL Apply topically 2 (two) times daily.   lactobacillus acidophilus Tabs tablet Take 1 tablet by mouth daily. Investment banker, corporate Device Misc 1 each by Does not apply route in the morning, at noon, and at bedtime. May substitute to any manufacturer covered by patient's insurance.   Lancets Misc. Misc 1 each by Does not apply route in the morning, at noon, and at bedtime. May substitute to any manufacturer covered by patient's insurance.   magnesium oxide 400 MG tablet Commonly known as: MAG-OX Take 0.5 tablets (200 mg total) by mouth at bedtime.   mirtazapine 7.5 MG tablet Commonly known as: REMERON TAKE 1 TABLET BY MOUTH DAILY AT BEDTIME   multivitamin with minerals Tabs tablet Take 1 tablet by mouth daily.   pantoprazole 20 MG tablet Commonly known as: PROTONIX Take 1 tablet (20 mg total) by mouth daily.   sitaGLIPtin 100 MG tablet Commonly known as: Januvia Take 1 tablet (100 mg total) by mouth daily. What changed: when to take this   tamsulosin 0.4 MG Caps capsule Commonly known as: FLOMAX Take 1 capsule (0.4 mg total) by mouth daily after supper. What changed: See the new instructions.   triamcinolone cream 0.5 % Commonly known as: KENALOG Apply topically 2 (two) times daily as needed (facial rash).   vitamin D3 25 MCG tablet Commonly known as: CHOLECALCIFEROL Take 3 tablets (3,000 Units total) by mouth daily.        Follow-up Information     Rudd, Bertram Millard, MD Follow up.   Specialty: Family Medicine Why: Call in 1-2 days for post hospital follow up Contact information: [address] [place] [address] [phone]         Horton Chin, MD Follow up.   Specialty: Physical Medicine and Rehabilitation Why: office will call you with follow up appointment Contact information: [address] [place] [address] [phone]         [facility] Follow up.   Why: office will call you  with follow up appointment Contact information: [address]     [address] [place] [address] [phone]        Delories Heinz, DPM. Call.   Specialties: Podiatry, Radiology Why: for follow up appointment Contact information: [address] [place] [address] [phone]                 Signed: Jacquelynn Cree 02/18/2024, 12:34 PM

## 2024-02-15 NOTE — Plan of Care (Signed)
  Problem: RH Balance Goal: LTG Patient will maintain dynamic standing with ADLs (OT) Description: LTG:  Patient will maintain dynamic standing balance with assist during activities of daily living (OT)  Outcome: Completed/Met   Problem: Sit to Stand Goal: LTG:  Patient will perform sit to stand in prep for activites of daily living with assistance level (OT) Description: LTG:  Patient will perform sit to stand in prep for activites of daily living with assistance level (OT) Outcome: Completed/Met   Problem: RH Bathing Goal: LTG Patient will bathe all body parts with assist levels (OT) Description: LTG: Patient will bathe all body parts with assist levels (OT) Outcome: Completed/Met   Problem: RH Dressing Goal: LTG Patient will perform upper body dressing (OT) Description: LTG Patient will perform upper body dressing with assist, with/without cues (OT). Outcome: Completed/Met Goal: LTG Patient will perform lower body dressing w/assist (OT) Description: LTG: Patient will perform lower body dressing with assist, with/without cues in positioning using equipment (OT) Outcome: Completed/Met   Problem: RH Toileting Goal: LTG Patient will perform toileting task (3/3 steps) with assistance level (OT) Description: LTG: Patient will perform toileting task (3/3 steps) with assistance level (OT)  Outcome: Completed/Met   Problem: RH Functional Use of Upper Extremity Goal: LTG Patient will use RT/LT upper extremity as a (OT) Description: LTG: Patient will use right/left upper extremity as a stabilizer/gross assist/diminished/nondominant/dominant level with assist, with/without cues during functional activity (OT) Outcome: Completed/Met   Problem: RH Toilet Transfers Goal: LTG Patient will perform toilet transfers w/assist (OT) Description: LTG: Patient will perform toilet transfers with assist, with/without cues using equipment (OT) Outcome: Completed/Met   Problem: RH Tub/Shower  Transfers Goal: LTG Patient will perform tub/shower transfers w/assist (OT) Description: LTG: Patient will perform tub/shower transfers with assist, with/without cues using equipment (OT) Outcome: Completed/Met   

## 2024-02-15 NOTE — Progress Notes (Signed)
 PROGRESS NOTE   Subjective/Complaints: No new complaints this morning Denies pain Tolerating therapy well CBGs ranged from 90s-200- latter is outlier  ROS:   Pt denies SOB, abd pain, CP, N/V/C/D, and vision changes    Objective:   No results found.  Recent Labs    02/13/24 0517 02/14/24 0543  WBC 5.5 5.3  HGB 11.5* 13.2  HCT 33.6* 38.7*  PLT 256 294   Recent Labs    02/14/24 0543  NA 140  K 3.5  CL 109  CO2 24  GLUCOSE 104*  BUN 11  CREATININE 1.06  CALCIUM 8.9     Intake/Output Summary (Last 24 hours) at 02/15/2024 1035 Last data filed at 02/15/2024 0981 Gross per 24 hour  Intake 238 ml  Output 400 ml  Net -162 ml        Physical Exam: Vital Signs Blood pressure (!) 149/69, pulse 60, temperature 97.7 F (36.5 C), resp. rate 17, height 5\' 4"  (1.626 m), weight 70 kg, SpO2 100%. General: awake, alert, appropriate, sitting up in bed; NAD HENT: conjugate gaze; oropharynx moist CV: Bradycardic Pulmonary: CTA B/L; no W/R/R- good air movement GI: soft, NT, ND, (+)BS Psychiatric: appropriate- more interactive Neurological: Ox3  Skin: Clean and intact without signs of breakdown Neuro:     Mental Status: AAOx3, memory intact, fund of knowledge appropriate Speech/Languate: Naming and repetition intact, moderate dysarthria, follows simple commands   CRANIAL NERVES: II: PERRL. Visual fields full III, IV, VI: EOM intact, no gaze preference or deviation V: normal sensation bilaterally VII: Mild R facial weakness VIII: normal hearing to speech IX, X: normal palatal elevation XI: 4/5 shoulder shrug on the right XII: Tongue deviates to RIght     MOTOR: RUE: 4/5 Deltoid, 4/5 Biceps, 4/5 Triceps,4/5 Grip LUE: 5/5 Deltoid, 5/5 Biceps, 5/5 Triceps, 5/5 Grip RLE: HF 4/5, KE 4+/5, ADF 4+/5, APF 4+/5 LLE: HF 4+/5, KE 5/5, ADF 5/5, APF 5/5, stable 3/14     REFLEXES No ankle clonus    SENSORY: Altered sensation to LT RUE  Altered sensation b/l Feet- chronic neuropathy   Coordination: Normal finger to nose ataxia on R   MSK: R shoulder pain with PROM- chronic    Assessment/Plan: 1. Functional deficits which require 3+ hours per day of interdisciplinary therapy in a comprehensive inpatient rehab setting. Physiatrist is providing close team supervision and 24 hour management of active medical problems listed below. Physiatrist and rehab team continue to assess barriers to discharge/monitor patient progress toward functional and medical goals  Care Tool:  Bathing    Body parts bathed by patient: Right arm, Left arm, Chest, Abdomen, Front perineal area, Buttocks, Right upper leg, Face, Left lower leg, Right lower leg, Left upper leg         Bathing assist Assist Level: Contact Guard/Touching assist     Upper Body Dressing/Undressing Upper body dressing   What is the patient wearing?: Pull over shirt    Upper body assist Assist Level: Set up assist    Lower Body Dressing/Undressing Lower body dressing      What is the patient wearing?: Underwear/pull up, Pants     Lower body assist Assist for lower  body dressing: Contact Guard/Touching assist     Toileting Toileting    Toileting assist Assist for toileting: Contact Guard/Touching assist     Transfers Chair/bed transfer  Transfers assist     Chair/bed transfer assist level: Independent with assistive device Chair/bed transfer assistive device: Armrests, Geologist, engineering   Ambulation assist      Assist level: Independent with assistive device Assistive device: Rollator Max distance: 500'   Walk 10 feet activity   Assist     Assist level: Independent with assistive device Assistive device: Rollator   Walk 50 feet activity   Assist    Assist level: Independent with assistive device Assistive device: Rollator    Walk 150 feet activity   Assist     Assist level: Independent with assistive device Assistive device: Rollator    Walk 10 feet on uneven surface  activity   Assist     Assist level: Supervision/Verbal cueing Assistive device: Rollator   Wheelchair     Assist Is the patient using a wheelchair?: No Type of Wheelchair: Manual    Wheelchair assist level: Dependent - Patient 0% Max wheelchair distance: 150'    Wheelchair 50 feet with 2 turns activity    Assist        Assist Level: Dependent - Patient 0%   Wheelchair 150 feet activity     Assist      Assist Level: Dependent - Patient 0%   Blood pressure (!) 149/69, pulse 60, temperature 97.7 F (36.5 C), resp. rate 17, height 5\' 4"  (1.626 m), weight 70 kg, SpO2 100%.   Medical Problem List and Plan: 1. Functional deficits secondary to acute CVA  left corona radiata, left lentiform nucleus and posterior limb of left internal capsule likely due to large vessel disease             -patient may  shower             -ELOS/Goals: 7-10 days, PT/OT/SLP min A/sup            Continue CIR PT and OT  D3 started  2.  Impaired mobility: lovenox d/ced given worsening anemia, SCDs ordered.             -antiplatelet therapy: DAPT X 3 months followed by Plavix alone.  3. Pain Management: Tylenol prn. Add voltaren gel to right hip and left knee.  4. Mood/Behavior/Sleep: LCSW to follow for evaluation and support.              -antipsychotic agents: N/A 5. Neuropsych/cognition: This patient is capable of making decisions on his own behalf. 6. Skin/Wound Care: Routine pressure relief measures.  7. Fluids/Electrolytes/Nutrition: Monitor I/O. Check CMET IN AM. HTN: Monitor BP TID--on Lotensin.  8. HTN: Monitor BP TID--continue Lisinopri/amlodipine. Avoid hypoperfusion.           Magnesium supplement started HS, check magnesium level 3/14   9.  T2DM with neuropathy: Hgb A1C- 8.8. Monitor BS ac/hs and use SSI for elevated BS.              Metformin  started CBG (last 3)  Recent Labs (last 2 labs)       Recent Labs    02/07/24 2118 02/08/24 0652 02/08/24 1133  GLUCAP 155* 115* 177*        10. Hypokalemia: Improved after supplement. Recheck in am.  3/8- K+ 3.7 11. Hypomagnesemia: Improved after supplement. Recheck in am. 3/8- Last Mg 1.7- wasn't done this AM- will draw  Monday  12. BPH?: On flomax.  13. Melanoma with lung/brain mets: Treated with immunotherapy and Gamma knife. In remission. F/U outpatient [facility] specialist.  14. Hx of chronic calcific pancreatitis: Has been sober for 2 years.  15. Hyperlipidemia: LDL at goal.  Lipitor 20mg  was started.  16. Facial rash.  Suspect this is seborrheic dermatitis. He uses triamcinolone cream at home for this, has not tried any other creams previously.             -Will start ketoconazole cream, triamcinolone cream as needed 17. Hx of GI bleed 2023             -continue PPI  18. Dysphagia             -Continue SLP   Continue Nectar thick liquids- heart healthy diet  Discussed results of MBS  Water protocol ordered  19. Suboptimal potassium: klor 40 ordered 3/13, repeat BMP 3/14  20. Polypharmacy: benadryl and compazine d/ced to minimize risk of ulcer formation with coadministration with potassium  21. Anemia: lovenox d/ced, resolved  22. Bradycardia: continue to monitor HR TID, resolved     LOS: 7 days A FACE TO FACE EVALUATION WAS PERFORMED  Clint Bolder P Arine Foley 02/15/2024, 10:35 AM

## 2024-02-15 NOTE — Progress Notes (Signed)
 Inpatient Rehabilitation Care Coordinator Discharge Note DC SAT 3/15  Patient Details  Name: Kenneth [name] MRN: [id number] Date of Birth: March 10, 1961   Discharge location: HOME WITH SISTER WHO WORKS AND OTHER SISTER WILL CHECK ON THE DAYS SHE IS OUT OF THE HOME-M-W-F  Length of Stay: 8 DAYS  Discharge activity level: MOD/I -SUPERVISION LEVEL  Home/community participation: ACTIVE  Patient response WU:JWJXBJ Literacy - How often do you need to have someone help you when you read instructions, pamphlets, or other written material from your doctor or pharmacy?: Never  Patient response YN:WGNFAO Isolation - How often do you feel lonely or isolated from those around you?: Never  Services provided included: MD, RD, PT, OT, SLP, RN, CM, TR, Pharmacy, SW  Financial Services:  Field seismologist Utilized: Private Insurance [employer]  Choices offered to/list presented to: PT AND SISTER  Follow-up services arranged:  Outpatient    Outpatient Servicies: [facility] OT SP WILL CALL TO SET UP FOLLOW UP APPOINTMENTS    SISTER BOUGHT THE ROLLATOR AND HURRY CANE FOR PT. HAS OTHER NEEDED EQUIPMENT FROM PAST ADMISSIONS  Patient response to transportation need: Is the patient able to respond to transportation needs?: Yes In the past 12 months, has lack of transportation kept you from medical appointments or from getting medications?: No In the past 12 months, has lack of transportation kept you from meetings, work, or from getting things needed for daily living?: No   Patient/Family verbalized understanding of follow-up arrangements:  Yes  Individual responsible for coordination of the follow-up plan: SELF AND [name] [phone]  Confirmed correct DME delivered: Lucy Chris 02/15/2024    Comments (or additional information):PT DID WELL AND PROGRESSED QUICKLY HE IS DOING WELL AND READY TO GO HOME. SISTER WAS HERE DAILY AND OBSERVED IN  THERAPIES  Summary of Stay    Date/Time Discharge Planning CSW  02/13/24 [phone] Home with your sister she works out of the home three days and from home for two days, another sister can come over and check on him. if pt can get to mod/i would be safer since at times may be alone RGD       Conor Lata, Lemar Livings

## 2024-02-15 NOTE — Progress Notes (Signed)
 Discussed with Dr. Dalene Carrow --patient on sitagliptin PTA--will resume home med instead of metformin as less risk with nectar liquids on board.

## 2024-02-15 NOTE — Progress Notes (Signed)
 Physical Therapy Discharge Summary  Patient Details  Name: Kenneth [name] MRN: [id number] Date of Birth: 1961-11-17  Date of Discharge from PT service:February 15, 2024  Today's Date: 02/15/2024 PT Individual Time: 1000-1100 PT Individual Time Calculation (min): 60 min    Patient has met 9 of 9 long term goals due to improved activity tolerance, improved balance, improved postural control, increased strength, ability to compensate for deficits, functional use of  right upper extremity and right lower extremity, improved attention, improved awareness, and improved coordination.  Patient to discharge at an ambulatory level Modified Independent.   Patient's care partner is independent to provide the necessary physical and cognitive assistance at discharge.  Functional Outcome Measures: - 707FT with rollator -TUG - 20 seconds with rollator -BERG 26/56  Reasons goals not met: n/a  Recommendation:  Patient will benefit from ongoing skilled PT services in outpatient setting to continue to advance safe functional mobility, address ongoing impairments in standing balance, dynamic gait, fall prevention, and minimize fall risk.  Equipment: No equipment provided - pt self purchased rollator  Reasons for discharge: treatment goals met and discharge from hospital  Patient/family agrees with progress made and goals achieved: Yes  PT Discharge Precautions/Restrictions Precautions Precautions: Fall Precaution/Restrictions Comments: R hemi Restrictions Weight Bearing Restrictions Per Provider Order: No Pain Interference Pain Interference Pain Effect on Sleep: 0. Does not apply - I have not had any pain or hurting in the past 5 days Pain Interference with Therapy Activities: 1. Rarely or not at all Pain Interference with Day-to-Day Activities: 1. Rarely or not at all Vision/Perception  Vision - History Ability to See in Adequate Light: 0 Adequate Perception Perception: Within Functional  Limits Praxis Praxis: WFL  Cognition Overall Cognitive Status: Within Functional Limits for tasks assessed Arousal/Alertness: Awake/alert Orientation Level: Oriented X4 Year: 2025 Month: March Day of Week: Correct Memory: Appears intact Awareness: Appears intact Problem Solving: Appears intact Safety/Judgment: Appears intact Sensation Sensation Light Touch: Impaired Detail Peripheral sensation comments: R palm; neuropathy in B feet Light Touch Impaired Details: Impaired RUE;Impaired RLE;Impaired LLE Hot/Cold: Appears Intact Proprioception: Appears Intact Stereognosis: Appears Intact Coordination Fine Motor Movements are Fluid and Coordinated: No Coordination and Movement Description: R FMC limited by decreased sensation, decrease coordination of RLE Motor  Motor Motor: Hemiplegia;Ataxia  Mobility Bed Mobility Bed Mobility: Supine to Sit;Sit to Supine Supine to Sit: Independent with assistive device Sit to Supine: Independent with assistive device Transfers Transfers: Sit to Stand;Stand to Sit;Stand Pivot Transfers Sit to Stand: Independent with assistive device Stand to Sit: Independent with assistive device Stand Pivot Transfers: Independent with assistive device Transfer (Assistive device): Rollator Locomotion  Gait Ambulation: Yes Gait Assistance: Independent with assistive device Gait Distance (Feet): 500 Feet Assistive device: Rollator Gait Gait: Yes Gait Pattern: Impaired Gait Pattern: Step-through pattern;Decreased stance time - right;Decreased dorsiflexion - right;Poor foot clearance - right;Trunk flexed;Ataxic Stairs / Additional Locomotion Stairs: Yes Stairs Assistance: Contact Guard/Touching assist Stair Management Technique: Two rails Number of Stairs: 12 Height of Stairs: 6 Ramp: Supervision/Verbal cueing Pick up small object from the floor assist level: Contact Guard/Touching assist Pick up small object from the floor assistive device:  reacher Wheelchair Mobility Wheelchair Mobility: No  Trunk/Postural Assessment  Cervical Assessment Cervical Assessment: Within Functional Limits Thoracic Assessment Thoracic Assessment: Within Functional Limits Lumbar Assessment Lumbar Assessment: Exceptions to [facility] (post tilt) Postural Control Postural Control: Deficits on evaluation Trunk Control: posterior bias in unsupported standing  Balance Balance Balance Assessed: Yes Standardized Balance Assessment Standardized Balance Assessment:  Berg Balance Test;Timed Up and Go Test [employer] Test Sit to Stand: Able to stand  independently using hands Standing Unsupported: Able to stand safely 2 minutes Sitting with Back Unsupported but Feet Supported on Floor or Stool: Able to sit safely and securely 2 minutes Stand to Sit: Controls descent by using hands Transfers: Able to transfer safely, definite need of hands Standing Unsupported with Eyes Closed: Able to stand 3 seconds Standing Ubsupported with Feet Together: Able to place feet together independently but unable to hold for 30 seconds From Standing, Reach Forward with Outstretched Arm: Reaches forward but needs supervision From Standing Position, Pick up Object from Floor: Able to pick up shoe, needs supervision From Standing Position, Turn to Look Behind Over each Shoulder: Needs supervision when turning Turn 360 Degrees: Needs assistance while turning Standing Unsupported, Alternately Place Feet on Step/Stool: Needs assistance to keep from falling or unable to try Standing Unsupported, One Foot in Front: Loses balance while stepping or standing Standing on One Leg: Unable to try or needs assist to prevent fall Total Score: 26 Timed Up and Go Test TUG: Normal TUG Normal TUG (seconds): 20 (with rollator) Extremity Assessment      RLE Assessment RLE Assessment: Exceptions to [facility] General Strength Comments: Grossly 4/5 LLE Assessment LLE Assessment: Exceptions to  [facility] General Strength Comments: Grossly 4+/5  Skilled Treatment Session: Pt in bed to start - agreeable to PT tx. Has no c/o pain - pt aware of upcoming DC home tomorrow. Reports excitement and readiness. Supine<>sitting EOB mod I. Don's his tennis shoes with setupA as he sit's EOB - able to tie them without assist.   Sit<>stand to rollator with mod I. Ambulates mod I with rollator from his room to main rehab gym ~119ft.   Completed functional outcome measure assessment TUG and BERG. See above for details.   TUG completed in 20 s with the rollator *Scores > 13.5 s indicate increased falls risk  Patient demonstrates increased fall risk as noted by score of   26/56 on Berg Balance Scale.  (<36= high risk for falls, close to 100%; 37-45 significant >80%; 46-51 moderate >50%; 52-55 lower >25%)  Administered with the rollator at mod I level. Pt able to cover 707 ft. Pt improved from 448ft when tested on 3/10.   Stair training completed using 6" steps and 2 hand rails. Pt able to recall proper sequencing with L foot leading ascent and R leading descent. CGA provided for safety. Step-to pattern both directions.   Returned to his room - pt made mod I in room and edcuated on safety precautions. NT also made aware - sign outside door. All needs met at end, pt sitting in recliner.    Emric Kowalewski P Maxwel Meadowcroft  PT, DPT, CSRS  02/15/2024, 10:30 AM

## 2024-02-15 NOTE — Progress Notes (Signed)
 Occupational Therapy Discharge Summary  Patient Details  Name: Kenneth [name] MRN: [id number] Date of Birth: May 05, 1961  Date of Discharge from OT service:February 15, 2024  Today's Date: 02/15/2024 OT Individual Time: 1421-1530 OT Individual Time Calculation (min): 69 min    Patient has met 9 of 9 long term goals due to functional use of  RIGHT upper and RIGHT lower extremity.  Patient to discharge at overall Modified Independent level.  Patient's care partner is independent to provide the necessary physical and cognitive assistance at discharge.    Reasons goals not met: All goals met  Recommendation: outpatient setting Patient will benefit from ongoing skilled OT services in  to continue to advance functional skills in the area of BADL, iADL, and Reduce care partner burden.  Equipment: BSC  Reasons for discharge: treatment goals met and discharge from hospital  Patient/family agrees with progress made and goals achieved: Yes  OT Discharge Skilled Therapeutic Interventions/Progress Updates: Pt received sitting up in recliner presenting to be in good spirits receptive to skilled OT session reporting 0/10 pain- OT offering intermittent rest breaks, repositioning, and therapeutic support to optimize participation in therapy session. Pt requesting to take shower this session- focused session on safety awareness with BADLs and BADL retraining to increase overall independence to decrease bourdon of care. Pt able to complete functional mobility and ambulatory toilet transfers this session using rollator mod I with appropriate rollator positioning and recall of need to lock/unlock breaks. Pt completed 3/3 toileting tasks mod I following continent void and BM using rollator for balance- documented in flowsheets. Pt transferred to walk-in shower to TTB with distant supervision provided for safety. Pt able to complete U/LB bathing mod I completing lateral leans to wash buttocks and crossing legs into  figure-four position to wash feet. Following shower, Pt completed U/LB dressing donning socks, shoes, shirt, and pants sitting EOB mod I with increased time using rollator for balance. Engaged Pt in completing grooming/hygiene tasks standing at the sink using rollator for increased balance and activity tolerance challenge with Pt able to groom hair and beard mod I without LOB. Engaged Pt in completing functional mobility to outdoor location using rollator for endurance training and to work on functional mobility across uneven terrain to simulate community mobility with Pt able to complete >500 ft functional mobility mod I and appropriately request rest breaks as needed.  Pt was left resting in recliner with call bell in reach and all needs met.   Precautions/Restrictions  Precautions Precautions: Fall Precaution/Restrictions Comments: R hemi Restrictions Weight Bearing Restrictions Per Provider Order: No Pain Pain Assessment Pain Scale: 0-10 Pain Score: 0-No pain ADL ADL Eating: Independent Where Assessed-Eating: Chair Grooming: Modified independent, Independent Where Assessed-Grooming: Sitting at sink, Standing at sink Upper Body Bathing: Independent, Modified independent Where Assessed-Upper Body Bathing: Shower Lower Body Bathing: Modified independent Where Assessed-Lower Body Bathing: Shower Upper Body Dressing: Independent, Modified independent (Device) Where Assessed-Upper Body Dressing: Edge of bed Lower Body Dressing: Modified independent, Independent Where Assessed-Lower Body Dressing: Edge of bed Toileting: Modified independent Where Assessed-Toileting: Teacher, adult education: Engineer, agricultural Method: Proofreader: Raised Scientist, research (physical sciences): Not assessed Film/video editor: Distant supervision Film/video editor Method: Designer, industrial/product: Emergency planning/management officer, Grab bars Vision Baseline  Vision/History: 1 Wears glasses Patient Visual Report: No change from baseline Vision Assessment?: No apparent visual deficits Perception  Perception: Within Functional Limits Praxis Praxis: WFL Cognition Cognition Overall Cognitive Status: Within Functional Limits for tasks assessed  Arousal/Alertness: Awake/alert Orientation Level: Person;Situation;Place Person: Oriented Place: Oriented Situation: Oriented Memory: Appears intact Awareness: Appears intact Problem Solving: Appears intact Executive Function:  (all intact for BADLs) Safety/Judgment: Appears intact Brief Interview for Mental Status (BIMS) Repetition of Three Words (First Attempt): 3 Temporal Orientation: Year: Correct Temporal Orientation: Month: Accurate within 5 days Temporal Orientation: Day: Correct Recall: "Sock": Yes, no cue required Recall: "Blue": Yes, no cue required Recall: "Bed": Yes, no cue required BIMS Summary Score: 15 Sensation Sensation Light Touch: Impaired Detail Peripheral sensation comments: R palm; neuropathy in B feet Light Touch Impaired Details: Impaired RUE;Impaired RLE;Impaired LLE Hot/Cold: Appears Intact Proprioception: Appears Intact Stereognosis: Appears Intact Coordination Gross Motor Movements are Fluid and Coordinated: No Fine Motor Movements are Fluid and Coordinated: No Coordination and Movement Description: R FMC limited by decreased sensation, decrease coordination of RLE Finger Nose Finger Test: slow but accurate on R side with mild dysmetria, improvement from inital eval Motor  Motor Motor: Hemiplegia;Ataxia Mobility  Bed Mobility Bed Mobility: Supine to Sit;Sit to Supine Supine to Sit: Independent with assistive device Sit to Supine: Independent with assistive device Transfers Sit to Stand: Independent with assistive device Stand to Sit: Independent with assistive device  Trunk/Postural Assessment  Cervical Assessment Cervical Assessment: Within Functional  Limits Thoracic Assessment Thoracic Assessment: Within Functional Limits Lumbar Assessment Lumbar Assessment: Exceptions to [facility] (post tilt) Postural Control Postural Control: Deficits on evaluation Trunk Control: posterior bias in unsupported standing  Balance Balance Balance Assessed: Yes Static Sitting Balance Static Sitting - Balance Support: Feet supported Static Sitting - Level of Assistance: 7: Independent Dynamic Sitting Balance Dynamic Sitting - Balance Support: Feet supported Dynamic Sitting - Level of Assistance: 7: Independent Static Standing Balance Static Standing - Balance Support: During functional activity Static Standing - Level of Assistance: 6: Modified independent (Device/Increase time) Dynamic Standing Balance Dynamic Standing - Balance Support: During functional activity Dynamic Standing - Level of Assistance: 6: Modified independent (Device/Increase time) Extremity/Trunk Assessment RUE Assessment RUE Assessment: Exceptions to [facility] Passive Range of Motion (PROM) Comments: sh flexion 160 Active Range of Motion (AROM) Comments: sh flexion 120 General Strength Comments: triceps 4/5, biceps 4/5,  grasp 4+/5 (numb in thumb and 1st 2 digits) - difficulty with FMC LUE Assessment LUE Assessment: Within Functional Limits   Clide Deutscher 02/15/2024, 3:35 PM

## 2024-02-16 ENCOUNTER — Other Ambulatory Visit (HOSPITAL_COMMUNITY): Payer: Self-pay

## 2024-02-16 LAB — GLUCOSE, CAPILLARY: Glucose-Capillary: 100 mg/dL — ABNORMAL HIGH (ref 70–99)

## 2024-02-16 NOTE — Progress Notes (Signed)
 PROGRESS NOTE   Subjective/Complaints:  Pt doing well, ready to go! Slept ok, denies pain, LBM just now, urinating fine, denies any other complaints or concerns. No questions about d/c paperwork  ROS:   Pt denies SOB, abd pain, CP, N/V/C/D, and vision changes    Objective:   No results found.  Recent Labs    02/14/24 0543  WBC 5.3  HGB 13.2  HCT 38.7*  PLT 294   Recent Labs    02/14/24 0543 02/15/24 1121  NA 140 142  K 3.5 3.5  CL 109 110  CO2 24 26  GLUCOSE 104* 152*  BUN 11 11  CREATININE 1.06 1.47*  CALCIUM 8.9 9.0     Intake/Output Summary (Last 24 hours) at 02/16/2024 1027 Last data filed at 02/16/2024 0811 Gross per 24 hour  Intake 598 ml  Output 200 ml  Net 398 ml        Physical Exam: Vital Signs Blood pressure (!) 168/82, pulse (!) 58, temperature 97.7 F (36.5 C), temperature source Oral, resp. rate 18, height 5\' 4"  (1.626 m), weight 70 kg, SpO2 100%.  General: awake, alert, appropriate, sitting up in w/c; NAD HENT: conjugate gaze; oropharynx moist CV: Bradycardic, no m/r/g appreciated Pulmonary: CTA B/L; no W/R/R- good air movement GI: soft, NT, ND, (+)BS Psychiatric: appropriate- more interactive Neurological: Ox3  Skin: Clean and intact without signs of breakdown over exposed surfaces  PRIOR EXAMS: Neuro:     Mental Status: AAOx3, memory intact, fund of knowledge appropriate Speech/Languate: Naming and repetition intact, moderate dysarthria, follows simple commands   CRANIAL NERVES: II: PERRL. Visual fields full III, IV, VI: EOM intact, no gaze preference or deviation V: normal sensation bilaterally VII: Mild R facial weakness VIII: normal hearing to speech IX, X: normal palatal elevation XI: 4/5 shoulder shrug on the right XII: Tongue deviates to RIght     MOTOR: RUE: 4/5 Deltoid, 4/5 Biceps, 4/5 Triceps,4/5 Grip LUE: 5/5 Deltoid, 5/5 Biceps, 5/5 Triceps, 5/5  Grip RLE: HF 4/5, KE 4+/5, ADF 4+/5, APF 4+/5 LLE: HF 4+/5, KE 5/5, ADF 5/5, APF 5/5, stable 3/14     REFLEXES No ankle clonus   SENSORY: Altered sensation to LT RUE  Altered sensation b/l Feet- chronic neuropathy   Coordination: Normal finger to nose ataxia on R   MSK: R shoulder pain with PROM- chronic    Assessment/Plan: 1. Functional deficits which require 3+ hours per day of interdisciplinary therapy in a comprehensive inpatient rehab setting. Physiatrist is providing close team supervision and 24 hour management of active medical problems listed below. Physiatrist and rehab team continue to assess barriers to discharge/monitor patient progress toward functional and medical goals  Care Tool:  Bathing    Body parts bathed by patient: Right arm, Left arm, Chest, Abdomen, Front perineal area, Buttocks, Right upper leg, Face, Left lower leg, Right lower leg, Left upper leg         Bathing assist Assist Level: Independent with assistive device Assistive Device Comment: TTB   Upper Body Dressing/Undressing Upper body dressing   What is the patient wearing?: Pull over shirt    Upper body assist Assist Level: Independent with assistive device  Assistive Device Comment: Increased time  Lower Body Dressing/Undressing Lower body dressing      What is the patient wearing?: Underwear/pull up, Pants     Lower body assist Assist for lower body dressing: Independent with assitive device Assistive Device Comment: Rollator   Toileting Toileting    Toileting assist Assist for toileting: Independent with assistive device Assistive Device Comment: Rollator   Transfers Chair/bed transfer  Transfers assist     Chair/bed transfer assist level: Independent with assistive device Chair/bed transfer assistive device: Armrests, Geologist, engineering   Ambulation assist      Assist level: Independent with assistive device Assistive device: Rollator Max  distance: 500'   Walk 10 feet activity   Assist     Assist level: Independent with assistive device Assistive device: Rollator   Walk 50 feet activity   Assist    Assist level: Independent with assistive device Assistive device: Rollator    Walk 150 feet activity   Assist    Assist level: Independent with assistive device Assistive device: Rollator    Walk 10 feet on uneven surface  activity   Assist     Assist level: Supervision/Verbal cueing Assistive device: Rollator   Wheelchair     Assist Is the patient using a wheelchair?: No Type of Wheelchair: Manual    Wheelchair assist level: Dependent - Patient 0% Max wheelchair distance: 150'    Wheelchair 50 feet with 2 turns activity    Assist        Assist Level: Dependent - Patient 0%   Wheelchair 150 feet activity     Assist      Assist Level: Dependent - Patient 0%   Blood pressure (!) 168/82, pulse (!) 58, temperature 97.7 F (36.5 C), temperature source Oral, resp. rate 18, height 5\' 4"  (1.626 m), weight 70 kg, SpO2 100%.   Medical Problem List and Plan: 1. Functional deficits secondary to acute CVA  left corona radiata, left lentiform nucleus and posterior limb of left internal capsule likely due to large vessel disease             -patient may  shower             -ELOS/Goals: 7-10 days, PT/OT/SLP min A/sup            Continue CIR PT and OT  D3 started  -02/16/24 d/c today, meds reviewed, questions answered  2.  Impaired mobility: lovenox d/ced given worsening anemia, SCDs ordered.             -antiplatelet therapy: DAPT X 3 months followed by Plavix alone.  3. Pain Management: Tylenol prn. Add voltaren gel to right hip and left knee.  4. Mood/Behavior/Sleep: LCSW to follow for evaluation and support.              -antipsychotic agents: N/A 5. Neuropsych/cognition: This patient is capable of making decisions on his own behalf. 6. Skin/Wound Care: Routine pressure relief  measures.  7. Fluids/Electrolytes/Nutrition: Monitor I/O. Check CMET IN AM. HTN: Monitor BP TID--on Lotensin.  8. HTN: Monitor BP TID--continue Lisinopri/amlodipine. Avoid hypoperfusion.           Magnesium supplement started HS, check magnesium level 3/14  -02/16/24 BP ok, monitor outpatient   9.  T2DM with neuropathy: Hgb A1C- 8.8. Monitor BS ac/hs and use SSI for elevated BS.              Metformin started  -02/16/24 CBGs better, monitor outpatient CBG (last 3)  Recent Labs    02/15/24 1146 02/15/24 2054 02/16/24 0637  GLUCAP 161* 184* 100*      10. Hypokalemia: Improved after supplement. Recheck in am.  3/8- K+ 3.7 11. Hypomagnesemia: Improved after supplement. Recheck in am. 3/8- Last Mg 1.7- wasn't done this AM- will draw Monday  -02/16/24 Mg 1.5 yesterday, IV mg given, monitor outpatient 12. BPH?: On flomax.  13. Melanoma with lung/brain mets: Treated with immunotherapy and Gamma knife. In remission. F/U outpatient [facility] specialist.  14. Hx of chronic calcific pancreatitis: Has been sober for 2 years.  15. Hyperlipidemia: LDL at goal.  Lipitor 20mg  was started.  16. Facial rash.  Suspect this is seborrheic dermatitis. He uses triamcinolone cream at home for this, has not tried any other creams previously.             -Will start ketoconazole cream, triamcinolone cream as needed 17. Hx of GI bleed 2023             -continue PPI  18. Dysphagia             -Continue SLP   Continue Nectar thick liquids- heart healthy diet  Discussed results of MBS  Water protocol ordered  19. Suboptimal potassium: klor 40 ordered 3/13, repeat BMP 3/14  20. Polypharmacy: benadryl and compazine d/ced to minimize risk of ulcer formation with coadministration with potassium  21. Anemia: lovenox d/ced, resolved  22. Bradycardia: continue to monitor HR TID, resolved     LOS: 8 days A FACE TO FACE EVALUATION WAS PERFORMED  [address] 02/16/2024, 10:27 AM

## 2024-02-16 NOTE — Progress Notes (Signed)
 Inpatient Rehabilitation Discharge Medication Review by a Pharmacist  A complete drug regimen review was completed for this patient to identify any potential clinically significant medication issues.  High Risk Drug Classes Is patient taking? Indication by Medication  Antipsychotic No   Anticoagulant No   Antibiotic No   Opioid No   Antiplatelet Yes Aspirin/Plavix - stroke  Hypoglycemics/insulin Yes Januvia and Actos - DM  Vasoactive Medication Yes Lotrel - HTN  Chemotherapy No   Other Yes B&C complex, Metanx. Mag Ox, Vit D, MVI - supplementation Voltaren gel - pain Protonix - GERD Flomax - BPH Atorvastatin - HLD Remeron - MDD     Type of Medication Issue Identified Description of Issue Recommendation(s)  Drug Interaction(s) (clinically significant)     Duplicate Therapy     Allergy     No Medication Administration End Date     Incorrect Dose     Additional Drug Therapy Needed     Significant med changes from prior encounter (inform family/care partners about these prior to discharge).    Other   Please educate patient/family - regarding meds that have stopped.    Clinically significant medication issues were identified that warrant physician communication and completion of prescribed/recommended actions by midnight of the next day:  No  Name of provider notified for urgent issues identified:   Provider Method of Notification:     Pharmacist comments:   Time spent performing this drug regimen review (minutes):  20   Jeanella Cara, PharmD, [place] Clinical Pharmacist Please see AMION for all Pharmacists' Contact Phone Numbers

## 2024-02-16 NOTE — Progress Notes (Signed)
 [address] Personal assistant aware reviewed information with patient this morning and rounding on him. Explained patient is ready for discharge when family arrives to assist with transport. Morning medications were not given prior to patient being discharged. Time of family member arriving was unknown. Writer was unavailable when family arrived. Unknown that family member arrived and at that time second Nurse assisted with facilitating discharge of patient. AVS of patient updated to reflect morning medication is due and not given.

## 2024-02-17 ENCOUNTER — Other Ambulatory Visit: Payer: Self-pay | Admitting: Physician Assistant

## 2024-02-17 MED ORDER — L-METHYLFOLATE-B6-B12 3-35-2 MG PO TABS: 1.0000 | ORAL_TABLET | Freq: Every day | ORAL | 1 refills | Status: DC

## 2024-02-17 MED ORDER — CLOPIDOGREL BISULFATE 75 MG PO TABS: 75.0000 mg | ORAL_TABLET | Freq: Every day | ORAL | 1 refills | Status: AC

## 2024-02-18 ENCOUNTER — Telehealth: Payer: Self-pay

## 2024-02-18 NOTE — Transitions of Care (Post Inpatient/ED Visit) (Signed)
   02/18/2024  Name: Kenneth [name] MRN: [id number] DOB: 1961/03/22  Today's TOC FU Call Status: Today's TOC FU Call Status:: Successful TOC FU Call Completed TOC FU Call Complete Date: 02/18/24 Patient's Name and Date of Birth confirmed.  Transition Care Management Follow-up Telephone Call Date of Discharge: 02/16/24 Discharge Facility: [facility]) Type of Discharge: Inpatient Admission Primary Inpatient Discharge Diagnosis:: Stroke How have you been since you were released from the hospital?: Better Any questions or concerns?: No  Items Reviewed: Did you receive and understand the discharge instructions provided?: Yes Medications obtained,verified, and reconciled?: Yes (Medications Reviewed) Any new allergies since your discharge?: No Dietary orders reviewed?: No Do you have support at home?: Yes  Medications Reviewed Today: Medications Reviewed Today   Medications were not reviewed in this encounter     Home Care and Equipment/Supplies: Were Home Health Services Ordered?: No Any new equipment or medical supplies ordered?: No  Functional Questionnaire: Do you need assistance with bathing/showering or dressing?: No Do you need assistance with meal preparation?: No Do you need assistance with eating?: No Do you have difficulty maintaining continence: No Do you need assistance with getting out of bed/getting out of a chair/moving?: No Do you have difficulty managing or taking your medications?: No  Follow up appointments reviewed: PCP Follow-up appointment confirmed?: Yes Date of PCP follow-up appointment?: 02/26/24 Follow-up Provider: Dr. Veto Kemps [facility] Follow-up appointment confirmed?: NA Do you need transportation to your follow-up appointment?: No Do you understand care options if your condition(s) worsen?: Yes-patient verbalized understanding    SIGNATURE Keyon Liller D, CMA

## 2024-02-25 ENCOUNTER — Encounter: Payer: Self-pay | Admitting: Physical Therapy

## 2024-02-25 ENCOUNTER — Ambulatory Visit: Attending: Family Medicine | Admitting: Physical Therapy

## 2024-02-25 DIAGNOSIS — R1312 Dysphagia, oropharyngeal phase: Secondary | ICD-10-CM | POA: Diagnosis present

## 2024-02-25 DIAGNOSIS — R471 Dysarthria and anarthria: Secondary | ICD-10-CM | POA: Diagnosis present

## 2024-02-25 DIAGNOSIS — Z9181 History of falling: Secondary | ICD-10-CM | POA: Diagnosis present

## 2024-02-25 DIAGNOSIS — I639 Cerebral infarction, unspecified: Secondary | ICD-10-CM | POA: Diagnosis present

## 2024-02-25 DIAGNOSIS — R278 Other lack of coordination: Secondary | ICD-10-CM | POA: Diagnosis present

## 2024-02-25 DIAGNOSIS — R262 Difficulty in walking, not elsewhere classified: Secondary | ICD-10-CM | POA: Diagnosis present

## 2024-02-25 DIAGNOSIS — R208 Other disturbances of skin sensation: Secondary | ICD-10-CM | POA: Insufficient documentation

## 2024-02-25 DIAGNOSIS — M6281 Muscle weakness (generalized): Secondary | ICD-10-CM | POA: Diagnosis present

## 2024-02-25 DIAGNOSIS — R2681 Unsteadiness on feet: Secondary | ICD-10-CM | POA: Insufficient documentation

## 2024-02-25 NOTE — Therapy (Signed)
 OUTPATIENT PHYSICAL THERAPY LOWER EXTREMITY EVALUATION   Patient Name: Kenneth [name] MRN: [id number] DOB:04-03-1961, 63 y.o., male Today's Date: 02/25/2024  END OF SESSION:  PT End of Session - 02/25/24 1648     Visit Number 1    Date for PT Re-Evaluation 05/27/24    Authorization Type [employer] MCD    PT Start Time 1650    PT Stop Time 1740    PT Time Calculation (min) 50 min    Equipment Utilized During Treatment Gait belt    Activity Tolerance Patient tolerated treatment well    Behavior During Therapy WFL for tasks assessed/performed              Past Medical History:  Diagnosis Date   Alcohol dependence (HCC)    Chronic calcific pancreatitis (HCC)    CVA (cerebral vascular accident) (HCC) 01/24/2022   Diabetes mellitus without complication (HCC)    Hyperlipidemia    Hypertension    Melanoma (HCC) 2013   metastatic - resected and cured with chemo/immuno tx   Pancreatic cyst-mass?    Polyneuropathy    Past Surgical History:  Procedure Laterality Date   BIOPSY  02/03/2022   Procedure: BIOPSY;  Surgeon: Iva Boop, MD;  Location: [facility] ENDOSCOPY;  Service: Gastroenterology;;   BRAIN SURGERY  2015   to check for possible malignancy from melanoma, result were scar tissue   COLONOSCOPY WITH PROPOFOL N/A 02/03/2022   Procedure: COLONOSCOPY WITH PROPOFOL;  Surgeon: Iva Boop, MD;  Location: [facility] ENDOSCOPY;  Service: Gastroenterology;  Laterality: N/A;   ESOPHAGOGASTRODUODENOSCOPY (EGD) WITH PROPOFOL N/A 02/03/2022   Procedure: ESOPHAGOGASTRODUODENOSCOPY (EGD) WITH PROPOFOL;  Surgeon: Iva Boop, MD;  Location: [facility] ENDOSCOPY;  Service: Gastroenterology;  Laterality: N/A;   melanoma exicison     right leg   Patient Active Problem List   Diagnosis Date Noted   CKD stage G2/A2, GFR 60-89 and albumin creatinine ratio 30-299 mg/g 02/13/2024   Stroke (cerebrum) (HCC) 02/08/2024   Acute focal neurological deficit 02/04/2024   Peripheral neuropathy 08/22/2023    Osteoarthritis of left knee 08/22/2023   BPV (benign positional vertigo) 02/06/2023   Seborrhea 11/07/2022   History of melanoma 04/12/2022   Alcohol dependence in remission (HCC) 04/12/2022   Aortic atherosclerosis (HCC) 04/12/2022   Alcohol-induced chronic pancreatitis (HCC) 03/15/2022   History of CVA (cerebrovascular accident) 02/28/2022   Gastritis and gastroduodenitis    DNR (do not resuscitate) 02/01/2022   Vertigo 01/24/2022   Elevated PSA 11/24/2021   History of immunotherapy 11/25/2019   Melanoma metastatic to brain (HCC) 08/08/2016   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HCC) 08/13/2014   Metastatic melanoma to lung (HCC) 12/12/2012   Type 2 diabetes mellitus (HCC) 08/20/2007   Dyslipidemia 08/20/2007   Essential hypertension 08/19/2007    PCP: Loyola Mast, MD  REFERRING PROVIDER: Carlis Abbott, MD  REFERRING DIAG: Diagnosis R29.898 (ICD-10-CM) - Right leg weakness Z86.73 (ICD-10-CM) - History of CVA (cerebrovascular accident)  THERAPY DIAG:  Cerebrovascular accident (CVA), unspecified mechanism (HCC)  Unsteadiness on feet  Difficulty in walking, not elsewhere classified  Muscle weakness (generalized)  History of falling  Rationale for Evaluation and Treatment: Rehabilitation  ONSET DATE: 02/04/24  SUBJECTIVE:   SUBJECTIVE STATEMENT:  Patient was being seen here for leg pain and weakness in February.  He was admitted to the hospital on 02/04/24, was discharged on 02/16/24.   Kenneth [name] is a 63 y.o. male who presented to [facility] ED with c/o slurred speech, facial droop, and R arm tingling. MRI  on 02/04/2024 revealed Two adjacent acute infarcts within the left corona radiata, left lentiform nucleus and posterior limb of left internal capsule. PMHx includes alcohol dependence, chronic calcific pancreatitis, CVA, DM, HLD, HTN, melanoma, polyneuropathy   PERTINENT HISTORY: See above  PAIN:  Are you having pain? No  PRECAUTIONS: Fall  RED FLAGS: None   WEIGHT BEARING  RESTRICTIONS: No  FALLS:  Has patient fallen in last 6 months? Yes, 1 LIVING ENVIRONMENT: Lives with: lives with their family Lives in: House/apartment- townhouse  Stairs: No Has following equipment at home: Environmental consultant - 2 wheeled and Wheelchair (manual)  OCCUPATION: not working   PLOF: Independent with gait, Independent with transfers, and Requires assistive device for independence  PATIENT GOALS: to be able to get around on my own completely, be more independent   NEXT MD VISIT: [place] tomorrow, neurologist April 28th  OBJECTIVE:  Note: Objective measures were completed at Evaluation unless otherwise noted.  DIAGNOSTIC FINDINGS:   02/04/24 IMPRESSION: 1. Two adjacent acute infarcts within the left corona radiata, left lentiform nucleus and posterior limb of left internal capsule (individually measuring up to 2 cm). 2. Moderate-sized focus of chronic cortical encephalomalacia/gliosis within the anterolateral left frontal lobe (deep to a cranioplasty). 3. Background parenchymal atrophy, chronic small vessel ischemic disease and chronic lacunar infarcts, as described. 4. Several chronic microhemorrhages in a distribution suggesting sequelae of hypertensive microangiopathy. 5. Mild inflammatory left maxillary sinus disease. COGNITION: Overall cognitive status: Within functional limits for tasks assessed, slurred speech     SENSATION: Not tested hx of neuropathy   COORDINATION  Impaired coordination R LE, facial droop right, slurred speech and difficulty blinking right eye   LOWER EXTREMITY MMT:  MMT Right eval Left eval  Hip flexion 3+ 4+  Hip extension    Hip abduction 4- 5  Hip adduction    Hip internal rotation    Hip external rotation    Knee flexion 4- 5  Knee extension 4- 5  Ankle dorsiflexion 4- 4-  Ankle plantarflexion    Ankle inversion    Ankle eversion     (Blank rows = not tested)   FUNCTIONAL TESTS:  TUG: 26 seconds with 4WW 5 times sit to stand:  25 seconds use of BUEs on chair, posterior unsteadiness/poor eccentric control  Berg: 24/56 3 minute walk test: 350 feet with 4WW, c/o fatigue and the right foot started to drag  GAIT: Distance walked: 369ft Assistive device utilized: 4WW Level of assistance: SBA Comments:  limited WB RLE, limited ankle DF and knee ROM during gait cycle , mild toe drag when fatigued                                                                                                                                TREATMENT DATE:    PATIENT EDUCATION:  Education details: exam, POC, HEP  Person educated: Patient Education method: Explanation, Demonstration, and Handouts Education comprehension: verbalized understanding, returned demonstration,  and needs further education  HOME EXERCISE PROGRAM: Access Code: [email or web address] URL: [email or web address] Date: 02/25/2024 Prepared by: Stacie Glaze  Exercises - Seated Long Arc Quad  - 2 x daily - 7 x weekly - 2 sets - 10 reps - 3 hold - Seated March  - 2 x daily - 7 x weekly - 2 sets - 10 reps - 3 hold - Seated Heel Toe Raises  - 2 x daily - 7 x weekly - 2 sets - 10 reps - 3 hold  ASSESSMENT:  CLINICAL IMPRESSION: Patient is a 63 y.o. M who was seen today for physical therapy evaluation and treatment for s/p CVA, difficulty walking and weakness.   RLE is weaker than the L.  Of more concern is general balance and coordination- didn't score well on Berg and is definitely a high fall risk. TUG and 5XSTS put him at risk for falls as well, with standing balance he tended to be back on his heels and did lose balance twice on the Berg requiring mod A to correct.  Will benefit from skilled PT services to address all findings and assist in return to optimal level of function.   OBJECTIVE IMPAIRMENTS: Abnormal gait, decreased activity tolerance, decreased balance, decreased knowledge of use of DME, decreased mobility, difficulty walking, and decreased  strength.   ACTIVITY LIMITATIONS: sitting, standing, squatting, stairs, transfers, and locomotion level  PARTICIPATION LIMITATIONS: driving, shopping, community activity, occupation, and yard work  PERSONAL FACTORS: Age, Fitness, Past/current experiences, Social background, and Time since onset of injury/illness/exacerbation are also affecting patient's functional outcome.   REHAB POTENTIAL: Good  CLINICAL DECISION MAKING: Stable/uncomplicated  EVALUATION COMPLEXITY: Low   GOALS: Goals reviewed with patient? No  SHORT TERM GOALS: Target date: 03/24/24   Will be compliant with appropriate progressive HEP  Baseline: Goal status: INITIAL  2.  Will score at least 35 on Berg to show improved balance  Baseline:  Goal status: INITIAL  3.  Will be able to name 3 ways to prevent fall at home and in the community  Baseline:  Goal status: INITIAL    LONG TERM GOALS: Target date: 05/27/24    MMT to have improved by one grade in all weak groups Baseline:  Goal status: INITIAL  2.  Will score at least 42 on Berg to show reduced fall risk  Baseline:  Goal status: INITIAL  3.  Will be able to perform all daily dressing/self care activities on an independent basis without LOB  Baseline:  Goal status: INITIAL  4.  Will be able to ambulate at least 511ft in with LRAD to show improved community access, fatigue no more than 3/10 Baseline:  Goal status: INITIAL  5.  Decrease TUG time to 15 seconds Baseline:  Goal status: INITIAL     PLAN:  PT FREQUENCY: 1-2x/week  PT DURATION: 12 weeks  PLANNED INTERVENTIONS: 97164- PT Re-evaluation, 97110-Therapeutic exercises, 97530- Therapeutic activity, [id number]- Neuromuscular re-education, 97535- Self Care, [id number]- Manual therapy, [id number]- Gait training, [phone]- Aquatic Therapy, Patient/Family education, Balance training, Stair training, and Moist heat  PLAN FOR NEXT SESSION: heavy focus on balance, strengthening as able, general  conditioning.  He is at a fall risk but has new issues with speech, I feel that due to the limited visits we will suggest 1x/week for PT and have OT and SLP evaluate him soon to see what they feel they need.  Stacie Glaze, PT 02/25/24 4:52 PM  For all possible CPT codes, reference the Planned Interventions line above.  Check all conditions that are expected to impact treatment: {Conditions expected to impact treatment:Morbid obesity, Diabetes mellitus, Neurological condition and/or seizures, and Social determinants of health   If treatment provided at initial evaluation, no treatment charged due to lack of authorization.

## 2024-02-26 ENCOUNTER — Encounter: Payer: Self-pay | Admitting: Family Medicine

## 2024-02-26 ENCOUNTER — Ambulatory Visit (INDEPENDENT_AMBULATORY_CARE_PROVIDER_SITE_OTHER): Admitting: Family Medicine

## 2024-02-26 ENCOUNTER — Encounter: Payer: Self-pay | Admitting: Speech Pathology

## 2024-02-26 ENCOUNTER — Ambulatory Visit: Admitting: Speech Pathology

## 2024-02-26 VITALS — BP 124/66 | HR 72 | Temp 97.3°F | Ht 64.0 in | Wt 153.2 lb

## 2024-02-26 DIAGNOSIS — I10 Essential (primary) hypertension: Secondary | ICD-10-CM

## 2024-02-26 DIAGNOSIS — Z7984 Long term (current) use of oral hypoglycemic drugs: Secondary | ICD-10-CM

## 2024-02-26 DIAGNOSIS — E785 Hyperlipidemia, unspecified: Secondary | ICD-10-CM

## 2024-02-26 DIAGNOSIS — R471 Dysarthria and anarthria: Secondary | ICD-10-CM

## 2024-02-26 DIAGNOSIS — Z8673 Personal history of transient ischemic attack (TIA), and cerebral infarction without residual deficits: Secondary | ICD-10-CM | POA: Diagnosis not present

## 2024-02-26 DIAGNOSIS — R1312 Dysphagia, oropharyngeal phase: Secondary | ICD-10-CM

## 2024-02-26 DIAGNOSIS — I639 Cerebral infarction, unspecified: Secondary | ICD-10-CM | POA: Diagnosis not present

## 2024-02-26 DIAGNOSIS — B351 Tinea unguium: Secondary | ICD-10-CM | POA: Insufficient documentation

## 2024-02-26 DIAGNOSIS — N182 Chronic kidney disease, stage 2 (mild): Secondary | ICD-10-CM | POA: Diagnosis not present

## 2024-02-26 DIAGNOSIS — E119 Type 2 diabetes mellitus without complications: Secondary | ICD-10-CM | POA: Diagnosis not present

## 2024-02-26 LAB — BASIC METABOLIC PANEL
BUN: 12 mg/dL (ref 6–23)
CO2: 28 meq/L (ref 19–32)
Calcium: 9.5 mg/dL (ref 8.4–10.5)
Chloride: 108 meq/L (ref 96–112)
Creatinine, Ser: 1 mg/dL (ref 0.40–1.50)
GFR: 80.41 mL/min (ref >60.00)
Glucose, Bld: 129 mg/dL — ABNORMAL HIGH (ref 70–99)
Potassium: 3.9 meq/L (ref 3.5–5.1)
Sodium: 146 meq/L — ABNORMAL HIGH (ref 135–145)

## 2024-02-26 LAB — HEMOGLOBIN A1C: Hgb A1c MFr Bld: 8 % — ABNORMAL HIGH (ref 4.6–6.5)

## 2024-02-26 LAB — MAGNESIUM: Magnesium: 1.6 mg/dL (ref 1.5–2.5)

## 2024-02-26 MED ORDER — BLOOD GLUCOSE MONITORING SUPPL DEVI: 1.0000 | Freq: Three times a day (TID) | 0 refills | Status: AC

## 2024-02-26 MED ORDER — LANCET DEVICE MISC: 1.0000 | Freq: Three times a day (TID) | 0 refills | Status: AC

## 2024-02-26 NOTE — Assessment & Plan Note (Signed)
 Continue atorvastatin 20mg  daily

## 2024-02-26 NOTE — Assessment & Plan Note (Signed)
 Continue focus on blood pressure and glucose control, adequate hydration, and avoidance of nephrotoxic medications. Would consider addition of an SGLT2i

## 2024-02-26 NOTE — Assessment & Plan Note (Signed)
 Blood pressure is at goal. Continue amlodipine-benazepril 10-20 mg daily.

## 2024-02-26 NOTE — Assessment & Plan Note (Signed)
 Kenneth [name]'s last A1c was above goal. His therapy was changed while in the hospital. Now only on sitagliptin 100 mg daily. I would consider addition of an SGLT2i in light of microalbuminuria.

## 2024-02-26 NOTE — Therapy (Signed)
 OUTPATIENT SPEECH LANGUAGE PATHOLOGY EVALUATION   Patient Name: Kenneth [name] MRN: [id number] DOB:[birth year], 63 y.o., male Today's Date: 02/26/2024  PCP: Loyola Mast, MD REFERRING PROVIDER: Loyola Mast, MD  END OF SESSION:  End of Session - 02/26/24 1021     Visit Number 1    Number of Visits 8    Date for SLP Re-Evaluation 04/27/24    SLP Start Time 1015    SLP Stop Time  1055    SLP Time Calculation (min) 40 min    Activity Tolerance Patient tolerated treatment well             Past Medical History:  Diagnosis Date   Alcohol dependence (HCC)    Chronic calcific pancreatitis (HCC)    CVA (cerebral vascular accident) (HCC) 01/24/2022   Diabetes mellitus without complication (HCC)    Hyperlipidemia    Hypertension    Melanoma (HCC) 2013   metastatic - resected and cured with chemo/immuno tx   Pancreatic cyst-mass?    Polyneuropathy    Past Surgical History:  Procedure Laterality Date   BIOPSY  02/03/2022   Procedure: BIOPSY;  Surgeon: Iva Boop, MD;  Location: [facility] ENDOSCOPY;  Service: Gastroenterology;;   BRAIN SURGERY  2015   to check for possible malignancy from melanoma, result were scar tissue   COLONOSCOPY WITH PROPOFOL N/A 02/03/2022   Procedure: COLONOSCOPY WITH PROPOFOL;  Surgeon: Iva Boop, MD;  Location: [facility] ENDOSCOPY;  Service: Gastroenterology;  Laterality: N/A;   ESOPHAGOGASTRODUODENOSCOPY (EGD) WITH PROPOFOL N/A 02/03/2022   Procedure: ESOPHAGOGASTRODUODENOSCOPY (EGD) WITH PROPOFOL;  Surgeon: Iva Boop, MD;  Location: [facility] ENDOSCOPY;  Service: Gastroenterology;  Laterality: N/A;   melanoma exicison     right leg   Patient Active Problem List   Diagnosis Date Noted   Onychomycosis 02/26/2024   CKD stage G2/A2, GFR 60-89 and albumin creatinine ratio 30-299 mg/g 02/13/2024   Stroke (cerebrum) (HCC) 02/08/2024   Acute focal neurological deficit 02/04/2024   Peripheral neuropathy 08/22/2023   Osteoarthritis of left knee  08/22/2023   BPV (benign positional vertigo) 02/06/2023   Seborrhea 11/07/2022   History of melanoma 04/12/2022   Alcohol dependence in remission (HCC) 04/12/2022   Aortic atherosclerosis (HCC) 04/12/2022   Alcohol-induced chronic pancreatitis (HCC) 03/15/2022   History of CVA (cerebrovascular accident) 02/28/2022   Gastritis and gastroduodenitis    DNR (do not resuscitate) 02/01/2022   Vertigo 01/24/2022   Elevated PSA 11/24/2021   History of immunotherapy 11/25/2019   Melanoma metastatic to brain (HCC) 08/08/2016   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HCC) 08/13/2014   Metastatic melanoma to lung (HCC) 12/12/2012   Type 2 diabetes mellitus (HCC) 08/20/2007   Dyslipidemia 08/20/2007   Essential hypertension 08/19/2007    ONSET DATE: 02/08/24   REFERRING DIAG:  Free Text Diagnosis  CVA    THERAPY DIAG:  Dysarthria and anarthria  Oropharyngeal dysphagia  Rationale for Evaluation and Treatment: Rehabilitation  SUBJECTIVE:   SUBJECTIVE STATEMENT: Pt was pleasant and cooperative throughout evaluation.   Pt accompanied by: self, sisters are bringing to therapy  PERTINENT HISTORY: Per chart review: 63 y.o. right-handed male with history of HTN, CVA, recent fall due to RLE weakness, EtOH abuse in the past, T2DM, polyneuropathy, melanoma with metastasis to the brain and lung (in remission)   PAIN:  Are you having pain? No  FALLS: Has patient fallen in last 6 months?  No  LIVING ENVIRONMENT: Lives with:  sister [name]) Lives in: House/apartment  PLOF:  Level of assistance:  Independent with ADLs, Independent with IADLs Employment: Other: working on disability. Out of work since CVA in 2023. 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   PATIENT GOALS: speech, swallowing  OBJECTIVE:  Note: Objective measures were completed at Evaluation unless otherwise noted.  DIAGNOSTIC FINDINGS: See EMR for full report.     COGNITION: Overall cognitive status: Within  functional limits for tasks assessed; pt reports no changes. Assessment in CIR deemed cognition WFL.  Areas of impairment:  NA Functional deficits: NA   MOTOR SPEECH: Overall motor speech: impaired Level of impairment: Phrase, Sentence, and Conversation Respiration:  WFL Phonation: normal Resonance: WFL Articulation: Impaired: phrase, sentence, and conversation Intelligibility: Intelligibility reduced Motor planning: Appears intact Motor speech errors:  NA Interfering components:  NA Effective technique: slow rate, increased vocal intensity, and over articulate  ORAL MOTOR EXAMINATION: Overall status: Impaired:   Labial: Right (Symmetry and Strength) Lingual: Right (Symmetry and Strength) Facial: Right (Symmetry and Strength) Velum: ROM Cough: WFL Comments:   RECOMMENDATIONS FROM OBJECTIVE SWALLOW STUDY (MBSS/FEES):    Modified Barium Swallow 02/12/24  Recommendations/Plan: Swallowing Evaluation Recommendations Swallowing Evaluation Recommendations Recommendations: PO diet PO Diet Recommendation: Regular; Mildly thick liquids (Level 2, nectar thick) Liquid Administration via: Spoon; Cup; No straw Medication Administration: Whole meds with puree Supervision: Patient able to self-feed; Intermittent supervision/cueing for swallowing strategies Swallowing strategies  : Minimize environmental distractions; Slow rate; Small bites/sips Postural changes: Position pt fully upright for meals Oral care recommendations: Oral care BID (2x/day) Caregiver Recommendations: Avoid jello, ice cream, thin soups, popsicles; Remove water pitcher  Modified Barium Swallow 02/05/24  Recommendations/Plan: Swallowing Evaluation Recommendations Swallowing Evaluation Recommendations Recommendations: PO diet PO Diet Recommendation: Dysphagia 3 (Mechanical soft); Moderately thick liquids (Level 3, honey thick) Liquid Administration via: Spoon; Cup Medication Administration: Whole meds with  puree Supervision: Patient able to self-feed; Intermittent supervision/cueing for swallowing strategies Swallowing strategies  : Minimize environmental distractions; Slow rate; Small bites/sips Postural changes: Position pt fully upright for meals Oral care recommendations: Oral care BID (2x/day) Caregiver Recommendations: Avoid jello, ice cream, thin soups, popsicles; Remove water pitcher     CLINICAL SWALLOW ASSESSMENT:   Current diet: regular and nectar thick liquids Dentition: adequate natural dentition Patient directly observed with POs: No Feeding: able to feed self Liquids provided by:  Not tested; no straws Oral phase signs and symptoms:  Not tested Pharyngeal phase signs and symptoms:  Not tested Comments: To be assessed next session.    PATIENT REPORTED OUTCOME MEASURES (PROM): EAT-10: 14                                                                                                                            TREATMENT DATE:    PATIENT EDUCATION: Education details: SLP role  Person educated: Patient Education method: Chief Technology Officer Education comprehension: needs further education   GOALS: Goals reviewed with patient? Yes  SHORT TERM GOALS: Target date: 03/28/24  Pt will demonstrate use of  pharyngeal strengthening exercises independently. Baseline: Goal status: INITIAL  2.  Pt will recall safe swallow strategies independently Baseline:  Goal status: INITIAL  3.  Pt will recall speech intelligibility compensations with minA verbal cues Baseline:  Goal status: INITIAL    LONG TERM GOALS: Target date: 04/27/24  Pt will improve score on PROMs Baseline: EAT-10: 14 Goal status: INITIAL  2.  Pt will utilize speech intelligibility compensations in conversation independently. Baseline:  Goal status: INITIAL  3.  Pt will recall 3 environmental modifications for dysarthria Baseline:  Goal status: INITIAL    ASSESSMENT:  CLINICAL  IMPRESSION: Pt is a 63 yo male who presents to ST OP for evaluation post CVA. Pt received therapies in inpatient rehab and discharged last week. Pt endorses difficulties with speech and swallowing. Pt was observed to be ~90% intelligible in conversations in a quiet environment. Pt speech characterized by re: imprecise articulation. Pt reports feeling like his "tongue is too big for his mouth".  Pt to bring food next session for obeservation. Reports no difficulty with solids; however, was discharged on NTL (mildly thick) liquids and [employer] Protocol (FFWP). Pt was able to recall steps to water protocol for safe consumption. SLP provided pt with handout on safe swallow strategies and pharyngeal exercises he was completing in CIR. SLP rec skilled ST services to address dysarthria and dysphagia to maximize functional communication and increased safety with swallowing respectively.     OBJECTIVE IMPAIRMENTS: include dysarthria and dysphagia. These impairments are limiting patient from effectively communicating at home and in community and safety when swallowing. Factors affecting potential to achieve goals and functional outcome are  NA . Patient will benefit from skilled SLP services to address above impairments and improve overall function.  REHAB POTENTIAL: Good; limited by insurance  PLAN:  SLP FREQUENCY: 1-2x/week  SLP DURATION: 8 weeks  PLANNED INTERVENTIONS: Aspiration precaution training, Pharyngeal strengthening exercises, Diet toleration management , Environmental controls, Trials of upgraded texture/liquids, Cueing hierachy, Functional tasks, SLP instruction and feedback, Compensatory strategies, Patient/family education, [phone] Treatment of speech (30 or 45 min) , and [phone] Treatment of swallowing function    [employer], CCC-SLP 02/26/2024, 12:41 PM

## 2024-02-26 NOTE — Assessment & Plan Note (Signed)
 Recent left CVA with dysarthria, right facial drooping, and mild right hemiparesis and paresthesia. Continue DAPT with aspirin 81 mg daily and clopidogrel 75 mg daily. Continue to work with PT, OT, and ST. Follow-up with neurology as scheduled.

## 2024-02-26 NOTE — Progress Notes (Signed)
 [facility] [address] [place] [address] Dept: [phone] Dept Fax: [phone]  Hospital Follow-up Office Visit  Subjective:    Patient ID: Kenneth [name], male    DOB: 09/02/1961, 63 y.o..   MRN: [id number]  Chief Complaint  Patient presents with   Hospitalization Follow-up    Hospital f/u from 02/08/24 for CVA.  No concerns.  Dm/cma    History of Present Illness:  Patient is in today for follow-up from a recent hospitalization. Kenneth [name] was admitted at [facility] on 02/04/2024 with slurred speech, right facial droop, and right arm tingling due to an acute left CVA. On 3/7, he was moved to rehab where he stayed until 3/15. He notes he is still feeling weak on his right, having ongoing impact to his speech, swallowing, and use of his right arm and leg. However, he has seen improvements since the time of the stroke. He is using thickeners for his liquid intake. His sister feels he may not be getting adequate fluid intake.  Kenneth [name] has a history of Type 2 diabetes. He is managed on sitagliptin (Januvia) 100 mg daily.    Kenneth [name] has a history of hypertension. He is managed on amlodipine-benazepril 10-20 mg daily .  Kenneth [name] has dyslipidemia. He is managed on atorvastatin 20 mg daily.  Past Medical History: Patient Active Problem List   Diagnosis Date Noted   CKD stage G2/A2, GFR 60-89 and albumin creatinine ratio 30-299 mg/g 02/13/2024   Stroke (cerebrum) (HCC) 02/08/2024   Acute focal neurological deficit 02/04/2024   Peripheral neuropathy 08/22/2023   Osteoarthritis of left knee 08/22/2023   BPV (benign positional vertigo) 02/06/2023   Seborrhea 11/07/2022   History of melanoma 04/12/2022   Alcohol dependence in remission (HCC) 04/12/2022   Aortic atherosclerosis (HCC) 04/12/2022   Alcohol-induced chronic pancreatitis (HCC) 03/15/2022   History of CVA (cerebrovascular accident) 02/28/2022    Gastritis and gastroduodenitis    DNR (do not resuscitate) 02/01/2022   Vertigo 01/24/2022   Elevated PSA 11/24/2021   History of immunotherapy 11/25/2019   Melanoma metastatic to brain (HCC) 08/08/2016   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HCC) 08/13/2014   Metastatic melanoma to lung (HCC) 12/12/2012   Type 2 diabetes mellitus (HCC) 08/20/2007   Dyslipidemia 08/20/2007   Essential hypertension 08/19/2007   Past Surgical History:  Procedure Laterality Date   BIOPSY  02/03/2022   Procedure: BIOPSY;  Surgeon: Iva Boop, MD;  Location: [facility] ENDOSCOPY;  Service: Gastroenterology;;   BRAIN SURGERY  2015   to check for possible malignancy from melanoma, result were scar tissue   COLONOSCOPY WITH PROPOFOL N/A 02/03/2022   Procedure: COLONOSCOPY WITH PROPOFOL;  Surgeon: Iva Boop, MD;  Location: [facility] ENDOSCOPY;  Service: Gastroenterology;  Laterality: N/A;   ESOPHAGOGASTRODUODENOSCOPY (EGD) WITH PROPOFOL N/A 02/03/2022   Procedure: ESOPHAGOGASTRODUODENOSCOPY (EGD) WITH PROPOFOL;  Surgeon: Iva Boop, MD;  Location: [facility] ENDOSCOPY;  Service: Gastroenterology;  Laterality: N/A;   melanoma exicison     right leg   Family History  Problem Relation Age of Onset   Heart disease Father    Prostate cancer Father    Cancer Maternal Grandmother        Brain   Stomach cancer Paternal Grandmother    Colon cancer Neg Hx    Esophageal cancer Neg Hx    Rectal cancer Neg Hx    Outpatient Medications Prior to Visit  Medication Sig Dispense Refill   amLODipine-benazepril (LOTREL) 10-20 MG capsule Take  1 capsule by mouth daily. 90 capsule 3   aspirin EC 81 MG tablet Take 1 tablet (81 mg total) by mouth daily. Swallow whole. 100 tablet 0   atorvastatin (LIPITOR) 20 MG tablet Take 1 tablet (20 mg total) by mouth daily. 30 tablet 0   B Complex-C (B-COMPLEX WITH VITAMIN C) tablet Take 1 tablet by mouth daily. 30 tablet 0   clopidogrel (PLAVIX) 75 MG tablet Take 1 tablet (75 mg total) by mouth daily. 30 tablet  1   diclofenac Sodium (VOLTAREN) 1 % GEL Apply 2 g topically 4 (four) times daily. 100 g 0   Glucose Blood (BLOOD GLUCOSE TEST STRIPS) STRP Use to test blood sugar three times daily as directed. 100 strip 0   ketoconazole (NIZORAL) 2 % cream Apply topically 2 (two) times daily. 15 g 0   lactobacillus acidophilus (BACID) TABS tablet Take 1 tablet by mouth daily. Art therapist. MISC 1 each by Does not apply route in the morning, at noon, and at bedtime. May substitute to any manufacturer covered by patient's insurance. 100 each 0   magnesium oxide (MAG-OX) 400 MG tablet Take 0.5 tablets (200 mg total) by mouth at bedtime. 30 tablet 0   mirtazapine (REMERON) 7.5 MG tablet TAKE 1 TABLET BY MOUTH DAILY AT BEDTIME 90 tablet 3   Multiple Vitamin (MULTIVITAMIN WITH MINERALS) TABS tablet Take 1 tablet by mouth daily.     pantoprazole (PROTONIX) 20 MG tablet Take 1 tablet (20 mg total) by mouth daily. 30 tablet 0   sitaGLIPtin (JANUVIA) 100 MG tablet Take 1 tablet (100 mg total) by mouth daily. (Patient taking differently: Take 100 mg by mouth every evening.) 90 tablet 3   tamsulosin (FLOMAX) 0.4 MG CAPS capsule Take 1 capsule (0.4 mg total) by mouth daily after supper.     triamcinolone cream (KENALOG) 0.5 % Apply topically 2 (two) times daily as needed (facial rash). 30 g 0   vitamin D3 (CHOLECALCIFEROL) 25 MCG tablet Take 3 tablets (3,000 Units total) by mouth daily. 90 tablet 0   Blood Glucose Monitoring Suppl DEVI 1 each by Does not apply route in the morning, at noon, and at bedtime. May substitute to any manufacturer covered by patient's insurance. 1 each 0   l-methylfolate-B6-B12 (METANX) 3-35-2 MG TABS tablet Take 1 tablet by mouth daily. 60 tablet 1   Lancet Device MISC 1 each by Does not apply route in the morning, at noon, and at bedtime. May substitute to any manufacturer covered by patient's insurance. 1 each 0   No facility-administered medications prior to visit.   Allergies   Allergen Reactions   Cat Dander Shortness Of Breath and Swelling    Swelling, watery eyes     Objective:   Today's Vitals   02/26/24 0834  BP: 124/66  Pulse: 72  Temp: (!) 97.3 F (36.3 C)  TempSrc: Temporal  SpO2: 99%  Weight: 153 lb 3.2 oz (69.5 kg)  Height: 5\' 4"  (1.626 m)   Body mass index is 26.3 kg/m.   General: Well developed, well nourished. No acute distress. Neuro: Speech is mildly slurred/dysarthric. No sign of facial droop.  Right arm strength is good.   Ambulating with a walker. Psych: Alert and oriented. Normal mood and affect.  Health Maintenance Due  Topic Date Due   Pneumococcal Vaccine 41-45 Years old (1 of 2 - PCV) Never done   OPHTHALMOLOGY EXAM  Never done   Zoster Vaccines- Shingrix (1 of 2)  Never done   DTaP/Tdap/Td (2 - Td or Tdap) 10/17/2023     Assessment & Plan:   Problem List Items Addressed This Visit       Cardiovascular and Mediastinum   Essential hypertension - Primary   Blood pressure is at goal. Continue amlodipine-benazepril 10-20 mg daily.      Stroke (cerebrum) (HCC)   Recent left CVA with dysarthria, right facial drooping, and mild right hemiparesis and paresthesia. Continue DAPT with aspirin 81 mg daily and clopidogrel 75 mg daily. Continue to work with PT, OT, and ST. Follow-up with neurology as scheduled.      Relevant Orders   Basic metabolic panel (Completed)   Magnesium (Completed)     Endocrine   Type 2 diabetes mellitus (HCC)   Kenneth [name]'s last A1c was above goal. His therapy was changed while in the hospital. Now only on sitagliptin 100 mg daily. I would consider addition of an SGLT2i in light of microalbuminuria.       Relevant Medications   Blood Glucose Monitoring Suppl DEVI   Lancet Device MISC   Other Relevant Orders   Basic metabolic panel (Completed)   Hemoglobin A1c (Completed)     Musculoskeletal and Integument   Onychomycosis   Referral to podiatry for ongoing foot care.      Relevant  Orders   Ambulatory referral to Podiatry     Genitourinary   CKD stage G2/A2, GFR 60-89 and albumin creatinine ratio 30-299 mg/g   Continue focus on blood pressure and glucose control, adequate hydration, and avoidance of nephrotoxic medications. Would consider addition of an SGLT2i      Relevant Orders   Basic metabolic panel (Completed)     Other   Dyslipidemia   Continue atorvastatin 20 mg daily.       Return in about 3 months (around 05/28/2024) for Reassessment.   Loyola Mast, MD

## 2024-02-26 NOTE — Assessment & Plan Note (Signed)
 Referral to podiatry for ongoing foot care.

## 2024-03-03 ENCOUNTER — Encounter: Admitting: Speech Pathology

## 2024-03-03 ENCOUNTER — Ambulatory Visit: Admitting: Occupational Therapy

## 2024-03-03 ENCOUNTER — Ambulatory Visit: Admitting: Speech Pathology

## 2024-03-03 ENCOUNTER — Encounter: Payer: Self-pay | Admitting: Speech Pathology

## 2024-03-03 DIAGNOSIS — R1312 Dysphagia, oropharyngeal phase: Secondary | ICD-10-CM

## 2024-03-03 DIAGNOSIS — R208 Other disturbances of skin sensation: Secondary | ICD-10-CM

## 2024-03-03 DIAGNOSIS — M6281 Muscle weakness (generalized): Secondary | ICD-10-CM

## 2024-03-03 DIAGNOSIS — I639 Cerebral infarction, unspecified: Secondary | ICD-10-CM | POA: Diagnosis not present

## 2024-03-03 DIAGNOSIS — R2681 Unsteadiness on feet: Secondary | ICD-10-CM

## 2024-03-03 DIAGNOSIS — R471 Dysarthria and anarthria: Secondary | ICD-10-CM

## 2024-03-03 DIAGNOSIS — R278 Other lack of coordination: Secondary | ICD-10-CM

## 2024-03-03 NOTE — Therapy (Unsigned)
 OUTPATIENT SPEECH LANGUAGE PATHOLOGY TREATMENT  Patient Name: Kenneth [name] MRN: [id number] DOB:Sep 09, 1961, 63 y.o., male Today's Date: 03/03/2024  PCP: Loyola Mast, MD REFERRING PROVIDER: Loyola Mast, MD  END OF SESSION:  End of Session - 03/03/24 1240     Visit Number 2    Number of Visits 8    Date for SLP Re-Evaluation 04/27/24    SLP Start Time 1230    SLP Stop Time  1310    SLP Time Calculation (min) 40 min    Activity Tolerance Patient tolerated treatment well             Past Medical History:  Diagnosis Date   Alcohol dependence (HCC)    Chronic calcific pancreatitis (HCC)    CVA (cerebral vascular accident) (HCC) 01/24/2022   Diabetes mellitus without complication (HCC)    Hyperlipidemia    Hypertension    Melanoma (HCC) 2013   metastatic - resected and cured with chemo/immuno tx   Pancreatic cyst-mass?    Polyneuropathy    Past Surgical History:  Procedure Laterality Date   BIOPSY  02/03/2022   Procedure: BIOPSY;  Surgeon: Iva Boop, MD;  Location: [facility] ENDOSCOPY;  Service: Gastroenterology;;   BRAIN SURGERY  2015   to check for possible malignancy from melanoma, result were scar tissue   COLONOSCOPY WITH PROPOFOL N/A 02/03/2022   Procedure: COLONOSCOPY WITH PROPOFOL;  Surgeon: Iva Boop, MD;  Location: [facility] ENDOSCOPY;  Service: Gastroenterology;  Laterality: N/A;   ESOPHAGOGASTRODUODENOSCOPY (EGD) WITH PROPOFOL N/A 02/03/2022   Procedure: ESOPHAGOGASTRODUODENOSCOPY (EGD) WITH PROPOFOL;  Surgeon: Iva Boop, MD;  Location: [facility] ENDOSCOPY;  Service: Gastroenterology;  Laterality: N/A;   melanoma exicison     right leg   Patient Active Problem List   Diagnosis Date Noted   Onychomycosis 02/26/2024   CKD stage G2/A2, GFR 60-89 and albumin creatinine ratio 30-299 mg/g 02/13/2024   Stroke (cerebrum) (HCC) 02/08/2024   Acute focal neurological deficit 02/04/2024   Peripheral neuropathy 08/22/2023   Osteoarthritis of left knee  08/22/2023   BPV (benign positional vertigo) 02/06/2023   Seborrhea 11/07/2022   History of melanoma 04/12/2022   Alcohol dependence in remission (HCC) 04/12/2022   Aortic atherosclerosis (HCC) 04/12/2022   Alcohol-induced chronic pancreatitis (HCC) 03/15/2022   History of CVA (cerebrovascular accident) 02/28/2022   Gastritis and gastroduodenitis    DNR (do not resuscitate) 02/01/2022   Vertigo 01/24/2022   Elevated PSA 11/24/2021   History of immunotherapy 11/25/2019   Melanoma metastatic to brain (HCC) 08/08/2016   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HCC) 08/13/2014   Metastatic melanoma to lung (HCC) 12/12/2012   Type 2 diabetes mellitus (HCC) 08/20/2007   Dyslipidemia 08/20/2007   Essential hypertension 08/19/2007    ONSET DATE: 02/08/24   REFERRING DIAG:  Free Text Diagnosis  CVA    THERAPY DIAG:  Dysarthria and anarthria  Oropharyngeal dysphagia  Rationale for Evaluation and Treatment: Rehabilitation  SUBJECTIVE:   SUBJECTIVE STATEMENT: Pt was pleasant and cooperative throughout evaluation.   Pt accompanied by: self, sisters are bringing to therapy  PERTINENT HISTORY: Per chart review: 63 y.o. right-handed male with history of HTN, CVA, recent fall due to RLE weakness, EtOH abuse in the past, T2DM, polyneuropathy, melanoma with metastasis to the brain and lung (in remission)   PAIN:  Are you having pain? No  FALLS: Has patient fallen in last 6 months?  No  LIVING ENVIRONMENT: Lives with:  sister [name]) Lives in: House/apartment  PLOF:  Level of assistance: Independent  with ADLs, Independent with IADLs Employment: Other: working on disability. Out of work since CVA in 2023. 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   PATIENT GOALS: speech, swallowing  OBJECTIVE:  Note: Objective measures were completed at Evaluation unless otherwise noted.  DIAGNOSTIC FINDINGS: See EMR for full report.     COGNITION: Overall cognitive status: Within  functional limits for tasks assessed; pt reports no changes. Assessment in CIR deemed cognition WFL.  Areas of impairment:  NA Functional deficits: NA   MOTOR SPEECH: Overall motor speech: impaired Level of impairment: Phrase, Sentence, and Conversation Respiration:  WFL Phonation: normal Resonance: WFL Articulation: Impaired: phrase, sentence, and conversation Intelligibility: Intelligibility reduced Motor planning: Appears intact Motor speech errors:  NA Interfering components:  NA Effective technique: slow rate, increased vocal intensity, and over articulate  ORAL MOTOR EXAMINATION: Overall status: Impaired:   Labial: Right (Symmetry and Strength) Lingual: Right (Symmetry and Strength) Facial: Right (Symmetry and Strength) Velum: ROM Cough: WFL Comments:   RECOMMENDATIONS FROM OBJECTIVE SWALLOW STUDY (MBSS/FEES):    Modified Barium Swallow 02/12/24  Recommendations/Plan: Swallowing Evaluation Recommendations Swallowing Evaluation Recommendations Recommendations: PO diet PO Diet Recommendation: Regular; Mildly thick liquids (Level 2, nectar thick) Liquid Administration via: Spoon; Cup; No straw Medication Administration: Whole meds with puree Supervision: Patient able to self-feed; Intermittent supervision/cueing for swallowing strategies Swallowing strategies  : Minimize environmental distractions; Slow rate; Small bites/sips Postural changes: Position pt fully upright for meals Oral care recommendations: Oral care BID (2x/day) Caregiver Recommendations: Avoid jello, ice cream, thin soups, popsicles; Remove water pitcher  Modified Barium Swallow 02/05/24  Recommendations/Plan: Swallowing Evaluation Recommendations Swallowing Evaluation Recommendations Recommendations: PO diet PO Diet Recommendation: Dysphagia 3 (Mechanical soft); Moderately thick liquids (Level 3, honey thick) Liquid Administration via: Spoon; Cup Medication Administration: Whole meds with  puree Supervision: Patient able to self-feed; Intermittent supervision/cueing for swallowing strategies Swallowing strategies  : Minimize environmental distractions; Slow rate; Small bites/sips Postural changes: Position pt fully upright for meals Oral care recommendations: Oral care BID (2x/day) Caregiver Recommendations: Avoid jello, ice cream, thin soups, popsicles; Remove water pitcher     CLINICAL SWALLOW ASSESSMENT:   Current diet: regular and nectar thick liquids Dentition: adequate natural dentition Patient directly observed with POs: No Feeding: able to feed self Liquids provided by:  Not tested; no straws Oral phase signs and symptoms:  Not tested Pharyngeal phase signs and symptoms:  Not tested Comments: To be assessed next session.    PATIENT REPORTED OUTCOME MEASURES (PROM): EAT-10: 14                                                                                                                            TREATMENT DATE:   03/03/24: Pt reports 80% back to baseline at this time - slurred when tired. Pt reports he tried to complete his exercises at home. He reports he completed exercises twice since last being seen - SLP encouraged to complete everyday. SLP encouraged pt  to try and create routine to ensure exercises completion.   CTAR: x10  - 5 second hold  Masako x10  - required extra time to complete; difficult to initiate. Trialed tongue at different places.   Effortful Swallow  -Visibly, SLP did not see any increased effort - though, pt reported it felt different. To continue practice.   SLP observed pt with regular and NTL trial. Pt was able to masticate effectively. No residue observed after liquid wash. No solids were left in buccal cavity. Pt performed a finger sweep to check for pocketing due to decreased sensation.   FWWP    PATIENT EDUCATION: Education details: SLP role  Person educated: Patient Education method: Chief Technology Officer Education  comprehension: needs further education   GOALS: Goals reviewed with patient? Yes  SHORT TERM GOALS: Target date: 03/28/24  Pt will demonstrate use of pharyngeal strengthening exercises independently. Baseline: Goal status: INITIAL  2.  Pt will recall safe swallow strategies independently Baseline:  Goal status: INITIAL  3.  Pt will recall speech intelligibility compensations with minA verbal cues Baseline:  Goal status: INITIAL    LONG TERM GOALS: Target date: 04/27/24  Pt will improve score on PROMs Baseline: EAT-10: 14 Goal status: INITIAL  2.  Pt will utilize speech intelligibility compensations in conversation independently. Baseline:  Goal status: INITIAL  3.  Pt will recall 3 environmental modifications for dysarthria Baseline:  Goal status: INITIAL    ASSESSMENT:  CLINICAL IMPRESSION: Pt is a 63 yo male who presents to ST OP for evaluation post CVA. Pt received therapies in inpatient rehab and discharged last week. Pt endorses difficulties with speech and swallowing. Pt was observed to be ~90% intelligible in conversations in a quiet environment. Pt speech characterized by re: imprecise articulation. Pt reports feeling like his "tongue is too big for his mouth".  Pt to bring food next session for obeservation. Reports no difficulty with solids; however, was discharged on NTL (mildly thick) liquids and [employer] Protocol (FFWP). Pt was able to recall steps to water protocol for safe consumption. SLP provided pt with handout on safe swallow strategies and pharyngeal exercises he was completing in CIR. SLP rec skilled ST services to address dysarthria and dysphagia to maximize functional communication and increased safety with swallowing respectively.     OBJECTIVE IMPAIRMENTS: include dysarthria and dysphagia. These impairments are limiting patient from effectively communicating at home and in community and safety when swallowing. Factors affecting potential  to achieve goals and functional outcome are  NA . Patient will benefit from skilled SLP services to address above impairments and improve overall function.  REHAB POTENTIAL: Good; limited by insurance  PLAN:  SLP FREQUENCY: 1-2x/week  SLP DURATION: 8 weeks  PLANNED INTERVENTIONS: Aspiration precaution training, Pharyngeal strengthening exercises, Diet toleration management , Environmental controls, Trials of upgraded texture/liquids, Cueing hierachy, Functional tasks, SLP instruction and feedback, Compensatory strategies, Patient/family education, [phone] Treatment of speech (30 or 45 min) , and [phone] Treatment of swallowing function    [employer], CCC-SLP 03/03/2024, 12:40 PM

## 2024-03-03 NOTE — Therapy (Addendum)
 OUTPATIENT OCCUPATIONAL THERAPY NEURO EVALUATION  Patient Name: Kenneth [name] MRN: [id number] DOB:[birth year], 63 y.o., male Today's Date: 03/03/2024  PCP: Dr. Veto Kemps REFERRING PROVIDER: Dr. Carlis Abbott  END OF SESSION:  OT End of Session - 03/03/24 1400     Visit Number 1    Number of Visits 12    Date for OT Re-Evaluation 05/26/24    Authorization Type [employer] Medicaid    OT Start Time 1315    OT Stop Time 1352    OT Time Calculation (min) 37 min             Past Medical History:  Diagnosis Date   Alcohol dependence (HCC)    Chronic calcific pancreatitis (HCC)    CVA (cerebral vascular accident) (HCC) 01/24/2022   Diabetes mellitus without complication (HCC)    Hyperlipidemia    Hypertension    Melanoma (HCC) 2013   metastatic - resected and cured with chemo/immuno tx   Pancreatic cyst-mass?    Polyneuropathy    Past Surgical History:  Procedure Laterality Date   BIOPSY  02/03/2022   Procedure: BIOPSY;  Surgeon: Iva Boop, MD;  [facility] ENDOSCOPY;  Service: Gastroenterology;;   BRAIN SURGERY  2015   to check for possible malignancy from melanoma, result were scar tissue   COLONOSCOPY WITH PROPOFOL N/A 02/03/2022   Procedure: COLONOSCOPY WITH PROPOFOL;  Surgeon: Iva Boop, MD;  Location: [facility] ENDOSCOPY;  Service: Gastroenterology;  Laterality: N/A;   ESOPHAGOGASTRODUODENOSCOPY (EGD) WITH PROPOFOL N/A 02/03/2022   Procedure: ESOPHAGOGASTRODUODENOSCOPY (EGD) WITH PROPOFOL;  Surgeon: Iva Boop, MD;  Location: [facility] ENDOSCOPY;  Service: Gastroenterology;  Laterality: N/A;   melanoma exicison     right leg   Patient Active Problem List   Diagnosis Date Noted   Onychomycosis 02/26/2024   CKD stage G2/A2, GFR 60-89 and albumin creatinine ratio 30-299 mg/g 02/13/2024   Stroke (cerebrum) (HCC) 02/08/2024   Acute focal neurological deficit 02/04/2024   Peripheral neuropathy 08/22/2023   Osteoarthritis of left knee 08/22/2023   BPV (benign positional vertigo)  02/06/2023   Seborrhea 11/07/2022   History of melanoma 04/12/2022   Alcohol dependence in remission (HCC) 04/12/2022   Aortic atherosclerosis (HCC) 04/12/2022   Alcohol-induced chronic pancreatitis (HCC) 03/15/2022   History of CVA (cerebrovascular accident) 02/28/2022   Gastritis and gastroduodenitis    DNR (do not resuscitate) 02/01/2022   Vertigo 01/24/2022   Elevated PSA 11/24/2021   History of immunotherapy 11/25/2019   Melanoma metastatic to brain (HCC) 08/08/2016   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HCC) 08/13/2014   Metastatic melanoma to lung (HCC) 12/12/2012   Type 2 diabetes mellitus (HCC) 08/20/2007   Dyslipidemia 08/20/2007   Essential hypertension 08/19/2007    ONSET DATE: referral 02/14/24  REFERRING DIAG: CVA  THERAPY DIAG:  Other lack of coordination - Plan: Ot plan of care cert/re-cert  Unsteadiness on feet - Plan: Ot plan of care cert/re-cert  Muscle weakness (generalized) - Plan: Ot plan of care cert/re-cert  Other disturbances of skin sensation - Plan: Ot plan of care cert/re-cert  Rationale for Evaluation and Treatment: Rehabilitation  SUBJECTIVE:   SUBJECTIVE STATEMENT: Pt reports he lives with his sister Pt accompanied by: self  PERTINENT HISTORY: Kenneth [name] is a 63 y.o. male who presented to [facility] ED with c/o slurred speech, facial droop, and R arm tingling. MRI on 02/04/2024 revealed Two adjacent acute infarcts within the left corona radiata, left lentiform nucleus and posterior limb of left internal capsule. PMHx includes alcohol dependence, chronic calcific pancreatitis,  CVA, DM, HLD, HTN, melanoma, polyneuropathy  PRECAUTIONS: Fall  WEIGHT BEARING RESTRICTIONS: No  PAIN:  Are you having pain? No  FALLS: Has patient fallen in last 6 months? No  LIVING ENVIRONMENT: Lives with: lives with their family Lives in: House/apartment Stairs: Yes: External: 4 steps;  Has following equipment at home: Single point cane, Walker - 2 wheeled, and shower  chair  PLOF: Independent  PATIENT GOALS: get back to where I was before  OBJECTIVE:  Note: Objective measures were completed at Evaluation unless otherwise noted.  HAND DOMINANCE: Right  ADLs: Overall ADLs: mod I with basic ADLS Transfers/ambulation related to ADLs: Eating: using LUE predominantly for eating, pt did htis before CVA, difficulty with cutting Grooming: currently using non dominant LUE UB Dressing: mod I LB Dressing: mod I Toileting: mod I Bathing: mod I Tub Shower transfers: mod I Equipment: Shower seat without back, grab bar   IADLs: Shopping: dependent Light housekeeping: dependent Meal Prep: has not attempted , previously microwave Community mobility: mod I with rollator Medication management: sister assists Financial management: sister assists Handwriting: 90% legible, with increased time  MOBILITY STATUS:  mod I with rollator     FUNCTIONAL OUTCOME MEASURES: Quick Dash: 52.5 % disability  UPPER EXTREMITY ROM:  LUE WFLS  Active ROM Right eval Left eval  Shoulder flexion 95   Shoulder abduction    Shoulder adduction    Shoulder extension    Shoulder internal rotation    Shoulder external rotation    Elbow flexion WFL   Elbow extension WFLs   Wrist flexion    Wrist extension    Wrist ulnar deviation    Wrist radial deviation    Wrist pronation WFLs   Wrist supination 90%   (Blank rows = not tested)  UPPER EXTREMITY MMT:     MMT Right eval Left eval  Shoulder flexion 3/5 4/5  Shoulder abduction    Shoulder adduction    Shoulder extension    Shoulder internal rotation    Shoulder external rotation    Middle trapezius    Lower trapezius    Elbow flexion 4/5 4+/5  Elbow extension 4/5 4+/5  Wrist flexion    Wrist extension    Wrist ulnar deviation    Wrist radial deviation    Wrist pronation    Wrist supination    (Blank rows = not tested)  HAND FUNCTION: Grip strength: Right: 26 lbs; Left: 30 lbs  COORDINATION: 9 Hole  Peg test: Right: 69.13 sec; Left: 37.64 sec Box and Blocks:  Right 32blocks, Left 43blocks  SENSATION: Light touch: Impaired RUE    COGNITION: Overall cognitive status: Within functional limits for tasks assessed    VISION ASSESSMENT: Not tested- denies changes      OBSERVATIONS: Pleasant gentleman motivated to improve  TREATMENT DATE: 03/03/24- eval         PATIENT EDUCATION: Education details: role of OT, potential goals, recommendation for flipping cards and picking up coins for coordination Person educated: Patient Education method: Explanation Education comprehension: verbalized understanding  HOME EXERCISE PROGRAM: not formally issued   GOALS: Goals reviewed with patient? Yes  SHORT TERM GOALS: Target date: 04/02/24  I with HEP Baseline:dependent Goal status: INITIAL  2.  Pt will demonstrate improved RUE functional use as evidenced by increasing box/ blocks score by 4 blocks. Baseline: RUE 32 blocks, LUE 43 blocks Goal status: INITIAL  3.  Pt will demonstrate improved fine motor coordiantion for ADLs as evidenced by decreasing 9 hole peg test score to 64 secs or less Baseline: RUE 69.13, LUE 37.64 Goal status: INITIAL  4.  Pt will increase RUE grip strength to 30 lbs or greater for increased functional use. Baseline: RUE 26 lbs, LUE 30 lbs Goal status: INITIAL  5.  I with sensory precautions Baseline: dependent Goal status: INITIAL  6.  Pt will use his RUE to brush his teeth at least 75% of the time Baseline: uses LUE Goal status: INITIAL  LONG TERM GOALS: Target date: 05/26/24  I with updated HEP Baseline: dependent Goal status: INITIAL  2.  Pt will perform basic home mnagement modified independently Baseline: dependent Goal status: INITIAL  3.  Pt will demonstrate improved fine motor coordiantion for ADLs as  evidenced by decreasing 9 hole peg test score to 56 secs or less Baseline: RUE 69.13, LUE 37.64 Goal status: INITIAL   4.  Pt will improve Quick Dash score to 45% or better disability Baseline: 52.5% Goal status: INITIAL  5.  Pt will report increased ease cutting food with RUE Baseline: pt reports difficulty cutting food with RUE Goal status: INITIAL  6.  Pt will write a sentence with 100% legibility using RUE. Baseline: 90% legibile Goal status: INITIAL  ASSESSMENT:  CLINICAL IMPRESSION: Kenneth [name] is a 63 y.o. male who  hospitalized with c/o slurred speech, facial droop, and R arm tingling. MRI on 02/04/2024 revealed Two adjacent acute infarcts within the left corona radiata, left lentiform nucleus and posterior limb of left internal capsule. PMHx includes alcohol dependence, chronic calcific pancreatitis, CVA, DM, HLD, HTN, melanoma, polyneuropathy, history of disloated R hsoulder per pt report. Pt presents with the perfromance deficits below. He can benefit from skilled occupational therapy to address these deficits in order to maximize pt's safety and I with ADLs/ IADLs.   PERFORMANCE DEFICITS: in functional skills including ADLs, IADLs, coordination, dexterity, sensation, ROM, strength, pain, flexibility, Fine motor control, Gross motor control, mobility, balance, endurance, decreased knowledge of precautions, decreased knowledge of use of DME, and UE functional use, , and psychosocial skills including coping strategies, environmental adaptation, habits, interpersonal interactions, and routines and behaviors.   IMPAIRMENTS: are limiting patient from ADLs, IADLs, rest and sleep, work, play, leisure, and social participation.   CO-MORBIDITIES: may have co-morbidities  that affects occupational performance. Patient will benefit from skilled OT to address above impairments and improve overall function.  MODIFICATION OR ASSISTANCE TO COMPLETE EVALUATION: No modification of tasks or  assist necessary to complete an evaluation.  OT OCCUPATIONAL PROFILE AND HISTORY: Detailed assessment: Review of records and additional review of physical, cognitive, psychosocial history related to current functional performance.  CLINICAL DECISION MAKING: LOW - limited treatment options, no task modification necessary  REHAB POTENTIAL: Good  EVALUATION COMPLEXITY: Low    PLAN:  OT FREQUENCY: 1x/week  OT DURATION: 12 weeks, anticipate wrapping up after 6-8 visits due to insurance limitations, and based upon progress.  PLANNED INTERVENTIONS: 97168 OT Re-evaluation, 97535 self care/ADL training, 91478 therapeutic exercise, 97530 therapeutic activity, 97112 neuromuscular re-education, 97140 manual therapy, 97035 ultrasound, 97018 paraffin, 29562 moist heat, 97010 cryotherapy, 97014 electrical stimulation unattended, passive range of motion, functional mobility training, energy conservation, coping strategies training, patient/family education, and DME and/or AE instructions  RECOMMENDED OTHER SERVICES: PT, ST  CONSULTED AND AGREED WITH PLAN OF CARE: Patient  PLAN FOR NEXT SESSION: initial HEP for coordination and closed chain cane   Ysabelle Goodroe, OT 03/03/2024, 2:32 PM

## 2024-03-07 ENCOUNTER — Telehealth: Payer: Self-pay

## 2024-03-07 NOTE — Telephone Encounter (Signed)
 Patient is needing a new handicap placard.     Call patient's sister , [name] when ready.

## 2024-03-10 NOTE — Telephone Encounter (Signed)
 Nina notified VIA phone and will pick it up at the office.  Placed up front for pick up.  Dm/cma

## 2024-03-11 ENCOUNTER — Encounter: Payer: Self-pay | Admitting: Podiatry

## 2024-03-11 ENCOUNTER — Ambulatory Visit (INDEPENDENT_AMBULATORY_CARE_PROVIDER_SITE_OTHER): Admitting: Podiatry

## 2024-03-11 VITALS — Ht 64.0 in | Wt 153.2 lb

## 2024-03-11 DIAGNOSIS — B351 Tinea unguium: Secondary | ICD-10-CM

## 2024-03-11 DIAGNOSIS — M79674 Pain in right toe(s): Secondary | ICD-10-CM | POA: Diagnosis not present

## 2024-03-11 DIAGNOSIS — M79675 Pain in left toe(s): Secondary | ICD-10-CM

## 2024-03-11 NOTE — Progress Notes (Unsigned)
       Subjective:  Patient ID: Kenneth [name], male    DOB: Jul 31, 1961,  MRN: [id number]   Kenneth [name] presents to clinic today for:  Chief Complaint  Patient presents with   Nail Problem    Pt is here for [facility].   Patient notes nails are thick, discolored, elongated and painful in shoegear when trying to ambulate.    PCP is Rudd, Bertram Millard, MD.  Past Medical History:  Diagnosis Date   Alcohol dependence [facility])    Chronic calcific pancreatitis (HCC)    CVA (cerebral vascular accident) (HCC) 01/24/2022   Diabetes mellitus without complication (HCC)    Hyperlipidemia    Hypertension    Melanoma (HCC) 2013   metastatic - resected and cured with chemo/immuno tx   Pancreatic cyst-mass?    Polyneuropathy     Past Surgical History:  Procedure Laterality Date   BIOPSY  02/03/2022   Procedure: BIOPSY;  Surgeon: Iva Boop, MD;  Location: [facility] ENDOSCOPY;  Service: Gastroenterology;;   BRAIN SURGERY  2015   to check for possible malignancy from melanoma, result were scar tissue   COLONOSCOPY WITH PROPOFOL N/A 02/03/2022   Procedure: COLONOSCOPY WITH PROPOFOL;  Surgeon: Iva Boop, MD;  Location: [facility] ENDOSCOPY;  Service: Gastroenterology;  Laterality: N/A;   ESOPHAGOGASTRODUODENOSCOPY (EGD) WITH PROPOFOL N/A 02/03/2022   Procedure: ESOPHAGOGASTRODUODENOSCOPY (EGD) WITH PROPOFOL;  Surgeon: Iva Boop, MD;  Location: [facility] ENDOSCOPY;  Service: Gastroenterology;  Laterality: N/A;   melanoma exicison     right leg    Allergies  Allergen Reactions   Cat Dander Shortness Of Breath and Swelling    Swelling, watery eyes    Review of Systems: Negative except as noted in the HPI.  Objective:  Kenneth [name] is a pleasant 63 y.o. male in NAD. AAO x 3.  Vascular Examination: Capillary refill time is 3-5 seconds to toes bilateral. Palpable pedal pulses b/l LE. Digital hair present b/l.  Skin temperature gradient WNL b/l. No varicosities b/l. No cyanosis noted b/l.    Dermatological Examination: Pedal skin with normal turgor, texture and tone b/l. No open wounds. No interdigital macerations b/l. Toenails x10 are 3mm thick, discolored, dystrophic with subungual debris. There is pain with compression of the nail plates.  They are elongated x10.  The right hallux nail is gryphotic and 8mm thick.     Latest Ref Rng & Units 02/26/2024    9:08 AM 02/05/2024    4:47 AM 10/29/2023    2:09 PM 08/22/2023    1:57 PM 04/13/2023   10:31 AM  Hemoglobin A1C  Hemoglobin-A1c 4.6 - 6.5 % 8.0  8.8  9.0  7.3  8.0    Assessment/Plan: 1. Pain due to onychomycosis of toenails of both feet     The mycotic toenails were sharply debrided x10 with sterile nail nippers and a power debriding burr to decrease bulk/thickness and length.    Return in about 3 months (around 06/10/2024) for [facility].   Clerance Lav, DPM, FACFAS [facility]     [address] [place], [place] [address]                Office [phone]  Fax [phone]

## 2024-03-12 ENCOUNTER — Ambulatory Visit: Attending: Physical Medicine and Rehabilitation | Admitting: Speech Pathology

## 2024-03-12 ENCOUNTER — Ambulatory Visit: Admitting: Occupational Therapy

## 2024-03-12 ENCOUNTER — Encounter: Payer: Self-pay | Admitting: Speech Pathology

## 2024-03-12 DIAGNOSIS — M6281 Muscle weakness (generalized): Secondary | ICD-10-CM

## 2024-03-12 DIAGNOSIS — R208 Other disturbances of skin sensation: Secondary | ICD-10-CM | POA: Diagnosis present

## 2024-03-12 DIAGNOSIS — R1312 Dysphagia, oropharyngeal phase: Secondary | ICD-10-CM | POA: Diagnosis present

## 2024-03-12 DIAGNOSIS — R471 Dysarthria and anarthria: Secondary | ICD-10-CM | POA: Insufficient documentation

## 2024-03-12 DIAGNOSIS — R262 Difficulty in walking, not elsewhere classified: Secondary | ICD-10-CM | POA: Insufficient documentation

## 2024-03-12 DIAGNOSIS — R2681 Unsteadiness on feet: Secondary | ICD-10-CM | POA: Diagnosis present

## 2024-03-12 DIAGNOSIS — R278 Other lack of coordination: Secondary | ICD-10-CM | POA: Diagnosis present

## 2024-03-12 DIAGNOSIS — Z9181 History of falling: Secondary | ICD-10-CM | POA: Insufficient documentation

## 2024-03-12 NOTE — Therapy (Unsigned)
 OUTPATIENT SPEECH LANGUAGE PATHOLOGY TREATMENT  Patient Name: Kenneth [name] MRN: [id number] DOB:1961-04-08, 63 y.o., male Today's Date: 03/12/2024  PCP: Kenneth Mast, MD REFERRING PROVIDER: Loyola Mast, MD  END OF SESSION:  End of Session - 03/12/24 1020     Visit Number 3    Number of Visits 8    Date for SLP Re-Evaluation 04/27/24    SLP Start Time 1015    SLP Stop Time  1055    SLP Time Calculation (min) 40 min    Activity Tolerance Patient tolerated treatment well             Past Medical History:  Diagnosis Date   Alcohol dependence (HCC)    Chronic calcific pancreatitis (HCC)    CVA (cerebral vascular accident) (HCC) 01/24/2022   Diabetes mellitus without complication (HCC)    Hyperlipidemia    Hypertension    Melanoma (HCC) 2013   metastatic - resected and cured with chemo/immuno tx   Pancreatic cyst-mass?    Polyneuropathy    Past Surgical History:  Procedure Laterality Date   BIOPSY  02/03/2022   Procedure: BIOPSY;  Surgeon: Kenneth Boop, MD;  Location: [facility] ENDOSCOPY;  Service: Gastroenterology;;   BRAIN SURGERY  2015   to check for possible malignancy from melanoma, result were scar tissue   COLONOSCOPY WITH PROPOFOL N/A 02/03/2022   Procedure: COLONOSCOPY WITH PROPOFOL;  Surgeon: Kenneth Boop, MD;  Location: [facility] ENDOSCOPY;  Service: Gastroenterology;  Laterality: N/A;   ESOPHAGOGASTRODUODENOSCOPY (EGD) WITH PROPOFOL N/A 02/03/2022   Procedure: ESOPHAGOGASTRODUODENOSCOPY (EGD) WITH PROPOFOL;  Surgeon: Kenneth Boop, MD;  Location: [facility] ENDOSCOPY;  Service: Gastroenterology;  Laterality: N/A;   melanoma exicison     right leg   Patient Active Problem List   Diagnosis Date Noted   Onychomycosis 02/26/2024   CKD stage G2/A2, GFR 60-89 and albumin creatinine ratio 30-299 mg/g 02/13/2024   Stroke (cerebrum) (HCC) 02/08/2024   Acute focal neurological deficit 02/04/2024   Peripheral neuropathy 08/22/2023   Osteoarthritis of left knee  08/22/2023   BPV (benign positional vertigo) 02/06/2023   Seborrhea 11/07/2022   History of melanoma 04/12/2022   Alcohol dependence in remission (HCC) 04/12/2022   Aortic atherosclerosis (HCC) 04/12/2022   Alcohol-induced chronic pancreatitis (HCC) 03/15/2022   History of CVA (cerebrovascular accident) 02/28/2022   Gastritis and gastroduodenitis    DNR (do not resuscitate) 02/01/2022   Vertigo 01/24/2022   Elevated PSA 11/24/2021   History of immunotherapy 11/25/2019   Melanoma metastatic to brain (HCC) 08/08/2016   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HCC) 08/13/2014   Metastatic melanoma to lung (HCC) 12/12/2012   Type 2 diabetes mellitus (HCC) 08/20/2007   Dyslipidemia 08/20/2007   Essential hypertension 08/19/2007    ONSET DATE: 02/08/24   REFERRING DIAG:  Free Text Diagnosis  CVA    THERAPY DIAG:  Dysarthria and anarthria  Oropharyngeal dysphagia  Rationale for Evaluation and Treatment: Rehabilitation  SUBJECTIVE:   SUBJECTIVE STATEMENT: Pt brought in food for today's session.   Pt accompanied by: self, sisters are bringing to therapy  PERTINENT HISTORY: Per chart review: 64 y.o. right-handed male with history of HTN, CVA, recent fall due to RLE weakness, EtOH abuse in the past, T2DM, polyneuropathy, melanoma with metastasis to the brain and lung (in remission)   PAIN:  Are you having pain? No  FALLS: Has patient fallen in last 6 months?  No  LIVING ENVIRONMENT: Lives with:  sister Kenneth [name]) Lives in: House/apartment  PLOF:  Level of assistance: Independent  with ADLs, Independent with IADLs Employment: Other: working on disability. Out of work since CVA in 2023. 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   PATIENT GOALS: speech, swallowing  OBJECTIVE:  Note: Objective measures were completed at Evaluation unless otherwise noted.  DIAGNOSTIC FINDINGS: See EMR for full report.     COGNITION: Overall cognitive status: Within functional limits for  tasks assessed; pt reports no changes. Assessment in CIR deemed cognition WFL.  Areas of impairment:  NA Functional deficits: NA   MOTOR SPEECH: Overall motor speech: impaired Level of impairment: Phrase, Sentence, and Conversation Respiration:  WFL Phonation: normal Resonance: WFL Articulation: Impaired: phrase, sentence, and conversation Intelligibility: Intelligibility reduced Motor planning: Appears intact Motor speech errors:  NA Interfering components:  NA Effective technique: slow rate, increased vocal intensity, and over articulate  ORAL MOTOR EXAMINATION: Overall status: Impaired:   Labial: Right (Symmetry and Strength) Lingual: Right (Symmetry and Strength) Facial: Right (Symmetry and Strength) Velum: ROM Cough: WFL Comments:   RECOMMENDATIONS FROM OBJECTIVE SWALLOW STUDY (MBSS/FEES):    Modified Barium Swallow 02/12/24  Recommendations/Plan: Swallowing Evaluation Recommendations Swallowing Evaluation Recommendations Recommendations: PO diet PO Diet Recommendation: Regular; Mildly thick liquids (Level 2, nectar thick) Liquid Administration via: Spoon; Cup; No straw Medication Administration: Whole meds with puree Supervision: Patient able to self-feed; Intermittent supervision/cueing for swallowing strategies Swallowing strategies  : Minimize environmental distractions; Slow rate; Small bites/sips Postural changes: Position pt fully upright for meals Oral care recommendations: Oral care BID (2x/day) Caregiver Recommendations: Avoid jello, ice cream, thin soups, popsicles; Remove water pitcher  Modified Barium Swallow 02/05/24  Recommendations/Plan: Swallowing Evaluation Recommendations Swallowing Evaluation Recommendations Recommendations: PO diet PO Diet Recommendation: Dysphagia 3 (Mechanical soft); Moderately thick liquids (Level 3, honey thick) Liquid Administration via: Spoon; Cup Medication Administration: Whole meds with puree Supervision: Patient  able to self-feed; Intermittent supervision/cueing for swallowing strategies Swallowing strategies  : Minimize environmental distractions; Slow rate; Small bites/sips Postural changes: Position pt fully upright for meals Oral care recommendations: Oral care BID (2x/day) Caregiver Recommendations: Avoid jello, ice cream, thin soups, popsicles; Remove water pitcher     CLINICAL SWALLOW ASSESSMENT:   Current diet: regular and nectar thick liquids Dentition: adequate natural dentition Patient directly observed with POs: No Feeding: able to feed self Liquids provided by:  Not tested; no straws Oral phase signs and symptoms:  Not tested Pharyngeal phase signs and symptoms:  Not tested Comments: To be assessed next session.    PATIENT REPORTED OUTCOME MEASURES (PROM): EAT-10: 14                                                                                                                            TREATMENT DATE:   03/12/24: Pt was seen for skilled ST services targeting dysphagia and dysarthria. Pt forgot toothbrush, so unable to do liquid trials (FFWP) this session. Pt reports he has been completing all swallowing exercises once a day. Pt reports speech has been "alright", but  that he has to repeat himself throughout day ~5x. SLP educated on "Be Clear" strategies. SLP assisted pt in developing functional phrases to practice throughout the day. SLP educated on when he would be most likely to use strategies re: drive thru, fatigued, phone. Pt to continue practice at home.    03/03/24: Pt was seen for skilled ST services targeting dysphagia. Of note, pt reports his speech is 80% back to baseline at this time - more slurred when tired. When prompted, pt reports he has only completed exercises twice since last being seen - SLP encouraged to complete everyday. SLP suggested creating a routine to ensure exercises completion.   Completed the following exercises: CTAR: x10  - 5 second hold  Masako  x10  - required extra time to complete; difficult to initiate. Trialed tongue at different places.   Effortful Swallow  -Visibly, SLP did not see any increased effort - though, pt reported it felt different. To continue practice.   Pt completed oral care prior to water trials today. SLP observed immediate and delayed coughs after every sip of water. SLP reviewed Shirlee More Protocol and the importance of completing oral care prior to water trials. SLP observed pt with regular and NTL trial. Pt was able to masticate effectively. No residue observed after liquid wash. No solids were left in buccal cavity. Pt performed a finger sweep to check for pocketing due to decreased sensation. Safe to continue regular and NTL diet.      PATIENT EDUCATION: Education details: SLP role  Person educated: Patient Education method: Chief Technology Officer Education comprehension: needs further education   GOALS: Goals reviewed with patient? Yes  SHORT TERM GOALS: Target date: 03/28/24  Pt will demonstrate use of pharyngeal strengthening exercises independently. Baseline: Goal status: INITIAL  2.  Pt will recall safe swallow strategies independently Baseline:  Goal status: INITIAL  3.  Pt will recall speech intelligibility compensations with minA verbal cues Baseline:  Goal status: INITIAL    LONG TERM GOALS: Target date: 04/27/24  Pt will improve score on PROMs Baseline: EAT-10: 14 Goal status: INITIAL  2.  Pt will utilize speech intelligibility compensations in conversation independently. Baseline:  Goal status: INITIAL  3.  Pt will recall 3 environmental modifications for dysarthria Baseline:  Goal status: INITIAL    ASSESSMENT:  CLINICAL IMPRESSION: Pt is a 63 yo male who presents to ST OP for evaluation post CVA. Pt received therapies in inpatient rehab and discharged last week. Pt endorses difficulties with speech and swallowing. Pt was observed to be ~90% intelligible  in conversations in a quiet environment. Pt speech characterized by re: imprecise articulation. Pt reports feeling like his "tongue is too big for his mouth".  Pt to bring food next session for obeservation. Reports no difficulty with solids; however, was discharged on NTL (mildly thick) liquids and [employer] Protocol (FFWP). Pt was able to recall steps to water protocol for safe consumption. SLP provided pt with handout on safe swallow strategies and pharyngeal exercises he was completing in CIR. SLP rec skilled ST services to address dysarthria and dysphagia to maximize functional communication and increased safety with swallowing respectively.     OBJECTIVE IMPAIRMENTS: include dysarthria and dysphagia. These impairments are limiting patient from effectively communicating at home and in community and safety when swallowing. Factors affecting potential to achieve goals and functional outcome are  NA . Patient will benefit from skilled SLP services to address above impairments and improve overall function.  REHAB  POTENTIAL: Good; limited by insurance  PLAN:  SLP FREQUENCY: 1-2x/week  SLP DURATION: 8 weeks  PLANNED INTERVENTIONS: Aspiration precaution training, Pharyngeal strengthening exercises, Diet toleration management , Environmental controls, Trials of upgraded texture/liquids, Cueing hierachy, Functional tasks, SLP instruction and feedback, Compensatory strategies, Patient/family education, [phone] Treatment of speech (30 or 45 min) , and [id number] Treatment of swallowing function    [employer], CCC-SLP 03/12/2024, 10:21 AM

## 2024-03-12 NOTE — Therapy (Unsigned)
 OUTPATIENT OCCUPATIONAL THERAPY NEURO EVALUATION  Patient Name: Kenneth [name] MRN: [id number] DOB:09-27-1961, 63 y.o., male Today's Date: 03/12/2024  PCP: Dr. Veto Kemps REFERRING PROVIDER: Dr. Carlis Abbott  END OF SESSION:    Past Medical History:  Diagnosis Date   Alcohol dependence (HCC)    Chronic calcific pancreatitis (HCC)    CVA (cerebral vascular accident) (HCC) 01/24/2022   Diabetes mellitus without complication (HCC)    Hyperlipidemia    Hypertension    Melanoma (HCC) 2013   metastatic - resected and cured with chemo/immuno tx   Pancreatic cyst-mass?    Polyneuropathy    Past Surgical History:  Procedure Laterality Date   BIOPSY  02/03/2022   Procedure: BIOPSY;  Surgeon: Iva Boop, MD;  Location: [facility] ENDOSCOPY;  Service: Gastroenterology;;   BRAIN SURGERY  2015   to check for possible malignancy from melanoma, result were scar tissue   COLONOSCOPY WITH PROPOFOL N/A 02/03/2022   Procedure: COLONOSCOPY WITH PROPOFOL;  Surgeon: Iva Boop, MD;  Location: [facility] ENDOSCOPY;  Service: Gastroenterology;  Laterality: N/A;   ESOPHAGOGASTRODUODENOSCOPY (EGD) WITH PROPOFOL N/A 02/03/2022   Procedure: ESOPHAGOGASTRODUODENOSCOPY (EGD) WITH PROPOFOL;  Surgeon: Iva Boop, MD;  Location: [facility] ENDOSCOPY;  Service: Gastroenterology;  Laterality: N/A;   melanoma exicison     right leg   Patient Active Problem List   Diagnosis Date Noted   Onychomycosis 02/26/2024   CKD stage G2/A2, GFR 60-89 and albumin creatinine ratio 30-299 mg/g 02/13/2024   Stroke (cerebrum) (HCC) 02/08/2024   Acute focal neurological deficit 02/04/2024   Peripheral neuropathy 08/22/2023   Osteoarthritis of left knee 08/22/2023   BPV (benign positional vertigo) 02/06/2023   Seborrhea 11/07/2022   History of melanoma 04/12/2022   Alcohol dependence in remission (HCC) 04/12/2022   Aortic atherosclerosis (HCC) 04/12/2022   Alcohol-induced chronic pancreatitis (HCC) 03/15/2022   History of CVA  (cerebrovascular accident) 02/28/2022   Gastritis and gastroduodenitis    DNR (do not resuscitate) 02/01/2022   Vertigo 01/24/2022   Elevated PSA 11/24/2021   History of immunotherapy 11/25/2019   Melanoma metastatic to brain (HCC) 08/08/2016   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HCC) 08/13/2014   Metastatic melanoma to lung (HCC) 12/12/2012   Type 2 diabetes mellitus (HCC) 08/20/2007   Dyslipidemia 08/20/2007   Essential hypertension 08/19/2007    ONSET DATE: referral 02/14/24  REFERRING DIAG: CVA  THERAPY DIAG:  No diagnosis found.  Rationale for Evaluation and Treatment: Rehabilitation  SUBJECTIVE:   SUBJECTIVE STATEMENT: Pt reports he lives with his sister Pt accompanied by: self  PERTINENT HISTORY: Kenneth [name] is a 63 y.o. male who presented to [facility] ED with c/o slurred speech, facial droop, and R arm tingling. MRI on 02/04/2024 revealed Two adjacent acute infarcts within the left corona radiata, left lentiform nucleus and posterior limb of left internal capsule. PMHx includes alcohol dependence, chronic calcific pancreatitis, CVA, DM, HLD, HTN, melanoma, polyneuropathy  PRECAUTIONS: Fall  WEIGHT BEARING RESTRICTIONS: No  PAIN:  Are you having pain? No  FALLS: Has patient fallen in last 6 months? No  LIVING ENVIRONMENT: Lives with: lives with their family Lives in: House/apartment Stairs: Yes: External: 4 steps;  Has following equipment at home: Single point cane, Walker - 2 wheeled, and shower chair  PLOF: Independent  PATIENT GOALS: get back to where I was before  OBJECTIVE:  Note: Objective measures were completed at Evaluation unless otherwise noted.  HAND DOMINANCE: Right  ADLs: Overall ADLs: mod I with basic ADLS Transfers/ambulation related to ADLs: Eating: using LUE predominantly  for eating, pt did htis before CVA, difficulty with cutting Grooming: currently using non dominant LUE UB Dressing: mod I LB Dressing: mod I Toileting: mod I Bathing: mod I Tub  Shower transfers: mod I Equipment: Shower seat without back, grab bar   IADLs: Shopping: dependent Light housekeeping: dependent Meal Prep: has not attempted , previously microwave Community mobility: mod I with rollator Medication management: sister assists Financial management: sister assists Handwriting: 90% legible, with increased time  MOBILITY STATUS:  mod I with rollator     FUNCTIONAL OUTCOME MEASURES: Quick Dash: 52.5 % disability  UPPER EXTREMITY ROM:  LUE WFLS  Active ROM Right eval Left eval  Shoulder flexion 95   Shoulder abduction    Shoulder adduction    Shoulder extension    Shoulder internal rotation    Shoulder external rotation    Elbow flexion WFL   Elbow extension WFLs   Wrist flexion    Wrist extension    Wrist ulnar deviation    Wrist radial deviation    Wrist pronation WFLs   Wrist supination 90%   (Blank rows = not tested)  UPPER EXTREMITY MMT:     MMT Right eval Left eval  Shoulder flexion 3/5 4/5  Shoulder abduction    Shoulder adduction    Shoulder extension    Shoulder internal rotation    Shoulder external rotation    Middle trapezius    Lower trapezius    Elbow flexion 4/5 4+/5  Elbow extension 4/5 4+/5  Wrist flexion    Wrist extension    Wrist ulnar deviation    Wrist radial deviation    Wrist pronation    Wrist supination    (Blank rows = not tested)  HAND FUNCTION: Grip strength: Right: 26 lbs; Left: 30 lbs  COORDINATION: 9 Hole Peg test: Right: 69.13 sec; Left: 37.64 sec Box and Blocks:  Right 32blocks, Left 43blocks  SENSATION: Light touch: Impaired RUE    COGNITION: Overall cognitive status: Within functional limits for tasks assessed    VISION ASSESSMENT: Not tested- denies changes      OBSERVATIONS: Pleasant gentleman motivated to improve                                                                                                                             TREATMENT DATE: 03/12/24-  Flipping and dealing playing cards with RUE, rotating ball in hand  for increased fine motor coordination, min difficulty/ v.c Placing metal pegs 3 sizes into pegboard with RUE then removing with in hand manipulation  03/03/24- eval         PATIENT EDUCATION: Education details: role of OT, potential goals, recommendation for flipping cards and picking up coins for coordination Person educated: Patient Education method: Explanation Education comprehension: verbalized understanding  HOME EXERCISE PROGRAM: not formally issued   GOALS: Goals reviewed with patient? Yes  SHORT TERM GOALS: Target date: 04/02/24  I with HEP Baseline:dependent Goal status: INITIAL  2.  Pt will demonstrate improved RUE functional use as evidenced by increasing box/ blocks score by 4 blocks. Baseline: RUE 32 blocks, LUE 43 blocks Goal status: INITIAL  3.  Pt will demonstrate improved fine motor coordiantion for ADLs as evidenced by decreasing 9 hole peg test score to 64 secs or less Baseline: RUE 69.13, LUE 37.64 Goal status: INITIAL  4.  Pt will increase RUE grip strength to 30 lbs or greater for increased functional use. Baseline: RUE 26 lbs, LUE 30 lbs Goal status: INITIAL  5.  I with sensory precautions Baseline: dependent Goal status: INITIAL  6.  Pt will use his RUE to brush his teeth at least 75% of the time Baseline: uses LUE Goal status: INITIAL  LONG TERM GOALS: Target date: 05/26/24  I with updated HEP Baseline: dependent Goal status:ongoing  2.  Pt will perform basic home mnagement modified independently Baseline: dependent Goal status: INITIAL  3.  Pt will demonstrate improved fine motor coordiantion for ADLs as evidenced by decreasing 9 hole peg test score to 56 secs or less Baseline: RUE 69.13, LUE 37.64 Goal status: INITIAL   4.  Pt will improve Quick Dash score to 45% or better disability Baseline: 52.5% Goal status: INITIAL  5.  Pt will report increased ease  cutting food with RUE Baseline: pt reports difficulty cutting food with RUE Goal status: INITIAL  6.  Pt will write a sentence with 100% legibility using RUE. Baseline: 90% legibile Goal status: INITIAL  ASSESSMENT:  CLINICAL IMPRESSION: Kenneth [name] is a 63 y.o. male who  hospitalized with c/o slurred speech, facial droop, and R arm tingling. MRI on 02/04/2024 revealed Two adjacent acute infarcts within the left corona radiata, left lentiform nucleus and posterior limb of left internal capsule. PMHx includes alcohol dependence, chronic calcific pancreatitis, CVA, DM, HLD, HTN, melanoma, polyneuropathy, history of disloated R hsoulder per pt report. Pt presents with the perfromance deficits below. He can benefit from skilled occupational therapy to address these deficits in order to maximize pt's safety and I with ADLs/ IADLs.   PERFORMANCE DEFICITS: in functional skills including ADLs, IADLs, coordination, dexterity, sensation, ROM, strength, pain, flexibility, Fine motor control, Gross motor control, mobility, balance, endurance, decreased knowledge of precautions, decreased knowledge of use of DME, and UE functional use, , and psychosocial skills including coping strategies, environmental adaptation, habits, interpersonal interactions, and routines and behaviors.   IMPAIRMENTS: are limiting patient from ADLs, IADLs, rest and sleep, work, play, leisure, and social participation.   CO-MORBIDITIES: may have co-morbidities  that affects occupational performance. Patient will benefit from skilled OT to address above impairments and improve overall function.  MODIFICATION OR ASSISTANCE TO COMPLETE EVALUATION: No modification of tasks or assist necessary to complete an evaluation.  OT OCCUPATIONAL PROFILE AND HISTORY: Detailed assessment: Review of records and additional review of physical, cognitive, psychosocial history related to current functional performance.  CLINICAL DECISION MAKING:  LOW - limited treatment options, no task modification necessary  REHAB POTENTIAL: Good  EVALUATION COMPLEXITY: Low    PLAN:  OT FREQUENCY: 1x/week  OT DURATION: 12 weeks, anticipate wrapping up after 6-8 visits due to insurance limitations, and based upon progress.  PLANNED INTERVENTIONS: 97168 OT Re-evaluation, 97535 self care/ADL training, 54098 therapeutic exercise, 97530 therapeutic activity, 97112 neuromuscular re-education, 97140 manual therapy, 97035 ultrasound, 97018 paraffin, 11914 moist heat, 97010 cryotherapy, 97014 electrical stimulation unattended, passive range of motion, functional mobility training, energy conservation, coping strategies training, patient/family education, and DME and/or AE instructions  RECOMMENDED OTHER SERVICES: PT, ST  CONSULTED AND AGREED WITH PLAN OF CARE: Patient  PLAN FOR NEXT SESSION:    Dariona Postma, OT 03/12/2024, 11:04 AM

## 2024-03-12 NOTE — Patient Instructions (Signed)
 Sit for all exercises SCAPULA: Retraction- Chest press    Hold cane with both hands. Pinch shoulder blades together. Do not shrug shoulders. Hold _5__ seconds. 10-20 reps 1x day Copyright  VHI. All rights reserved.        SHOULDER: Flexion - Sitting    Hold cane with both hands. Raise arms up. Keep elbows straight. no weight. Hold __5_ seconds _10-20__ reps per set, __1_ sets per day, ___7 days per week  Copyright  VHI. All rights reserved.    With arms straight, hold cane forward at waist. Raise cane above head. Hold 3 seconds. Repeat 10 times. Do 2 times per day.   Holding wand with left hand palm up, push wand directly out to side, leading with other hand palm down, until stretch is felt. Hold 5 seconds. Repeat 10 times per set. Do 1 sessions per day. Copyright  VHI. All rights reserved.

## 2024-03-13 NOTE — Therapy (Signed)
 OUTPATIENT PHYSICAL THERAPY LOWER EXTREMITY TREATMENT   Patient Name: Kenneth [name] MRN: [id number] DOB:1961-06-09, 63 y.o., male Today's Date: 03/14/2024  END OF SESSION:  PT End of Session - 03/14/24 0932     Visit Number 2    Date for PT Re-Evaluation 05/27/24    Authorization Type [employer] MCD    PT Start Time 0930    PT Stop Time 1015    PT Time Calculation (min) 45 min    Equipment Utilized During Treatment Gait belt    Activity Tolerance Patient tolerated treatment well    Behavior During Therapy WFL for tasks assessed/performed               Past Medical History:  Diagnosis Date   Alcohol dependence (HCC)    Chronic calcific pancreatitis (HCC)    CVA (cerebral vascular accident) (HCC) 01/24/2022   Diabetes mellitus without complication (HCC)    Hyperlipidemia    Hypertension    Melanoma (HCC) 2013   metastatic - resected and cured with chemo/immuno tx   Pancreatic cyst-mass?    Polyneuropathy    Past Surgical History:  Procedure Laterality Date   BIOPSY  02/03/2022   Procedure: BIOPSY;  Surgeon: Iva Boop, MD;  Location: [facility] ENDOSCOPY;  Service: Gastroenterology;;   BRAIN SURGERY  2015   to check for possible malignancy from melanoma, result were scar tissue   COLONOSCOPY WITH PROPOFOL N/A 02/03/2022   Procedure: COLONOSCOPY WITH PROPOFOL;  Surgeon: Iva Boop, MD;  Location: [facility] ENDOSCOPY;  Service: Gastroenterology;  Laterality: N/A;   ESOPHAGOGASTRODUODENOSCOPY (EGD) WITH PROPOFOL N/A 02/03/2022   Procedure: ESOPHAGOGASTRODUODENOSCOPY (EGD) WITH PROPOFOL;  Surgeon: Iva Boop, MD;  Location: [facility] ENDOSCOPY;  Service: Gastroenterology;  Laterality: N/A;   melanoma exicison     right leg   Patient Active Problem List   Diagnosis Date Noted   Onychomycosis 02/26/2024   CKD stage G2/A2, GFR 60-89 and albumin creatinine ratio 30-299 mg/g 02/13/2024   Stroke (cerebrum) (HCC) 02/08/2024   Acute focal neurological deficit 02/04/2024   Peripheral  neuropathy 08/22/2023   Osteoarthritis of left knee 08/22/2023   BPV (benign positional vertigo) 02/06/2023   Seborrhea 11/07/2022   History of melanoma 04/12/2022   Alcohol dependence in remission (HCC) 04/12/2022   Aortic atherosclerosis (HCC) 04/12/2022   Alcohol-induced chronic pancreatitis (HCC) 03/15/2022   History of CVA (cerebrovascular accident) 02/28/2022   Gastritis and gastroduodenitis    DNR (do not resuscitate) 02/01/2022   Vertigo 01/24/2022   Elevated PSA 11/24/2021   History of immunotherapy 11/25/2019   Melanoma metastatic to brain (HCC) 08/08/2016   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HCC) 08/13/2014   Metastatic melanoma to lung (HCC) 12/12/2012   Type 2 diabetes mellitus (HCC) 08/20/2007   Dyslipidemia 08/20/2007   Essential hypertension 08/19/2007    PCP: Loyola Mast, MD  REFERRING PROVIDER: Carlis Abbott, MD  REFERRING DIAG: Diagnosis R29.898 (ICD-10-CM) - Right leg weakness Z86.73 (ICD-10-CM) - History of CVA (cerebrovascular accident)  THERAPY DIAG:  Muscle weakness (generalized)  Other lack of coordination  Unsteadiness on feet  Difficulty in walking, not elsewhere classified  Rationale for Evaluation and Treatment: Rehabilitation  ONSET DATE: 02/04/24  SUBJECTIVE:   SUBJECTIVE STATEMENT:  I am doing okay, could be worse. No fall, no nothing.    Patient was being seen here for leg pain and weakness in February.  He was admitted to the hospital on 02/04/24, was discharged on 02/16/24.   Kenneth [name] is a 63 y.o. male who presented to  [facility] ED with c/o slurred speech, facial droop, and R arm tingling. MRI on 02/04/2024 revealed Two adjacent acute infarcts within the left corona radiata, left lentiform nucleus and posterior limb of left internal capsule. PMHx includes alcohol dependence, chronic calcific pancreatitis, CVA, DM, HLD, HTN, melanoma, polyneuropathy   PERTINENT HISTORY: See above  PAIN:  Are you having pain? No  PRECAUTIONS: Fall  RED  FLAGS: None   WEIGHT BEARING RESTRICTIONS: No  FALLS:  Has patient fallen in last 6 months? Yes, 1 LIVING ENVIRONMENT: Lives with: lives with their family Lives in: House/apartment- townhouse  Stairs: No Has following equipment at home: Environmental consultant - 2 wheeled and Wheelchair (manual)  OCCUPATION: not working   PLOF: Independent with gait, Independent with transfers, and Requires assistive device for independence  PATIENT GOALS: to be able to get around on my own completely, be more independent   NEXT MD VISIT: [place] tomorrow, neurologist April 28th  OBJECTIVE:  Note: Objective measures were completed at Evaluation unless otherwise noted.  DIAGNOSTIC FINDINGS:   02/04/24 IMPRESSION: 1. Two adjacent acute infarcts within the left corona radiata, left lentiform nucleus and posterior limb of left internal capsule (individually measuring up to 2 cm). 2. Moderate-sized focus of chronic cortical encephalomalacia/gliosis within the anterolateral left frontal lobe (deep to a cranioplasty). 3. Background parenchymal atrophy, chronic small vessel ischemic disease and chronic lacunar infarcts, as described. 4. Several chronic microhemorrhages in a distribution suggesting sequelae of hypertensive microangiopathy. 5. Mild inflammatory left maxillary sinus disease. COGNITION: Overall cognitive status: Within functional limits for tasks assessed, slurred speech     SENSATION: Not tested hx of neuropathy   COORDINATION  Impaired coordination R LE, facial droop right, slurred speech and difficulty blinking right eye   LOWER EXTREMITY MMT:  MMT Right eval Left eval  Hip flexion 3+ 4+  Hip extension    Hip abduction 4- 5  Hip adduction    Hip internal rotation    Hip external rotation    Knee flexion 4- 5  Knee extension 4- 5  Ankle dorsiflexion 4- 4-  Ankle plantarflexion    Ankle inversion    Ankle eversion     (Blank rows = not tested)   FUNCTIONAL TESTS:  TUG: 26 seconds  with 4WW 5 times sit to stand: 25 seconds use of BUEs on chair, posterior unsteadiness/poor eccentric control  Berg: 24/56 3 minute walk test: 350 feet with 4WW, c/o fatigue and the right foot started to drag  GAIT: Distance walked: 377ft Assistive device utilized: 4WW Level of assistance: SBA Comments:  limited WB RLE, limited ankle DF and knee ROM during gait cycle , mild toe drag when fatigued                                                                                                                                TREATMENT DATE:  03/14/24 NuStep L5x2mins  STS 2x10 LAQ 2# 2x10 Standing marches 2# 20  reps alt x2 HS curls green 2x10 Box taps 4" by stairs 1HRA, then x10 alt without  TUG- 23s   PATIENT EDUCATION:  Education details: exam, POC, HEP  Person educated: Patient Education method: Explanation, Demonstration, and Handouts Education comprehension: verbalized understanding, returned demonstration, and needs further education  HOME EXERCISE PROGRAM: Access Code: [email or web address] URL: [email or web address] Date: 02/25/2024 Prepared by: Stacie Glaze  Exercises - Seated Long Arc Quad  - 2 x daily - 7 x weekly - 2 sets - 10 reps - 3 hold - Seated March  - 2 x daily - 7 x weekly - 2 sets - 10 reps - 3 hold - Seated Heel Toe Raises  - 2 x daily - 7 x weekly - 2 sets - 10 reps - 3 hold  ASSESSMENT:  CLINICAL IMPRESSION:  Patient returns for first PT session almost 3 weeks after eval. He is still ambulating with rollator. Denies any falls. He is still very unsteady with gait. We worked on some light strengthening and some functional tasks today. He has some LOB with STS. Decreased balance with box taps on 4", needs minA to do without railing support.    Patient is a 63 y.o. M who was seen today for physical therapy evaluation and treatment for s/p CVA, difficulty walking and weakness.   RLE is weaker than the L.  Of more concern is general balance and  coordination- didn't score well on Berg and is definitely a high fall risk. TUG and 5XSTS put him at risk for falls as well, with standing balance he tended to be back on his heels and did lose balance twice on the Berg requiring mod A to correct.  Will benefit from skilled PT services to address all findings and assist in return to optimal level of function.   OBJECTIVE IMPAIRMENTS: Abnormal gait, decreased activity tolerance, decreased balance, decreased knowledge of use of DME, decreased mobility, difficulty walking, and decreased strength.   ACTIVITY LIMITATIONS: sitting, standing, squatting, stairs, transfers, and locomotion level  PARTICIPATION LIMITATIONS: driving, shopping, community activity, occupation, and yard work  PERSONAL FACTORS: Age, Fitness, Past/current experiences, Social background, and Time since onset of injury/illness/exacerbation are also affecting patient's functional outcome.   REHAB POTENTIAL: Good  CLINICAL DECISION MAKING: Stable/uncomplicated  EVALUATION COMPLEXITY: Low   GOALS: Goals reviewed with patient? No  SHORT TERM GOALS: Target date: 03/24/24   Will be compliant with appropriate progressive HEP  Baseline: Goal status: progressing 03/14/24  2.  Will score at least 35 on Berg to show improved balance  Baseline:  Goal status: INITIAL  3.  Will be able to name 3 ways to prevent fall at home and in the community  Baseline:  Goal status: no carpets, no loose rugs, has a lights in room so can see at night MET 03/14/24    LONG TERM GOALS: Target date: 05/27/24    MMT to have improved by one grade in all weak groups Baseline:  Goal status: INITIAL  2.  Will score at least 42 on Berg to show reduced fall risk  Baseline:  Goal status: INITIAL  3.  Will be able to perform all daily dressing/self care activities on an independent basis without LOB  Baseline:  Goal status: INITIAL  4.  Will be able to ambulate at least 579ft in with LRAD to  show improved community access, fatigue no more than 3/10 Baseline:  Goal status: INITIAL  5.  Decrease TUG time to 15 seconds Baseline:  Goal status:  23s 03/14/24     PLAN:  PT FREQUENCY: 1-2x/week  PT DURATION: 12 weeks  PLANNED INTERVENTIONS: 97164- PT Re-evaluation, 97110-Therapeutic exercises, 97530- Therapeutic activity, 97112- Neuromuscular re-education, 97535- Self Care, 09811- Manual therapy, [phone]- Gait training, [phone]- Aquatic Therapy, Patient/Family education, Balance training, Stair training, and Moist heat  PLAN FOR NEXT SESSION: heavy focus on balance, strengthening as able, general conditioning.  Cassie Freer, PT, DPT 03/14/24 10:16 AM  For all possible CPT codes, reference the Planned Interventions line above.     Check all conditions that are expected to impact treatment: {Conditions expected to impact treatment:Morbid obesity, Diabetes mellitus, Neurological condition and/or seizures, and Social determinants of health   If treatment provided at initial evaluation, no treatment charged due to lack of authorization.

## 2024-03-14 ENCOUNTER — Ambulatory Visit

## 2024-03-14 DIAGNOSIS — R2681 Unsteadiness on feet: Secondary | ICD-10-CM

## 2024-03-14 DIAGNOSIS — R471 Dysarthria and anarthria: Secondary | ICD-10-CM | POA: Diagnosis not present

## 2024-03-14 DIAGNOSIS — M6281 Muscle weakness (generalized): Secondary | ICD-10-CM

## 2024-03-14 DIAGNOSIS — R278 Other lack of coordination: Secondary | ICD-10-CM

## 2024-03-14 DIAGNOSIS — R262 Difficulty in walking, not elsewhere classified: Secondary | ICD-10-CM

## 2024-03-17 ENCOUNTER — Encounter: Payer: Self-pay | Admitting: Family Medicine

## 2024-03-19 ENCOUNTER — Ambulatory Visit: Admitting: Occupational Therapy

## 2024-03-19 ENCOUNTER — Ambulatory Visit: Admitting: Physical Therapy

## 2024-03-19 ENCOUNTER — Encounter: Payer: Self-pay | Admitting: Physical Therapy

## 2024-03-19 ENCOUNTER — Encounter: Payer: Self-pay | Admitting: Speech Pathology

## 2024-03-19 ENCOUNTER — Ambulatory Visit: Admitting: Speech Pathology

## 2024-03-19 DIAGNOSIS — R471 Dysarthria and anarthria: Secondary | ICD-10-CM

## 2024-03-19 DIAGNOSIS — R1312 Dysphagia, oropharyngeal phase: Secondary | ICD-10-CM

## 2024-03-19 DIAGNOSIS — R278 Other lack of coordination: Secondary | ICD-10-CM

## 2024-03-19 DIAGNOSIS — R2681 Unsteadiness on feet: Secondary | ICD-10-CM

## 2024-03-19 DIAGNOSIS — R262 Difficulty in walking, not elsewhere classified: Secondary | ICD-10-CM

## 2024-03-19 DIAGNOSIS — M6281 Muscle weakness (generalized): Secondary | ICD-10-CM

## 2024-03-19 DIAGNOSIS — R208 Other disturbances of skin sensation: Secondary | ICD-10-CM

## 2024-03-19 NOTE — Therapy (Signed)
 OUTPATIENT OCCUPATIONAL THERAPY NEURO Treatment  Patient Name: Kenneth [name] MRN: [id number] DOB:1960-12-27, 63 y.o., male Today's Date: 03/19/2024  PCP: Dr. Therese Flash REFERRING PROVIDER: Dr. Alessandra Ancona  END OF SESSION:  OT End of Session - 03/19/24 1513     Visit Number 3    Number of Visits 12    Date for OT Re-Evaluation 05/26/24    Authorization Type [employer] Medicaid    OT Start Time 1450    OT Stop Time 1528    OT Time Calculation (min) 38 min               Past Medical History:  Diagnosis Date   Alcohol dependence (HCC)    Chronic calcific pancreatitis (HCC)    CVA (cerebral vascular accident) (HCC) 01/24/2022   Diabetes mellitus without complication (HCC)    Hyperlipidemia    Hypertension    Melanoma (HCC) 2013   metastatic - resected and cured with chemo/immuno tx   Pancreatic cyst-mass?    Polyneuropathy    Past Surgical History:  Procedure Laterality Date   BIOPSY  02/03/2022   Procedure: BIOPSY;  Surgeon: [name], MD;  Location: [facility] ENDOSCOPY;  Service: Gastroenterology;;   BRAIN SURGERY  2015   to check for possible malignancy from melanoma, result were scar tissue   COLONOSCOPY WITH PROPOFOL N/A 02/03/2022   Procedure: COLONOSCOPY WITH PROPOFOL;  Surgeon: [name], MD;  Location: [facility] ENDOSCOPY;  Service: Gastroenterology;  Laterality: N/A;   ESOPHAGOGASTRODUODENOSCOPY (EGD) WITH PROPOFOL N/A 02/03/2022   Procedure: ESOPHAGOGASTRODUODENOSCOPY (EGD) WITH PROPOFOL;  Surgeon: [name], MD;  Location: [facility] ENDOSCOPY;  Service: Gastroenterology;  Laterality: N/A;   melanoma exicison     right leg   Patient Active Problem List   Diagnosis Date Noted   Onychomycosis 02/26/2024   CKD stage G2/A2, GFR 60-89 and albumin creatinine ratio 30-299 mg/g 02/13/2024   Stroke (cerebrum) (HCC) 02/08/2024   Acute focal neurological deficit 02/04/2024   Peripheral neuropathy 08/22/2023   Osteoarthritis of left knee 08/22/2023   BPV (benign positional  vertigo) 02/06/2023   Seborrhea 11/07/2022   History of melanoma 04/12/2022   Alcohol dependence in remission (HCC) 04/12/2022   Aortic atherosclerosis (HCC) 04/12/2022   Alcohol-induced chronic pancreatitis (HCC) 03/15/2022   History of CVA (cerebrovascular accident) 02/28/2022   Gastritis and gastroduodenitis    DNR (do not resuscitate) 02/01/2022   Vertigo 01/24/2022   Elevated PSA 11/24/2021   History of immunotherapy 11/25/2019   Melanoma metastatic to brain (HCC) 08/08/2016   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HCC) 08/13/2014   Metastatic melanoma to lung (HCC) 12/12/2012   Type 2 diabetes mellitus (HCC) 08/20/2007   Dyslipidemia 08/20/2007   Essential hypertension 08/19/2007    ONSET DATE: referral 02/14/24  REFERRING DIAG: CVA  THERAPY DIAG:  Muscle weakness (generalized)  Other lack of coordination  Unsteadiness on feet  Other disturbances of skin sensation  Rationale for Evaluation and Treatment: Rehabilitation  SUBJECTIVE:   SUBJECTIVE STATEMENT: Pt reports he had a nice birthday dinner Pt accompanied by: self  PERTINENT HISTORY: [name] is a 63 y.o. male who presented to [facility] ED with c/o slurred speech, facial droop, and R arm tingling. MRI on 02/04/2024 revealed Two adjacent acute infarcts within the left corona radiata, left lentiform nucleus and posterior limb of left internal capsule. PMHx includes alcohol dependence, chronic calcific pancreatitis, CVA, DM, HLD, HTN, melanoma, polyneuropathy  PRECAUTIONS: Fall  WEIGHT BEARING RESTRICTIONS: No  PAIN:  Are you having pain? No  FALLS: Has  patient fallen in last 6 months? No  LIVING ENVIRONMENT: Lives with: lives with their family Lives in: House/apartment Stairs: Yes: External: 4 steps;  Has following equipment at home: Single point cane, Walker - 2 wheeled, and shower chair  PLOF: Independent  PATIENT GOALS: get back to where I was before  OBJECTIVE:  Note: Objective measures were completed at  Evaluation unless otherwise noted.  HAND DOMINANCE: Right  ADLs: Overall ADLs: mod I with basic ADLS Transfers/ambulation related to ADLs: Eating: using LUE predominantly for eating, pt did htis before CVA, difficulty with cutting Grooming: currently using non dominant LUE UB Dressing: mod I LB Dressing: mod I Toileting: mod I Bathing: mod I Tub Shower transfers: mod I Equipment: Shower seat without back, grab bar   IADLs: Shopping: dependent Light housekeeping: dependent Meal Prep: has not attempted , previously microwave Community mobility: mod I with rollator Medication management: sister assists Financial management: sister assists Handwriting: 90% legible, with increased time  MOBILITY STATUS:  mod I with rollator     FUNCTIONAL OUTCOME MEASURES: Quick Dash: 52.5 % disability  UPPER EXTREMITY ROM:  LUE WFLS  Active ROM Right eval Left eval  Shoulder flexion 95   Shoulder abduction    Shoulder adduction    Shoulder extension    Shoulder internal rotation    Shoulder external rotation    Elbow flexion WFL   Elbow extension WFLs   Wrist flexion    Wrist extension    Wrist ulnar deviation    Wrist radial deviation    Wrist pronation WFLs   Wrist supination 90%   (Blank rows = not tested)  UPPER EXTREMITY MMT:     MMT Right eval Left eval  Shoulder flexion 3/5 4/5  Shoulder abduction    Shoulder adduction    Shoulder extension    Shoulder internal rotation    Shoulder external rotation    Middle trapezius    Lower trapezius    Elbow flexion 4/5 4+/5  Elbow extension 4/5 4+/5  Wrist flexion    Wrist extension    Wrist ulnar deviation    Wrist radial deviation    Wrist pronation    Wrist supination    (Blank rows = not tested)  HAND FUNCTION: Grip strength: Right: 26 lbs; Left: 30 lbs  COORDINATION: 9 Hole Peg test: Right: 69.13 sec; Left: 37.64 sec Box and Blocks:  Right 32blocks, Left 43blocks  SENSATION: Light touch: Impaired  RUE    COGNITION: Overall cognitive status: Within functional limits for tasks assessed    VISION ASSESSMENT: Not tested- denies changes      OBSERVATIONS: Pleasant gentleman motivated to improve                                                                                                                             TREATMENT DATE: 03/19/24-Reviewed cane exercises for shoulder flexion, chest press and abduction, min v.c  10-15 reps each then coordination HEP issued,  see pt instructions, min v.c Red putty exercises for sustained grip and pinch, min v.c- pt was issued putty at the hospital.  03/12/24- Flipping and dealing playing cards with RUE, rotating ball in hand for increased fine motor coordination, min difficulty/ v.c Placing metal pegs 3 sizes into pegboard with RUE then removing with in hand manipulation, min difficulty v.c  Cane exercises for shoulder flexion, abduction and chest press 15 reps each., min v.c  03/03/24- eval         PATIENT EDUCATION: Education details: cane exercises, coordiantion HEP, red putty Person educated: Patient Education method: Explanation, demonstration, handout Education comprehension: verbalized understanding, returned demonstraiton, v.c and demonstration   HOME EXERCISE PROGRAM: cane exercises , coordination exercises   GOALS: Goals reviewed with patient? Yes  SHORT TERM GOALS: Target date: 04/02/24  I with HEP Baseline:dependent Goal status:  ongoing  2.  Pt will demonstrate improved RUE functional use as evidenced by increasing box/ blocks score by 4 blocks. Baseline: RUE 32 blocks, LUE 43 blocks Goal status: ongoing  3.  Pt will demonstrate improved fine motor coordiantion for ADLs as evidenced by decreasing 9 hole peg test score to 64 secs or less Baseline: RUE 69.13, LUE 37.64 Goal status: ongoing  4.  Pt will increase RUE grip strength to 30 lbs or greater for increased functional use. Baseline: RUE 26 lbs, LUE  30 lbs Goal status: ongoing  5.  I with sensory precautions Baseline: dependent Goal status: discussed with pt ongoing  6.  Pt will use his RUE to brush his teeth at least 75% of the time Baseline: uses LUE Goal status: ongoing, pt is trying to use RUE  LONG TERM GOALS: Target date: 05/26/24  I with updated HEP Baseline: dependent Goal status:ongoing  2.  Pt will perform basic home mnagement modified independently Baseline: dependent Goal status: INITIAL  3.  Pt will demonstrate improved fine motor coordiantion for ADLs as evidenced by decreasing 9 hole peg test score to 56 secs or less Baseline: RUE 69.13, LUE 37.64 Goal status: INITIAL   4.  Pt will improve Quick Dash score to 45% or better disability Baseline: 52.5% Goal status: INITIAL  5.  Pt will report increased ease cutting food with RUE Baseline: pt reports difficulty cutting food with RUE Goal status: INITIAL  6.  Pt will write a sentence with 100% legibility using RUE. Baseline: 90% legibile Goal status: INITIAL  ASSESSMENT:  CLINICAL IMPRESSION: Pt is progressing towards goals. He demonstrates understanding of beginning HEP for cane and coordination.   PERFORMANCE DEFICITS: in functional skills including ADLs, IADLs, coordination, dexterity, sensation, ROM, strength, pain, flexibility, Fine motor control, Gross motor control, mobility, balance, endurance, decreased knowledge of precautions, decreased knowledge of use of DME, and UE functional use, , and psychosocial skills including coping strategies, environmental adaptation, habits, interpersonal interactions, and routines and behaviors.   IMPAIRMENTS: are limiting patient from ADLs, IADLs, rest and sleep, work, play, leisure, and social participation.   CO-MORBIDITIES: may have co-morbidities  that affects occupational performance. Patient will benefit from skilled OT to address above impairments and improve overall function.  MODIFICATION OR ASSISTANCE  TO COMPLETE EVALUATION: No modification of tasks or assist necessary to complete an evaluation.  OT OCCUPATIONAL PROFILE AND HISTORY: Detailed assessment: Review of records and additional review of physical, cognitive, psychosocial history related to current functional performance.  CLINICAL DECISION MAKING: LOW - limited treatment options, no task modification necessary  REHAB POTENTIAL: Good  EVALUATION COMPLEXITY: Low  PLAN:  OT FREQUENCY: 1x/week  OT DURATION: 12 weeks, anticipate wrapping up after 6-8 visits due to insurance limitations, and based upon progress.  PLANNED INTERVENTIONS: 97168 OT Re-evaluation, 97535 self care/ADL training, 16109 therapeutic exercise, 97530 therapeutic activity, 97112 neuromuscular re-education, 97140 manual therapy, 97035 ultrasound, 97018 paraffin, 60454 moist heat, 97010 cryotherapy, 97014 electrical stimulation unattended, passive range of motion, functional mobility training, energy conservation, coping strategies training, patient/family education, and DME and/or AE instructions  RECOMMENDED OTHER SERVICES: PT, ST  CONSULTED AND AGREED WITH PLAN OF CARE: Patient  PLAN FOR NEXT SESSION: continue to address functional use of RUE   Necola Bluestein, OT 03/19/2024, 3:15 PM

## 2024-03-19 NOTE — Therapy (Signed)
 OUTPATIENT PHYSICAL THERAPY LOWER EXTREMITY TREATMENT   Patient Name: Kenneth [name] MRN: [id number] DOB:06/27/1961, 63 y.o., male Today's Date: 03/19/2024  END OF SESSION:  PT End of Session - 03/19/24 1355     Visit Number 3    Number of Visits 13    Date for PT Re-Evaluation 05/27/24    Authorization Type [employer] MCD    Authorization Time Period 01/15/24 to 02/26/24    PT Start Time 1347    PT Stop Time 1427    PT Time Calculation (min) 40 min    Activity Tolerance Patient tolerated treatment well    Behavior During Therapy [facility] for tasks assessed/performed                Past Medical History:  Diagnosis Date   Alcohol dependence (HCC)    Chronic calcific pancreatitis (HCC)    CVA (cerebral vascular accident) (HCC) 01/24/2022   Diabetes mellitus without complication (HCC)    Hyperlipidemia    Hypertension    Melanoma (HCC) 2013   metastatic - resected and cured with chemo/immuno tx   Pancreatic cyst-mass?    Polyneuropathy    Past Surgical History:  Procedure Laterality Date   BIOPSY  02/03/2022   Procedure: BIOPSY;  Surgeon: [name], MD;  Location: [facility] ENDOSCOPY;  Service: Gastroenterology;;   BRAIN SURGERY  2015   to check for possible malignancy from melanoma, result were scar tissue   COLONOSCOPY WITH PROPOFOL N/A 02/03/2022   Procedure: COLONOSCOPY WITH PROPOFOL;  Surgeon: [name], MD;  Location: [facility] ENDOSCOPY;  Service: Gastroenterology;  Laterality: N/A;   ESOPHAGOGASTRODUODENOSCOPY (EGD) WITH PROPOFOL N/A 02/03/2022   Procedure: ESOPHAGOGASTRODUODENOSCOPY (EGD) WITH PROPOFOL;  Surgeon: [name], MD;  Location: [facility] ENDOSCOPY;  Service: Gastroenterology;  Laterality: N/A;   melanoma exicison     right leg   Patient Active Problem List   Diagnosis Date Noted   Onychomycosis 02/26/2024   CKD stage G2/A2, GFR 60-89 and albumin creatinine ratio 30-299 mg/g 02/13/2024   Stroke (cerebrum) (HCC) 02/08/2024   Acute focal neurological  deficit 02/04/2024   Peripheral neuropathy 08/22/2023   Osteoarthritis of left knee 08/22/2023   BPV (benign positional vertigo) 02/06/2023   Seborrhea 11/07/2022   History of melanoma 04/12/2022   Alcohol dependence in remission (HCC) 04/12/2022   Aortic atherosclerosis (HCC) 04/12/2022   Alcohol-induced chronic pancreatitis (HCC) 03/15/2022   History of CVA (cerebrovascular accident) 02/28/2022   Gastritis and gastroduodenitis    DNR (do not resuscitate) 02/01/2022   Vertigo 01/24/2022   Elevated PSA 11/24/2021   History of immunotherapy 11/25/2019   Melanoma metastatic to brain (HCC) 08/08/2016   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HCC) 08/13/2014   Metastatic melanoma to lung (HCC) 12/12/2012   Type 2 diabetes mellitus (HCC) 08/20/2007   Dyslipidemia 08/20/2007   Essential hypertension 08/19/2007    PCP: Graig Lawyer, MD  REFERRING PROVIDER: Alessandra Ancona, MD  REFERRING DIAG: Diagnosis R29.898 (ICD-10-CM) - Right leg weakness Z86.73 (ICD-10-CM) - History of CVA (cerebrovascular accident)  THERAPY DIAG:  Difficulty in walking, not elsewhere classified  Muscle weakness (generalized)  Other lack of coordination  Unsteadiness on feet  Rationale for Evaluation and Treatment: Rehabilitation  ONSET DATE: 02/04/24  SUBJECTIVE:   SUBJECTIVE STATEMENT:  Doing OK, had that other stroke and balance is a lot worse since. Can't close my right eye and right thumb and first finger are numb, having a harder time speaking.    Patient was being seen here for leg pain and weakness  in February.  He was admitted to the hospital on 02/04/24, was discharged on 02/16/24.   [name] is a 63 y.o. male who presented to [facility] ED with c/o slurred speech, facial droop, and R arm tingling. MRI on 02/04/2024 revealed Two adjacent acute infarcts within the left corona radiata, left lentiform nucleus and posterior limb of left internal capsule. PMHx includes alcohol dependence, chronic calcific pancreatitis, CVA, DM,  HLD, HTN, melanoma, polyneuropathy   PERTINENT HISTORY: See above  PAIN:  Are you having pain? No 0/10 now   PRECAUTIONS: Fall  RED FLAGS: None   WEIGHT BEARING RESTRICTIONS: No  FALLS:  Has patient fallen in last 6 months? Yes, 1 LIVING ENVIRONMENT: Lives with: lives with their family Lives in: House/apartment- townhouse  Stairs: No Has following equipment at home: Environmental consultant - 2 wheeled and Wheelchair (manual)  OCCUPATION: not working   PLOF: Independent with gait, Independent with transfers, and Requires assistive device for independence  PATIENT GOALS: to be able to get around on my own completely, be more independent   NEXT MD VISIT: [place] tomorrow, neurologist April 28th  OBJECTIVE:  Note: Objective measures were completed at Evaluation unless otherwise noted.  DIAGNOSTIC FINDINGS:   02/04/24 IMPRESSION: 1. Two adjacent acute infarcts within the left corona radiata, left lentiform nucleus and posterior limb of left internal capsule (individually measuring up to 2 cm). 2. Moderate-sized focus of chronic cortical encephalomalacia/gliosis within the anterolateral left frontal lobe (deep to a cranioplasty). 3. Background parenchymal atrophy, chronic small vessel ischemic disease and chronic lacunar infarcts, as described. 4. Several chronic microhemorrhages in a distribution suggesting sequelae of hypertensive microangiopathy. 5. Mild inflammatory left maxillary sinus disease. COGNITION: Overall cognitive status: Within functional limits for tasks assessed, slurred speech     SENSATION: Not tested hx of neuropathy   COORDINATION  Impaired coordination R LE, facial droop right, slurred speech and difficulty blinking right eye   LOWER EXTREMITY MMT:  MMT Right eval Left eval  Hip flexion 3+ 4+  Hip extension    Hip abduction 4- 5  Hip adduction    Hip internal rotation    Hip external rotation    Knee flexion 4- 5  Knee extension 4- 5  Ankle  dorsiflexion 4- 4-  Ankle plantarflexion    Ankle inversion    Ankle eversion     (Blank rows = not tested)   FUNCTIONAL TESTS:  TUG: 26 seconds with 4WW 5 times sit to stand: 25 seconds use of BUEs on chair, posterior unsteadiness/poor eccentric control  Berg: 24/56 3 minute walk test: 350 feet with 4WW, c/o fatigue and the right foot started to drag  GAIT: Distance walked: 359ft Assistive device utilized: 4WW Level of assistance: SBA Comments:  limited WB RLE, limited ankle DF and knee ROM during gait cycle , mild toe drag when fatigued  TREATMENT DATE:   03/19/24  Nustep L5x8 minutes seat 7   Standing regular BOS blue foam pad 3x30 seconds close S  Standing on foam with cross midline reaches to random numbers  Standing on blue foam pad with toe taps to targets (random call outs) U UE support min guard, random combinations   Standing marches 3# 2x10 B Standing HS curls 3# 2x10 B Standing hip ABD 3# 2x10 B Squats in // bars 2x10        03/14/24 NuStep L5x75mins  STS 2x10 LAQ 2# 2x10 Standing marches 2# 20 reps alt x2 HS curls green 2x10 Box taps 4" by stairs 1HRA, then x10 alt without  TUG- 23s   PATIENT EDUCATION:  Education details: exam, POC, HEP  Person educated: Patient Education method: Programmer, multimedia, Demonstration, and Handouts Education comprehension: verbalized understanding, returned demonstration, and needs further education  HOME EXERCISE PROGRAM: Access Code: [email or web address] URL: [email or web address] Date: 02/25/2024 Prepared by: Cherylene Corrente  Exercises - Seated Long Arc Quad  - 2 x daily - 7 x weekly - 2 sets - 10 reps - 3 hold - Seated March  - 2 x daily - 7 x weekly - 2 sets - 10 reps - 3 hold - Seated Heel Toe Raises  - 2 x daily - 7 x weekly - 2 sets - 10 reps - 3 hold  ASSESSMENT:  CLINICAL  IMPRESSION:    Pt arrives today doing OK, still noticing a lot of new impairments since the most recent CVA. Continued working on a mix of balance and strength today, also worked on reciprocal motion and activity tolerance via Nustep. Needed intermittent cues for safety with rollator given increased difficulty with balance after the new CVA.    Patient is a 63 y.o. M who was seen today for physical therapy evaluation and treatment for s/p CVA, difficulty walking and weakness.   RLE is weaker than the L.  Of more concern is general balance and coordination- didn't score well on Berg and is definitely a high fall risk. TUG and 5XSTS put him at risk for falls as well, with standing balance he tended to be back on his heels and did lose balance twice on the Berg requiring mod A to correct.  Will benefit from skilled PT services to address all findings and assist in return to optimal level of function.   OBJECTIVE IMPAIRMENTS: Abnormal gait, decreased activity tolerance, decreased balance, decreased knowledge of use of DME, decreased mobility, difficulty walking, and decreased strength.   ACTIVITY LIMITATIONS: sitting, standing, squatting, stairs, transfers, and locomotion level  PARTICIPATION LIMITATIONS: driving, shopping, community activity, occupation, and yard work  PERSONAL FACTORS: Age, Fitness, Past/current experiences, Social background, and Time since onset of injury/illness/exacerbation are also affecting patient's functional outcome.   REHAB POTENTIAL: Good  CLINICAL DECISION MAKING: Stable/uncomplicated  EVALUATION COMPLEXITY: Low   GOALS: Goals reviewed with patient? No  SHORT TERM GOALS: Target date: 03/24/24   Will be compliant with appropriate progressive HEP  Baseline: Goal status: progressing 03/14/24  2.  Will score at least 35 on Berg to show improved balance  Baseline:  Goal status: INITIAL  3.  Will be able to name 3 ways to prevent fall at home and in the  community  Baseline:  Goal status: no carpets, no loose rugs, has a lights in room so can see at night MET 03/14/24    LONG TERM GOALS: Target date: 05/27/24    MMT to have improved by one grade in all  weak groups Baseline:  Goal status: INITIAL  2.  Will score at least 42 on Berg to show reduced fall risk  Baseline:  Goal status: INITIAL  3.  Will be able to perform all daily dressing/self care activities on an independent basis without LOB  Baseline:  Goal status: INITIAL  4.  Will be able to ambulate at least 554ft in with LRAD to show improved community access, fatigue no more than 3/10 Baseline:  Goal status: INITIAL  5.  Decrease TUG time to 15 seconds Baseline:  Goal status: 23s 03/14/24     PLAN:  PT FREQUENCY: 1-2x/week  PT DURATION: 12 weeks  PLANNED INTERVENTIONS: 97164- PT Re-evaluation, 97110-Therapeutic exercises, 97530- Therapeutic activity, 97112- Neuromuscular re-education, 97535- Self Care, 16109- Manual therapy, [phone]- Gait training, [phone]- Aquatic Therapy, Patient/Family education, Balance training, Stair training, and Moist heat  PLAN FOR NEXT SESSION: heavy focus on balance, strengthening as able, general conditioning. Safety training/education as needed   Terrel Ferries, PT, DPT 03/19/24 2:28 PM   For all possible CPT codes, reference the Planned Interventions line above.     Check all conditions that are expected to impact treatment: {Conditions expected to impact treatment:Morbid obesity, Diabetes mellitus, Neurological condition and/or seizures, and Social determinants of health   If treatment provided at initial evaluation, no treatment charged due to lack of authorization.

## 2024-03-19 NOTE — Therapy (Signed)
 OUTPATIENT SPEECH LANGUAGE PATHOLOGY TREATMENT  Patient Name: Kenneth [name] MRN: [id number] DOB:[birth year], 63 y.o., male Today's Date: 03/19/2024  PCP: Graig Lawyer, MD REFERRING PROVIDER: Graig Lawyer, MD  END OF SESSION:  End of Session - 03/19/24 1234     Visit Number 4    Number of Visits 8    Date for SLP Re-Evaluation 04/27/24    SLP Start Time 1230    SLP Stop Time  1310    SLP Time Calculation (min) 40 min    Activity Tolerance Patient tolerated treatment well             Past Medical History:  Diagnosis Date   Alcohol dependence (HCC)    Chronic calcific pancreatitis (HCC)    CVA (cerebral vascular accident) (HCC) 01/24/2022   Diabetes mellitus without complication (HCC)    Hyperlipidemia    Hypertension    Melanoma (HCC) 2013   metastatic - resected and cured with chemo/immuno tx   Pancreatic cyst-mass?    Polyneuropathy    Past Surgical History:  Procedure Laterality Date   BIOPSY  02/03/2022   Procedure: BIOPSY;  Surgeon: [name], MD;  Location: [facility] ENDOSCOPY;  Service: Gastroenterology;;   BRAIN SURGERY  2015   to check for possible malignancy from melanoma, result were scar tissue   COLONOSCOPY WITH PROPOFOL N/A 02/03/2022   Procedure: COLONOSCOPY WITH PROPOFOL;  Surgeon: [name], MD;  Location: [facility] ENDOSCOPY;  Service: Gastroenterology;  Laterality: N/A;   ESOPHAGOGASTRODUODENOSCOPY (EGD) WITH PROPOFOL N/A 02/03/2022   Procedure: ESOPHAGOGASTRODUODENOSCOPY (EGD) WITH PROPOFOL;  Surgeon: [name], MD;  Location: [facility] ENDOSCOPY;  Service: Gastroenterology;  Laterality: N/A;   melanoma exicison     right leg   Patient Active Problem List   Diagnosis Date Noted   Onychomycosis 02/26/2024   CKD stage G2/A2, GFR 60-89 and albumin creatinine ratio 30-299 mg/g 02/13/2024   Stroke (cerebrum) (HCC) 02/08/2024   Acute focal neurological deficit 02/04/2024   Peripheral neuropathy 08/22/2023   Osteoarthritis of left knee  08/22/2023   BPV (benign positional vertigo) 02/06/2023   Seborrhea 11/07/2022   History of melanoma 04/12/2022   Alcohol dependence in remission (HCC) 04/12/2022   Aortic atherosclerosis (HCC) 04/12/2022   Alcohol-induced chronic pancreatitis (HCC) 03/15/2022   History of CVA (cerebrovascular accident) 02/28/2022   Gastritis and gastroduodenitis    DNR (do not resuscitate) 02/01/2022   Vertigo 01/24/2022   Elevated PSA 11/24/2021   History of immunotherapy 11/25/2019   Melanoma metastatic to brain (HCC) 08/08/2016   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HCC) 08/13/2014   Metastatic melanoma to lung (HCC) 12/12/2012   Type 2 diabetes mellitus (HCC) 08/20/2007   Dyslipidemia 08/20/2007   Essential hypertension 08/19/2007    ONSET DATE: 02/08/24   REFERRING DIAG:  Free Text Diagnosis  CVA    THERAPY DIAG:  Oropharyngeal dysphagia  Dysarthria and anarthria  Rationale for Evaluation and Treatment: Rehabilitation  SUBJECTIVE:   SUBJECTIVE STATEMENT: Pt brought in food for today's session.   Pt accompanied by: self, sisters are bringing to therapy  PERTINENT HISTORY: Per chart review: 63 y.o. right-handed male with history of HTN, CVA, recent fall due to RLE weakness, EtOH abuse in the past, T2DM, polyneuropathy, melanoma with metastasis to the brain and lung (in remission)   PAIN:  Are you having pain? No  FALLS: Has patient fallen in last 6 months?  No  LIVING ENVIRONMENT: Lives with:  sister Aisha Hove) Lives in: House/apartment  PLOF:  Level of assistance: Independent  with ADLs, Independent with IADLs Employment: Other: working on disability. Out of work since CVA in 2023. 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   PATIENT GOALS: speech, swallowing  OBJECTIVE:  Note: Objective measures were completed at Evaluation unless otherwise noted.  DIAGNOSTIC FINDINGS: See EMR for full report.     COGNITION: Overall cognitive status: Within functional limits for  tasks assessed; pt reports no changes. Assessment in CIR deemed cognition WFL.  Areas of impairment:  NA Functional deficits: NA   MOTOR SPEECH: Overall motor speech: impaired Level of impairment: Phrase, Sentence, and Conversation Respiration:  WFL Phonation: normal Resonance: WFL Articulation: Impaired: phrase, sentence, and conversation Intelligibility: Intelligibility reduced Motor planning: Appears intact Motor speech errors:  NA Interfering components:  NA Effective technique: slow rate, increased vocal intensity, and over articulate  ORAL MOTOR EXAMINATION: Overall status: Impaired:   Labial: Right (Symmetry and Strength) Lingual: Right (Symmetry and Strength) Facial: Right (Symmetry and Strength) Velum: ROM Cough: WFL Comments:   RECOMMENDATIONS FROM OBJECTIVE SWALLOW STUDY (MBSS/FEES):    Modified Barium Swallow 02/12/24  Recommendations/Plan: Swallowing Evaluation Recommendations Swallowing Evaluation Recommendations Recommendations: PO diet PO Diet Recommendation: Regular; Mildly thick liquids (Level 2, nectar thick) Liquid Administration via: Spoon; Cup; No straw Medication Administration: Whole meds with puree Supervision: Patient able to self-feed; Intermittent supervision/cueing for swallowing strategies Swallowing strategies  : Minimize environmental distractions; Slow rate; Small bites/sips Postural changes: Position pt fully upright for meals Oral care recommendations: Oral care BID (2x/day) Caregiver Recommendations: Avoid jello, ice cream, thin soups, popsicles; Remove water pitcher  Modified Barium Swallow 02/05/24  Recommendations/Plan: Swallowing Evaluation Recommendations Swallowing Evaluation Recommendations Recommendations: PO diet PO Diet Recommendation: Dysphagia 3 (Mechanical soft); Moderately thick liquids (Level 3, honey thick) Liquid Administration via: Spoon; Cup Medication Administration: Whole meds with puree Supervision: Patient  able to self-feed; Intermittent supervision/cueing for swallowing strategies Swallowing strategies  : Minimize environmental distractions; Slow rate; Small bites/sips Postural changes: Position pt fully upright for meals Oral care recommendations: Oral care BID (2x/day) Caregiver Recommendations: Avoid jello, ice cream, thin soups, popsicles; Remove water pitcher     CLINICAL SWALLOW ASSESSMENT:   Current diet: regular and nectar thick liquids Dentition: adequate natural dentition Patient directly observed with POs: No Feeding: able to feed self Liquids provided by:  Not tested; no straws Oral phase signs and symptoms:  Not tested Pharyngeal phase signs and symptoms:  Not tested Comments: To be assessed next session.    PATIENT REPORTED OUTCOME MEASURES (PROM): EAT-10: 14                                                                                                                            TREATMENT DATE:   03/19/24: Pt was seen for skilled ST services targeting dysphagia and dysarthria. Pt reported he went to steakhouse for his birthday and everything "went down good". He reports he brought his nectar thick liquid packet with him to restaurant and requested no ice.  He has been completing exercises at home x1/day. Pt reports he did not practice his functional phrases, but he reports he tried to "be clear" when speaking to his sister. He reports he has not had repeat himself very often - "it is tougher (to speak) in the evening". Pt and SLP reviewed functional strategies from last session. Pt read functional phrases with no speech errors. Pt does not have any questions about speech intelligibility strategies and strategies learned in CIR ("SLOP").   Pt completed oral care and SLP facilitated FFWP while completing effortful swallow exercise. SLP encouraged pt to complete oral care at least 3x a day - after meals and ALWAYS before water. Pt was unable to complete with max cueing. To  continue to trial. SLP introduced supraglottic swallow. Pt was able to complete with min cueing. To continue to practice use while drinking water at home.     Pt reports biting R cheek while eating - SLP suggested trying to masticate on opposite side, as well as taking smaller bites.   03/12/24: Pt was seen for skilled ST services targeting dysphagia and dysarthria. Pt forgot toothbrush, so unable to do liquid trials (FFWP) this session. Pt reports he has been completing all swallowing exercises once a day. Pt reports speech has been "alright", but that he has to repeat himself throughout day ~5x. SLP educated on "Be Clear" strategies. SLP assisted pt in developing functional phrases to practice throughout the day. SLP educated on when he would be most likely to use strategies re: drive thru, fatigued, phone. Pt to continue practice at home.    03/03/24: Pt was seen for skilled ST services targeting dysphagia. Of note, pt reports his speech is 80% back to baseline at this time - more slurred when tired. When prompted, pt reports he has only completed exercises twice since last being seen - SLP encouraged to complete everyday. SLP suggested creating a routine to ensure exercises completion.   Completed the following exercises: CTAR: x10  - 5 second hold  Masako x10  - required extra time to complete; difficult to initiate. Trialed tongue at different places.   Effortful Swallow  -Visibly, SLP did not see any increased effort - though, pt reported it felt different. To continue practice.   Pt completed oral care prior to water trials today. SLP observed immediate and delayed coughs after every sip of water. SLP reviewed Consuella Denis Protocol and the importance of completing oral care prior to water trials. SLP observed pt with regular and NTL trial. Pt was able to masticate effectively. No residue observed after liquid wash. No solids were left in buccal cavity. Pt performed a finger sweep to check  for pocketing due to decreased sensation. Safe to continue regular and NTL diet.      PATIENT EDUCATION: Education details: SLP role  Person educated: Patient Education method: Chief Technology Officer Education comprehension: needs further education   GOALS: Goals reviewed with patient? Yes  SHORT TERM GOALS: Target date: 03/28/24  Pt will demonstrate use of pharyngeal strengthening exercises independently. Baseline: Goal status: INITIAL  2.  Pt will recall safe swallow strategies independently Baseline:  Goal status: INITIAL  3.  Pt will recall speech intelligibility compensations with minA verbal cues Baseline:  Goal status: INITIAL    LONG TERM GOALS: Target date: 04/27/24  Pt will improve score on PROMs Baseline: EAT-10: 14 Goal status: INITIAL  2.  Pt will utilize speech intelligibility compensations in conversation independently. Baseline:  Goal status: INITIAL  3.  Pt will recall 3 environmental modifications for dysarthria Baseline:  Goal status: INITIAL    ASSESSMENT:  CLINICAL IMPRESSION: Pt is a 63 yo male who presents to ST OP for evaluation post CVA. Pt received therapies in inpatient rehab and discharged last week. Pt endorses difficulties with speech and swallowing. Pt was observed to be ~90% intelligible in conversations in a quiet environment. Pt speech characterized by re: imprecise articulation. Pt reports feeling like his "tongue is too big for his mouth".  Pt to bring food next session for obeservation. Reports no difficulty with solids; however, was discharged on NTL (mildly thick) liquids and [employer] Protocol (FFWP). Pt was able to recall steps to water protocol for safe consumption. SLP provided pt with handout on safe swallow strategies and pharyngeal exercises he was completing in CIR. SLP rec skilled ST services to address dysarthria and dysphagia to maximize functional communication and increased safety with swallowing  respectively.     OBJECTIVE IMPAIRMENTS: include dysarthria and dysphagia. These impairments are limiting patient from effectively communicating at home and in community and safety when swallowing. Factors affecting potential to achieve goals and functional outcome are  NA . Patient will benefit from skilled SLP services to address above impairments and improve overall function.  REHAB POTENTIAL: Good; limited by insurance  PLAN:  SLP FREQUENCY: 1-2x/week  SLP DURATION: 8 weeks  PLANNED INTERVENTIONS: Aspiration precaution training, Pharyngeal strengthening exercises, Diet toleration management , Environmental controls, Trials of upgraded texture/liquids, Cueing hierachy, Functional tasks, SLP instruction and feedback, Compensatory strategies, Patient/family education, [phone] Treatment of speech (30 or 45 min) , and [id number] Treatment of swallowing function    [employer], CCC-SLP 03/19/2024, 12:35 PM

## 2024-03-19 NOTE — Patient Instructions (Signed)
  Coordination Activities  Perform the following activities for 10 minutes 1 times per day with right hand(s).  Rotate ball in fingertips (clockwise and counter-clockwise). Flip cards 1 at a time as fast as you can. Deal cards with your thumb (Hold deck in hand and push card off top with thumb). Pick up coins, buttons, marbles, dried beans/pasta of different sizes and place in container. Pick up coins and place in container or coin bank. Pick up coins and stack. Pick up coins one at a time until you get 5-10 in your hand, then move coins from palm to fingertips to  place in a container Practice writing and/or typing.

## 2024-03-25 NOTE — Progress Notes (Unsigned)
 PATIENT: Kenneth [name] DOB: Dec 23, 1960  REASON FOR VISIT: follow up HISTORY FROM: patient PRIMARY NEUROLOGIST: Dr. Janett Medin  Chief Complaint  Patient presents with   Hospitalization Follow-up    Rm 20, Nina sister.  [facility] therapy (PT/OT/ST).  Lives with sister.  Using walker.  Using thick it for liquids.       HISTORY OF PRESENT ILLNESS: Today 03/25/24  Kenneth [name] is a 63 y.o. male here for hospital follow-up after left BG and CR infarct likely secondary to large vessel disease source. returns today for follow-up.  Patient reports that he developed trouble speaking and went to the ED.  Since being back at home he does feel that he is slowly getting better.  Continues to have some coordination issues on the right side.  Still notices weakness in the right upper extremity.  States that the right eye still does not blink as fast as the left eye.  Currently on thickened liquids.  Remains in physical therapy, Occupational Therapy and speech therapy.  Denies any trouble with his memory.  Reports that PCP is managing his diabetes.  Reports Januvia  was recently increased to 100 mg daily.  He is monitoring his diet.  He remains on Lipitor for his cholesterol.  He returns today for an evaluation.   02/04/24 MRI brain:    IMPRESSION: 1. Two adjacent acute infarcts within the left corona radiata, left lentiform nucleus and posterior limb of left internal capsule (individually measuring up to 2 cm). 2. Moderate-sized focus of chronic cortical encephalomalacia/gliosis within the anterolateral left frontal lobe (deep to a cranioplasty). 3. Background parenchymal atrophy, chronic small vessel ischemic disease and chronic lacunar infarcts, as described. 4. Several chronic microhemorrhages in a distribution suggesting sequelae of hypertensive microangiopathy. 5. Mild inflammatory left maxillary sinus disease.   CTA head/neck 02/04/24: CTA neck:   1. The common carotid and internal  carotid arteries are patent within the neck without stenosis. Mild atherosclerotic plaque bilaterally, as described. 2. The right vertebral artery is non-dominant and developmentally diminutive, but patent throughout the neck. 3. The dominant left vertebral artery is patent within the neck without stenosis. 4. Aortic Atherosclerosis (ICD10-I70.0). 5. Aberrant right subclavian artery (anatomic variant).   CTA head:   1. No proximal intracranial large vessel occlusion is identified. 2. Intracranial atherosclerotic disease with multifocal stenoses, most notably as follows. 3. Severe stenosis at the origin of an M2 left middle cerebral artery vessel. 4. Severe stenosis within the right posterior cerebral artery P3 Segment  02/05/24: ECHO Conclusion(s)/Recommendation(s): No intracardiac source of embolism  detected on this transthoracic study. Consider a transesophageal  echocardiogram to exclude cardiac source of embolism if clinically  indicated.   HISTORY Kenneth [name] is a 63 y.o. male with history of hypertension, hyperlipidemia, diabetes, neuropathy, previous stroke, melanoma admitted for slurred speech, right facial droop and right sided weakness. No TNK given due to outside window.     Stroke:  left BG and CR infarct likely secondary to large vessel disease source CT no acute abnormality.  Chronic encephalomalacia within the anterior lateral left frontal lobe underlying a cranioplasty  MRI 2 adjacent acute infarct within the left CR, left BG and PLIC CT head and neck left M2 severe stenosis, right P3 severe stenosis 2D Echo EF 60 to 65% LDL 41 HgbA1c 8.8 Lovenox  for VTE prophylaxis aspirin  81 mg daily prior to admission, now on aspirin  81 mg daily and clopidogrel  75 mg daily DAPT for 3 months and  then Plavix  alone given severe left M2 stenosis. Patient counseled to be compliant with his antithrombotic medications Ongoing aggressive stroke risk factor management Therapy  recommendations: CIR Disposition: Pending   History of stroke 01/2022 admitted for acute right thalamic and subacute left pontine infarct.  CTA head and neck unremarkable.  MRI with contrast no metastasis.  EF 60 to 65%.  LDL 47.  30-day CardioNet monitoring no A-fib.  Put on DAPT for 3 weeks and then aspirin  alone.    REVIEW OF SYSTEMS: Out of a complete 14 system review of symptoms, the patient complains only of the following symptoms, and all other reviewed systems are negative.  ALLERGIES: Allergies  Allergen Reactions   Cat Dander Shortness Of Breath and Swelling    Swelling, watery eyes    HOME MEDICATIONS: Outpatient Medications Prior to Visit  Medication Sig Dispense Refill   amLODipine -benazepril  (LOTREL) 10-20 MG capsule Take 1 capsule by mouth daily. 90 capsule 3   aspirin  EC 81 MG tablet Take 1 tablet (81 mg total) by mouth daily. Swallow whole. 100 tablet 0   atorvastatin  (LIPITOR) 20 MG tablet Take 1 tablet (20 mg total) by mouth daily. 30 tablet 0   B Complex-C (B-COMPLEX WITH VITAMIN C) tablet Take 1 tablet by mouth daily. 30 tablet 0   Blood Glucose Monitoring Suppl DEVI 1 each by Does not apply route in the morning, at noon, and at bedtime. May substitute to any manufacturer covered by patient's insurance. 1 each 0   clopidogrel  (PLAVIX ) 75 MG tablet Take 1 tablet (75 mg total) by mouth daily. 30 tablet 1   diclofenac  Sodium (VOLTAREN ) 1 % GEL Apply 2 g topically 4 (four) times daily. 100 g 0   Glucose Blood (BLOOD GLUCOSE TEST STRIPS) STRP Use to test blood sugar three times daily as directed. 100 strip 0   ketoconazole  (NIZORAL ) 2 % cream Apply topically 2 (two) times daily. 15 g 0   lactobacillus acidophilus (BACID) TABS tablet Take 1 tablet by mouth daily. Chiropractor Device MISC 1 each by Does not apply route in the morning, at noon, and at bedtime. May substitute to any manufacturer covered by patient's insurance. 1 each 0   magnesium  oxide (MAG-OX) 400 MG  tablet Take 0.5 tablets (200 mg total) by mouth at bedtime. 30 tablet 0   mirtazapine  (REMERON ) 7.5 MG tablet TAKE 1 TABLET BY MOUTH DAILY AT BEDTIME 90 tablet 3   Multiple Vitamin (MULTIVITAMIN WITH MINERALS) TABS tablet Take 1 tablet by mouth daily.     pantoprazole  (PROTONIX ) 20 MG tablet Take 1 tablet (20 mg total) by mouth daily. 30 tablet 0   sitaGLIPtin  (JANUVIA ) 100 MG tablet Take 1 tablet (100 mg total) by mouth daily. (Patient taking differently: Take 100 mg by mouth every evening.) 90 tablet 3   tamsulosin  (FLOMAX ) 0.4 MG CAPS capsule Take 1 capsule (0.4 mg total) by mouth daily after supper.     triamcinolone  cream (KENALOG ) 0.5 % Apply topically 2 (two) times daily as needed (facial rash). 30 g 0   vitamin D3 (CHOLECALCIFEROL ) 25 MCG tablet Take 3 tablets (3,000 Units total) by mouth daily. 90 tablet 0   No facility-administered medications prior to visit.    PAST MEDICAL HISTORY: Past Medical History:  Diagnosis Date   Alcohol dependence (HCC)    Chronic calcific pancreatitis (HCC)    CVA (cerebral vascular accident) (HCC) 01/24/2022   Diabetes mellitus without complication (HCC)    Hyperlipidemia  Hypertension    Melanoma (HCC) 2013   metastatic - resected and cured with chemo/immuno tx   Pancreatic cyst-mass?    Polyneuropathy     PAST SURGICAL HISTORY: Past Surgical History:  Procedure Laterality Date   BIOPSY  02/03/2022   Procedure: BIOPSY;  Surgeon: [name], MD;  Location: [facility] ENDOSCOPY;  Service: Gastroenterology;;   BRAIN SURGERY  2015   to check for possible malignancy from melanoma, result were scar tissue   COLONOSCOPY WITH PROPOFOL  N/A 02/03/2022   Procedure: COLONOSCOPY WITH PROPOFOL ;  Surgeon: [name], MD;  Location: [facility] ENDOSCOPY;  Service: Gastroenterology;  Laterality: N/A;   ESOPHAGOGASTRODUODENOSCOPY (EGD) WITH PROPOFOL  N/A 02/03/2022   Procedure: ESOPHAGOGASTRODUODENOSCOPY (EGD) WITH PROPOFOL ;  Surgeon: [name], MD;   Location: [facility] ENDOSCOPY;  Service: Gastroenterology;  Laterality: N/A;   melanoma exicison     right leg    FAMILY HISTORY: Family History  Problem Relation Age of Onset   Heart disease Father    Prostate cancer Father    Cancer Maternal Grandmother        Brain   Stomach cancer Paternal Grandmother    Colon cancer Neg Hx    Esophageal cancer Neg Hx    Rectal cancer Neg Hx     SOCIAL HISTORY: Social History   Socioeconomic History   Marital status: Single    Spouse name: Not on file   Number of children: 0   Years of education: Not on file   Highest education level: Bachelor's degree (e.g., BA, AB, BS)  Occupational History   Occupation: Receptionist    Comment: [employer]  Tobacco Use   Smoking status: Never   Smokeless tobacco: Former    Types: Snuff  Vaping Use   Vaping status: Never Used  Substance and Sexual Activity   Alcohol use: Not Currently    Comment: Historyof alcohol abuse (12-16 beers/day)   Drug use: No   Sexual activity: Yes  Other Topics Concern   Not on file  Social History Narrative   Single   Has supportive sisters   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   Heavy alcohol use/alcoholism currently in remission no other drug use former smokeless tobacco no tobacco now   Social Drivers of Corporate investment banker Strain: Low Risk  (01/06/2024)   Overall Financial Resource Strain (CARDIA)    Difficulty of Paying Living Expenses: Not very hard  Food Insecurity: No Food Insecurity (02/04/2024)   Hunger Vital Sign    Worried About Running Out of Food in the Last Year: Never true    Ran Out of Food in the Last Year: Never true  Transportation Needs: No Transportation Needs (02/04/2024)   PRAPARE - Administrator, Civil Service (Medical): No    Lack of Transportation (Non-Medical): No  Physical Activity: Unknown (01/06/2024)   Exercise Vital Sign    Days of Exercise per Week: 0 days     Minutes of Exercise per Session: Not on file  Stress: Not on file  Social Connections: Moderately Integrated (02/04/2024)   Social Connection and Isolation Panel [NHANES]    Frequency of Communication with Friends and Family: Twice a week    Frequency of Social Gatherings with Friends and Family: Once a week    Attends Religious Services: More than 4 times per year    Active Member of [employer] or Organizations: Yes    Attends Banker Meetings: More than  4 times per year    Marital Status: Never married  Intimate Partner Violence: Not At Risk (02/04/2024)   Humiliation, Afraid, Rape, and Kick questionnaire    Fear of Current or Ex-Partner: No    Emotionally Abused: No    Physically Abused: No    Sexually Abused: No      PHYSICAL EXAM  Vitals:   03/26/24 0933  BP: 139/72  Pulse: 65  Weight: 154 lb (69.9 kg)  Height: 5\' 5"  (1.651 m)   Body mass index is 25.63 kg/m.  Generalized: Well developed, in no acute distress   Neurological examination  Mentation: Alert oriented to time, place, history taking. Follows all commands speech and language fluent Cranial nerve II-XII:  extraocular movements were full, visual field were full on confrontational test. Facial sensation and strength were normal. Uvula tongue midline. Head turning and shoulder shrug  were normal and symmetric. Motor: The motor testing reveals 5 over 5 strength in all extremities with exception of slightly weaker in the right upper extremity.  Good symmetric motor tone is noted throughout.  Sensory: Sensory testing is intact to soft touch on all 4 extremities. No evidence of extinction is noted.  Coordination: Cerebellar testing reveals good finger-nose-finger and heel-to-shin bilaterally.  Gait and station: Uses a rollator when ambulating.  Reflexes: Deep tendon reflexes are symmetric but depressed in the lower extremities  DIAGNOSTIC DATA (LABS, IMAGING, TESTING) - I reviewed patient records, labs, notes,  testing and imaging myself where available.  Lab Results  Component Value Date   WBC 5.3 02/14/2024   HGB 13.2 02/14/2024   HCT 38.7 (L) 02/14/2024   MCV 85.6 02/14/2024   PLT 294 02/14/2024      Component Value Date/Time   NA 146 (H) 02/26/2024 0908   K 3.9 02/26/2024 0908   CL 108 02/26/2024 0908   CO2 28 02/26/2024 0908   GLUCOSE 129 (H) 02/26/2024 0908   BUN 12 02/26/2024 0908   CREATININE 1.00 02/26/2024 0908   CREATININE 0.87 11/11/2021 1600   CALCIUM  9.5 02/26/2024 0908   PROT 6.1 (L) 02/09/2024 0636   ALBUMIN 3.2 (L) 02/09/2024 0636   AST 17 02/09/2024 0636   ALT 19 02/09/2024 0636   ALKPHOS 47 02/09/2024 0636   BILITOT 0.6 02/09/2024 0636   GFRNONAA 54 (L) 02/15/2024 1121   Lab Results  Component Value Date   CHOL 90 02/05/2024   HDL 39 (L) 02/05/2024   LDLCALC 41 02/05/2024   TRIG 48 02/05/2024   CHOLHDL 2.3 02/05/2024   Lab Results  Component Value Date   HGBA1C 8.0 (H) 02/26/2024   Lab Results  Component Value Date   VITAMINB12 485 01/07/2024   Lab Results  Component Value Date   TSH 1.624 01/24/2022      ASSESSMENT AND PLAN 63 y.o. year old male  has a past medical history of Alcohol dependence (HCC), Chronic calcific pancreatitis (HCC), CVA (cerebral vascular accident) (HCC) (01/24/2022), Diabetes mellitus without complication (HCC), Hyperlipidemia, Hypertension, Melanoma (HCC) (2013), Pancreatic cyst-mass?, and Polyneuropathy. here with:  Stroke:  left BG and CR infarct likely secondary to large vessel disease source  Continue clopidogrel  75 mg daily and ASA 81 mg daily for 3 months then stop ASA and continue plavix  for secondary stroke prevention.  Discussed secondary stroke prevention measures and importance of close PCP follow up for aggressive stroke risk factor management. I have gone over the pathophysiology of stroke, warning signs and symptoms, risk factors and their management in some detail with  instructions to go to the closest  emergency room for symptoms of concern. HTN: BP goal <130/90.   HLD: LDL goal <70. Recent LDL 41.  DMII: A1c goal<7.0. Recent A1c 8.8.  Encouraged patient to monitor diet and encouraged exercise FU with our office 6 months with Dr. Janett Medin or sooner if needed      Kenneth Currier, MSN, NP-C 03/25/2024, 4:15 PM [facility] [address], [address] [place], [place] [address] [phone]

## 2024-03-26 ENCOUNTER — Encounter: Payer: Self-pay | Admitting: Adult Health

## 2024-03-26 ENCOUNTER — Ambulatory Visit: Admitting: Adult Health

## 2024-03-26 VITALS — BP 139/72 | HR 65 | Ht 65.0 in | Wt 154.0 lb

## 2024-03-26 DIAGNOSIS — E119 Type 2 diabetes mellitus without complications: Secondary | ICD-10-CM | POA: Diagnosis not present

## 2024-03-26 DIAGNOSIS — I639 Cerebral infarction, unspecified: Secondary | ICD-10-CM | POA: Diagnosis not present

## 2024-03-26 DIAGNOSIS — Z8673 Personal history of transient ischemic attack (TIA), and cerebral infarction without residual deficits: Secondary | ICD-10-CM

## 2024-03-26 DIAGNOSIS — Z7984 Long term (current) use of oral hypoglycemic drugs: Secondary | ICD-10-CM

## 2024-03-26 NOTE — Patient Instructions (Signed)
 Your Plan:  Continue Aspirin  and plavix  for 3 months (from hospital discharge) then can stop ASA but continue plavix .  Blood pressure goal <130/90 Cholesterol LDL goal <70 Diabetes goal A1c <7 Monitor diet and try to exercise   Thank you for coming to see us  at [facility]. I hope we have been able to provide you high quality care today.  You may receive a patient satisfaction survey over the next few weeks. We would appreciate your feedback and comments so that we may continue to improve ourselves and the health of our patients.

## 2024-03-27 ENCOUNTER — Ambulatory Visit: Admitting: Speech Pathology

## 2024-03-27 ENCOUNTER — Ambulatory Visit: Admitting: Physical Therapy

## 2024-03-27 ENCOUNTER — Ambulatory Visit: Admitting: Occupational Therapy

## 2024-03-27 ENCOUNTER — Encounter: Payer: Self-pay | Admitting: Speech Pathology

## 2024-03-27 ENCOUNTER — Encounter: Payer: Self-pay | Admitting: Physical Therapy

## 2024-03-27 DIAGNOSIS — R471 Dysarthria and anarthria: Secondary | ICD-10-CM | POA: Diagnosis not present

## 2024-03-27 DIAGNOSIS — R208 Other disturbances of skin sensation: Secondary | ICD-10-CM

## 2024-03-27 DIAGNOSIS — M6281 Muscle weakness (generalized): Secondary | ICD-10-CM

## 2024-03-27 DIAGNOSIS — R262 Difficulty in walking, not elsewhere classified: Secondary | ICD-10-CM

## 2024-03-27 DIAGNOSIS — R2681 Unsteadiness on feet: Secondary | ICD-10-CM

## 2024-03-27 DIAGNOSIS — R1312 Dysphagia, oropharyngeal phase: Secondary | ICD-10-CM

## 2024-03-27 DIAGNOSIS — Z9181 History of falling: Secondary | ICD-10-CM

## 2024-03-27 NOTE — Therapy (Signed)
 OUTPATIENT OCCUPATIONAL THERAPY NEURO Treatment  Patient Name: Kenneth [name] MRN: [id number] DOB:[birth year], 63 y.o., male Today's Date: 03/27/2024  PCP: Dr. Therese Flash REFERRING PROVIDER: Dr. Alessandra Ancona  END OF SESSION:  OT End of Session - 03/27/24 1354     Visit Number 4    Number of Visits 12    Date for OT Re-Evaluation 05/26/24    Authorization Type [employer]    OT Start Time 1353    OT Stop Time 1433    OT Time Calculation (min) 40 min               Past Medical History:  Diagnosis Date   Alcohol dependence (HCC)    Chronic calcific pancreatitis (HCC)    CVA (cerebral vascular accident) (HCC) 01/24/2022   Diabetes mellitus without complication (HCC)    Hyperlipidemia    Hypertension    Melanoma (HCC) 2013   metastatic - resected and cured with chemo/immuno tx   Pancreatic cyst-mass?    Polyneuropathy    Past Surgical History:  Procedure Laterality Date   BIOPSY  02/03/2022   Procedure: BIOPSY;  Surgeon: [name], MD;  Location: [facility] ENDOSCOPY;  Service: Gastroenterology;;   BRAIN SURGERY  2015   to check for possible malignancy from melanoma, result were scar tissue   COLONOSCOPY WITH PROPOFOL  N/A 02/03/2022   Procedure: COLONOSCOPY WITH PROPOFOL ;  Surgeon: [name], MD;  Location: [facility] ENDOSCOPY;  Service: Gastroenterology;  Laterality: N/A;   ESOPHAGOGASTRODUODENOSCOPY (EGD) WITH PROPOFOL  N/A 02/03/2022   Procedure: ESOPHAGOGASTRODUODENOSCOPY (EGD) WITH PROPOFOL ;  Surgeon: [name], MD;  Location: [facility] ENDOSCOPY;  Service: Gastroenterology;  Laterality: N/A;   melanoma exicison     right leg   Patient Active Problem List   Diagnosis Date Noted   Onychomycosis 02/26/2024   CKD stage G2/A2, GFR 60-89 and albumin creatinine ratio 30-299 mg/g 02/13/2024   Stroke (cerebrum) (HCC) 02/08/2024   Acute focal neurological deficit 02/04/2024   Peripheral neuropathy 08/22/2023   Osteoarthritis of left knee 08/22/2023   BPV (benign positional  vertigo) 02/06/2023   Seborrhea 11/07/2022   History of melanoma 04/12/2022   Alcohol dependence in remission (HCC) 04/12/2022   Aortic atherosclerosis (HCC) 04/12/2022   Alcohol-induced chronic pancreatitis (HCC) 03/15/2022   History of CVA (cerebrovascular accident) 02/28/2022   Gastritis and gastroduodenitis    DNR (do not resuscitate) 02/01/2022   Vertigo 01/24/2022   Elevated PSA 11/24/2021   History of immunotherapy 11/25/2019   Melanoma metastatic to brain (HCC) 08/08/2016   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HCC) 08/13/2014   Metastatic melanoma to lung (HCC) 12/12/2012   Type 2 diabetes mellitus (HCC) 08/20/2007   Dyslipidemia 08/20/2007   Essential hypertension 08/19/2007    ONSET DATE: referral 02/14/24  REFERRING DIAG: CVA  THERAPY DIAG:  Muscle weakness (generalized)  Unsteadiness on feet  Other disturbances of skin sensation  Rationale for Evaluation and Treatment: Rehabilitation  SUBJECTIVE:   SUBJECTIVE STATEMENT: Pt reports he has been doing cane exercises Pt accompanied by: self  PERTINENT HISTORY: [name] is a 63 y.o. male who presented to [facility] ED with c/o slurred speech, facial droop, and R arm tingling. MRI on 02/04/2024 revealed Two adjacent acute infarcts within the left corona radiata, left lentiform nucleus and posterior limb of left internal capsule. PMHx includes alcohol dependence, chronic calcific pancreatitis, CVA, DM, HLD, HTN, melanoma, polyneuropathy  PRECAUTIONS: Fall, thick liquids- on water protocol, if teeth have been brushed  WEIGHT BEARING RESTRICTIONS: No  PAIN:  Are you having  pain? No  FALLS: Has patient fallen in last 6 months? No  LIVING ENVIRONMENT: Lives with: lives with their family Lives in: House/apartment Stairs: Yes: External: 4 steps;  Has following equipment at home: Single point cane, Walker - 2 wheeled, and shower chair  PLOF: Independent  PATIENT GOALS: get back to where I was before  OBJECTIVE:  Note:  Objective measures were completed at Evaluation unless otherwise noted.  HAND DOMINANCE: Right  ADLs: Overall ADLs: mod I with basic ADLS Transfers/ambulation related to ADLs: Eating: using LUE predominantly for eating, pt did htis before CVA, difficulty with cutting Grooming: currently using non dominant LUE UB Dressing: mod I LB Dressing: mod I Toileting: mod I Bathing: mod I Tub Shower transfers: mod I Equipment: Shower seat without back, grab bar   IADLs: Shopping: dependent Light housekeeping: dependent Meal Prep: has not attempted , previously microwave Community mobility: mod I with rollator Medication management: sister assists Financial management: sister assists Handwriting: 90% legible, with increased time  MOBILITY STATUS:  mod I with rollator     FUNCTIONAL OUTCOME MEASURES: Quick Dash: 52.5 % disability  UPPER EXTREMITY ROM:  LUE WFLS  Active ROM Right eval Left eval  Shoulder flexion 95   Shoulder abduction    Shoulder adduction    Shoulder extension    Shoulder internal rotation    Shoulder external rotation    Elbow flexion WFL   Elbow extension WFLs   Wrist flexion    Wrist extension    Wrist ulnar deviation    Wrist radial deviation    Wrist pronation WFLs   Wrist supination 90%   (Blank rows = not tested)  UPPER EXTREMITY MMT:     MMT Right eval Left eval  Shoulder flexion 3/5 4/5  Shoulder abduction    Shoulder adduction    Shoulder extension    Shoulder internal rotation    Shoulder external rotation    Middle trapezius    Lower trapezius    Elbow flexion 4/5 4+/5  Elbow extension 4/5 4+/5  Wrist flexion    Wrist extension    Wrist ulnar deviation    Wrist radial deviation    Wrist pronation    Wrist supination    (Blank rows = not tested)  HAND FUNCTION: Grip strength: Right: 26 lbs; Left: 30 lbs  COORDINATION: 9 Hole Peg test: Right: 69.13 sec; Left: 37.64 sec Box and Blocks:  Right 32blocks, Left  43blocks  SENSATION: Light touch: Impaired RUE    COGNITION: Overall cognitive status: Within functional limits for tasks assessed    VISION ASSESSMENT: Not tested- denies changes      OBSERVATIONS: Pleasant gentleman motivated to improve                                                                                                                             TREATMENT DATE: 03/27/24- Reviewed cane exercises for shoulder flexion, chest press and abduction, min v.c  for  shoulder positioning, 10 reps  each with mirror in front for feedback Pt practiced cutting food with foam grip on knife in right hand, and holding fork with left hand, min v.c.  Pt was able to sucessfully cut several pieces with repetition/ practice. Fine motor coordination activity  with cognitive component  to copy small peg design with RUE min difficulty/ v.c Removing pegs with in hand manipulation, min difficulty UBe x 5 mins level 1 for conditioning  03/19/24-Reviewed cane exercises for shoulder flexion, chest press and abduction, min v.c  10-15 reps each then coordination HEP issued, see pt instructions, min v.c Red putty exercises for sustained grip and pinch, min v.c- pt was issued putty at the hospital.  03/12/24- Flipping and dealing playing cards with RUE, rotating ball in hand for increased fine motor coordination, min difficulty/ v.c Placing metal pegs 3 sizes into pegboard with RUE then removing with in hand manipulation, min difficulty v.c  Cane exercises for shoulder flexion, abduction and chest press 15 reps each., min v.c  03/03/24- eval         PATIENT EDUCATION: Education details: cane exercises, sensory precautions Person educated: Patient Education method: Explanation, demonstration, handout Education comprehension: verbalized understanding, returned demonstraiton, v.c and demonstration   HOME EXERCISE PROGRAM: cane exercises , coordination exercises   GOALS: Goals reviewed with  patient? Yes  SHORT TERM GOALS: Target date: 04/02/24  I with HEP Baseline:dependent Goal status:  ongoing  2.  Pt will demonstrate improved RUE functional use as evidenced by increasing box/ blocks score by 4 blocks. Baseline: RUE 32 blocks, LUE 43 blocks Goal status: ongoing  3.  Pt will demonstrate improved fine motor coordiantion for ADLs as evidenced by decreasing 9 hole peg test score to 64 secs or less Baseline: RUE 69.13, LUE 37.64 Goal status: ongoing  4.  Pt will increase RUE grip strength to 30 lbs or greater for increased functional use. Baseline: RUE 26 lbs, LUE 30 lbs Goal status: ongoing  5.  I with sensory precautions Baseline: dependent Goal status:met, pt verbalizes understanding 03/27/24  6.  Pt will use his RUE to brush his teeth at least 75% of the time Baseline: uses LUE Goal status: ongoing, pt is trying to use RUE  LONG TERM GOALS: Target date: 05/26/24  I with updated HEP Baseline: dependent Goal status:ongoing  2.  Pt will perform basic home management  modified independently Baseline: dependent Goal status: INITIAL  3.  Pt will demonstrate improved fine motor coordiantion for ADLs as evidenced by decreasing 9 hole peg test score to 56 secs or less Baseline: RUE 69.13, LUE 37.64 Goal status: INITIAL   4.  Pt will improve Quick Dash score to 45% or better disability Baseline: 52.5% Goal status: ongoing  5.  Pt will report increased ease cutting food with RUE Baseline: pt reports difficulty cutting food with RUE Goal status: ongoing  6.  Pt will write a sentence with 100% legibility using RUE. Baseline: 90% legibile Goal status: ongoing  ASSESSMENT:  CLINICAL IMPRESSION: Pt is progressing towards goals. He demonstrates  improving RUE coordination and functional use.Pt demonstrates improved ability to cut food with foam grip on knife following practice.   PERFORMANCE DEFICITS: in functional skills including ADLs, IADLs, coordination,  dexterity, sensation, ROM, strength, pain, flexibility, Fine motor control, Gross motor control, mobility, balance, endurance, decreased knowledge of precautions, decreased knowledge of use of DME, and UE functional use, , and psychosocial skills including coping strategies, environmental adaptation, habits, interpersonal interactions, and routines  and behaviors.   IMPAIRMENTS: are limiting patient from ADLs, IADLs, rest and sleep, work, play, leisure, and social participation.   CO-MORBIDITIES: may have co-morbidities  that affects occupational performance. Patient will benefit from skilled OT to address above impairments and improve overall function.  MODIFICATION OR ASSISTANCE TO COMPLETE EVALUATION: No modification of tasks or assist necessary to complete an evaluation.  OT OCCUPATIONAL PROFILE AND HISTORY: Detailed assessment: Review of records and additional review of physical, cognitive, psychosocial history related to current functional performance.  CLINICAL DECISION MAKING: LOW - limited treatment options, no task modification necessary  REHAB POTENTIAL: Good  EVALUATION COMPLEXITY: Low    PLAN:  OT FREQUENCY: 1x/week  OT DURATION: 12 weeks, anticipate wrapping up after 6-8 visits due to insurance limitations, and based upon progress.  PLANNED INTERVENTIONS: 97168 OT Re-evaluation, 97535 self care/ADL training, 16109 therapeutic exercise, 97530 therapeutic activity, 97112 neuromuscular re-education, 97140 manual therapy, 97035 ultrasound, 97018 paraffin, 60454 moist heat, 97010 cryotherapy, 97014 electrical stimulation unattended, passive range of motion, functional mobility training, energy conservation, coping strategies training, patient/family education, and DME and/or AE instructions  RECOMMENDED OTHER SERVICES: PT, ST  CONSULTED AND AGREED WITH PLAN OF CARE: Patient  PLAN FOR NEXT SESSION: review coordination HEP   Toma Arts, OT 03/27/2024, 1:55  PM

## 2024-03-27 NOTE — Therapy (Signed)
 OUTPATIENT SPEECH LANGUAGE PATHOLOGY TREATMENT  Patient Name: Kenneth [name] MRN: [id number] DOB:01-07-1961, 63 y.o., male Today's Date: 03/27/2024  PCP: Graig Lawyer, MD REFERRING PROVIDER: Graig Lawyer, MD  END OF SESSION:  End of Session - 03/27/24 1234     Visit Number 5    Number of Visits 8    Date for SLP Re-Evaluation 04/27/24    SLP Start Time 1230    SLP Stop Time  1310    SLP Time Calculation (min) 40 min    Activity Tolerance Patient tolerated treatment well             Past Medical History:  Diagnosis Date   Alcohol dependence (HCC)    Chronic calcific pancreatitis (HCC)    CVA (cerebral vascular accident) (HCC) 01/24/2022   Diabetes mellitus without complication (HCC)    Hyperlipidemia    Hypertension    Melanoma (HCC) 2013   metastatic - resected and cured with chemo/immuno tx   Pancreatic cyst-mass?    Polyneuropathy    Past Surgical History:  Procedure Laterality Date   BIOPSY  02/03/2022   Procedure: BIOPSY;  Surgeon: [name], MD;  Location: [facility] ENDOSCOPY;  Service: Gastroenterology;;   BRAIN SURGERY  2015   to check for possible malignancy from melanoma, result were scar tissue   COLONOSCOPY WITH PROPOFOL  N/A 02/03/2022   Procedure: COLONOSCOPY WITH PROPOFOL ;  Surgeon: [name], MD;  Location: [facility] ENDOSCOPY;  Service: Gastroenterology;  Laterality: N/A;   ESOPHAGOGASTRODUODENOSCOPY (EGD) WITH PROPOFOL  N/A 02/03/2022   Procedure: ESOPHAGOGASTRODUODENOSCOPY (EGD) WITH PROPOFOL ;  Surgeon: [name], MD;  Location: [facility] ENDOSCOPY;  Service: Gastroenterology;  Laterality: N/A;   melanoma exicison     right leg   Patient Active Problem List   Diagnosis Date Noted   Onychomycosis 02/26/2024   CKD stage G2/A2, GFR 60-89 and albumin creatinine ratio 30-299 mg/g 02/13/2024   Stroke (cerebrum) (HCC) 02/08/2024   Acute focal neurological deficit 02/04/2024   Peripheral neuropathy 08/22/2023   Osteoarthritis of left knee  08/22/2023   BPV (benign positional vertigo) 02/06/2023   Seborrhea 11/07/2022   History of melanoma 04/12/2022   Alcohol dependence in remission (HCC) 04/12/2022   Aortic atherosclerosis (HCC) 04/12/2022   Alcohol-induced chronic pancreatitis (HCC) 03/15/2022   History of CVA (cerebrovascular accident) 02/28/2022   Gastritis and gastroduodenitis    DNR (do not resuscitate) 02/01/2022   Vertigo 01/24/2022   Elevated PSA 11/24/2021   History of immunotherapy 11/25/2019   Melanoma metastatic to brain (HCC) 08/08/2016   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HCC) 08/13/2014   Metastatic melanoma to lung (HCC) 12/12/2012   Type 2 diabetes mellitus (HCC) 08/20/2007   Dyslipidemia 08/20/2007   Essential hypertension 08/19/2007    ONSET DATE: 02/08/24   REFERRING DIAG:  Free Text Diagnosis  CVA    THERAPY DIAG:  Dysarthria and anarthria  Oropharyngeal dysphagia  Rationale for Evaluation and Treatment: Rehabilitation  SUBJECTIVE:   SUBJECTIVE STATEMENT: Pt reports he had to repeat himself x1 to a friend  Pt accompanied by: self, sisters are bringing to therapy  PERTINENT HISTORY: Per chart review: 63 y.o. right-handed male with history of HTN, CVA, recent fall due to RLE weakness, EtOH abuse in the past, T2DM, polyneuropathy, melanoma with metastasis to the brain and lung (in remission)   PAIN:  Are you having pain? No  FALLS: Has patient fallen in last 6 months?  No  LIVING ENVIRONMENT: Lives with:  sister Aisha Hove) Lives in: House/apartment  PLOF:  Level  of assistance: Independent with ADLs, Independent with IADLs Employment: Other: working on disability. Out of work since CVA in 2023. 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   PATIENT GOALS: speech, swallowing  OBJECTIVE:  Note: Objective measures were completed at Evaluation unless otherwise noted.  DIAGNOSTIC FINDINGS: See EMR for full report.     COGNITION: Overall cognitive status: Within functional  limits for tasks assessed; pt reports no changes. Assessment in CIR deemed cognition WFL.  Areas of impairment:  NA Functional deficits: NA   MOTOR SPEECH: Overall motor speech: impaired Level of impairment: Phrase, Sentence, and Conversation Respiration:  WFL Phonation: normal Resonance: WFL Articulation: Impaired: phrase, sentence, and conversation Intelligibility: Intelligibility reduced Motor planning: Appears intact Motor speech errors:  NA Interfering components:  NA Effective technique: slow rate, increased vocal intensity, and over articulate  ORAL MOTOR EXAMINATION: Overall status: Impaired:   Labial: Right (Symmetry and Strength) Lingual: Right (Symmetry and Strength) Facial: Right (Symmetry and Strength) Velum: ROM Cough: WFL Comments:   RECOMMENDATIONS FROM OBJECTIVE SWALLOW STUDY (MBSS/FEES):    Modified Barium Swallow 02/12/24  Recommendations/Plan: Swallowing Evaluation Recommendations Swallowing Evaluation Recommendations Recommendations: PO diet PO Diet Recommendation: Regular; Mildly thick liquids (Level 2, nectar thick) Liquid Administration via: Spoon; Cup; No straw Medication Administration: Whole meds with puree Supervision: Patient able to self-feed; Intermittent supervision/cueing for swallowing strategies Swallowing strategies  : Minimize environmental distractions; Slow rate; Small bites/sips Postural changes: Position pt fully upright for meals Oral care recommendations: Oral care BID (2x/day) Caregiver Recommendations: Avoid jello, ice cream, thin soups, popsicles; Remove water pitcher  Modified Barium Swallow 02/05/24  Recommendations/Plan: Swallowing Evaluation Recommendations Swallowing Evaluation Recommendations Recommendations: PO diet PO Diet Recommendation: Dysphagia 3 (Mechanical soft); Moderately thick liquids (Level 3, honey thick) Liquid Administration via: Spoon; Cup Medication Administration: Whole meds with  puree Supervision: Patient able to self-feed; Intermittent supervision/cueing for swallowing strategies Swallowing strategies  : Minimize environmental distractions; Slow rate; Small bites/sips Postural changes: Position pt fully upright for meals Oral care recommendations: Oral care BID (2x/day) Caregiver Recommendations: Avoid jello, ice cream, thin soups, popsicles; Remove water pitcher     CLINICAL SWALLOW ASSESSMENT:   Current diet: regular and nectar thick liquids Dentition: adequate natural dentition Patient directly observed with POs: No Feeding: able to feed self Liquids provided by:  Not tested; no straws Oral phase signs and symptoms:  Not tested Pharyngeal phase signs and symptoms:  Not tested Comments: To be assessed next session.    PATIENT REPORTED OUTCOME MEASURES (PROM): EAT-10: 14                                                                                                                            TREATMENT DATE:   03/27/24: Pt was seen for skilled ST services targeting dysphagia. Pt reports he hasn't noticed any instances of becoming strangled with water (or any other solid/thickened liquid). He is currently completing swallowing exercises x1/day, with the effortful swallow throughout the  day.   CTAR: x20 - 5 second hold  Masako x20  - required extra time to complete; became to initiate swallow in the final 10.   Effortful Swallow x10  -indep completed  Supraglottic Swallow x6 - x4 overt s/sx of aspiration (immediate cough). SLP suspects muscle fatigue due to completion of exercises prior. Discontinued these exercises with water.   Speech was 100% intelligible in quiet area with familiar listener. Pt reports he knows what strategies to use when he needs them.   03/19/24: Pt was seen for skilled ST services targeting dysphagia and dysarthria. Pt reported he went to steakhouse for his birthday and everything "went down good". He reports he brought his nectar  thick liquid packet with him to restaurant and requested no ice. He has been completing exercises at home x1/day. Pt reports he did not practice his functional phrases, but he reports he tried to "be clear" when speaking to his sister. He reports he has not had repeat himself very often - "it is tougher (to speak) in the evening". Pt and SLP reviewed functional strategies from last session. Pt read functional phrases with no speech errors. Pt does not have any questions about speech intelligibility strategies and strategies learned in CIR ("SLOP").    Pt completed oral care and SLP facilitated FFWP while completing effortful swallow exercise. SLP encouraged pt to complete oral care at least 3x a day - after meals and ALWAYS before water. Pt was unable to complete with max cueing. To continue to trial. SLP introduced supraglottic swallow. Pt was able to complete with min cueing. To continue to practice use while drinking water at home.     Pt reports biting R cheek while eating - SLP suggested trying to masticate on opposite side, as well as taking smaller bites.   03/12/24: Pt was seen for skilled ST services targeting dysphagia and dysarthria. Pt forgot toothbrush, so unable to do liquid trials (FFWP) this session. Pt reports he has been completing all swallowing exercises once a day. Pt reports speech has been "alright", but that he has to repeat himself throughout day ~5x. SLP educated on "Be Clear" strategies. SLP assisted pt in developing functional phrases to practice throughout the day. SLP educated on when he would be most likely to use strategies re: drive thru, fatigued, phone. Pt to continue practice at home.    03/03/24: Pt was seen for skilled ST services targeting dysphagia. Of note, pt reports his speech is 80% back to baseline at this time - more slurred when tired. When prompted, pt reports he has only completed exercises twice since last being seen - SLP encouraged to complete everyday. SLP  suggested creating a routine to ensure exercises completion.   Completed the following exercises: CTAR: x10  - 5 second hold  Masako x10  - required extra time to complete; difficult to initiate. Trialed tongue at different places.   Effortful Swallow  -Visibly, SLP did not see any increased effort - though, pt reported it felt different. To continue practice.   Pt completed oral care prior to water trials today. SLP observed immediate and delayed coughs after every sip of water. SLP reviewed Consuella Denis Protocol and the importance of completing oral care prior to water trials. SLP observed pt with regular and NTL trial. Pt was able to masticate effectively. No residue observed after liquid wash. No solids were left in buccal cavity. Pt performed a finger sweep to check for pocketing due to decreased sensation. Safe  to continue regular and NTL diet.      PATIENT EDUCATION: Education details: SLP role  Person educated: Patient Education method: Chief Technology Officer Education comprehension: needs further education   GOALS: Goals reviewed with patient? Yes  SHORT TERM GOALS: Target date: 03/28/24  Pt will demonstrate use of pharyngeal strengthening exercises independently. Baseline: Goal status: INITIAL  2.  Pt will recall safe swallow strategies independently Baseline:  Goal status: INITIAL  3.  Pt will recall speech intelligibility compensations with minA verbal cues Baseline:  Goal status: INITIAL    LONG TERM GOALS: Target date: 04/27/24  Pt will improve score on PROMs Baseline: EAT-10: 14 Goal status: INITIAL  2.  Pt will utilize speech intelligibility compensations in conversation independently. Baseline:  Goal status: INITIAL  3.  Pt will recall 3 environmental modifications for dysarthria Baseline:  Goal status: INITIAL    ASSESSMENT:  CLINICAL IMPRESSION: Pt is a 63 yo male who presents to ST OP for evaluation post CVA. Pt received therapies  in inpatient rehab and discharged last week. Pt endorses difficulties with speech and swallowing. Pt was observed to be ~90% intelligible in conversations in a quiet environment. Pt speech characterized by re: imprecise articulation. Pt reports feeling like his "tongue is too big for his mouth".  Pt to bring food next session for obeservation. Reports no difficulty with solids; however, was discharged on NTL (mildly thick) liquids and [employer] Protocol (FFWP). Pt was able to recall steps to water protocol for safe consumption. SLP provided pt with handout on safe swallow strategies and pharyngeal exercises he was completing in CIR. SLP rec skilled ST services to address dysarthria and dysphagia to maximize functional communication and increased safety with swallowing respectively.     OBJECTIVE IMPAIRMENTS: include dysarthria and dysphagia. These impairments are limiting patient from effectively communicating at home and in community and safety when swallowing. Factors affecting potential to achieve goals and functional outcome are  NA . Patient will benefit from skilled SLP services to address above impairments and improve overall function.  REHAB POTENTIAL: Good; limited by insurance  PLAN:  SLP FREQUENCY: 1-2x/week  SLP DURATION: 8 weeks  PLANNED INTERVENTIONS: Aspiration precaution training, Pharyngeal strengthening exercises, Diet toleration management , Environmental controls, Trials of upgraded texture/liquids, Cueing hierachy, Functional tasks, SLP instruction and feedback, Compensatory strategies, Patient/family education, [phone] Treatment of speech (30 or 45 min) , and [id number] Treatment of swallowing function    [employer], CCC-SLP 03/27/2024, 12:35 PM

## 2024-03-27 NOTE — Therapy (Signed)
 OUTPATIENT PHYSICAL THERAPY LOWER EXTREMITY TREATMENT   Patient Name: Kenneth [name] MRN: [id number] DOB:05-11-1961, 63 y.o., male Today's Date: 03/27/2024  END OF SESSION:  PT End of Session - 03/27/24 1252     Visit Number 4    Number of Visits 13    Date for PT Re-Evaluation 05/27/24    Authorization Type [employer] MCD    PT Start Time 1313    PT Stop Time 1400    PT Time Calculation (min) 47 min    Equipment Utilized During Treatment Gait belt    Activity Tolerance Patient tolerated treatment well    Behavior During Therapy WFL for tasks assessed/performed                Past Medical History:  Diagnosis Date   Alcohol dependence (HCC)    Chronic calcific pancreatitis (HCC)    CVA (cerebral vascular accident) (HCC) 01/24/2022   Diabetes mellitus without complication (HCC)    Hyperlipidemia    Hypertension    Melanoma (HCC) 2013   metastatic - resected and cured with chemo/immuno tx   Pancreatic cyst-mass?    Polyneuropathy    Past Surgical History:  Procedure Laterality Date   BIOPSY  02/03/2022   Procedure: BIOPSY;  Surgeon: [name], MD;  Location: [facility] ENDOSCOPY;  Service: Gastroenterology;;   BRAIN SURGERY  2015   to check for possible malignancy from melanoma, result were scar tissue   COLONOSCOPY WITH PROPOFOL  N/A 02/03/2022   Procedure: COLONOSCOPY WITH PROPOFOL ;  Surgeon: [name], MD;  Location: [facility] ENDOSCOPY;  Service: Gastroenterology;  Laterality: N/A;   ESOPHAGOGASTRODUODENOSCOPY (EGD) WITH PROPOFOL  N/A 02/03/2022   Procedure: ESOPHAGOGASTRODUODENOSCOPY (EGD) WITH PROPOFOL ;  Surgeon: [name], MD;  Location: [facility] ENDOSCOPY;  Service: Gastroenterology;  Laterality: N/A;   melanoma exicison     right leg   Patient Active Problem List   Diagnosis Date Noted   Onychomycosis 02/26/2024   CKD stage G2/A2, GFR 60-89 and albumin creatinine ratio 30-299 mg/g 02/13/2024   Stroke (cerebrum) (HCC) 02/08/2024   Acute focal neurological  deficit 02/04/2024   Peripheral neuropathy 08/22/2023   Osteoarthritis of left knee 08/22/2023   BPV (benign positional vertigo) 02/06/2023   Seborrhea 11/07/2022   History of melanoma 04/12/2022   Alcohol dependence in remission (HCC) 04/12/2022   Aortic atherosclerosis (HCC) 04/12/2022   Alcohol-induced chronic pancreatitis (HCC) 03/15/2022   History of CVA (cerebrovascular accident) 02/28/2022   Gastritis and gastroduodenitis    DNR (do not resuscitate) 02/01/2022   Vertigo 01/24/2022   Elevated PSA 11/24/2021   History of immunotherapy 11/25/2019   Melanoma metastatic to brain (HCC) 08/08/2016   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HCC) 08/13/2014   Metastatic melanoma to lung (HCC) 12/12/2012   Type 2 diabetes mellitus (HCC) 08/20/2007   Dyslipidemia 08/20/2007   Essential hypertension 08/19/2007    PCP: Graig Lawyer, MD  REFERRING PROVIDER: Alessandra Ancona, MD  REFERRING DIAG: Diagnosis R29.898 (ICD-10-CM) - Right leg weakness Z86.73 (ICD-10-CM) - History of CVA (cerebrovascular accident)  THERAPY DIAG:  Muscle weakness (generalized)  Unsteadiness on feet  Difficulty in walking, not elsewhere classified  History of falling  Rationale for Evaluation and Treatment: Rehabilitation  ONSET DATE: 02/04/24  SUBJECTIVE:   SUBJECTIVE STATEMENT:  Doing OK, I think I am moving better and less issues with turning around   Patient was being seen here for leg pain and weakness in February.  He was admitted to the hospital on 02/04/24, was discharged on 02/16/24.   Kenneth C.  [name] is a 63 y.o. male who presented to [facility] ED with c/o slurred speech, facial droop, and R arm tingling. MRI on 02/04/2024 revealed Two adjacent acute infarcts within the left corona radiata, left lentiform nucleus and posterior limb of left internal capsule. PMHx includes alcohol dependence, chronic calcific pancreatitis, CVA, DM, HLD, HTN, melanoma, polyneuropathy   PERTINENT HISTORY: See above  PAIN:  Are you having pain?  No 0/10 now   PRECAUTIONS: Fall  RED FLAGS: None   WEIGHT BEARING RESTRICTIONS: No  FALLS:  Has patient fallen in last 6 months? Yes, 1 LIVING ENVIRONMENT: Lives with: lives with their family Lives in: House/apartment- townhouse  Stairs: No Has following equipment at home: Environmental consultant - 2 wheeled and Wheelchair (manual)  OCCUPATION: not working   PLOF: Independent with gait, Independent with transfers, and Requires assistive device for independence  PATIENT GOALS: to be able to get around on my own completely, be more independent   NEXT MD VISIT: Rudd tomorrow, neurologist April 28th  OBJECTIVE:  Note: Objective measures were completed at Evaluation unless otherwise noted.  DIAGNOSTIC FINDINGS:   02/04/24 IMPRESSION: 1. Two adjacent acute infarcts within the left corona radiata, left lentiform nucleus and posterior limb of left internal capsule (individually measuring up to 2 cm). 2. Moderate-sized focus of chronic cortical encephalomalacia/gliosis within the anterolateral left frontal lobe (deep to a cranioplasty). 3. Background parenchymal atrophy, chronic small vessel ischemic disease and chronic lacunar infarcts, as described. 4. Several chronic microhemorrhages in a distribution suggesting sequelae of hypertensive microangiopathy. 5. Mild inflammatory left maxillary sinus disease. COGNITION: Overall cognitive status: Within functional limits for tasks assessed, slurred speech     SENSATION: Not tested hx of neuropathy   COORDINATION  Impaired coordination R LE, facial droop right, slurred speech and difficulty blinking right eye   LOWER EXTREMITY MMT:  MMT Right eval Left eval  Hip flexion 3+ 4+  Hip extension    Hip abduction 4- 5  Hip adduction    Hip internal rotation    Hip external rotation    Knee flexion 4- 5  Knee extension 4- 5  Ankle dorsiflexion 4- 4-  Ankle plantarflexion    Ankle inversion    Ankle eversion     (Blank rows = not  tested)   FUNCTIONAL TESTS:  TUG: 26 seconds with 4WW 5 times sit to stand: 25 seconds use of BUEs on chair, posterior unsteadiness/poor eccentric control  Berg: 24/56 3 minute walk test: 350 feet with 4WW, c/o fatigue and the right foot started to drag  GAIT: Distance walked: 382ft Assistive device utilized: 4WW Level of assistance: SBA Comments:  limited WB RLE, limited ankle DF and knee ROM during gait cycle , mild toe drag when fatigued                                                                                                                                TREATMENT DATE:  03/27/24  Nustep level 5 x 8 minutes 20# HS curls 5# leg extension 2x10 , then right leg only 2x5 On airex reaching for numbers alternating hands STS with weighted ball reach, this was tough for him needed cues for nose over toes Standing ball toss with him holding walker and then trying without but it in front of him Tandem stance blue foam 2x30 seconds Side steps on solid surface progressing to blue foam pad x3 rounds total Narrow BOS on blue foam with head turns  Gait with CGA and gait belt with SPC x 200 feet Then without device  03/19/24  Nustep L5x8 minutes seat 7   Standing regular BOS blue foam pad 3x30 seconds close S  Standing on foam with cross midline reaches to random numbers  Standing on blue foam pad with toe taps to targets (random call outs) U UE support min guard, random combinations   Standing marches 3# 2x10 B Standing HS curls 3# 2x10 B Standing hip ABD 3# 2x10 B Squats in // bars 2x10   03/14/24 NuStep L5x70mins  STS 2x10 LAQ 2# 2x10 Standing marches 2# 20 reps alt x2 HS curls green 2x10 Box taps 4" by stairs 1HRA, then x10 alt without  TUG- 23s   PATIENT EDUCATION:  Education details: exam, POC, HEP  Person educated: Patient Education method: Explanation, Demonstration, and Handouts Education comprehension: verbalized understanding, returned demonstration, and  needs further education  HOME EXERCISE PROGRAM: Access Code: [email or web address] URL: [email or web address] Date: 02/25/2024 Prepared by: Cherylene Corrente  Exercises - Seated Long Arc Quad  - 2 x daily - 7 x weekly - 2 sets - 10 reps - 3 hold - Seated March  - 2 x daily - 7 x weekly - 2 sets - 10 reps - 3 hold - Seated Heel Toe Raises  - 2 x daily - 7 x weekly - 2 sets - 10 reps - 3 hold  ASSESSMENT:  CLINICAL IMPRESSION:    Pt arrives today doing OK, continued to work on strength and balance.  He did some working on gait with cane and without , he did well and even though I did CGA I feel that he was pretty steady   Patient is a 63 y.o. M who was seen today for physical therapy evaluation and treatment for s/p CVA, difficulty walking and weakness.   RLE is weaker than the L.  Of more concern is general balance and coordination- didn't score well on Berg and is definitely a high fall risk. TUG and 5XSTS put him at risk for falls as well, with standing balance he tended to be back on his heels and did lose balance twice on the Berg requiring mod A to correct.  Will benefit from skilled PT services to address all findings and assist in return to optimal level of function.   OBJECTIVE IMPAIRMENTS: Abnormal gait, decreased activity tolerance, decreased balance, decreased knowledge of use of DME, decreased mobility, difficulty walking, and decreased strength.   ACTIVITY LIMITATIONS: sitting, standing, squatting, stairs, transfers, and locomotion level  PARTICIPATION LIMITATIONS: driving, shopping, community activity, occupation, and yard work  PERSONAL FACTORS: Age, Fitness, Past/current experiences, Social background, and Time since onset of injury/illness/exacerbation are also affecting patient's functional outcome.   REHAB POTENTIAL: Good  CLINICAL DECISION MAKING: Stable/uncomplicated  EVALUATION COMPLEXITY: Low   GOALS: Goals reviewed with patient? No  SHORT TERM GOALS:  Target date: 03/24/24   Will be compliant with appropriate progressive HEP  Baseline: Goal status: progressing 03/14/24  2.  Will score at least 35 on Berg to show improved balance  Baseline:  Goal status: ongoing 03/27/24  3.  Will be able to name 3 ways to prevent fall at home and in the community  Baseline:  Goal status: no carpets, no loose rugs, has a lights in room so can see at night MET 03/14/24    LONG TERM GOALS: Target date: 05/27/24    MMT to have improved by one grade in all weak groups Baseline:  Goal status: INITIAL  2.  Will score at least 42 on Berg to show reduced fall risk  Baseline:  Goal status: INITIAL  3.  Will be able to perform all daily dressing/self care activities on an independent basis without LOB  Baseline:  Goal status: INITIAL  4.  Will be able to ambulate at least 562ft in with LRAD to show improved community access, fatigue no more than 3/10 Baseline:  Goal status: INITIAL  5.  Decrease TUG time to 15 seconds Baseline:  Goal status: 23s 03/14/24     PLAN:  PT FREQUENCY: 1-2x/week  PT DURATION: 12 weeks  PLANNED INTERVENTIONS: 97164- PT Re-evaluation, 97110-Therapeutic exercises, 97530- Therapeutic activity, 97112- Neuromuscular re-education, 97535- Self Care, 78295- Manual therapy, [id number]- Gait training, [id number]- Aquatic Therapy, Patient/Family education, Balance training, Stair training, and Moist heat  PLAN FOR NEXT SESSION: heavy focus on balance, strengthening as able, general conditioning. Safety training/education as needed   Cherylene Corrente, PT 03/27/24 12:53 PM   For all possible CPT codes, reference the Planned Interventions line above.     Check all conditions that are expected to impact treatment: {Conditions expected to impact treatment:Morbid obesity, Diabetes mellitus, Neurological condition and/or seizures, and Social determinants of health   If treatment provided at initial evaluation, no treatment charged due  to lack of authorization.

## 2024-03-28 NOTE — Progress Notes (Signed)
 I agree with the above plan

## 2024-03-31 ENCOUNTER — Encounter: Attending: Physical Medicine and Rehabilitation | Admitting: Physical Medicine and Rehabilitation

## 2024-03-31 ENCOUNTER — Ambulatory Visit: Admitting: Dermatology

## 2024-03-31 NOTE — Therapy (Signed)
 OUTPATIENT PHYSICAL THERAPY LOWER EXTREMITY TREATMENT   Patient Name: Kenneth [name] MRN: [id number] DOB:[birth year], 63 y.o., male Today's Date: 04/01/2024  END OF SESSION:  PT End of Session - 04/01/24 1142     Visit Number 5    Number of Visits 13    Date for PT Re-Evaluation 05/27/24    Authorization Type [employer] MCD    PT Start Time 1142    PT Stop Time 1230    PT Time Calculation (min) 48 min    Equipment Utilized During Treatment Gait belt    Activity Tolerance Patient tolerated treatment well    Behavior During Therapy WFL for tasks assessed/performed                 Past Medical History:  Diagnosis Date   Alcohol dependence (HCC)    Chronic calcific pancreatitis (HCC)    CVA (cerebral vascular accident) (HCC) 01/24/2022   Diabetes mellitus without complication (HCC)    Hyperlipidemia    Hypertension    Melanoma (HCC) 2013   metastatic - resected and cured with chemo/immuno tx   Pancreatic cyst-mass?    Polyneuropathy    Past Surgical History:  Procedure Laterality Date   BIOPSY  02/03/2022   Procedure: BIOPSY;  Surgeon: [name], MD;  Location: [facility] ENDOSCOPY;  Service: Gastroenterology;;   BRAIN SURGERY  2015   to check for possible malignancy from melanoma, result were scar tissue   COLONOSCOPY WITH PROPOFOL  N/A 02/03/2022   Procedure: COLONOSCOPY WITH PROPOFOL ;  Surgeon: [name], MD;  Location: [facility] ENDOSCOPY;  Service: Gastroenterology;  Laterality: N/A;   ESOPHAGOGASTRODUODENOSCOPY (EGD) WITH PROPOFOL  N/A 02/03/2022   Procedure: ESOPHAGOGASTRODUODENOSCOPY (EGD) WITH PROPOFOL ;  Surgeon: [name], MD;  Location: [facility] ENDOSCOPY;  Service: Gastroenterology;  Laterality: N/A;   melanoma exicison     right leg   Patient Active Problem List   Diagnosis Date Noted   Onychomycosis 02/26/2024   CKD stage G2/A2, GFR 60-89 and albumin creatinine ratio 30-299 mg/g 02/13/2024   Stroke (cerebrum) (HCC) 02/08/2024   Acute focal neurological  deficit 02/04/2024   Peripheral neuropathy 08/22/2023   Osteoarthritis of left knee 08/22/2023   BPV (benign positional vertigo) 02/06/2023   Seborrhea 11/07/2022   History of melanoma 04/12/2022   Alcohol dependence in remission (HCC) 04/12/2022   Aortic atherosclerosis (HCC) 04/12/2022   Alcohol-induced chronic pancreatitis (HCC) 03/15/2022   History of CVA (cerebrovascular accident) 02/28/2022   Gastritis and gastroduodenitis    DNR (do not resuscitate) 02/01/2022   Vertigo 01/24/2022   Elevated PSA 11/24/2021   History of immunotherapy 11/25/2019   Melanoma metastatic to brain (HCC) 08/08/2016   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HCC) 08/13/2014   Metastatic melanoma to lung (HCC) 12/12/2012   Type 2 diabetes mellitus (HCC) 08/20/2007   Dyslipidemia 08/20/2007   Essential hypertension 08/19/2007    PCP: Graig Lawyer, MD  REFERRING PROVIDER: Alessandra Ancona, MD  REFERRING DIAG: Diagnosis R29.898 (ICD-10-CM) - Right leg weakness Z86.73 (ICD-10-CM) - History of CVA (cerebrovascular accident)  THERAPY DIAG:  Muscle weakness (generalized)  Unsteadiness on feet  Other disturbances of skin sensation  Difficulty in walking, not elsewhere classified  Rationale for Evaluation and Treatment: Rehabilitation  ONSET DATE: 02/04/24  SUBJECTIVE:   SUBJECTIVE STATEMENT:  Doing OK, I think I am moving better and less issues with turning around   Patient was being seen here for leg pain and weakness in February.  He was admitted to the hospital on 02/04/24, was discharged on 02/16/24.  Kenneth [name] is a 62 y.o. male who presented to [facility] ED with c/o slurred speech, facial droop, and R arm tingling. MRI on 02/04/2024 revealed Two adjacent acute infarcts within the left corona radiata, left lentiform nucleus and posterior limb of left internal capsule. PMHx includes alcohol dependence, chronic calcific pancreatitis, CVA, DM, HLD, HTN, melanoma, polyneuropathy   PERTINENT HISTORY: See above  PAIN:  Are  you having pain? No 0/10 now   PRECAUTIONS: Fall  RED FLAGS: None   WEIGHT BEARING RESTRICTIONS: No  FALLS:  Has patient fallen in last 6 months? Yes, 1 LIVING ENVIRONMENT: Lives with: lives with their family Lives in: House/apartment- townhouse  Stairs: No Has following equipment at home: Environmental consultant - 2 wheeled and Wheelchair (manual)  OCCUPATION: not working   PLOF: Independent with gait, Independent with transfers, and Requires assistive device for independence  PATIENT GOALS: to be able to get around on my own completely, be more independent   NEXT MD VISIT: Rudd tomorrow, neurologist April 28th  OBJECTIVE:  Note: Objective measures were completed at Evaluation unless otherwise noted.  DIAGNOSTIC FINDINGS:   02/04/24 IMPRESSION: 1. Two adjacent acute infarcts within the left corona radiata, left lentiform nucleus and posterior limb of left internal capsule (individually measuring up to 2 cm). 2. Moderate-sized focus of chronic cortical encephalomalacia/gliosis within the anterolateral left frontal lobe (deep to a cranioplasty). 3. Background parenchymal atrophy, chronic small vessel ischemic disease and chronic lacunar infarcts, as described. 4. Several chronic microhemorrhages in a distribution suggesting sequelae of hypertensive microangiopathy. 5. Mild inflammatory left maxillary sinus disease. COGNITION: Overall cognitive status: Within functional limits for tasks assessed, slurred speech     SENSATION: Not tested hx of neuropathy   COORDINATION  Impaired coordination R LE, facial droop right, slurred speech and difficulty blinking right eye   LOWER EXTREMITY MMT:  MMT Right eval Left eval  Hip flexion 3+ 4+  Hip extension    Hip abduction 4- 5  Hip adduction    Hip internal rotation    Hip external rotation    Knee flexion 4- 5  Knee extension 4- 5  Ankle dorsiflexion 4- 4-  Ankle plantarflexion    Ankle inversion    Ankle eversion     (Blank  rows = not tested)   FUNCTIONAL TESTS:  TUG: 26 seconds with 4WW 5 times sit to stand: 25 seconds use of BUEs on chair, posterior unsteadiness/poor eccentric control  Berg: 24/56 3 minute walk test: 350 feet with 4WW, c/o fatigue and the right foot started to drag  GAIT: Distance walked: 367ft Assistive device utilized: 4WW Level of assistance: SBA Comments:  limited WB RLE, limited ankle DF and knee ROM during gait cycle , mild toe drag when fatigued                                                                                                                                TREATMENT DATE:  04/01/24 NuStep L5x70mins  Walking with SPC, then without CGA  Side steps CGA  Walking forwards and backwards CGA STS 2x10 Step ups 4" 1HRA   Tandem stance in bars 15s at best    03/27/24 Nustep level 5 x 8 minutes 20# HS curls 5# leg extension 2x10 , then right leg only 2x5 On airex reaching for numbers alternating hands STS with weighted ball reach, this was tough for him needed cues for nose over toes Standing ball toss with him holding walker and then trying without but it in front of him Tandem stance blue foam 2x30 seconds Side steps on solid surface progressing to blue foam pad x3 rounds total Narrow BOS on blue foam with head turns  Gait with CGA and gait belt with SPC x 200 feet Then without device  03/19/24  Nustep L5x8 minutes seat 7   Standing regular BOS blue foam pad 3x30 seconds close S  Standing on foam with cross midline reaches to random numbers  Standing on blue foam pad with toe taps to targets (random call outs) U UE support min guard, random combinations   Standing marches 3# 2x10 B Standing HS curls 3# 2x10 B Standing hip ABD 3# 2x10 B Squats in // bars 2x10   03/14/24 NuStep L5x68mins  STS 2x10 LAQ 2# 2x10 Standing marches 2# 20 reps alt x2 HS curls green 2x10 Box taps 4" by stairs 1HRA, then x10 alt without  TUG- 23s   PATIENT EDUCATION:   Education details: exam, POC, HEP  Person educated: Patient Education method: Explanation, Demonstration, and Handouts Education comprehension: verbalized understanding, returned demonstration, and needs further education  HOME EXERCISE PROGRAM: Access Code: [email or web address] URL: [email or web address] Date: 02/25/2024 Prepared by: Cherylene Corrente  Exercises - Seated Long Arc Quad  - 2 x daily - 7 x weekly - 2 sets - 10 reps - 3 hold - Seated March  - 2 x daily - 7 x weekly - 2 sets - 10 reps - 3 hold - Seated Heel Toe Raises  - 2 x daily - 7 x weekly - 2 sets - 10 reps - 3 hold  ASSESSMENT:  CLINICAL IMPRESSION: Pt arrives today doing OK, continued to work on strength and balance.  Patient requested to work on gait with cane and without, he did well only some slight unsteadiness with turning corners. Did report his legs felt tired after the NuStep, so we rested before continuing with walking again. He has the most difficulty with backwards stepping. Cues needed for STS to avoid shifting weight on to heels. Patient will be placed on a 2 week hold to get a swallow study done, and in order to stay on the same schedule we will hold for all 3 disciplines. He will return to complete 2 more weeks after the hold and then save the last 2 for later in the year.   Patient is a 63 y.o. M who was seen today for physical therapy evaluation and treatment for s/p CVA, difficulty walking and weakness.   RLE is weaker than the L.  Of more concern is general balance and coordination- didn't score well on Berg and is definitely a high fall risk. TUG and 5XSTS put him at risk for falls as well, with standing balance he tended to be back on his heels and did lose balance twice on the Berg requiring mod A to correct.  Will benefit from skilled PT services to address all findings and assist in return to optimal level  of function.   OBJECTIVE IMPAIRMENTS: Abnormal gait, decreased activity tolerance,  decreased balance, decreased knowledge of use of DME, decreased mobility, difficulty walking, and decreased strength.   ACTIVITY LIMITATIONS: sitting, standing, squatting, stairs, transfers, and locomotion level  PARTICIPATION LIMITATIONS: driving, shopping, community activity, occupation, and yard work  PERSONAL FACTORS: Age, Fitness, Past/current experiences, Social background, and Time since onset of injury/illness/exacerbation are also affecting patient's functional outcome.   REHAB POTENTIAL: Good  CLINICAL DECISION MAKING: Stable/uncomplicated  EVALUATION COMPLEXITY: Low   GOALS: Goals reviewed with patient? No  SHORT TERM GOALS: Target date: 03/24/24   Will be compliant with appropriate progressive HEP  Baseline: Goal status: progressing 03/14/24  2.  Will score at least 35 on Berg to show improved balance  Baseline:  Goal status: ongoing 03/27/24  3.  Will be able to name 3 ways to prevent fall at home and in the community  Baseline:  Goal status: no carpets, no loose rugs, has a lights in room so can see at night MET 03/14/24    LONG TERM GOALS: Target date: 05/27/24    MMT to have improved by one grade in all weak groups Baseline:  Goal status: INITIAL  2.  Will score at least 42 on Berg to show reduced fall risk  Baseline:  Goal status: INITIAL  3.  Will be able to perform all daily dressing/self care activities on an independent basis without LOB  Baseline:  Goal status: INITIAL  4.  Will be able to ambulate at least 576ft in with LRAD to show improved community access, fatigue no more than 3/10 Baseline:  Goal status: INITIAL  5.  Decrease TUG time to 15 seconds Baseline:  Goal status: 23s 03/14/24     PLAN:  PT FREQUENCY: 1-2x/week  PT DURATION: 12 weeks  PLANNED INTERVENTIONS: 97164- PT Re-evaluation, 97110-Therapeutic exercises, 97530- Therapeutic activity, 97112- Neuromuscular re-education, 97535- Self Care, 96045- Manual therapy,  [phone]- Gait training, [phone]- Aquatic Therapy, Patient/Family education, Balance training, Stair training, and Moist heat  PLAN FOR NEXT SESSION: heavy focus on balance, strengthening as able, general conditioning. Safety training/education as needed   Cherylene Corrente, PT 04/01/24 12:29 PM   For all possible CPT codes, reference the Planned Interventions line above.     Check all conditions that are expected to impact treatment: {Conditions expected to impact treatment:Morbid obesity, Diabetes mellitus, Neurological condition and/or seizures, and Social determinants of health   If treatment provided at initial evaluation, no treatment charged due to lack of authorization.

## 2024-04-01 ENCOUNTER — Ambulatory Visit

## 2024-04-01 ENCOUNTER — Encounter: Payer: Self-pay | Admitting: Speech Pathology

## 2024-04-01 ENCOUNTER — Ambulatory Visit: Admitting: Occupational Therapy

## 2024-04-01 ENCOUNTER — Encounter: Payer: Self-pay | Admitting: Occupational Therapy

## 2024-04-01 ENCOUNTER — Ambulatory Visit: Admitting: Speech Pathology

## 2024-04-01 DIAGNOSIS — R2681 Unsteadiness on feet: Secondary | ICD-10-CM

## 2024-04-01 DIAGNOSIS — R471 Dysarthria and anarthria: Secondary | ICD-10-CM | POA: Diagnosis not present

## 2024-04-01 DIAGNOSIS — R208 Other disturbances of skin sensation: Secondary | ICD-10-CM

## 2024-04-01 DIAGNOSIS — R278 Other lack of coordination: Secondary | ICD-10-CM

## 2024-04-01 DIAGNOSIS — R262 Difficulty in walking, not elsewhere classified: Secondary | ICD-10-CM

## 2024-04-01 DIAGNOSIS — R1312 Dysphagia, oropharyngeal phase: Secondary | ICD-10-CM

## 2024-04-01 DIAGNOSIS — M6281 Muscle weakness (generalized): Secondary | ICD-10-CM

## 2024-04-01 NOTE — Patient Instructions (Signed)
    (  Exercise) Monday Tuesday Wednesday Thursday Friday Saturday Sunday   coordiantion activities- cards, coins           red putty           cane exercises           walking- with rollator          swallowing exercises                                                                                Coordination Activities  Perform the following activities for 10-20 minutes 1 times per day with right hand(s).  Rotate ball in fingertips (clockwise and counter-clockwise). Flip cards 1 at a time by turning hand over with palm up to release Deal cards with your thumb (Hold deck in hand and push card off top with thumb). Pick up coins, buttons, marbles, dried beans/pasta of different sizes and place in container. Pick up coins and place in container or coin bank. Pick up coins and stack. Pick up coins one at a time until you get 5-10 in your hand, then move coins from palm to fingertips to  place in container one at a time. Practice writing and/or typing.

## 2024-04-01 NOTE — Therapy (Signed)
 OUTPATIENT OCCUPATIONAL THERAPY NEURO Treatment  Patient Name: Kenneth [name] MRN: [id number] DOB:02/08/1961, 63 y.o., male Today's Date: 04/01/2024  PCP: Dr. Therese [name] REFERRING PROVIDER: Dr. Alessandra [name]  END OF SESSION:  OT End of Session - 04/01/24 1236     Visit Number 5    Number of Visits 12    Authorization Type [employer]    OT Start Time 1230    OT Stop Time 1313    OT Time Calculation (min) 43 min    Behavior During Therapy [facility] for tasks assessed/performed               Past Medical History:  Diagnosis Date   Alcohol dependence (HCC)    Chronic calcific pancreatitis (HCC)    CVA (cerebral vascular accident) (HCC) 01/24/2022   Diabetes mellitus without complication (HCC)    Hyperlipidemia    Hypertension    Melanoma (HCC) 2013   metastatic - resected and cured with chemo/immuno tx   Pancreatic cyst-mass?    Polyneuropathy    Past Surgical History:  Procedure Laterality Date   BIOPSY  02/03/2022   Procedure: BIOPSY;  Surgeon: Kenneth Peacemaker, MD;  Location: [facility] ENDOSCOPY;  Service: Gastroenterology;;   BRAIN SURGERY  2015   to check for possible malignancy from melanoma, result were scar tissue   COLONOSCOPY WITH PROPOFOL  N/A 02/03/2022   Procedure: COLONOSCOPY WITH PROPOFOL ;  Surgeon: Kenneth Peacemaker, MD;  Location: [facility] ENDOSCOPY;  Service: Gastroenterology;  Laterality: N/A;   ESOPHAGOGASTRODUODENOSCOPY (EGD) WITH PROPOFOL  N/A 02/03/2022   Procedure: ESOPHAGOGASTRODUODENOSCOPY (EGD) WITH PROPOFOL ;  Surgeon: Kenneth Peacemaker, MD;  Location: [facility] ENDOSCOPY;  Service: Gastroenterology;  Laterality: N/A;   melanoma exicison     right leg   Patient Active Problem List   Diagnosis Date Noted   Onychomycosis 02/26/2024   CKD stage G2/A2, GFR 60-89 and albumin creatinine ratio 30-299 mg/g 02/13/2024   Stroke (cerebrum) (HCC) 02/08/2024   Acute focal neurological deficit 02/04/2024   Peripheral neuropathy 08/22/2023   Osteoarthritis of left knee 08/22/2023   BPV  (benign positional vertigo) 02/06/2023   Seborrhea 11/07/2022   History of melanoma 04/12/2022   Alcohol dependence in remission (HCC) 04/12/2022   Aortic atherosclerosis (HCC) 04/12/2022   Alcohol-induced chronic pancreatitis (HCC) 03/15/2022   History of CVA (cerebrovascular accident) 02/28/2022   Gastritis and gastroduodenitis    DNR (do not resuscitate) 02/01/2022   Vertigo 01/24/2022   Elevated PSA 11/24/2021   History of immunotherapy 11/25/2019   Melanoma metastatic to brain (HCC) 08/08/2016   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HCC) 08/13/2014   Metastatic melanoma to lung (HCC) 12/12/2012   Type 2 diabetes mellitus (HCC) 08/20/2007   Dyslipidemia 08/20/2007   Essential hypertension 08/19/2007    ONSET DATE: referral 02/14/24  REFERRING DIAG: CVA  THERAPY DIAG:  Muscle weakness (generalized)  Unsteadiness on feet  Other disturbances of skin sensation  Other lack of coordination  Rationale for Evaluation and Treatment: Rehabilitation  SUBJECTIVE:   SUBJECTIVE STATEMENT: Pt reports his arms are tired after arm bike Pt accompanied by: self  PERTINENT HISTORY: Kenneth [name] is a 63 y.o. male who presented to [facility] ED with c/o slurred speech, facial droop, and R arm tingling. MRI on 02/04/2024 revealed Two adjacent acute infarcts within the left corona radiata, left lentiform nucleus and posterior limb of left internal capsule. PMHx includes alcohol dependence, chronic calcific pancreatitis, CVA, DM, HLD, HTN, melanoma, polyneuropathy  PRECAUTIONS: Fall, thick liquids- on water protocol, if teeth have been brushed  WEIGHT BEARING  RESTRICTIONS: No  PAIN:  Are you having pain? No  FALLS: Has patient fallen in last 6 months? No  LIVING ENVIRONMENT: Lives with: lives with their family Lives in: House/apartment Stairs: Yes: External: 4 steps;  Has following equipment at home: Single point cane, Walker - 2 wheeled, and shower chair  PLOF: Independent  PATIENT GOALS: get back  to where I was before  OBJECTIVE:  Note: Objective measures were completed at Evaluation unless otherwise noted.  HAND DOMINANCE: Right  ADLs: Overall ADLs: mod I with basic ADLS Transfers/ambulation related to ADLs: Eating: using LUE predominantly for eating, pt did htis before CVA, difficulty with cutting Grooming: currently using non dominant LUE UB Dressing: mod I LB Dressing: mod I Toileting: mod I Bathing: mod I Tub Shower transfers: mod I Equipment: Shower seat without back, grab bar   IADLs: Shopping: dependent Light housekeeping: dependent Meal Prep: has not attempted , previously microwave Community mobility: mod I with rollator Medication management: sister assists Financial management: sister assists Handwriting: 90% legible, with increased time  MOBILITY STATUS:  mod I with rollator     FUNCTIONAL OUTCOME MEASURES: Quick Dash: 52.5 % disability  UPPER EXTREMITY ROM:  LUE WFLS  Active ROM Right eval Left eval  Shoulder flexion 95   Shoulder abduction    Shoulder adduction    Shoulder extension    Shoulder internal rotation    Shoulder external rotation    Elbow flexion WFL   Elbow extension WFLs   Wrist flexion    Wrist extension    Wrist ulnar deviation    Wrist radial deviation    Wrist pronation WFLs   Wrist supination 90%   (Blank rows = not tested)  UPPER EXTREMITY MMT:     MMT Right eval Left eval  Shoulder flexion 3/5 4/5  Shoulder abduction    Shoulder adduction    Shoulder extension    Shoulder internal rotation    Shoulder external rotation    Middle trapezius    Lower trapezius    Elbow flexion 4/5 4+/5  Elbow extension 4/5 4+/5  Wrist flexion    Wrist extension    Wrist ulnar deviation    Wrist radial deviation    Wrist pronation    Wrist supination    (Blank rows = not tested)  HAND FUNCTION: Grip strength: Right: 26 lbs; Left: 30 lbs  COORDINATION: 9 Hole Peg test: Right: 69.13 sec; Left: 37.64 sec Box and  Blocks:  Right 32blocks, Left 43blocks  SENSATION: Light touch: Impaired RUE    COGNITION: Overall cognitive status: Within functional limits for tasks assessed    VISION ASSESSMENT: Not tested- denies changes      OBSERVATIONS: Pleasant gentleman motivated to improve                                                                                                                             TREATMENT DATE: 04/01/24 UBE x 6 mins level 1  for conditioning. Coordination HEP issued for RUE, pt returned demonstration following education.  03/27/24- Reviewed cane exercises for shoulder flexion, chest press and abduction, min v.c  for shoulder positioning, 10 reps  each with mirror in front for feedback Pt practiced cutting food with foam grip on knife in right hand, and holding fork with left hand, min v.c.  Pt was able to sucessfully cut several pieces with repetition/ practice. Fine motor coordination activity  with cognitive component  to copy small peg design with RUE min difficulty/ v.c Removing pegs with in hand manipulation, min difficulty UBE x 5 mins level 1 for conditioning  03/19/24-Reviewed cane exercises for shoulder flexion, chest press and abduction, min v.c  10-15 reps each then coordination HEP issued, see pt instructions, min v.c Red putty exercises for sustained grip and pinch, min v.c- pt was issued putty at the hospital.  03/12/24- Flipping and dealing playing cards with RUE, rotating ball in hand for increased fine motor coordination, min difficulty/ v.c Placing metal pegs 3 sizes into pegboard with RUE then removing with in hand manipulation, min difficulty v.c  Cane exercises for shoulder flexion, abduction and chest press 15 reps each., min v.c  03/03/24- eval         PATIENT EDUCATION: Education details: coordination HEP, exercise flowsheet Person educated: Patient Education method: Explanation, demonstration, handout Education comprehension: verbalized  understanding, returned demonstraiton, v.c and demonstration   HOME EXERCISE PROGRAM: cane exercises , coordination exercises, red putty   GOALS: Goals reviewed with patient? Yes  SHORT TERM GOALS: Target date: 04/02/24  I with HEP Baseline:dependent Goal status:  met, for cane exerceises   2.  Pt will demonstrate improved RUE functional use as evidenced by increasing box/ blocks score by 4 blocks. Baseline: RUE 32 blocks, LUE 43 blocks Goal status: ongoing 34 blocks 04/01/24  3.  Pt will demonstrate improved fine motor coordiantion for ADLs as evidenced by decreasing 9 hole peg test score to 64 secs or less Baseline: RUE 69.13, LUE 37.64 Goal status: ongoing 67.08 secs  4.  Pt will increase RUE grip strength to 30 lbs or greater for increased functional use. Baseline: RUE 26 lbs, LUE 30 lbs Goal status: met 35, 04/01/24  5.  I with sensory precautions Baseline: dependent Goal status:met, pt verbalizes understanding 03/27/24  6.  Pt will use his RUE to brush his teeth at least 75% of the time Baseline: uses LUE Goal status: ongoing, pt is trying to use RUE 50%, 04/01/24  LONG TERM GOALS: Target date: 05/26/24  I with updated HEP Baseline: dependent Goal status:ongoing  2.  Pt will perform basic home management  modified independently Baseline: dependent Goal status: INITIAL  3.  Pt will demonstrate improved fine motor coordiantion for ADLs as evidenced by decreasing 9 hole peg test score to 56 secs or less Baseline: RUE 69.13, LUE 37.64 Goal status:  ongoing- 67.58   4.  Pt will improve Quick Dash score to 45% or better disability Baseline: 52.5% Goal status: ongoing  5.  Pt will report increased ease cutting food with RUE Baseline: pt reports difficulty cutting food with RUE Goal status: ongoing  6.  Pt will write a sentence with 100% legibility using RUE. Baseline: 90% legibile Goal status: ongoing  ASSESSMENT:  CLINICAL IMPRESSION: Pt is progressing  towards goals. He did not fully meet all short term goals due to sensory deficits. Pt has cane, putty and coordination exercises to work on at home. He is taking a 2 week  break from therapies to conserve MCD visits.  PERFORMANCE DEFICITS: in functional skills including ADLs, IADLs, coordination, dexterity, sensation, ROM, strength, pain, flexibility, Fine motor control, Gross motor control, mobility, balance, endurance, decreased knowledge of precautions, decreased knowledge of use of DME, and UE functional use, , and psychosocial skills including coping strategies, environmental adaptation, habits, interpersonal interactions, and routines and behaviors.   IMPAIRMENTS: are limiting patient from ADLs, IADLs, rest and sleep, work, play, leisure, and social participation.   CO-MORBIDITIES: may have co-morbidities  that affects occupational performance. Patient will benefit from skilled OT to address above impairments and improve overall function.  MODIFICATION OR ASSISTANCE TO COMPLETE EVALUATION: No modification of tasks or assist necessary to complete an evaluation.  OT OCCUPATIONAL PROFILE AND HISTORY: Detailed assessment: Review of records and additional review of physical, cognitive, psychosocial history related to current functional performance.  CLINICAL DECISION MAKING: LOW - limited treatment options, no task modification necessary  REHAB POTENTIAL: Good  EVALUATION COMPLEXITY: Low    PLAN:  OT FREQUENCY: 1x/week  OT DURATION: 12 weeks, anticipate wrapping up after 6-8 visits due to insurance limitations, and based upon progress.  PLANNED INTERVENTIONS: 97168 OT Re-evaluation, 97535 self care/ADL training, 16109 therapeutic exercise, 97530 therapeutic activity, 97112 neuromuscular re-education, 97140 manual therapy, 97035 ultrasound, 97018 paraffin, 60454 moist heat, 97010 cryotherapy, 97014 electrical stimulation unattended, passive range of motion, functional mobility training,  energy conservation, coping strategies training, patient/family education, and DME and/or AE instructions  RECOMMENDED OTHER SERVICES: PT, ST  CONSULTED AND AGREED WITH PLAN OF CARE: Patient  PLAN FOR NEXT SESSION: place therapy on hold for 2 weeks then continue to work for 2 additional visits    Anthonio Mizzell, OT 04/01/2024, 1:25 PM

## 2024-04-02 NOTE — Therapy (Signed)
 OUTPATIENT SPEECH LANGUAGE PATHOLOGY TREATMENT  Patient Name: Kenneth [name] MRN: [id number] DOB:[birth year], 63 y.o., male Today's Date: 04/02/2024  PCP: Graig Lawyer, MD REFERRING PROVIDER: Graig Lawyer, MD  END OF SESSION:  End of Session - 04/01/24 1216     Visit Number 6    Number of Visits 8    Date for SLP Re-Evaluation 04/27/24    SLP Start Time 1100    SLP Stop Time  1140    SLP Time Calculation (min) 40 min    Activity Tolerance Patient tolerated treatment well             Past Medical History:  Diagnosis Date   Alcohol dependence (HCC)    Chronic calcific pancreatitis (HCC)    CVA (cerebral vascular accident) (HCC) 01/24/2022   Diabetes mellitus without complication (HCC)    Hyperlipidemia    Hypertension    Melanoma (HCC) 2013   metastatic - resected and cured with chemo/immuno tx   Pancreatic cyst-mass?    Polyneuropathy    Past Surgical History:  Procedure Laterality Date   BIOPSY  02/03/2022   Procedure: BIOPSY;  Surgeon: [name], MD;  Location: [facility] ENDOSCOPY;  Service: Gastroenterology;;   BRAIN SURGERY  2015   to check for possible malignancy from melanoma, result were scar tissue   COLONOSCOPY WITH PROPOFOL  N/A 02/03/2022   Procedure: COLONOSCOPY WITH PROPOFOL ;  Surgeon: [name], MD;  Location: [facility] ENDOSCOPY;  Service: Gastroenterology;  Laterality: N/A;   ESOPHAGOGASTRODUODENOSCOPY (EGD) WITH PROPOFOL  N/A 02/03/2022   Procedure: ESOPHAGOGASTRODUODENOSCOPY (EGD) WITH PROPOFOL ;  Surgeon: [name], MD;  Location: [facility] ENDOSCOPY;  Service: Gastroenterology;  Laterality: N/A;   melanoma exicison     right leg   Patient Active Problem List   Diagnosis Date Noted   Onychomycosis 02/26/2024   CKD stage G2/A2, GFR 60-89 and albumin creatinine ratio 30-299 mg/g 02/13/2024   Stroke (cerebrum) (HCC) 02/08/2024   Acute focal neurological deficit 02/04/2024   Peripheral neuropathy 08/22/2023   Osteoarthritis of left knee  08/22/2023   BPV (benign positional vertigo) 02/06/2023   Seborrhea 11/07/2022   History of melanoma 04/12/2022   Alcohol dependence in remission (HCC) 04/12/2022   Aortic atherosclerosis (HCC) 04/12/2022   Alcohol-induced chronic pancreatitis (HCC) 03/15/2022   History of CVA (cerebrovascular accident) 02/28/2022   Gastritis and gastroduodenitis    DNR (do not resuscitate) 02/01/2022   Vertigo 01/24/2022   Elevated PSA 11/24/2021   History of immunotherapy 11/25/2019   Melanoma metastatic to brain (HCC) 08/08/2016   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HCC) 08/13/2014   Metastatic melanoma to lung (HCC) 12/12/2012   Type 2 diabetes mellitus (HCC) 08/20/2007   Dyslipidemia 08/20/2007   Essential hypertension 08/19/2007    ONSET DATE: 02/08/24   REFERRING DIAG:  Free Text Diagnosis  CVA    THERAPY DIAG:  Oropharyngeal dysphagia  Rationale for Evaluation and Treatment: Rehabilitation  SUBJECTIVE:   SUBJECTIVE STATEMENT: Pt reports he had to repeat himself x1 to a friend  Pt accompanied by: self, sisters are bringing to therapy  PERTINENT HISTORY: Per chart review: 63 y.o. right-handed male with history of HTN, CVA, recent fall due to RLE weakness, EtOH abuse in the past, T2DM, polyneuropathy, melanoma with metastasis to the brain and lung (in remission)   PAIN:  Are you having pain? No  FALLS: Has patient fallen in last 6 months?  No  LIVING ENVIRONMENT: Lives with:  sister Aisha Hove) Lives in: House/apartment  PLOF:  Level of assistance: Independent with  ADLs, Independent with IADLs Employment: Other: working on disability. Out of work since CVA in 2023. 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   PATIENT GOALS: speech, swallowing  OBJECTIVE:  Note: Objective measures were completed at Evaluation unless otherwise noted.  DIAGNOSTIC FINDINGS: See EMR for full report.     COGNITION: Overall cognitive status: Within functional limits for tasks assessed;  pt reports no changes. Assessment in CIR deemed cognition WFL.  Areas of impairment:  NA Functional deficits: NA   MOTOR SPEECH: Overall motor speech: impaired Level of impairment: Phrase, Sentence, and Conversation Respiration:  WFL Phonation: normal Resonance: WFL Articulation: Impaired: phrase, sentence, and conversation Intelligibility: Intelligibility reduced Motor planning: Appears intact Motor speech errors:  NA Interfering components:  NA Effective technique: slow rate, increased vocal intensity, and over articulate  ORAL MOTOR EXAMINATION: Overall status: Impaired:   Labial: Right (Symmetry and Strength) Lingual: Right (Symmetry and Strength) Facial: Right (Symmetry and Strength) Velum: ROM Cough: WFL Comments:   RECOMMENDATIONS FROM OBJECTIVE SWALLOW STUDY (MBSS/FEES):    Modified Barium Swallow 02/12/24  Recommendations/Plan: Swallowing Evaluation Recommendations Swallowing Evaluation Recommendations Recommendations: PO diet PO Diet Recommendation: Regular; Mildly thick liquids (Level 2, nectar thick) Liquid Administration via: Spoon; Cup; No straw Medication Administration: Whole meds with puree Supervision: Patient able to self-feed; Intermittent supervision/cueing for swallowing strategies Swallowing strategies  : Minimize environmental distractions; Slow rate; Small bites/sips Postural changes: Position pt fully upright for meals Oral care recommendations: Oral care BID (2x/day) Caregiver Recommendations: Avoid jello, ice cream, thin soups, popsicles; Remove water pitcher  Modified Barium Swallow 02/05/24  Recommendations/Plan: Swallowing Evaluation Recommendations Swallowing Evaluation Recommendations Recommendations: PO diet PO Diet Recommendation: Dysphagia 3 (Mechanical soft); Moderately thick liquids (Level 3, honey thick) Liquid Administration via: Spoon; Cup Medication Administration: Whole meds with puree Supervision: Patient able to  self-feed; Intermittent supervision/cueing for swallowing strategies Swallowing strategies  : Minimize environmental distractions; Slow rate; Small bites/sips Postural changes: Position pt fully upright for meals Oral care recommendations: Oral care BID (2x/day) Caregiver Recommendations: Avoid jello, ice cream, thin soups, popsicles; Remove water pitcher     CLINICAL SWALLOW ASSESSMENT:   Current diet: regular and nectar thick liquids Dentition: adequate natural dentition Patient directly observed with POs: No Feeding: able to feed self Liquids provided by:  Not tested; no straws Oral phase signs and symptoms:  Not tested Pharyngeal phase signs and symptoms:  Not tested Comments: To be assessed next session.    PATIENT REPORTED OUTCOME MEASURES (PROM): EAT-10: 14                                                                                                                            TREATMENT DATE:   04/01/24: Pt was seen for skilled ST services targeting dysphagia. Pt was able to perform all pharyngeal exercises independently. SLP suggested placing patient on "hold" due to insurance visit limit until after the MBS is performed. Pt in agreement. SLP has reached out to  PCP for referral. Once received, SLP to reach out to pt to have him schedule.    03/27/24: Pt was seen for skilled ST services targeting dysphagia. Pt reports he hasn't noticed any instances of becoming strangled with water (or any other solid/thickened liquid). He is currently completing swallowing exercises x1/day, with the effortful swallow throughout the day.   CTAR: x20 - 5 second hold  Masako x20  - required extra time to complete; became to initiate swallow in the final 10.   Effortful Swallow x10  -indep completed  Supraglottic Swallow x6 - x4 overt s/sx of aspiration (immediate cough). SLP suspects muscle fatigue due to completion of exercises prior. Discontinued these exercises with water.   Speech was  100% intelligible in quiet area with familiar listener. Pt reports he knows what strategies to use when he needs them.   03/19/24: Pt was seen for skilled ST services targeting dysphagia and dysarthria. Pt reported he went to steakhouse for his birthday and everything "went down good". He reports he brought his nectar thick liquid packet with him to restaurant and requested no ice. He has been completing exercises at home x1/day. Pt reports he did not practice his functional phrases, but he reports he tried to "be clear" when speaking to his sister. He reports he has not had repeat himself very often - "it is tougher (to speak) in the evening". Pt and SLP reviewed functional strategies from last session. Pt read functional phrases with no speech errors. Pt does not have any questions about speech intelligibility strategies and strategies learned in CIR ("SLOP").    Pt completed oral care and SLP facilitated FFWP while completing effortful swallow exercise. SLP encouraged pt to complete oral care at least 3x a day - after meals and ALWAYS before water. Pt was unable to complete with max cueing. To continue to trial. SLP introduced supraglottic swallow. Pt was able to complete with min cueing. To continue to practice use while drinking water at home.     Pt reports biting R cheek while eating - SLP suggested trying to masticate on opposite side, as well as taking smaller bites.   03/12/24: Pt was seen for skilled ST services targeting dysphagia and dysarthria. Pt forgot toothbrush, so unable to do liquid trials (FFWP) this session. Pt reports he has been completing all swallowing exercises once a day. Pt reports speech has been "alright", but that he has to repeat himself throughout day ~5x. SLP educated on "Be Clear" strategies. SLP assisted pt in developing functional phrases to practice throughout the day. SLP educated on when he would be most likely to use strategies re: drive thru, fatigued, phone. Pt to  continue practice at home.    03/03/24: Pt was seen for skilled ST services targeting dysphagia. Of note, pt reports his speech is 80% back to baseline at this time - more slurred when tired. When prompted, pt reports he has only completed exercises twice since last being seen - SLP encouraged to complete everyday. SLP suggested creating a routine to ensure exercises completion.   Completed the following exercises: CTAR: x10  - 5 second hold  Masako x10  - required extra time to complete; difficult to initiate. Trialed tongue at different places.   Effortful Swallow  -Visibly, SLP did not see any increased effort - though, pt reported it felt different. To continue practice.   Pt completed oral care prior to water trials today. SLP observed immediate and delayed coughs after every sip of water.  SLP reviewed Consuella Denis Protocol and the importance of completing oral care prior to water trials. SLP observed pt with regular and NTL trial. Pt was able to masticate effectively. No residue observed after liquid wash. No solids were left in buccal cavity. Pt performed a finger sweep to check for pocketing due to decreased sensation. Safe to continue regular and NTL diet.      PATIENT EDUCATION: Education details: SLP role  Person educated: Patient Education method: Chief Technology Officer Education comprehension: needs further education   GOALS: Goals reviewed with patient? Yes  SHORT TERM GOALS: Target date: 03/28/24  Pt will demonstrate use of pharyngeal strengthening exercises independently. Baseline: Goal status: MET  2.  Pt will recall safe swallow strategies independently Baseline:  Goal status: MET  3.  Pt will recall speech intelligibility compensations with minA verbal cues Baseline:  Goal status: MET    LONG TERM GOALS: Target date: 04/27/24  Pt will improve score on PROMs Baseline: EAT-10: 14 Goal status: INITIAL  2.  Pt will utilize speech intelligibility  compensations in conversation independently. Baseline:  Goal status: INITIAL  3.  Pt will recall 3 environmental modifications for dysarthria Baseline:  Goal status: INITIAL    ASSESSMENT:  CLINICAL IMPRESSION: Pt is a 63 yo male who presents to ST OP for evaluation post CVA. See tx note. Pt is to be placed on hold until MBSS is performed due to insurance visit limit.  SLP rec skilled ST services to address dysarthria and dysphagia to maximize functional communication and increased safety with swallowing respectively.     OBJECTIVE IMPAIRMENTS: include dysarthria and dysphagia. These impairments are limiting patient from effectively communicating at home and in community and safety when swallowing. Factors affecting potential to achieve goals and functional outcome are  NA . Patient will benefit from skilled SLP services to address above impairments and improve overall function.  REHAB POTENTIAL: Good; limited by insurance  PLAN:  SLP FREQUENCY: 1-2x/week  SLP DURATION: 8 weeks  PLANNED INTERVENTIONS: Aspiration precaution training, Pharyngeal strengthening exercises, Diet toleration management , Environmental controls, Trials of upgraded texture/liquids, Cueing hierachy, Functional tasks, SLP instruction and feedback, Compensatory strategies, Patient/family education, [phone] Treatment of speech (30 or 45 min) , and [id number] Treatment of swallowing function    [employer], CCC-SLP 04/02/2024, 12:18 PM

## 2024-04-06 ENCOUNTER — Other Ambulatory Visit: Payer: Self-pay | Admitting: Family Medicine

## 2024-04-08 ENCOUNTER — Ambulatory Visit: Admitting: Occupational Therapy

## 2024-04-08 ENCOUNTER — Ambulatory Visit: Admitting: Speech Pathology

## 2024-04-08 ENCOUNTER — Ambulatory Visit

## 2024-04-12 ENCOUNTER — Other Ambulatory Visit: Payer: Self-pay | Admitting: Family Medicine

## 2024-04-12 DIAGNOSIS — R627 Adult failure to thrive: Secondary | ICD-10-CM

## 2024-04-14 ENCOUNTER — Other Ambulatory Visit: Payer: Self-pay | Admitting: Family Medicine

## 2024-04-14 DIAGNOSIS — I639 Cerebral infarction, unspecified: Secondary | ICD-10-CM

## 2024-04-15 ENCOUNTER — Other Ambulatory Visit (HOSPITAL_COMMUNITY): Payer: Self-pay | Admitting: *Deleted

## 2024-04-15 DIAGNOSIS — R1310 Dysphagia, unspecified: Secondary | ICD-10-CM

## 2024-04-16 ENCOUNTER — Ambulatory Visit: Admitting: Occupational Therapy

## 2024-04-16 ENCOUNTER — Ambulatory Visit

## 2024-04-16 ENCOUNTER — Ambulatory Visit: Admitting: Speech Pathology

## 2024-04-21 NOTE — Therapy (Signed)
 OUTPATIENT PHYSICAL THERAPY LOWER EXTREMITY TREATMENT   Patient Name: Kenneth [name] MRN: [id number] DOB:02/23/1961, 63 y.o., male Today's Date: 04/22/2024  END OF SESSION:  PT End of Session - 04/22/24 1146     Visit Number 6    Number of Visits 13    Date for PT Re-Evaluation 05/27/24    Authorization Type [employer] MCD    PT Start Time 1145    PT Stop Time 1230    PT Time Calculation (min) 45 min    Equipment Utilized During Treatment Gait belt    Activity Tolerance Patient tolerated treatment well    Behavior During Therapy WFL for tasks assessed/performed                  Past Medical History:  Diagnosis Date   Alcohol dependence (HCC)    Chronic calcific pancreatitis (HCC)    CVA (cerebral vascular accident) (HCC) 01/24/2022   Diabetes mellitus without complication (HCC)    Hyperlipidemia    Hypertension    Melanoma (HCC) 2013   metastatic - resected and cured with chemo/immuno tx   Pancreatic cyst-mass?    Polyneuropathy    Past Surgical History:  Procedure Laterality Date   BIOPSY  02/03/2022   Procedure: BIOPSY;  Surgeon: [name], MD;  Location: [facility] ENDOSCOPY;  Service: Gastroenterology;;   BRAIN SURGERY  2015   to check for possible malignancy from melanoma, result were scar tissue   COLONOSCOPY WITH PROPOFOL  N/A 02/03/2022   Procedure: COLONOSCOPY WITH PROPOFOL ;  Surgeon: [name], MD;  Location: [facility] ENDOSCOPY;  Service: Gastroenterology;  Laterality: N/A;   ESOPHAGOGASTRODUODENOSCOPY (EGD) WITH PROPOFOL  N/A 02/03/2022   Procedure: ESOPHAGOGASTRODUODENOSCOPY (EGD) WITH PROPOFOL ;  Surgeon: [name], MD;  Location: [facility] ENDOSCOPY;  Service: Gastroenterology;  Laterality: N/A;   melanoma exicison     right leg   Patient Active Problem List   Diagnosis Date Noted   Onychomycosis 02/26/2024   CKD stage G2/A2, GFR 60-89 and albumin creatinine ratio 30-299 mg/g 02/13/2024   Stroke (cerebrum) (HCC) 02/08/2024   Acute focal neurological  deficit 02/04/2024   Peripheral neuropathy 08/22/2023   Osteoarthritis of left knee 08/22/2023   BPV (benign positional vertigo) 02/06/2023   Seborrhea 11/07/2022   History of melanoma 04/12/2022   Alcohol dependence in remission (HCC) 04/12/2022   Aortic atherosclerosis (HCC) 04/12/2022   Alcohol-induced chronic pancreatitis (HCC) 03/15/2022   History of CVA (cerebrovascular accident) 02/28/2022   Gastritis and gastroduodenitis    DNR (do not resuscitate) 02/01/2022   Vertigo 01/24/2022   Elevated PSA 11/24/2021   History of immunotherapy 11/25/2019   Melanoma metastatic to brain (HCC) 08/08/2016   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HCC) 08/13/2014   Metastatic melanoma to lung (HCC) 12/12/2012   Type 2 diabetes mellitus (HCC) 08/20/2007   Dyslipidemia 08/20/2007   Essential hypertension 08/19/2007    PCP: Graig Lawyer, MD  REFERRING PROVIDER: Alessandra Ancona, MD  REFERRING DIAG: Diagnosis R29.898 (ICD-10-CM) - Right leg weakness Z86.73 (ICD-10-CM) - History of CVA (cerebrovascular accident)  THERAPY DIAG:  Muscle weakness (generalized)  Unsteadiness on feet  Other disturbances of skin sensation  Other lack of coordination  Difficulty in walking, not elsewhere classified  Rationale for Evaluation and Treatment: Rehabilitation  ONSET DATE: 02/04/24  SUBJECTIVE:   SUBJECTIVE STATEMENT:  Swallow study scheduled for Friday. No falls or anything.    Patient was being seen here for leg pain and weakness in February.  He was admitted to the hospital on 02/04/24, was discharged on  02/16/24.   Kenneth [name] is a 63 y.o. male who presented to [facility] ED with c/o slurred speech, facial droop, and R arm tingling. MRI on 02/04/2024 revealed Two adjacent acute infarcts within the left corona radiata, left lentiform nucleus and posterior limb of left internal capsule. PMHx includes alcohol dependence, chronic calcific pancreatitis, CVA, DM, HLD, HTN, melanoma, polyneuropathy   PERTINENT HISTORY: See  above  PAIN:  Are you having pain? No 0/10 now   PRECAUTIONS: Fall  RED FLAGS: None   WEIGHT BEARING RESTRICTIONS: No  FALLS:  Has patient fallen in last 6 months? Yes, 1 LIVING ENVIRONMENT: Lives with: lives with their family Lives in: House/apartment- townhouse  Stairs: No Has following equipment at home: Environmental consultant - 2 wheeled and Wheelchair (manual)  OCCUPATION: not working   PLOF: Independent with gait, Independent with transfers, and Requires assistive device for independence  PATIENT GOALS: to be able to get around on my own completely, be more independent   NEXT MD VISIT: [place] tomorrow, neurologist April 28th  OBJECTIVE:  Note: Objective measures were completed at Evaluation unless otherwise noted.  DIAGNOSTIC FINDINGS:   02/04/24 IMPRESSION: 1. Two adjacent acute infarcts within the left corona radiata, left lentiform nucleus and posterior limb of left internal capsule (individually measuring up to 2 cm). 2. Moderate-sized focus of chronic cortical encephalomalacia/gliosis within the anterolateral left frontal lobe (deep to a cranioplasty). 3. Background parenchymal atrophy, chronic small vessel ischemic disease and chronic lacunar infarcts, as described. 4. Several chronic microhemorrhages in a distribution suggesting sequelae of hypertensive microangiopathy. 5. Mild inflammatory left maxillary sinus disease. COGNITION: Overall cognitive status: Within functional limits for tasks assessed, slurred speech     SENSATION: Not tested hx of neuropathy   COORDINATION  Impaired coordination R LE, facial droop right, slurred speech and difficulty blinking right eye   LOWER EXTREMITY MMT:  MMT Right eval Left eval  Hip flexion 3+ 4+  Hip extension    Hip abduction 4- 5  Hip adduction    Hip internal rotation    Hip external rotation    Knee flexion 4- 5  Knee extension 4- 5  Ankle dorsiflexion 4- 4-  Ankle plantarflexion    Ankle inversion    Ankle  eversion     (Blank rows = not tested)   FUNCTIONAL TESTS:  TUG: 26 seconds with 4WW 5 times sit to stand: 25 seconds use of BUEs on chair, posterior unsteadiness/poor eccentric control  Berg: 24/56 3 minute walk test: 350 feet with 4WW, c/o fatigue and the right foot started to drag  GAIT: Distance walked: 342ft Assistive device utilized: 4WW Level of assistance: SBA Comments:  limited WB RLE, limited ankle DF and knee ROM during gait cycle , mild toe drag when fatigued  TREATMENT DATE:  04/21/24 Recheck goals - 569ft w/RW  -TUG 14.41s -BERG 35/56 -MMT 5/5  NuStep L5x77mins  Box taps 6" by stairs 1HRA  STS 2x10 from chair height w/airex on top    04/01/24 NuStep L5x39mins  Walking with SPC, then without CGA  Side steps CGA  Walking forwards and backwards CGA STS 2x10 Step ups 4" 1HRA   Tandem stance in bars 15s at best    03/27/24 Nustep level 5 x 8 minutes 20# HS curls 5# leg extension 2x10 , then right leg only 2x5 On airex reaching for numbers alternating hands STS with weighted ball reach, this was tough for him needed cues for nose over toes Standing ball toss with him holding walker and then trying without but it in front of him Tandem stance blue foam 2x30 seconds Side steps on solid surface progressing to blue foam pad x3 rounds total Narrow BOS on blue foam with head turns  Gait with CGA and gait belt with SPC x 200 feet Then without device  03/19/24  Nustep L5x8 minutes seat 7   Standing regular BOS blue foam pad 3x30 seconds close S  Standing on foam with cross midline reaches to random numbers  Standing on blue foam pad with toe taps to targets (random call outs) U UE support min guard, random combinations   Standing marches 3# 2x10 B Standing HS curls 3# 2x10 B Standing hip ABD 3# 2x10 B Squats in // bars 2x10    03/14/24 NuStep L5x37mins  STS 2x10 LAQ 2# 2x10 Standing marches 2# 20 reps alt x2 HS curls green 2x10 Box taps 4" by stairs 1HRA, then x10 alt without  TUG- 23s   PATIENT EDUCATION:  Education details: exam, POC, HEP  Person educated: Patient Education method: Explanation, Demonstration, and Handouts Education comprehension: verbalized understanding, returned demonstration, and needs further education  HOME EXERCISE PROGRAM: Access Code: [email or web address] URL: [email or web address] Date: 02/25/2024 Prepared by: Cherylene Corrente  Exercises - Seated Long Arc Quad  - 2 x daily - 7 x weekly - 2 sets - 10 reps - 3 hold - Seated March  - 2 x daily - 7 x weekly - 2 sets - 10 reps - 3 hold - Seated Heel Toe Raises  - 2 x daily - 7 x weekly - 2 sets - 10 reps - 3 hold  ASSESSMENT:  CLINICAL IMPRESSION: Pt arrives today doing fine returns after a 2 week break to save visits. Rechecked goals, he has met some of them. His RLE drags with fatigue and he is unable to pick it up as high. We continued to work on strength and balance. Unable to do 6" box taps without assistance. Has 2 more PT visits scheduled and then would like to save the remaining for the rest of the year.    Patient is a 63 y.o. M who was seen today for physical therapy evaluation and treatment for s/p CVA, difficulty walking and weakness.   RLE is weaker than the L.  Of more concern is general balance and coordination- didn't score well on Berg and is definitely a high fall risk. TUG and 5XSTS put him at risk for falls as well, with standing balance he tended to be back on his heels and did lose balance twice on the Berg requiring mod A to correct.  Will benefit from skilled PT services to address all findings and assist in return to optimal level of function.   OBJECTIVE IMPAIRMENTS: Abnormal gait, decreased activity  tolerance, decreased balance, decreased knowledge of use of DME, decreased mobility, difficulty walking,  and decreased strength.   ACTIVITY LIMITATIONS: sitting, standing, squatting, stairs, transfers, and locomotion level  PARTICIPATION LIMITATIONS: driving, shopping, community activity, occupation, and yard work  PERSONAL FACTORS: Age, Fitness, Past/current experiences, Social background, and Time since onset of injury/illness/exacerbation are also affecting patient's functional outcome.   REHAB POTENTIAL: Good  CLINICAL DECISION MAKING: Stable/uncomplicated  EVALUATION COMPLEXITY: Low   GOALS: Goals reviewed with patient? No  SHORT TERM GOALS: Target date: 03/24/24   Will be compliant with appropriate progressive HEP  Baseline: Goal status: progressing 03/14/24  2.  Will score at least 35 on Berg to show improved balance  Baseline:  Goal status: ongoing 03/27/24, MET 04/22/24   3.  Will be able to name 3 ways to prevent fall at home and in the community  Baseline:  Goal status: no carpets, no loose rugs, has a lights in room so can see at night MET 03/14/24    LONG TERM GOALS: Target date: 05/27/24    MMT to have improved by one grade in all weak groups Baseline:  Goal status: MET 04/22/24  2.  Will score at least 42 on Berg to show reduced fall risk  Baseline: 24 Goal status: 34 ONGOING 04/22/24  3.  Will be able to perform all daily dressing/self care activities on an independent basis without LOB  Baseline:  Goal status: "I can do it on my own just standing there" 04/22/24  4.  Will be able to ambulate at least 572ft in with LRAD to show improved community access, fatigue no more than 3/10 Baseline:  Goal status: distance met with RW but fatigue at 5/10 ONGOING 04/22/24  5.  Decrease TUG time to 15 seconds Baseline:  Goal status: 23s 03/14/24, 14.41s MET 04/22/24     PLAN:  PT FREQUENCY: 1-2x/week  PT DURATION: 12 weeks  PLANNED INTERVENTIONS: 97164- PT Re-evaluation, 97110-Therapeutic exercises, 97530- Therapeutic activity, 97112- Neuromuscular  re-education, 97535- Self Care, 16109- Manual therapy, [phone]- Gait training, [phone]- Aquatic Therapy, Patient/Family education, Balance training, Stair training, and Moist heat  PLAN FOR NEXT SESSION: heavy focus on balance, strengthening as able, general conditioning. Safety training/education as needed   Cherylene Corrente, PT 04/22/24 12:31 PM   For all possible CPT codes, reference the Planned Interventions line above.     Check all conditions that are expected to impact treatment: {Conditions expected to impact treatment:Morbid obesity, Diabetes mellitus, Neurological condition and/or seizures, and Social determinants of health   If treatment provided at initial evaluation, no treatment charged due to lack of authorization.

## 2024-04-22 ENCOUNTER — Ambulatory Visit: Attending: Physical Medicine and Rehabilitation

## 2024-04-22 ENCOUNTER — Other Ambulatory Visit: Payer: Self-pay

## 2024-04-22 ENCOUNTER — Ambulatory Visit: Admitting: Speech Pathology

## 2024-04-22 ENCOUNTER — Ambulatory Visit: Admitting: Occupational Therapy

## 2024-04-22 ENCOUNTER — Encounter: Payer: Self-pay | Admitting: Occupational Therapy

## 2024-04-22 DIAGNOSIS — R278 Other lack of coordination: Secondary | ICD-10-CM | POA: Diagnosis present

## 2024-04-22 DIAGNOSIS — R208 Other disturbances of skin sensation: Secondary | ICD-10-CM | POA: Diagnosis present

## 2024-04-22 DIAGNOSIS — R2681 Unsteadiness on feet: Secondary | ICD-10-CM | POA: Insufficient documentation

## 2024-04-22 DIAGNOSIS — M6281 Muscle weakness (generalized): Secondary | ICD-10-CM

## 2024-04-22 DIAGNOSIS — R1312 Dysphagia, oropharyngeal phase: Secondary | ICD-10-CM | POA: Insufficient documentation

## 2024-04-22 DIAGNOSIS — R262 Difficulty in walking, not elsewhere classified: Secondary | ICD-10-CM | POA: Diagnosis present

## 2024-04-22 NOTE — Therapy (Signed)
 OUTPATIENT OCCUPATIONAL THERAPY NEURO Treatment  Patient Name: Kenneth [name] MRN: [id number] DOB:July 24, 1961, 63 y.o., male Today's Date: 04/22/2024  PCP: Dr. Therese [name] REFERRING PROVIDER: Dr. Alessandra [name]  END OF SESSION:  OT End of Session - 04/22/24 1226     Visit Number 6    Number of Visits 12    Date for OT Re-Evaluation 05/26/24    Authorization Type [employer]    OT Start Time 1230    OT Stop Time 1315    OT Time Calculation (min) 45 min    Behavior During Therapy [facility] for tasks assessed/performed               Past Medical History:  Diagnosis Date   Alcohol dependence (HCC)    Chronic calcific pancreatitis (HCC)    CVA (cerebral vascular accident) (HCC) 01/24/2022   Diabetes mellitus without complication (HCC)    Hyperlipidemia    Hypertension    Melanoma (HCC) 2013   metastatic - resected and cured with chemo/immuno tx   Pancreatic cyst-mass?    Polyneuropathy    Past Surgical History:  Procedure Laterality Date   BIOPSY  02/03/2022   Procedure: BIOPSY;  Surgeon: Kenneth Peacemaker, MD;  Location: [facility] ENDOSCOPY;  Service: Gastroenterology;;   BRAIN SURGERY  2015   to check for possible malignancy from melanoma, result were scar tissue   COLONOSCOPY WITH PROPOFOL  N/A 02/03/2022   Procedure: COLONOSCOPY WITH PROPOFOL ;  Surgeon: Kenneth Peacemaker, MD;  Location: [facility] ENDOSCOPY;  Service: Gastroenterology;  Laterality: N/A;   ESOPHAGOGASTRODUODENOSCOPY (EGD) WITH PROPOFOL  N/A 02/03/2022   Procedure: ESOPHAGOGASTRODUODENOSCOPY (EGD) WITH PROPOFOL ;  Surgeon: Kenneth Peacemaker, MD;  Location: [facility] ENDOSCOPY;  Service: Gastroenterology;  Laterality: N/A;   melanoma exicison     right leg   Patient Active Problem List   Diagnosis Date Noted   Onychomycosis 02/26/2024   CKD stage G2/A2, GFR 60-89 and albumin creatinine ratio 30-299 mg/g 02/13/2024   Stroke (cerebrum) (HCC) 02/08/2024   Acute focal neurological deficit 02/04/2024   Peripheral neuropathy 08/22/2023    Osteoarthritis of left knee 08/22/2023   BPV (benign positional vertigo) 02/06/2023   Seborrhea 11/07/2022   History of melanoma 04/12/2022   Alcohol dependence in remission (HCC) 04/12/2022   Aortic atherosclerosis (HCC) 04/12/2022   Alcohol-induced chronic pancreatitis (HCC) 03/15/2022   History of CVA (cerebrovascular accident) 02/28/2022   Gastritis and gastroduodenitis    DNR (do not resuscitate) 02/01/2022   Vertigo 01/24/2022   Elevated PSA 11/24/2021   History of immunotherapy 11/25/2019   Melanoma metastatic to brain (HCC) 08/08/2016   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HCC) 08/13/2014   Metastatic melanoma to lung (HCC) 12/12/2012   Type 2 diabetes mellitus (HCC) 08/20/2007   Dyslipidemia 08/20/2007   Essential hypertension 08/19/2007    ONSET DATE: referral 02/14/24  REFERRING DIAG: CVA  THERAPY DIAG:  No diagnosis found.  Rationale for Evaluation and Treatment: Rehabilitation  SUBJECTIVE:   SUBJECTIVE STATEMENT: Pt reports walking with cane at home Pt accompanied by: self  PERTINENT HISTORY: Kenneth [name] is a 63 y.o. male who presented to [facility] ED with c/o slurred speech, facial droop, and R arm tingling. MRI on 02/04/2024 revealed Two adjacent acute infarcts within the left corona radiata, left lentiform nucleus and posterior limb of left internal capsule. PMHx includes alcohol dependence, chronic calcific pancreatitis, CVA, DM, HLD, HTN, melanoma, polyneuropathy  PRECAUTIONS: Fall, thick liquids- on water protocol, if teeth have been brushed  WEIGHT BEARING RESTRICTIONS: No  PAIN:  Are you having pain?  No  FALLS: Has patient fallen in last 6 months? No  LIVING ENVIRONMENT: Lives with: lives with their family Lives in: House/apartment Stairs: Yes: External: 4 steps;  Has following equipment at home: Single point cane, Walker - 2 wheeled, and shower chair  PLOF: Independent  PATIENT GOALS: get back to where I was before  OBJECTIVE:  Note: Objective measures were  completed at Evaluation unless otherwise noted.  HAND DOMINANCE: Right  ADLs: Overall ADLs: mod I with basic ADLS Transfers/ambulation related to ADLs: Eating: using LUE predominantly for eating, pt did htis before CVA, difficulty with cutting Grooming: currently using non dominant LUE UB Dressing: mod I LB Dressing: mod I Toileting: mod I Bathing: mod I Tub Shower transfers: mod I Equipment: Shower seat without back, grab bar   IADLs: Shopping: dependent Light housekeeping: dependent Meal Prep: has not attempted , previously microwave Community mobility: mod I with rollator Medication management: sister assists Financial management: sister assists Handwriting: 90% legible, with increased time  MOBILITY STATUS: mod I with rollator     FUNCTIONAL OUTCOME MEASURES: Quick Dash: 52.5 % disability  UPPER EXTREMITY ROM:  LUE WFLS  Active ROM Right eval Left eval  Shoulder flexion 95   Shoulder abduction    Shoulder adduction    Shoulder extension    Shoulder internal rotation    Shoulder external rotation    Elbow flexion WFL   Elbow extension WFLs   Wrist flexion    Wrist extension    Wrist ulnar deviation    Wrist radial deviation    Wrist pronation WFLs   Wrist supination 90%   (Blank rows = not tested)  UPPER EXTREMITY MMT:     MMT Right eval Left eval  Shoulder flexion 3/5 4/5  Shoulder abduction    Shoulder adduction    Shoulder extension    Shoulder internal rotation    Shoulder external rotation    Middle trapezius    Lower trapezius    Elbow flexion 4/5 4+/5  Elbow extension 4/5 4+/5  Wrist flexion    Wrist extension    Wrist ulnar deviation    Wrist radial deviation    Wrist pronation    Wrist supination    (Blank rows = not tested)  HAND FUNCTION: Grip strength: Right: 26 lbs; Left: 30 lbs  COORDINATION: 9 Hole Peg test: Right: 69.13 sec; Left: 37.64 sec Box and Blocks:  Right 32blocks, Left 43blocks  SENSATION: Light touch:  Impaired RUE    COGNITION: Overall cognitive status: Within functional limits for tasks assessed    VISION ASSESSMENT: Not tested- denies changes      OBSERVATIONS: Pleasant gentleman motivated to improve                                                                                                                             TREATMENT DATE:04/22/24- Pt ambulated with cane Cane exercises for shoulder flexion, chest press, abduction, min v.c initally, improved positioning  and performance with repetition. Ambulating with cane while carrying a plate in RUE, without drops then carrying a glass of water in R hand, without drops. Min v.c to keep elbow and plate/ glass close to body for increased control. UE ranger for shoulder flexion in standing followed by circumduction, min facilitation/ v.c. Open chain functional reach to retrieve/ replace items from shoulder height level shelf, min difficulty/ v.c Pt with improved control. Writing activity to print the alphabet then print a sentence, min v.c for perfromance. Pt wrote a sentence in cursive with 80-90% accuracy, pt printed a sentence end of session with 100% legibility. Therapist started checking goals.    04/01/24 UBE x 6 mins level 1 for conditioning. Coordination HEP issued for RUE, pt returned demonstration following education.  03/27/24- Reviewed cane exercises for shoulder flexion, chest press and abduction, min v.c  for shoulder positioning, 10 reps  each with mirror in front for feedback Pt practiced cutting food with foam grip on knife in right hand, and holding fork with left hand, min v.c.  Pt was able to sucessfully cut several pieces with repetition/ practice. Fine motor coordination activity  with cognitive component to copy small peg design with RUE min difficulty/ v.c Removing pegs with in hand manipulation, min difficulty UBE x 5 mins level 1 for conditioning  03/19/24-Reviewed cane exercises for shoulder flexion,  chest press and abduction, min v.c  10-15 reps each then coordination HEP issued, see pt instructions, min v.c Red putty exercises for sustained grip and pinch, min v.c- pt was issued putty at the hospital.  03/12/24- Flipping and dealing playing cards with RUE, rotating ball in hand for increased fine motor coordination, min difficulty/ v.c Placing metal pegs 3 sizes into pegboard with RUE then removing with in hand manipulation, min difficulty v.c  Cane exercises for shoulder flexion, abduction and chest press 15 reps each., min v.c  03/03/24- eval         PATIENT EDUCATION: Education details: see above Person educated: Patient Education method: Explanation, demonstration, handout Education comprehension: verbalized understanding, returned demonstraiton, v.c and demonstration   HOME EXERCISE PROGRAM: cane exercises , coordination exercises, red putty   GOALS: Goals reviewed with patient? Yes  SHORT TERM GOALS: Target date: 04/02/24  I with HEP Baseline:dependent Goal status:  met, for cane exercises   2.  Pt will demonstrate improved RUE functional use as evidenced by increasing box/ blocks score by 4 blocks. Baseline: RUE 32 blocks, LUE 43 blocks Goal status: ongoing 34 blocks 04/01/24  3.  Pt will demonstrate improved fine motor coordiantion for ADLs as evidenced by decreasing 9 hole peg test score to 64 secs or less Baseline: RUE 69.13, LUE 37.64 Goal status:   ongoing 70 secs 04/22/24  4.  Pt will increase RUE grip strength to 30 lbs or greater for increased functional use. Baseline: RUE 26 lbs, LUE 30 lbs Goal status: met 35, 04/01/24  5.  I with sensory precautions Baseline: dependent Goal status:met, pt verbalizes understanding 03/27/24  6.  Pt will use his RUE to brush his teeth at least 75% of the time Baseline: uses LUE Goal status:met, 04/22/24, uses 75-90% of the time  LONG TERM GOALS: Target date: 05/26/24  I with updated HEP Baseline: dependent Goal  status:ongoing coordination HEP issued previously  04/22/20  2.  Pt will perform basic home management  modified independently Baseline: dependent Goal status:  ongoing 04/22/24  3.  Pt will demonstrate improved fine motor coordiantion for ADLs as evidenced  by decreasing 9 hole peg test score to 56 secs or less Baseline: RUE 69.13, LUE 37.64 Goal status:  ongoing- 67.58   4.  Pt will improve Quick Dash score to 45% or better disability Baseline: 52.5% Goal status: ongoing  5.  Pt will report increased ease cutting food with RUE Baseline: pt reports difficulty cutting food with RUE Goal status: met, 04/22/24  6.  Pt will write a sentence with 100% legibility using RUE. Baseline: 90% legibility Goal status: ongoing, grossly 80-90% for cursive, able to print 1 sentence legibily 04/22/24  ASSESSMENT:  CLINICAL IMPRESSION: Pt is progressing towards goals. He has achieved STG# 6 and long term goal #5 this week. He is now using RUE more for ADLs and consistently brushing teeth with RUE. Pt is in agreement with plans for d/c next visit/.   PERFORMANCE DEFICITS: in functional skills including ADLs, IADLs, coordination, dexterity, sensation, ROM, strength, pain, flexibility, Fine motor control, Gross motor control, mobility, balance, endurance, decreased knowledge of precautions, decreased knowledge of use of DME, and UE functional use, , and psychosocial skills including coping strategies, environmental adaptation, habits, interpersonal interactions, and routines and behaviors.   IMPAIRMENTS: are limiting patient from ADLs, IADLs, rest and sleep, work, play, leisure, and social participation.   CO-MORBIDITIES: may have co-morbidities  that affects occupational performance. Patient will benefit from skilled OT to address above impairments and improve overall function.  MODIFICATION OR ASSISTANCE TO COMPLETE EVALUATION: No modification of tasks or assist necessary to complete an evaluation.  OT  OCCUPATIONAL PROFILE AND HISTORY: Detailed assessment: Review of records and additional review of physical, cognitive, psychosocial history related to current functional performance.  CLINICAL DECISION MAKING: LOW - limited treatment options, no task modification necessary  REHAB POTENTIAL: Good  EVALUATION COMPLEXITY: Low    PLAN:  OT FREQUENCY: 1x/week  OT DURATION: 12 weeks, anticipate wrapping up after 6-8 visits due to insurance limitations, and based upon progress.  PLANNED INTERVENTIONS: 97168 OT Re-evaluation, 97535 self care/ADL training, 16109 therapeutic exercise, 97530 therapeutic activity, 97112 neuromuscular re-education, 97140 manual therapy, 97035 ultrasound, 97018 paraffin, 60454 moist heat, 97010 cryotherapy, 97014 electrical stimulation unattended, passive range of motion, functional mobility training, energy conservation, coping strategies training, patient/family education, and DME and/or AE instructions  RECOMMENDED OTHER SERVICES: PT, ST  CONSULTED AND AGREED WITH PLAN OF CARE: Patient  PLAN FOR NEXT SESSION:  check goals, d/c next visit   Geneva Barrero, OT 04/22/2024, 12:27 PM

## 2024-04-23 ENCOUNTER — Encounter: Payer: Self-pay | Admitting: Occupational Therapy

## 2024-04-25 ENCOUNTER — Ambulatory Visit (HOSPITAL_COMMUNITY)
Admission: RE | Admit: 2024-04-25 | Discharge: 2024-04-25 | Disposition: A | Discharge status: Home / Self Care | Source: Ambulatory Visit | Attending: Family Medicine | Admitting: Family Medicine

## 2024-04-25 DIAGNOSIS — R1312 Dysphagia, oropharyngeal phase: Secondary | ICD-10-CM | POA: Diagnosis not present

## 2024-04-25 DIAGNOSIS — R1310 Dysphagia, unspecified: Secondary | ICD-10-CM | POA: Diagnosis present

## 2024-04-25 DIAGNOSIS — I639 Cerebral infarction, unspecified: Secondary | ICD-10-CM

## 2024-04-25 NOTE — Therapy (Signed)
 OUTPATIENT PHYSICAL THERAPY LOWER EXTREMITY TREATMENT   Patient Name: Kenneth [name] MRN: [id number] DOB:01-05-1961, 63 y.o., male Today's Date: 04/29/2024  END OF SESSION:  PT End of Session - 04/29/24 1143     Visit Number 7    Number of Visits 13    Date for PT Re-Evaluation 05/27/24    Authorization Type [employer] MCD    PT Start Time 1145    PT Stop Time 1230    PT Time Calculation (min) 45 min    Equipment Utilized During Treatment Gait belt    Activity Tolerance Patient tolerated treatment well    Behavior During Therapy WFL for tasks assessed/performed                   Past Medical History:  Diagnosis Date   Alcohol dependence (HCC)    Chronic calcific pancreatitis (HCC)    CVA (cerebral vascular accident) (HCC) 01/24/2022   Diabetes mellitus without complication (HCC)    Hyperlipidemia    Hypertension    Melanoma (HCC) 2013   metastatic - resected and cured with chemo/immuno tx   Pancreatic cyst-mass?    Polyneuropathy    Past Surgical History:  Procedure Laterality Date   BIOPSY  02/03/2022   Procedure: BIOPSY;  Surgeon: [name], MD;  Location: [facility] ENDOSCOPY;  Service: Gastroenterology;;   BRAIN SURGERY  2015   to check for possible malignancy from melanoma, result were scar tissue   COLONOSCOPY WITH PROPOFOL  N/A 02/03/2022   Procedure: COLONOSCOPY WITH PROPOFOL ;  Surgeon: [name], MD;  Location: [facility] ENDOSCOPY;  Service: Gastroenterology;  Laterality: N/A;   ESOPHAGOGASTRODUODENOSCOPY (EGD) WITH PROPOFOL  N/A 02/03/2022   Procedure: ESOPHAGOGASTRODUODENOSCOPY (EGD) WITH PROPOFOL ;  Surgeon: [name], MD;  Location: [facility] ENDOSCOPY;  Service: Gastroenterology;  Laterality: N/A;   melanoma exicison     right leg   Patient Active Problem List   Diagnosis Date Noted   Onychomycosis 02/26/2024   CKD stage G2/A2, GFR 60-89 and albumin creatinine ratio 30-299 mg/g 02/13/2024   Stroke (cerebrum) (HCC) 02/08/2024   Acute focal neurological  deficit 02/04/2024   Peripheral neuropathy 08/22/2023   Osteoarthritis of left knee 08/22/2023   BPV (benign positional vertigo) 02/06/2023   Seborrhea 11/07/2022   History of melanoma 04/12/2022   Alcohol dependence in remission (HCC) 04/12/2022   Aortic atherosclerosis (HCC) 04/12/2022   Alcohol-induced chronic pancreatitis (HCC) 03/15/2022   History of CVA (cerebrovascular accident) 02/28/2022   Gastritis and gastroduodenitis    DNR (do not resuscitate) 02/01/2022   Vertigo 01/24/2022   Elevated PSA 11/24/2021   History of immunotherapy 11/25/2019   Melanoma metastatic to brain (HCC) 08/08/2016   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HCC) 08/13/2014   Metastatic melanoma to lung (HCC) 12/12/2012   Type 2 diabetes mellitus (HCC) 08/20/2007   Dyslipidemia 08/20/2007   Essential hypertension 08/19/2007    PCP: Graig Lawyer, MD  REFERRING PROVIDER: Alessandra Ancona, MD  REFERRING DIAG: Diagnosis R29.898 (ICD-10-CM) - Right leg weakness Z86.73 (ICD-10-CM) - History of CVA (cerebrovascular accident)  THERAPY DIAG:  Muscle weakness (generalized)  Unsteadiness on feet  Other disturbances of skin sensation  Other lack of coordination  Difficulty in walking, not elsewhere classified  Rationale for Evaluation and Treatment: Rehabilitation  ONSET DATE: 02/04/24  SUBJECTIVE:   SUBJECTIVE STATEMENT:  Doing fine, using the cane more.    Patient was being seen here for leg pain and weakness in February.  He was admitted to the hospital on 02/04/24, was discharged on 02/16/24.  Kenneth [name] is a 63 y.o. male who presented to [facility] ED with c/o slurred speech, facial droop, and R arm tingling. MRI on 02/04/2024 revealed Two adjacent acute infarcts within the left corona radiata, left lentiform nucleus and posterior limb of left internal capsule. PMHx includes alcohol dependence, chronic calcific pancreatitis, CVA, DM, HLD, HTN, melanoma, polyneuropathy   PERTINENT HISTORY: See above  PAIN:  Are you  having pain? No 0/10 now   PRECAUTIONS: Fall  RED FLAGS: None   WEIGHT BEARING RESTRICTIONS: No  FALLS:  Has patient fallen in last 6 months? Yes, 1 LIVING ENVIRONMENT: Lives with: lives with their family Lives in: House/apartment- townhouse  Stairs: No Has following equipment at home: Environmental consultant - 2 wheeled and Wheelchair (manual)  OCCUPATION: not working   PLOF: Independent with gait, Independent with transfers, and Requires assistive device for independence  PATIENT GOALS: to be able to get around on my own completely, be more independent   NEXT MD VISIT: [place] tomorrow, neurologist April 28th  OBJECTIVE:  Note: Objective measures were completed at Evaluation unless otherwise noted.  DIAGNOSTIC FINDINGS:   02/04/24 IMPRESSION: 1. Two adjacent acute infarcts within the left corona radiata, left lentiform nucleus and posterior limb of left internal capsule (individually measuring up to 2 cm). 2. Moderate-sized focus of chronic cortical encephalomalacia/gliosis within the anterolateral left frontal lobe (deep to a cranioplasty). 3. Background parenchymal atrophy, chronic small vessel ischemic disease and chronic lacunar infarcts, as described. 4. Several chronic microhemorrhages in a distribution suggesting sequelae of hypertensive microangiopathy. 5. Mild inflammatory left maxillary sinus disease. COGNITION: Overall cognitive status: Within functional limits for tasks assessed, slurred speech     SENSATION: Not tested hx of neuropathy   COORDINATION  Impaired coordination R LE, facial droop right, slurred speech and difficulty blinking right eye   LOWER EXTREMITY MMT:  MMT Right eval Left eval  Hip flexion 3+ 4+  Hip extension    Hip abduction 4- 5  Hip adduction    Hip internal rotation    Hip external rotation    Knee flexion 4- 5  Knee extension 4- 5  Ankle dorsiflexion 4- 4-  Ankle plantarflexion    Ankle inversion    Ankle eversion     (Blank rows  = not tested)   FUNCTIONAL TESTS:  TUG: 26 seconds with 4WW 5 times sit to stand: 25 seconds use of BUEs on chair, posterior unsteadiness/poor eccentric control  Berg: 24/56 3 minute walk test: 350 feet with 4WW, c/o fatigue and the right foot started to drag  GAIT: Distance walked: 376ft Assistive device utilized: 4WW Level of assistance: SBA Comments:  limited WB RLE, limited ankle DF and knee ROM during gait cycle , mild toe drag when fatigued                                                                                                                                TREATMENT DATE:  04/29/24 NuStep L5x51mins LAQ 5# 2x12 Green band HS curls 2x12 5# marching w/cane minA  Calf raises with 5# ankle weights on 2x12 STS with shoulder flexion using 2# WaTE 2x10    04/21/24 Recheck goals - 548ft w/RW  -TUG 14.41s -BERG 35/56 -MMT 5/5  NuStep L5x58mins  Box taps 6" by stairs 1HRA  STS 2x10 from chair height w/airex on top    04/01/24 NuStep L5x57mins  Walking with SPC, then without CGA  Side steps CGA  Walking forwards and backwards CGA STS 2x10 Step ups 4" 1HRA   Tandem stance in bars 15s at best    03/27/24 Nustep level 5 x 8 minutes 20# HS curls 5# leg extension 2x10 , then right leg only 2x5 On airex reaching for numbers alternating hands STS with weighted ball reach, this was tough for him needed cues for nose over toes Standing ball toss with him holding walker and then trying without but it in front of him Tandem stance blue foam 2x30 seconds Side steps on solid surface progressing to blue foam pad x3 rounds total Narrow BOS on blue foam with head turns  Gait with CGA and gait belt with SPC x 200 feet Then without device  03/19/24  Nustep L5x8 minutes seat 7   Standing regular BOS blue foam pad 3x30 seconds close S  Standing on foam with cross midline reaches to random numbers  Standing on blue foam pad with toe taps to targets (random call outs) U  UE support min guard, random combinations   Standing marches 3# 2x10 B Standing HS curls 3# 2x10 B Standing hip ABD 3# 2x10 B Squats in // bars 2x10   03/14/24 NuStep L5x62mins  STS 2x10 LAQ 2# 2x10 Standing marches 2# 20 reps alt x2 HS curls green 2x10 Box taps 4" by stairs 1HRA, then x10 alt without  TUG- 23s   PATIENT EDUCATION:  Education details: exam, POC, HEP  Person educated: Patient Education method: Explanation, Demonstration, and Handouts Education comprehension: verbalized understanding, returned demonstration, and needs further education  HOME EXERCISE PROGRAM: Access Code: [email or web address] URL: [email or web address] Date: 02/25/2024 Prepared by: Cherylene Corrente  Exercises - Seated Long Arc Quad  - 2 x daily - 7 x weekly - 2 sets - 10 reps - 3 hold - Seated March  - 2 x daily - 7 x weekly - 2 sets - 10 reps - 3 hold - Seated Heel Toe Raises  - 2 x daily - 7 x weekly - 2 sets - 10 reps - 3 hold  ASSESSMENT:  CLINICAL IMPRESSION: Pt arrives today doing well, he is ambulating with his cane today. Reports the this is one of the first few times he has been out with just the cane. We continued to work on strength and balance. Unsteady with marching using SPC, minA needed to prevent falling. He has difficulty with foot placement and decreased motor control with the RLE. Has 1 more PT visit scheduled and then would like to save the remaining for the rest of the year.    Patient is a 63 y.o. M who was seen today for physical therapy evaluation and treatment for s/p CVA, difficulty walking and weakness.   RLE is weaker than the L.  Of more concern is general balance and coordination- didn't score well on Berg and is definitely a high fall risk. TUG and 5XSTS put him at risk for falls as well, with standing balance he tended to be back on his heels and  did lose balance twice on the Berg requiring mod A to correct.  Will benefit from skilled PT services to address all  findings and assist in return to optimal level of function.   OBJECTIVE IMPAIRMENTS: Abnormal gait, decreased activity tolerance, decreased balance, decreased knowledge of use of DME, decreased mobility, difficulty walking, and decreased strength.   ACTIVITY LIMITATIONS: sitting, standing, squatting, stairs, transfers, and locomotion level  PARTICIPATION LIMITATIONS: driving, shopping, community activity, occupation, and yard work  PERSONAL FACTORS: Age, Fitness, Past/current experiences, Social background, and Time since onset of injury/illness/exacerbation are also affecting patient's functional outcome.   REHAB POTENTIAL: Good  CLINICAL DECISION MAKING: Stable/uncomplicated  EVALUATION COMPLEXITY: Low   GOALS: Goals reviewed with patient? No  SHORT TERM GOALS: Target date: 03/24/24   Will be compliant with appropriate progressive HEP  Baseline: Goal status: progressing 03/14/24  2.  Will score at least 35 on Berg to show improved balance  Baseline:  Goal status: ongoing 03/27/24, MET 04/22/24   3.  Will be able to name 3 ways to prevent fall at home and in the community  Baseline:  Goal status: no carpets, no loose rugs, has a lights in room so can see at night MET 03/14/24    LONG TERM GOALS: Target date: 05/27/24    MMT to have improved by one grade in all weak groups Baseline:  Goal status: MET 04/22/24  2.  Will score at least 42 on Berg to show reduced fall risk  Baseline: 24 Goal status: 34 ONGOING 04/22/24  3.  Will be able to perform all daily dressing/self care activities on an independent basis without LOB  Baseline:  Goal status: "I can do it on my own just standing there" 04/22/24  4.  Will be able to ambulate at least 556ft in with LRAD to show improved community access, fatigue no more than 3/10 Baseline:  Goal status: distance met with RW but fatigue at 5/10 ONGOING 04/22/24  5.  Decrease TUG time to 15 seconds Baseline:  Goal status: 23s  03/14/24, 14.41s MET 04/22/24     PLAN:  PT FREQUENCY: 1-2x/week  PT DURATION: 12 weeks  PLANNED INTERVENTIONS: 97164- PT Re-evaluation, 97110-Therapeutic exercises, 97530- Therapeutic activity, 97112- Neuromuscular re-education, 97535- Self Care, 01601- Manual therapy, [phone]- Gait training, [phone]- Aquatic Therapy, Patient/Family education, Balance training, Stair training, and Moist heat  PLAN FOR NEXT SESSION: heavy focus on balance, strengthening as able, general conditioning. Safety training/education as needed   Donavon Fudge, PT, DPT 04/29/24 12:27 PM   For all possible CPT codes, reference the Planned Interventions line above.     Check all conditions that are expected to impact treatment: {Conditions expected to impact treatment:Morbid obesity, Diabetes mellitus, Neurological condition and/or seizures, and Social determinants of health   If treatment provided at initial evaluation, no treatment charged due to lack of authorization.

## 2024-04-29 ENCOUNTER — Ambulatory Visit

## 2024-04-29 ENCOUNTER — Ambulatory Visit: Admitting: Speech Pathology

## 2024-04-29 ENCOUNTER — Ambulatory Visit: Admitting: Occupational Therapy

## 2024-04-29 ENCOUNTER — Encounter: Payer: Self-pay | Admitting: Occupational Therapy

## 2024-04-29 DIAGNOSIS — R262 Difficulty in walking, not elsewhere classified: Secondary | ICD-10-CM

## 2024-04-29 DIAGNOSIS — R278 Other lack of coordination: Secondary | ICD-10-CM

## 2024-04-29 DIAGNOSIS — R2681 Unsteadiness on feet: Secondary | ICD-10-CM

## 2024-04-29 DIAGNOSIS — M6281 Muscle weakness (generalized): Secondary | ICD-10-CM | POA: Diagnosis not present

## 2024-04-29 DIAGNOSIS — R208 Other disturbances of skin sensation: Secondary | ICD-10-CM

## 2024-04-29 DIAGNOSIS — R1312 Dysphagia, oropharyngeal phase: Secondary | ICD-10-CM

## 2024-04-29 NOTE — Therapy (Signed)
 OUTPATIENT OCCUPATIONAL THERAPY NEURO Treatment  Patient Name: Kenneth [name] MRN: [id number] DOB:[birth year], 63 y.o., male Today's Date: 04/29/2024  PCP: Dr. Therese [name] REFERRING PROVIDER: Dr. Alessandra [name] OCCUPATIONAL THERAPY DISCHARGE SUMMARY    Current functional level related to goals / functional outcomes: Pt met 5/6 long term goals. He made good overall progress.   Remaining deficits: decreased strength, decreased coordination, sensory deficits, decreased balance.   Education / Equipment: Pt was educated in [employer] and safety due to Retail buyer. He verbalizes understanding.   Patient agrees to discharge. Patient goals were partially met. Patient is being discharged due to financial reasons. and good overall progress. Pt has limited visits due to Medicaid.    END OF SESSION:  OT End of Session - 04/29/24 1222     Visit Number 7    Date for OT Re-Evaluation 05/26/24    Authorization Type [employer] Medicaid    OT Start Time 1230    OT Stop Time 1310    OT Time Calculation (min) 40 min               Past Medical History:  Diagnosis Date   Alcohol dependence (HCC)    Chronic calcific pancreatitis (HCC)    CVA (cerebral vascular accident) (HCC) 01/24/2022   Diabetes mellitus without complication (HCC)    Hyperlipidemia    Hypertension    Melanoma (HCC) 2013   metastatic - resected and cured with chemo/immuno tx   Pancreatic cyst-mass?    Polyneuropathy    Past Surgical History:  Procedure Laterality Date   BIOPSY  02/03/2022   Procedure: BIOPSY;  Surgeon: Kenneth Peacemaker, MD;  Location: [facility] ENDOSCOPY;  Service: Gastroenterology;;   BRAIN SURGERY  2015   to check for possible malignancy from melanoma, result were scar tissue   COLONOSCOPY WITH PROPOFOL  N/A 02/03/2022   Procedure: COLONOSCOPY WITH PROPOFOL ;  Surgeon: Kenneth Peacemaker, MD;  Location: [facility] ENDOSCOPY;  Service: Gastroenterology;  Laterality: N/A;   ESOPHAGOGASTRODUODENOSCOPY (EGD) WITH PROPOFOL  N/A  02/03/2022   Procedure: ESOPHAGOGASTRODUODENOSCOPY (EGD) WITH PROPOFOL ;  Surgeon: Kenneth Peacemaker, MD;  Location: [facility] ENDOSCOPY;  Service: Gastroenterology;  Laterality: N/A;   melanoma exicison     right leg   Patient Active Problem List   Diagnosis Date Noted   Onychomycosis 02/26/2024   CKD stage G2/A2, GFR 60-89 and albumin creatinine ratio 30-299 mg/g 02/13/2024   Stroke (cerebrum) (HCC) 02/08/2024   Acute focal neurological deficit 02/04/2024   Peripheral neuropathy 08/22/2023   Osteoarthritis of left knee 08/22/2023   BPV (benign positional vertigo) 02/06/2023   Seborrhea 11/07/2022   History of melanoma 04/12/2022   Alcohol dependence in remission (HCC) 04/12/2022   Aortic atherosclerosis (HCC) 04/12/2022   Alcohol-induced chronic pancreatitis (HCC) 03/15/2022   History of CVA (cerebrovascular accident) 02/28/2022   Gastritis and gastroduodenitis    DNR (do not resuscitate) 02/01/2022   Vertigo 01/24/2022   Elevated PSA 11/24/2021   History of immunotherapy 11/25/2019   Melanoma metastatic to brain (HCC) 08/08/2016   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HCC) 08/13/2014   Metastatic melanoma to lung (HCC) 12/12/2012   Type 2 diabetes mellitus (HCC) 08/20/2007   Dyslipidemia 08/20/2007   Essential hypertension 08/19/2007    ONSET DATE: referral 02/14/24  REFERRING DIAG: CVA  THERAPY DIAG:  Muscle weakness (generalized)  Unsteadiness on feet  Other disturbances of skin sensation  Other lack of coordination  Rationale for Evaluation and Treatment: Rehabilitation  SUBJECTIVE:   SUBJECTIVE STATEMENT: Pt agrees with plans for d/c today Pt accompanied by:  self  PERTINENT HISTORY: Kenneth [name] is a 63 y.o. male who presented to [facility] ED with c/o slurred speech, facial droop, and R arm tingling. MRI on 02/04/2024 revealed Two adjacent acute infarcts within the left corona radiata, left lentiform nucleus and posterior limb of left internal capsule. PMHx includes alcohol dependence,  chronic calcific pancreatitis, CVA, DM, HLD, HTN, melanoma, polyneuropathy  PRECAUTIONS: Fall, thick liquids- on water protocol, if teeth have been brushed  WEIGHT BEARING RESTRICTIONS: No  PAIN:  Are you having pain? No  FALLS: Has patient fallen in last 6 months? No  LIVING ENVIRONMENT: Lives with: lives with their family Lives in: House/apartment Stairs: Yes: External: 4 steps;  Has following equipment at home: Single point cane, Walker - 2 wheeled, and shower chair  PLOF: Independent  PATIENT GOALS: get back to where I was before  OBJECTIVE:  Note: Objective measures were completed at Evaluation unless otherwise noted.  HAND DOMINANCE: Right  ADLs: Overall ADLs: mod I with basic ADLS Transfers/ambulation related to ADLs: Eating: using LUE predominantly for eating, pt did htis before CVA, difficulty with cutting Grooming: currently using non dominant LUE UB Dressing: mod I LB Dressing: mod I Toileting: mod I Bathing: mod I Tub Shower transfers: mod I Equipment: Shower seat without back, grab bar   IADLs: Shopping: dependent Light housekeeping: dependent Meal Prep: has not attempted , previously microwave Community mobility: mod I with rollator Medication management: sister assists Financial management: sister assists Handwriting: 90% legible, with increased time  MOBILITY STATUS: mod I with rollator     FUNCTIONAL OUTCOME MEASURES: Quick Dash: 52.5 % disability  UPPER EXTREMITY ROM:  LUE WFLS  Active ROM Right eval Left eval  Shoulder flexion 95   Shoulder abduction    Shoulder adduction    Shoulder extension    Shoulder internal rotation    Shoulder external rotation    Elbow flexion WFL   Elbow extension WFLs   Wrist flexion    Wrist extension    Wrist ulnar deviation    Wrist radial deviation    Wrist pronation WFLs   Wrist supination 90%   (Blank rows = not tested)  UPPER EXTREMITY MMT:     MMT Right eval Left eval  Shoulder  flexion 3/5 4/5  Shoulder abduction    Shoulder adduction    Shoulder extension    Shoulder internal rotation    Shoulder external rotation    Middle trapezius    Lower trapezius    Elbow flexion 4/5 4+/5  Elbow extension 4/5 4+/5  Wrist flexion    Wrist extension    Wrist ulnar deviation    Wrist radial deviation    Wrist pronation    Wrist supination    (Blank rows = not tested)  HAND FUNCTION: Grip strength: Right: 26 lbs; Left: 30 lbs  COORDINATION: 9 Hole Peg test: Right: 69.13 sec; Left: 37.64 sec Box and Blocks:  Right 32blocks, Left 43blocks  SENSATION: Light touch: Impaired RUE    COGNITION: Overall cognitive status: Within functional limits for tasks assessed    VISION ASSESSMENT: Not tested- denies changes      OBSERVATIONS: Pleasant gentleman motivated to improve  TREATMENT DATE:5/27/25Therapist checked progress towards goals in anticipation of d/c. Reviewed coordination HEP, min v.c then pt returned demonstration. Handwriting task, pt printed a sentence with grossly 95% legibility. Quick DASH:32.5% impairment  04/22/24- Pt ambulated with cane  Cane exercises for shoulder flexion, chest press, abduction, min v.c initally, improved positioning and performance with repetition. Ambulating with cane while carrying a plate in RUE, without drops then carrying a glass of water in R hand, without drops. Min v.c to keep elbow and plate/ glass close to body for increased control. UE ranger for shoulder flexion in standing followed by circumduction, min facilitation/ v.c. Open chain functional reach to retrieve/ replace items from shoulder height level shelf, min difficulty/ v.c Pt with improved control. Writing activity to print the alphabet then print a sentence, min v.c for perfromance. Pt wrote a sentence in cursive with 80-90%  accuracy, pt printed a sentence end of session with 100% legibility. Therapist started checking goals.    04/01/24 UBE x 6 mins level 1 for conditioning. Coordination HEP issued for RUE, pt returned demonstration following education.  03/27/24- Reviewed cane exercises for shoulder flexion, chest press and abduction, min v.c  for shoulder positioning, 10 reps  each with mirror in front for feedback Pt practiced cutting food with foam grip on knife in right hand, and holding fork with left hand, min v.c.  Pt was able to sucessfully cut several pieces with repetition/ practice. Fine motor coordination activity  with cognitive component to copy small peg design with RUE min difficulty/ v.c Removing pegs with in hand manipulation, min difficulty UBE x 5 mins level 1 for conditioning  03/19/24-Reviewed cane exercises for shoulder flexion, chest press and abduction, min v.c  10-15 reps each then coordination HEP issued, see pt instructions, min v.c Red putty exercises for sustained grip and pinch, min v.c- pt was issued putty at the hospital.  03/12/24- Flipping and dealing playing cards with RUE, rotating ball in hand for increased fine motor coordination, min difficulty/ v.c Placing metal pegs 3 sizes into pegboard with RUE then removing with in hand manipulation, min difficulty v.c  Cane exercises for shoulder flexion, abduction and chest press 15 reps each., min v.c  03/03/24- eval         PATIENT EDUCATION: Education details: coordination HEP, progress towards goals and plans for d/c Person educated: Patient Education method: Explanation, demonstration,  Education comprehension: verbalized understanding, returned demonstraiton, v.c and demonstration   HOME EXERCISE PROGRAM: cane exercises , coordination exercises, red putty   GOALS: Goals reviewed with patient? Yes  SHORT TERM GOALS: Target date: 04/02/24  I with HEP Baseline:dependent Goal status:  met, for cane exercises   2.   Pt will demonstrate improved RUE functional use as evidenced by increasing box/ blocks score by 4 blocks. Baseline: RUE 32 blocks, LUE 43 blocks Goal status: met 36 blocks 04/29/24  3.  Pt will demonstrate improved fine motor coordiantion for ADLs as evidenced by decreasing 9 hole peg test score to 64 secs or less Baseline: RUE 69.13, LUE 37.64 Goal status:   met 50.17 secs 04/29/24  4.  Pt will increase RUE grip strength to 30 lbs or greater for increased functional use. Baseline: RUE 26 lbs, LUE 30 lbs Goal status: met 35, 04/01/24  5.  I with sensory precautions Baseline: dependent Goal status:met, pt verbalizes understanding 03/27/24  6.  Pt will use his RUE to brush his teeth at least 75% of the time Baseline: uses LUE Goal status:met, 04/22/24, uses  75-90% of the time  LONG TERM GOALS: Target date: 05/26/24  I with updated HEP Baseline: dependent Goal status:met, pt demonstrates understanding of coordination HEP. 04/29/24  2.  Pt will perform basic home management modified independently Baseline: dependent Goal status:met at prior level per pt report, Pt is preparing sandwiches,  picks up dishes, spreads up bed.04/29/24  3.  Pt will demonstrate improved fine motor coordiantion for ADLs as evidenced by decreasing 9 hole peg test score to 56 secs or less Baseline: RUE 69.13, LUE 37.64 Goal status:  met- 50.17 04/29/24   4.  Pt will improve Quick Dash score to 45% or better disability Baseline: 52.5% Goal status: met, 32.5%  5.  Pt will report increased ease cutting food with RUE Baseline: pt reports difficulty cutting food with RUE Goal status: met, 04/22/24  6.  Pt will write a sentence with 100% legibility using RUE. Baseline: 90% legibility Goal status: partially met, 95-100% legibility with printing 04/29/24  ASSESSMENT:  CLINICAL IMPRESSION:Pt made progress towards goals. He achieved 5/6 long term goals.He agrees with plans for d/c.      PERFORMANCE DEFICITS: in  functional skills including ADLs, IADLs, coordination, dexterity, sensation, ROM, strength, pain, flexibility, Fine motor control, Gross motor control, mobility, balance, endurance, decreased knowledge of precautions, decreased knowledge of use of DME, and UE functional use, , and psychosocial skills including coping strategies, environmental adaptation, habits, interpersonal interactions, and routines and behaviors.   IMPAIRMENTS: are limiting patient from ADLs, IADLs, rest and sleep, work, play, leisure, and social participation.   CO-MORBIDITIES: may have co-morbidities  that affects occupational performance. Patient will benefit from skilled OT to address above impairments and improve overall function.  MODIFICATION OR ASSISTANCE TO COMPLETE EVALUATION: No modification of tasks or assist necessary to complete an evaluation.  OT OCCUPATIONAL PROFILE AND HISTORY: Detailed assessment: Review of records and additional review of physical, cognitive, psychosocial history related to current functional performance.  CLINICAL DECISION MAKING: LOW - limited treatment options, no task modification necessary  REHAB POTENTIAL: Good  EVALUATION COMPLEXITY: Low    PLAN:  OT FREQUENCY: 1x/week  OT DURATION: 12 weeks, anticipate wrapping up after 6-8 visits due to insurance limitations, and based upon progress.  PLANNED INTERVENTIONS: 97168 OT Re-evaluation, 97535 self care/ADL training, 40981 therapeutic exercise, 97530 therapeutic activity, 97112 neuromuscular re-education, 97140 manual therapy, 97035 ultrasound, 97018 paraffin, 19147 moist heat, 97010 cryotherapy, 97014 electrical stimulation unattended, passive range of motion, functional mobility training, energy conservation, coping strategies training, patient/family education, and DME and/or AE instructions  RECOMMENDED OTHER SERVICES: PT, ST  CONSULTED AND AGREED WITH PLAN OF CARE: Patient  PLAN FOR NEXT SESSION:  d/c OT   Cortlyn Cannell,  OT 04/29/2024, 12:23 PM

## 2024-04-29 NOTE — Therapy (Signed)
 OUTPATIENT SPEECH LANGUAGE PATHOLOGY TREATMENT & DISCHARGE SUMMARY  Patient Name: Kenneth [name] MRN: [id number] DOB:02-18-1961, 63 y.o., male Today's Date: 04/29/2024  PCP: Kenneth Lawyer, MD REFERRING PROVIDER: Graig Lawyer, MD  SPEECH THERAPY DISCHARGE SUMMARY  Visits from Start of Care: 7  Current functional level related to goals / functional outcomes: Met all goals.   Remaining deficits: Mild dysarthria, dysphagia *D/c'd on regular and thin liquid diet w/ safe swallow strategies.   Education / Equipment: Completed   Patient agrees to discharge. Patient goals were met. Patient is being discharged due to being pleased with the current functional level..     END OF SESSION:  End of Session - 04/29/24 1122     Visit Number 7    Number of Visits 8    Date for SLP Re-Evaluation 04/27/24    SLP Start Time 1100    SLP Stop Time  1130    SLP Time Calculation (min) 30 min    Activity Tolerance Patient tolerated treatment well             Past Medical History:  Diagnosis Date   Alcohol dependence (HCC)    Chronic calcific pancreatitis (HCC)    CVA (cerebral vascular accident) (HCC) 01/24/2022   Diabetes mellitus without complication (HCC)    Hyperlipidemia    Hypertension    Melanoma (HCC) 2013   metastatic - resected and cured with chemo/immuno tx   Pancreatic cyst-mass?    Polyneuropathy    Past Surgical History:  Procedure Laterality Date   BIOPSY  02/03/2022   Procedure: BIOPSY;  Surgeon: Kenneth Peacemaker, MD;  Location: [facility] ENDOSCOPY;  Service: Gastroenterology;;   BRAIN SURGERY  2015   to check for possible malignancy from melanoma, result were scar tissue   COLONOSCOPY WITH PROPOFOL  N/A 02/03/2022   Procedure: COLONOSCOPY WITH PROPOFOL ;  Surgeon: Kenneth Peacemaker, MD;  Location: [facility] ENDOSCOPY;  Service: Gastroenterology;  Laterality: N/A;   ESOPHAGOGASTRODUODENOSCOPY (EGD) WITH PROPOFOL  N/A 02/03/2022   Procedure: ESOPHAGOGASTRODUODENOSCOPY (EGD)  WITH PROPOFOL ;  Surgeon: Kenneth Peacemaker, MD;  Location: [facility] ENDOSCOPY;  Service: Gastroenterology;  Laterality: N/A;   melanoma exicison     right leg   Patient Active Problem List   Diagnosis Date Noted   Onychomycosis 02/26/2024   CKD stage G2/A2, GFR 60-89 and albumin creatinine ratio 30-299 mg/g 02/13/2024   Stroke (cerebrum) (HCC) 02/08/2024   Acute focal neurological deficit 02/04/2024   Peripheral neuropathy 08/22/2023   Osteoarthritis of left knee 08/22/2023   BPV (benign positional vertigo) 02/06/2023   Seborrhea 11/07/2022   History of melanoma 04/12/2022   Alcohol dependence in remission (HCC) 04/12/2022   Aortic atherosclerosis (HCC) 04/12/2022   Alcohol-induced chronic pancreatitis (HCC) 03/15/2022   History of CVA (cerebrovascular accident) 02/28/2022   Gastritis and gastroduodenitis    DNR (do not resuscitate) 02/01/2022   Vertigo 01/24/2022   Elevated PSA 11/24/2021   History of immunotherapy 11/25/2019   Melanoma metastatic to brain (HCC) 08/08/2016   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HCC) 08/13/2014   Metastatic melanoma to lung (HCC) 12/12/2012   Type 2 diabetes mellitus (HCC) 08/20/2007   Dyslipidemia 08/20/2007   Essential hypertension 08/19/2007    ONSET DATE: 02/08/24   REFERRING DIAG:  Free Text Diagnosis  CVA    THERAPY DIAG:  Oropharyngeal dysphagia  Rationale for Evaluation and Treatment: Rehabilitation  SUBJECTIVE:   SUBJECTIVE STATEMENT: Pt reports he had to repeat himself x1 to a friend  Pt accompanied by: self, sisters are bringing to  therapy  PERTINENT HISTORY: Per chart review: 63 y.o. right-handed male with history of HTN, CVA, recent fall due to RLE weakness, EtOH abuse in the past, T2DM, polyneuropathy, melanoma with metastasis to the brain and lung (in remission)   PAIN:  Are you having pain? No  FALLS: Has patient fallen in last 6 months?  No  LIVING ENVIRONMENT: Lives with: sister Kenneth [name]) Lives in: House/apartment  PLOF:  Level of  assistance: Independent with ADLs, Independent with IADLs Employment: Other: working on disability. Out of work since CVA in 2023. 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   PATIENT GOALS: speech, swallowing  OBJECTIVE:  Note: Objective measures were completed at Evaluation unless otherwise noted.  DIAGNOSTIC FINDINGS: See EMR for full report.     COGNITION: Overall cognitive status: Within functional limits for tasks assessed; pt reports no changes. Assessment in CIR deemed cognition WFL.  Areas of impairment:  NA Functional deficits: NA   MOTOR SPEECH: Overall motor speech: impaired Level of impairment: Phrase, Sentence, and Conversation Respiration: WFL Phonation: normal Resonance: WFL Articulation: Impaired: phrase, sentence, and conversation Intelligibility: Intelligibility reduced Motor planning: Appears intact Motor speech errors: NA Interfering components: NA Effective technique: slow rate, increased vocal intensity, and over articulate  ORAL MOTOR EXAMINATION: Overall status: Impaired:   Labial: Right (Symmetry and Strength) Lingual: Right (Symmetry and Strength) Facial: Right (Symmetry and Strength) Velum: ROM Cough: WFL Comments:   RECOMMENDATIONS FROM OBJECTIVE SWALLOW STUDY (MBSS/FEES):    Modified Barium Swallow 02/12/24  Recommendations/Plan: Swallowing Evaluation Recommendations Swallowing Evaluation Recommendations Recommendations: PO diet PO Diet Recommendation: Regular; Mildly thick liquids (Level 2, nectar thick) Liquid Administration via: Spoon; Cup; No straw Medication Administration: Whole meds with puree Supervision: Patient able to self-feed; Intermittent supervision/cueing for swallowing strategies Swallowing strategies  : Minimize environmental distractions; Slow rate; Small bites/sips Postural changes: Position pt fully upright for meals Oral care recommendations: Oral care BID (2x/day) Caregiver  Recommendations: Avoid jello, ice cream, thin soups, popsicles; Remove water pitcher  Modified Barium Swallow 02/05/24  Recommendations/Plan: Swallowing Evaluation Recommendations Swallowing Evaluation Recommendations Recommendations: PO diet PO Diet Recommendation: Dysphagia 3 (Mechanical soft); Moderately thick liquids (Level 3, honey thick) Liquid Administration via: Spoon; Cup Medication Administration: Whole meds with puree Supervision: Patient able to self-feed; Intermittent supervision/cueing for swallowing strategies Swallowing strategies  : Minimize environmental distractions; Slow rate; Small bites/sips Postural changes: Position pt fully upright for meals Oral care recommendations: Oral care BID (2x/day) Caregiver Recommendations: Avoid jello, ice cream, thin soups, popsicles; Remove water pitcher     CLINICAL SWALLOW ASSESSMENT:   Current diet: regular and nectar thick liquids Dentition: adequate natural dentition Patient directly observed with POs: No Feeding: able to feed self Liquids provided by: Not tested; no straws Oral phase signs and symptoms: Not tested Pharyngeal phase signs and symptoms: Not tested Comments: To be assessed next session.    PATIENT REPORTED OUTCOME MEASURES (PROM): EAT-10: 14   04/29/24: EAT-10: 4 (improved from eval)  TREATMENT DATE:   04/29/24: Pt was seen for skilled ST services targeting dysphagia. SLP reviewed MBS results and recommendations with patient. He has no questions at this time. SLP provided him with aspiration PNA handout, as well as a print out his MBS for him. Pt is using speech compensations especially in environments where speech can be misunderstood re: loud environments, phone, drive-thru. Pt feels confident with pharyngeal strengthening exercises and feels prepared for d/c.   04/01/24: Pt was seen for  skilled ST services targeting dysphagia. Pt was able to perform all pharyngeal exercises independently. SLP suggested placing patient on "hold" due to insurance visit limit until after the MBS is performed. Pt in agreement. SLP has reached out to PCP for referral. Once received, SLP to reach out to pt to have him schedule.    03/27/24: Pt was seen for skilled ST services targeting dysphagia. Pt reports he hasn't noticed any instances of becoming strangled with water (or any other solid/thickened liquid). He is currently completing swallowing exercises x1/day, with the effortful swallow throughout the day.   CTAR: x20 - 5 second hold  Masako x20  - required extra time to complete; became to initiate swallow in the final 10.   Effortful Swallow x10  -indep completed  Supraglottic Swallow x6 - x4 overt s/sx of aspiration (immediate cough). SLP suspects muscle fatigue due to completion of exercises prior. Discontinued these exercises with water.   Speech was 100% intelligible in quiet area with familiar listener. Pt reports he knows what strategies to use when he needs them.   03/19/24: Pt was seen for skilled ST services targeting dysphagia and dysarthria. Pt reported he went to steakhouse for his birthday and everything "went down good". He reports he brought his nectar thick liquid packet with him to restaurant and requested no ice. He has been completing exercises at home x1/day. Pt reports he did not practice his functional phrases, but he reports he tried to "be clear" when speaking to his sister. He reports he has not had repeat himself very often - "it is tougher (to speak) in the evening". Pt and SLP reviewed functional strategies from last session. Pt read functional phrases with no speech errors. Pt does not have any questions about speech intelligibility strategies and strategies learned in CIR ("SLOP").    Pt completed oral care and SLP facilitated FFWP while completing effortful swallow  exercise. SLP encouraged pt to complete oral care at least 3x a day - after meals and ALWAYS before water. Pt was unable to complete with max cueing. To continue to trial. SLP introduced supraglottic swallow. Pt was able to complete with min cueing. To continue to practice use while drinking water at home.     Pt reports biting R cheek while eating - SLP suggested trying to masticate on opposite side, as well as taking smaller bites.   03/12/24: Pt was seen for skilled ST services targeting dysphagia and dysarthria. Pt forgot toothbrush, so unable to do liquid trials (FFWP) this session. Pt reports he has been completing all swallowing exercises once a day. Pt reports speech has been "alright", but that he has to repeat himself throughout day ~5x. SLP educated on "Be Clear" strategies. SLP assisted pt in developing functional phrases to practice throughout the day. SLP educated on when he would be most likely to use strategies re: drive thru, fatigued, phone. Pt to continue practice at home.    03/03/24: Pt was seen for skilled ST services targeting dysphagia. Of note,  pt reports his speech is 80% back to baseline at this time - more slurred when tired. When prompted, pt reports he has only completed exercises twice since last being seen - SLP encouraged to complete everyday. SLP suggested creating a routine to ensure exercises completion.   Completed the following exercises: CTAR: x10  - 5 second hold  Masako x10  - required extra time to complete; difficult to initiate. Trialed tongue at different places.   Effortful Swallow  -Visibly, SLP did not see any increased effort - though, pt reported it felt different. To continue practice.   Pt completed oral care prior to water trials today. SLP observed immediate and delayed coughs after every sip of water. SLP reviewed Consuella Denis Protocol and the importance of completing oral care prior to water trials. SLP observed pt with regular and NTL  trial. Pt was able to masticate effectively. No residue observed after liquid wash. No solids were left in buccal cavity. Pt performed a finger sweep to check for pocketing due to decreased sensation. Safe to continue regular and NTL diet.      PATIENT EDUCATION: Education details: SLP role  Person educated: Patient Education method: Chief Technology Officer Education comprehension: needs further education   GOALS: Goals reviewed with patient? Yes  SHORT TERM GOALS: Target date: 03/28/24  Pt will demonstrate use of pharyngeal strengthening exercises independently. Baseline: Goal status: MET  2.  Pt will recall safe swallow strategies independently Baseline:  Goal status: MET  3.  Pt will recall speech intelligibility compensations with minA verbal cues Baseline:  Goal status: MET    LONG TERM GOALS: Target date: 04/27/24  Pt will improve score on PROMs Baseline: EAT-10: 14 Goal status: MET  2.  Pt will utilize speech intelligibility compensations in conversation independently. Baseline:  Goal status: MET  3.  Pt will recall 3 environmental modifications for dysarthria Baseline:  Goal status: MET    ASSESSMENT:  CLINICAL IMPRESSION: Pt is a 63 yo male who presents to ST OP for evaluation post CVA. See tx note. Pt is to be discharged this date due to insurance visits. Pt feels comfortable with MBS recommendations. To continue exercises at home for x1/month and then move to maintenance phase.     OBJECTIVE IMPAIRMENTS: include dysarthria and dysphagia. These impairments are limiting patient from effectively communicating at home and in community and safety when swallowing. Factors affecting potential to achieve goals and functional outcome are NA. Patient will benefit from skilled SLP services to address above impairments and improve overall function.  REHAB POTENTIAL: Good; limited by insurance  PLAN:  SLP FREQUENCY: 1-2x/week  SLP DURATION: 8  weeks  PLANNED INTERVENTIONS: Aspiration precaution training, Pharyngeal strengthening exercises, Diet toleration management , Environmental controls, Trials of upgraded texture/liquids, Cueing hierachy, Functional tasks, SLP instruction and feedback, Compensatory strategies, Patient/family education, [phone] Treatment of speech (30 or 45 min) , and [id number] Treatment of swallowing function    [employer], CCC-SLP 04/29/2024, 11:23 AM

## 2024-05-06 ENCOUNTER — Ambulatory Visit: Admitting: Occupational Therapy

## 2024-05-06 ENCOUNTER — Ambulatory Visit: Admitting: Speech Pathology

## 2024-05-06 ENCOUNTER — Ambulatory Visit: Attending: Physical Medicine and Rehabilitation

## 2024-05-06 DIAGNOSIS — R2681 Unsteadiness on feet: Secondary | ICD-10-CM | POA: Diagnosis present

## 2024-05-06 DIAGNOSIS — R262 Difficulty in walking, not elsewhere classified: Secondary | ICD-10-CM | POA: Diagnosis present

## 2024-05-06 DIAGNOSIS — M6281 Muscle weakness (generalized): Secondary | ICD-10-CM | POA: Insufficient documentation

## 2024-05-06 DIAGNOSIS — R208 Other disturbances of skin sensation: Secondary | ICD-10-CM | POA: Insufficient documentation

## 2024-05-06 DIAGNOSIS — R278 Other lack of coordination: Secondary | ICD-10-CM | POA: Diagnosis present

## 2024-05-06 NOTE — Therapy (Signed)
 OUTPATIENT PHYSICAL THERAPY LOWER EXTREMITY TREATMENT   Patient Name: Kenneth [name] MRN: [id number] DOB:12-01-1961, 63 y.o., male Today's Date: 05/06/2024  END OF SESSION:  PT End of Session - 05/06/24 1142     Visit Number 8    Number of Visits 13    Date for PT Re-Evaluation 05/27/24    Authorization Type U[employer] MCD    PT Start Time 1145    PT Stop Time 1230    PT Time Calculation (min) 45 min    Equipment Utilized During Treatment Gait belt    Activity Tolerance Patient tolerated treatment well    Behavior During Therapy WFL for tasks assessed/performed                    Past Medical History:  Diagnosis Date   Alcohol dependence (HCC)    Chronic calcific pancreatitis (HCC)    CVA (cerebral vascular accident) (HCC) 01/24/2022   Diabetes mellitus without complication (HCC)    Hyperlipidemia    Hypertension    Melanoma (HCC) 2013   metastatic - resected and cured with chemo/immuno tx   Pancreatic cyst-mass?    Polyneuropathy    Past Surgical History:  Procedure Laterality Date   BIOPSY  02/03/2022   Procedure: BIOPSY;  Surgeon: [name], MD;  Location: [facility] ENDOSCOPY;  Service: Gastroenterology;;   BRAIN SURGERY  2015   to check for possible malignancy from melanoma, result were scar tissue   COLONOSCOPY WITH PROPOFOL  N/A 02/03/2022   Procedure: COLONOSCOPY WITH PROPOFOL ;  Surgeon: [name], MD;  Location: [facility] ENDOSCOPY;  Service: Gastroenterology;  Laterality: N/A;   ESOPHAGOGASTRODUODENOSCOPY (EGD) WITH PROPOFOL  N/A 02/03/2022   Procedure: ESOPHAGOGASTRODUODENOSCOPY (EGD) WITH PROPOFOL ;  Surgeon: [name], MD;  Location: [facility] ENDOSCOPY;  Service: Gastroenterology;  Laterality: N/A;   melanoma exicison     right leg   Patient Active Problem List   Diagnosis Date Noted   Onychomycosis 02/26/2024   CKD stage G2/A2, GFR 60-89 and albumin creatinine ratio 30-299 mg/g 02/13/2024   Stroke (cerebrum) (HCC) 02/08/2024   Acute focal neurological  deficit 02/04/2024   Peripheral neuropathy 08/22/2023   Osteoarthritis of left knee 08/22/2023   BPV (benign positional vertigo) 02/06/2023   Seborrhea 11/07/2022   History of melanoma 04/12/2022   Alcohol dependence in remission (HCC) 04/12/2022   Aortic atherosclerosis (HCC) 04/12/2022   Alcohol-induced chronic pancreatitis (HCC) 03/15/2022   History of CVA (cerebrovascular accident) 02/28/2022   Gastritis and gastroduodenitis    DNR (do not resuscitate) 02/01/2022   Vertigo 01/24/2022   Elevated PSA 11/24/2021   History of immunotherapy 11/25/2019   Melanoma metastatic to brain (HCC) 08/08/2016   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HCC) 08/13/2014   Metastatic melanoma to lung (HCC) 12/12/2012   Type 2 diabetes mellitus (HCC) 08/20/2007   Dyslipidemia 08/20/2007   Essential hypertension 08/19/2007    PCP: Graig Lawyer, MD  REFERRING PROVIDER: Alessandra Ancona, MD  REFERRING DIAG: Diagnosis R29.898 (ICD-10-CM) - Right leg weakness Z86.73 (ICD-10-CM) - History of CVA (cerebrovascular accident)  THERAPY DIAG:  Muscle weakness (generalized)  Unsteadiness on feet  Other disturbances of skin sensation  Other lack of coordination  Difficulty in walking, not elsewhere classified  Rationale for Evaluation and Treatment: Rehabilitation  ONSET DATE: 02/04/24  SUBJECTIVE:   SUBJECTIVE STATEMENT:  Feeling fine.    Patient was being seen here for leg pain and weakness in February.  He was admitted to the hospital on 02/04/24, was discharged on 02/16/24.   Kenneth [name] is a 63 y.o. male who presented to [facility] ED with c/o slurred speech, facial droop, and R arm tingling. MRI on 02/04/2024 revealed Two adjacent acute infarcts within the left corona radiata, left lentiform nucleus and posterior limb of left internal capsule. PMHx includes alcohol dependence, chronic calcific pancreatitis, CVA, DM, HLD, HTN, melanoma, polyneuropathy   PERTINENT HISTORY: See above  PAIN:  Are you having pain? No 0/10  now   PRECAUTIONS: Fall  RED FLAGS: None   WEIGHT BEARING RESTRICTIONS: No  FALLS:  Has patient fallen in last 6 months? Yes, 1 LIVING ENVIRONMENT: Lives with: lives with their family Lives in: House/apartment- townhouse  Stairs: No Has following equipment at home: Environmental consultant - 2 wheeled and Wheelchair (manual)  OCCUPATION: not working   PLOF: Independent with gait, Independent with transfers, and Requires assistive device for independence  PATIENT GOALS: to be able to get around on my own completely, be more independent   NEXT MD VISIT: Rudd tomorrow, neurologist April 28th  OBJECTIVE:  Note: Objective measures were completed at Evaluation unless otherwise noted.  DIAGNOSTIC FINDINGS:   02/04/24 IMPRESSION: 1. Two adjacent acute infarcts within the left corona radiata, left lentiform nucleus and posterior limb of left internal capsule (individually measuring up to 2 cm). 2. Moderate-sized focus of chronic cortical encephalomalacia/gliosis within the anterolateral left frontal lobe (deep to a cranioplasty). 3. Background parenchymal atrophy, chronic small vessel ischemic disease and chronic lacunar infarcts, as described. 4. Several chronic microhemorrhages in a distribution suggesting sequelae of hypertensive microangiopathy. 5. Mild inflammatory left maxillary sinus disease. COGNITION: Overall cognitive status: Within functional limits for tasks assessed, slurred speech     SENSATION: Not tested hx of neuropathy   COORDINATION  Impaired coordination R LE, facial droop right, slurred speech and difficulty blinking right eye   LOWER EXTREMITY MMT:  MMT Right eval Left eval  Hip flexion 3+ 4+  Hip extension    Hip abduction 4- 5  Hip adduction    Hip internal rotation    Hip external rotation    Knee flexion 4- 5  Knee extension 4- 5  Ankle dorsiflexion 4- 4-  Ankle plantarflexion    Ankle inversion    Ankle eversion     (Blank rows = not  tested)   FUNCTIONAL TESTS:  TUG: 26 seconds with 4WW 5 times sit to stand: 25 seconds use of BUEs on chair, posterior unsteadiness/poor eccentric control  Berg: 24/56 3 minute walk test: 350 feet with 4WW, c/o fatigue and the right foot started to drag  GAIT: Distance walked: 315ft Assistive device utilized: 4WW Level of assistance: SBA Comments:  limited WB RLE, limited ankle DF and knee ROM during gait cycle , mild toe drag when fatigued                                                                                                                                TREATMENT DATE:  05/06/24  Recheck goals STS 2x10 NuStep L5x39mins  Step ups 6"  Tandem walking in bars  SLS in bars Leg ext 5# 2x12 HS curls 20# 2x12   04/29/24 NuStep L5x26mins LAQ 5# 2x12 Green band HS curls 2x12 5# marching w/cane minA  Calf raises with 5# ankle weights on 2x12 STS with shoulder flexion using 2# WaTE 2x10    04/21/24 Recheck goals - 567ft w/RW  -TUG 14.41s -BERG 35/56 -MMT 5/5  NuStep L5x68mins  Box taps 6" by stairs 1HRA  STS 2x10 from chair height w/airex on top    04/01/24 NuStep L5x34mins  Walking with SPC, then without CGA  Side steps CGA  Walking forwards and backwards CGA STS 2x10 Step ups 4" 1HRA   Tandem stance in bars 15s at best    03/27/24 Nustep level 5 x 8 minutes 20# HS curls 5# leg extension 2x10 , then right leg only 2x5 On airex reaching for numbers alternating hands STS with weighted ball reach, this was tough for him needed cues for nose over toes Standing ball toss with him holding walker and then trying without but it in front of him Tandem stance blue foam 2x30 seconds Side steps on solid surface progressing to blue foam pad x3 rounds total Narrow BOS on blue foam with head turns  Gait with CGA and gait belt with SPC x 200 feet Then without device  03/19/24  Nustep L5x8 minutes seat 7   Standing regular BOS blue foam pad 3x30 seconds close S   Standing on foam with cross midline reaches to random numbers  Standing on blue foam pad with toe taps to targets (random call outs) U UE support min guard, random combinations   Standing marches 3# 2x10 B Standing HS curls 3# 2x10 B Standing hip ABD 3# 2x10 B Squats in // bars 2x10   03/14/24 NuStep L5x24mins  STS 2x10 LAQ 2# 2x10 Standing marches 2# 20 reps alt x2 HS curls green 2x10 Box taps 4" by stairs 1HRA, then x10 alt without  TUG- 23s   PATIENT EDUCATION:  Education details: exam, POC, HEP  Person educated: Patient Education method: Explanation, Demonstration, and Handouts Education comprehension: verbalized understanding, returned demonstration, and needs further education  HOME EXERCISE PROGRAM: Access Code: [email or web address] URL: [email or web address] Date: 02/25/2024 Prepared by: Cherylene Corrente, updated by Donavon Fudge  Exercises - Seated Long Arc Quad  - 2 x daily - 7 x weekly - 2 sets - 10 reps - 3 hold - Seated March  - 2 x daily - 7 x weekly - 2 sets - 10 reps - 3 hold - Seated Heel Toe Raises  - 2 x daily - 7 x weekly - 2 sets - 10 reps - 3 hold - Sit to Stand  - 2 x daily - 7 x weekly - 2 sets - 10 reps - Standing Hip Abduction with Counter Support  - 2 x daily - 7 x weekly - 2 sets - 10 reps - Standing Tandem Balance with Counter Support  - 2 x daily - 7 x weekly - 5 reps - 15 hold - Standing Single Leg Stance with Counter Support  - 2 x daily - 7 x weekly - 5 reps - 10 hold  ASSESSMENT:  CLINICAL IMPRESSION: Pt arrives today doing well, he is ambulating with his cane today. He reports the only time he feels "off" and not as confident in his walking is in the mornings. However, once he gets going he feels better.  We continued to work on strength and balance. Unsteady with marching using SPC, minA needed to prevent falling. He has difficulty with foot placement and decreased motor control with the RLE. Has 1 more PT visit scheduled and then would  like to save the remaining for the rest of the year.    Patient is a 63 y.o. M who was seen today for physical therapy evaluation and treatment for s/p CVA, difficulty walking and weakness.   RLE is weaker than the L.  Of more concern is general balance and coordination- didn't score well on Berg and is definitely a high fall risk. TUG and 5XSTS put him at risk for falls as well, with standing balance he tended to be back on his heels and did lose balance twice on the Berg requiring mod A to correct.  Will benefit from skilled PT services to address all findings and assist in return to optimal level of function.   OBJECTIVE IMPAIRMENTS: Abnormal gait, decreased activity tolerance, decreased balance, decreased knowledge of use of DME, decreased mobility, difficulty walking, and decreased strength.   ACTIVITY LIMITATIONS: sitting, standing, squatting, stairs, transfers, and locomotion level  PARTICIPATION LIMITATIONS: driving, shopping, community activity, occupation, and yard work  PERSONAL FACTORS: Age, Fitness, Past/current experiences, Social background, and Time since onset of injury/illness/exacerbation are also affecting patient's functional outcome.   REHAB POTENTIAL: Good  CLINICAL DECISION MAKING: Stable/uncomplicated  EVALUATION COMPLEXITY: Low   GOALS: Goals reviewed with patient? No  SHORT TERM GOALS: Target date: 03/24/24   Will be compliant with appropriate progressive HEP  Baseline: Goal status: progressing 03/14/24  2.  Will score at least 35 on Berg to show improved balance  Baseline:  Goal status: ongoing 03/27/24, MET 04/22/24   3.  Will be able to name 3 ways to prevent fall at home and in the community  Baseline:  Goal status: no carpets, no loose rugs, has a lights in room so can see at night MET 03/14/24    LONG TERM GOALS: Target date: 05/27/24    MMT to have improved by one grade in all weak groups Baseline:  Goal status: MET 04/22/24  2.  Will score at  least 42 on Berg to show reduced fall risk  Baseline: 24 Goal status: 34 ONGOING 04/22/24, 40 05/06/24 ONGOING  3.  Will be able to perform all daily dressing/self care activities on an independent basis without LOB  Baseline:  Goal status: "I can do it on my own just standing there" 04/22/24, MET 05/06/24  4.  Will be able to ambulate at least 528ft in with LRAD to show improved community access, fatigue no more than 3/10 Baseline:  Goal status: distance met with RW but fatigue at 5/10 ONGOING 04/22/24   5.  Decrease TUG time to 15 seconds Baseline:  Goal status: 23s 03/14/24, 14.41s MET 04/22/24     PLAN:  PT FREQUENCY: 1-2x/week  PT DURATION: 12 weeks  PLANNED INTERVENTIONS: 97164- PT Re-evaluation, 97110-Therapeutic exercises, 97530- Therapeutic activity, 97112- Neuromuscular re-education, 97535- Self Care, 78469- Manual therapy, [phone]- Gait training, [phone]- Aquatic Therapy, Patient/Family education, Balance training, Stair training, and Moist heat  PLAN FOR NEXT SESSION: heavy focus on balance, strengthening as able, general conditioning. Safety training/education as needed   PHYSICAL THERAPY DISCHARGE SUMMARY  Visits from Start of Care: 8    Patient agrees to discharge. Patient goals were partially met. Patient is being discharged due to being pleased with the current functional level. And limited number of therapy  visits   Donavon Fudge, [place], DPT 05/06/24 12:25 PM

## 2024-05-16 ENCOUNTER — Other Ambulatory Visit: Payer: Self-pay | Admitting: Family Medicine

## 2024-05-16 NOTE — Telephone Encounter (Unsigned)
 Copied from CRM [id number]. Topic: Clinical - Medication Refill >> May 16, 2024 10:14 AM [place] A wrote: Medication: magnesium  oxide (MAG-OX) 400 MG tablet  Has the patient contacted their pharmacy? Yes (Agent: If no, request that the patient contact the pharmacy for the refill. If patient does not wish to contact the pharmacy document the reason why and proceed with request.) (Agent: If yes, when and what did the pharmacy advise?)  This is the patient's preferred pharmacy:  [id number] - [place], [place] - [address] AT [address] [place] [address] Phone: [phone] Fax: [phone]   Is this the correct pharmacy for this prescription? Yes If no, delete pharmacy and type the correct one.   Has the prescription been filled recently? No  Is the patient out of the medication? Yes  Has the patient been seen for an appointment in the last year OR does the patient have an upcoming appointment? Yes  Can we respond through MyChart? Yes  Agent: Please be advised that Rx refills may take up to 3 business days. We ask that you follow-up with your pharmacy.

## 2024-05-19 MED ORDER — MAGNESIUM OXIDE 400 MG PO TABS: 200.0000 mg | ORAL_TABLET | Freq: Every day | ORAL | 0 refills | Status: AC

## 2024-05-29 ENCOUNTER — Ambulatory Visit: Admitting: Family Medicine

## 2024-06-10 ENCOUNTER — Ambulatory Visit (INDEPENDENT_AMBULATORY_CARE_PROVIDER_SITE_OTHER): Admitting: Podiatry

## 2024-06-10 DIAGNOSIS — M79674 Pain in right toe(s): Secondary | ICD-10-CM | POA: Diagnosis not present

## 2024-06-10 DIAGNOSIS — B351 Tinea unguium: Secondary | ICD-10-CM

## 2024-06-10 DIAGNOSIS — M79675 Pain in left toe(s): Secondary | ICD-10-CM

## 2024-06-10 NOTE — Progress Notes (Unsigned)
 Subjective:  Patient ID: Kenneth [name], male    DOB: Oct 05, 1961,  MRN: [id number]  Kenneth [name] presents to clinic today for:  Chief Complaint  Patient presents with   [facility]    RM#11 [facility] / [place] concerns.   Patient notes nails are thick, discolored, elongated and painful in shoegear when trying to ambulate.  He is interested in treating the nail fungus on his great toenails.  PCP is Rudd, Garnette HERO, MD.  Past Medical History:  Diagnosis Date   Alcohol dependence [facility])    Chronic calcific pancreatitis (HCC)    CVA (cerebral vascular accident) (HCC) 01/24/2022   Diabetes mellitus without complication (HCC)    Hyperlipidemia    Hypertension    Melanoma (HCC) 2013   metastatic - resected and cured with chemo/immuno tx   Pancreatic cyst-mass?    Polyneuropathy    Past Surgical History:  Procedure Laterality Date   BIOPSY  02/03/2022   Procedure: BIOPSY;  Surgeon: Avram Lupita BRAVO, MD;  Location: [facility] ENDOSCOPY;  Service: Gastroenterology;;   BRAIN SURGERY  2015   to check for possible malignancy from melanoma, result were scar tissue   COLONOSCOPY WITH PROPOFOL  N/A 02/03/2022   Procedure: COLONOSCOPY WITH PROPOFOL ;  Surgeon: Avram Lupita BRAVO, MD;  Location: [facility] ENDOSCOPY;  Service: Gastroenterology;  Laterality: N/A;   ESOPHAGOGASTRODUODENOSCOPY (EGD) WITH PROPOFOL  N/A 02/03/2022   Procedure: ESOPHAGOGASTRODUODENOSCOPY (EGD) WITH PROPOFOL ;  Surgeon: Avram Lupita BRAVO, MD;  Location: [facility] ENDOSCOPY;  Service: Gastroenterology;  Laterality: N/A;   melanoma exicison     right leg   Allergies  Allergen Reactions   Cat Dander Shortness Of Breath and Swelling    Swelling, watery eyes    Review of Systems: Negative except as noted in the HPI.  Objective:  Kenneth [name] is a pleasant 63 y.o. male in NAD. AAO x 3.  Vascular Examination: Capillary refill time is 3-5 seconds to toes bilateral. Palpable pedal pulses b/l LE. Digital hair present b/l.  Skin temperature gradient  WNL b/l. No varicosities b/l. No cyanosis noted b/l.   Dermatological Examination: Pedal skin with normal turgor, texture and tone b/l. No open wounds. No interdigital macerations b/l. Toenails x10 are 3mm thick, discolored, dystrophic with subungual debris. There is pain with compression of the nail plates.  They are elongated x10     Latest Ref Rng & Units 02/26/2024    9:08 AM 02/05/2024    4:47 AM 10/29/2023    2:09 PM 08/22/2023    1:57 PM  Hemoglobin A1C  Hemoglobin-A1c 4.6 - 6.5 % 8.0  8.8  9.0  7.3    Assessment/Plan: 1. Pain due to onychomycosis of toenails of both feet   2. Fungal nail infection     The mycotic toenails were sharply debrided x10 with sterile nail nippers and a power debriding burr to decrease bulk/thickness and length.    Clippings of the bilateral hallux nails were obtained and sent to [facility] labs for fungal nail culture.  Informed the patient it may take up to 3 weeks for the fungal culture results to come in.  Will review with the patient and discuss treatment options.  Return in about 3 months (around 09/10/2024) for fungal nail recheck / [facility].   [name], DPM, FACFAS [facility]     [address]  [place], [place] [address]                Office [phone]  Fax [phone]

## 2024-06-18 ENCOUNTER — Encounter: Payer: Self-pay | Admitting: Family Medicine

## 2024-06-19 ENCOUNTER — Ambulatory Visit: Payer: Self-pay | Admitting: Podiatry

## 2024-06-19 NOTE — Progress Notes (Signed)
 Culture report came back positive for moderate growth of dermatophytes and possibly saprophytic molds.  Nguyen there is a combination of the organisms involved, usually the best treatment is oral medication.  I can place the patient on oral terbinafine where he would take 1 tablet daily by mouth for the next 90 days.  He will need to get a blood test prior to starting the medication, which would be a hepatic panel, he does not need to fast for this.  I would need to get the order placed in the system that he could pick up and have performed at the facility of his choosing.  He needs to let me know whether it is good to be [facility], [facility], or  facility.  Once I get that information I can put the order in.  Please inform patient

## 2024-06-20 ENCOUNTER — Other Ambulatory Visit: Payer: Self-pay | Admitting: Podiatry

## 2024-06-20 DIAGNOSIS — B351 Tinea unguium: Secondary | ICD-10-CM

## 2024-06-20 NOTE — Progress Notes (Signed)
 Order placed by Dr. Loel. Once patient calls back the order just needs to be printed and picked up  by him.

## 2024-06-20 NOTE — Progress Notes (Signed)
 Patient informed of results and all recommendations. He is going to contact pharmacy for pricing on terbinafine before making a decision. He will either call us  back or send a message via mychart with the go ahead. He is okay with going to [facility] in [place] for the hepatic panel.

## 2024-06-20 NOTE — Progress Notes (Signed)
 Order written for the hepatic function panel for [facility] at the patient's request.  This is on file and can be printed for him to take to any [facility] facility in the area to have the blood test performed.  This does not need to be fasting.

## 2024-08-18 ENCOUNTER — Other Ambulatory Visit (HOSPITAL_COMMUNITY): Payer: Self-pay

## 2024-08-28 ENCOUNTER — Other Ambulatory Visit: Payer: Self-pay | Admitting: Family Medicine

## 2024-09-16 ENCOUNTER — Ambulatory Visit: Admitting: Podiatry

## 2024-09-16 DIAGNOSIS — E1151 Type 2 diabetes mellitus with diabetic peripheral angiopathy without gangrene: Secondary | ICD-10-CM | POA: Diagnosis not present

## 2024-09-16 DIAGNOSIS — L84 Corns and callosities: Secondary | ICD-10-CM

## 2024-09-16 DIAGNOSIS — M79675 Pain in left toe(s): Secondary | ICD-10-CM

## 2024-09-16 DIAGNOSIS — M79674 Pain in right toe(s): Secondary | ICD-10-CM | POA: Diagnosis not present

## 2024-09-16 DIAGNOSIS — B351 Tinea unguium: Secondary | ICD-10-CM

## 2024-09-16 MED ORDER — CICLOPIROX 8 % EX SOLN: Freq: Every day | CUTANEOUS | 11 refills | Status: AC

## 2024-09-16 NOTE — Progress Notes (Signed)
 Subjective:  Patient ID: Kenneth [name], male    DOB: 02-22-1961,  MRN: [id number]  Kenneth [name] presents to clinic today for:  Chief Complaint  Patient presents with   Diabetes    : 3 month fungal nail recheck / [facility].NIDDM A1C 8.0   Patient notes nails are thick and elongated, causing pain in shoe gear when ambulating.  He has painful calluses bilateral submet 5.  His last A1c was 8.  Patient had been given a prescription for liver function test because he was interested in treating the fungal toenails.  His fungal nail culture came back positive for dermatophytes and possible saprophytic molds.  Typically oral medication works best against both dermatophytes and saprophytic molds.  This was reviewed with the patient.  He states that he did not get the liver function test performed.  He thinks he may want to only do topical at this time due to the risks involved with oral medication.  PCP is Thedora Garnette HERO, MD. last seen 01/07/2024  Past Medical History:  Diagnosis Date   Alcohol dependence (HCC)    Chronic calcific pancreatitis (HCC)    CVA (cerebral vascular accident) (HCC) 01/24/2022   Diabetes mellitus without complication (HCC)    Hyperlipidemia    Hypertension    Melanoma (HCC) 2013   metastatic - resected and cured with chemo/immuno tx   Pancreatic cyst-mass?    Polyneuropathy    Allergies  Allergen Reactions   Cat Dander Shortness Of Breath and Swelling    Swelling, watery eyes    Objective:  Kenneth [name] is a pleasant 64 y.o. male in NAD. AAO x 3.  Vascular Examination: Patient has palpable DP pulse, absent PT pulse bilateral.  Delayed capillary refill bilateral toes.  Sparse digital hair bilateral.  Proximal to distal cooling WNL bilateral.    Dermatological Examination: Interspaces are clear with no open lesions noted bilateral.  Skin is shiny and atrophic bilateral.  Nails are 3-68mm thick, with yellowish/brown discoloration, subungual debris and distal  onycholysis x10.  There is pain with compression of nails x10.  There are hyperkeratotic lesions noted bilateral submet 5.     Latest Ref Rng & Units 02/26/2024    9:08 AM 02/05/2024    4:47 AM 10/29/2023    2:09 PM  Hemoglobin A1C  Hemoglobin-A1c 4.6 - 6.5 % 8.0  8.8  9.0    Patient qualifies for at-risk foot care because of diabetes and PVD.  Assessment/Plan: 1. Pain due to onychomycosis of toenails of both feet   2. Callus of foot   3. Type II diabetes mellitus with peripheral circulatory disorder (HCC)     Meds ordered this encounter  Medications   ciclopirox (PENLAC) 8 % solution    Sig: Apply topically at bedtime. Apply thin layer over nail. Apply daily over previous coat. Remove weekly with polish remover.    Dispense:  6.6 mL    Refill:  11   Mycotic nails x10 were sharply debrided with sterile nail nippers and power debriding burr to decrease bulk and length.  Hyperkeratotic lesions bilateral submet 5 were shaved with #312 blade.  Discussed the fungal nails with the patient today.  Since he had not gotten the liver function test performed, it is not safe to proceed with oral medication.  He states he would like to try the topical instead anyway.  Sent in prescription for ciclopirox 8% solution to his pharmacy to begin applying once daily to the toenails.  At the end of each week, he will remove the lacquer buildup with the use of rubbing alcohol or nail polish remover.  Return in about 3 months (around 12/17/2024) for [facility] & fungal nail recheck.   [name], DPM, FACFAS [facility]     [address] [place], [place] [address]                Office [phone]  Fax [phone]

## 2024-09-29 ENCOUNTER — Encounter: Payer: Self-pay | Admitting: Neurology

## 2024-09-29 ENCOUNTER — Ambulatory Visit: Admitting: Neurology

## 2024-10-28 ENCOUNTER — Other Ambulatory Visit: Payer: Self-pay | Admitting: Family

## 2024-10-28 ENCOUNTER — Other Ambulatory Visit: Payer: Self-pay | Admitting: Family Medicine

## 2024-10-28 DIAGNOSIS — H8110 Benign paroxysmal vertigo, unspecified ear: Secondary | ICD-10-CM

## 2024-10-28 DIAGNOSIS — I10 Essential (primary) hypertension: Secondary | ICD-10-CM

## 2024-10-29 ENCOUNTER — Other Ambulatory Visit: Payer: Self-pay | Admitting: Family Medicine

## 2024-10-29 DIAGNOSIS — I10 Essential (primary) hypertension: Secondary | ICD-10-CM

## 2024-10-29 DIAGNOSIS — H8110 Benign paroxysmal vertigo, unspecified ear: Secondary | ICD-10-CM

## 2024-11-07 ENCOUNTER — Other Ambulatory Visit: Payer: Self-pay | Admitting: Family Medicine

## 2024-11-10 NOTE — Telephone Encounter (Signed)
 Called and spoke to patient, he advised to call his sister to schedule the appointment.  Left a VM to rtn call to schedule a follow up appointment. Dm/cma

## 2024-12-16 ENCOUNTER — Ambulatory Visit: Admitting: Podiatry

## 2024-12-16 ENCOUNTER — Ambulatory Visit: Admitting: Family Medicine

## 2024-12-16 ENCOUNTER — Encounter: Payer: Self-pay | Admitting: Family Medicine

## 2024-12-16 VITALS — BP 130/66 | HR 64 | Temp 97.7°F | Ht 65.0 in | Wt 149.4 lb

## 2024-12-16 DIAGNOSIS — E119 Type 2 diabetes mellitus without complications: Secondary | ICD-10-CM

## 2024-12-16 DIAGNOSIS — Z7984 Long term (current) use of oral hypoglycemic drugs: Secondary | ICD-10-CM | POA: Diagnosis not present

## 2024-12-16 DIAGNOSIS — R972 Elevated prostate specific antigen [PSA]: Secondary | ICD-10-CM

## 2024-12-16 DIAGNOSIS — N3941 Urge incontinence: Secondary | ICD-10-CM

## 2024-12-16 DIAGNOSIS — H8110 Benign paroxysmal vertigo, unspecified ear: Secondary | ICD-10-CM | POA: Diagnosis not present

## 2024-12-16 DIAGNOSIS — Z8673 Personal history of transient ischemic attack (TIA), and cerebral infarction without residual deficits: Secondary | ICD-10-CM | POA: Diagnosis not present

## 2024-12-16 DIAGNOSIS — F1021 Alcohol dependence, in remission: Secondary | ICD-10-CM

## 2024-12-16 DIAGNOSIS — E785 Hyperlipidemia, unspecified: Secondary | ICD-10-CM

## 2024-12-16 DIAGNOSIS — R627 Adult failure to thrive: Secondary | ICD-10-CM

## 2024-12-16 DIAGNOSIS — Z23 Encounter for immunization: Secondary | ICD-10-CM

## 2024-12-16 DIAGNOSIS — I10 Essential (primary) hypertension: Secondary | ICD-10-CM

## 2024-12-16 DIAGNOSIS — N182 Chronic kidney disease, stage 2 (mild): Secondary | ICD-10-CM | POA: Diagnosis not present

## 2024-12-16 MED ORDER — SITAGLIPTIN PHOSPHATE 100 MG PO TABS: 100.0000 mg | ORAL_TABLET | Freq: Every day | ORAL | 3 refills | Status: AC

## 2024-12-16 MED ORDER — TAMSULOSIN HCL 0.4 MG PO CAPS: 0.4000 mg | ORAL_CAPSULE | Freq: Every day | ORAL | 3 refills | Status: AC

## 2024-12-16 MED ORDER — MIRTAZAPINE 7.5 MG PO TABS: 7.5000 mg | ORAL_TABLET | Freq: Every day | ORAL | 3 refills | Status: AC

## 2024-12-16 MED ORDER — MAGNESIUM OXIDE -MG SUPPLEMENT 400 (240 MG) MG PO TABS: 400.0000 mg | ORAL_TABLET | Freq: Every day | ORAL | 5 refills | Status: AC

## 2024-12-16 MED ORDER — AMLODIPINE BESY-BENAZEPRIL HCL 10-20 MG PO CAPS: 1.0000 | ORAL_CAPSULE | Freq: Every day | ORAL | 3 refills | Status: AC

## 2024-12-16 MED ORDER — ATORVASTATIN CALCIUM 20 MG PO TABS: 20.0000 mg | ORAL_TABLET | Freq: Every day | ORAL | 3 refills | Status: AC

## 2024-12-16 NOTE — Assessment & Plan Note (Signed)
Stable. Continue tamsulosin 0.4 mg daily.

## 2024-12-16 NOTE — Assessment & Plan Note (Signed)
 Remains abstinent

## 2024-12-16 NOTE — Assessment & Plan Note (Signed)
 I will reassess a PSA today.

## 2024-12-16 NOTE — Assessment & Plan Note (Signed)
 Kenneth [name] has not had his diabetes checked in 10 months. I will check annual DM labs today. I will have him continue sitagliptin  (Januvia ) 100 mg daily and glipizide  ER 5 mg daily. If his A1c remains high, would consider an SGLT2i in light of proteinuria.

## 2024-12-16 NOTE — Assessment & Plan Note (Signed)
 Blood pressure is at goal. Continue amlodipine -benazepril  10-20 mg daily. I will check annual renal labs.

## 2024-12-16 NOTE — Assessment & Plan Note (Signed)
 Continue focus on blood pressure and glucose control, adequate hydration, and avoidance of nephrotoxic medications. Would consider addition of an SGLT2i

## 2024-12-16 NOTE — Progress Notes (Signed)
 " [facility] [address] [place] [address] Dept: [phone] Dept Fax: [phone]  Chronic Care Office Visit  Subjective:    Patient ID: Kenneth [name], male    DOB: Nov 29, 1961, 64 y.o..   MRN: [id number]  Chief Complaint  Patient presents with   Hypertension    F/u HTN.   No concerns.  Declines flu shot.    History of Present Illness:  Patient is in today for reassessment of chronic medical conditions.   Kenneth [name] has a history of Type 2 diabetes. He is managed on sitagliptin  (Januvia ) 100 mg daily and glipizide  ER 5 mg daily.    Kenneth [name] has a history of hypertension. He is managed on amlodipine -benazepril  10-20 mg daily and metoprolol  tartrate 100 mg bid.   Kenneth [name] has dyslipidemia. He is managed on atorvastatin  20 mg daily.  Kenneth [name] has had past issues with unintentional weight loss/failure to thrive. He was started on mirtazapine  7.5 mg at bedtime, and had weight regain and stabilization.  Kenneth [name] was admitted at [facility] on 02/04/2024 with slurred speech, right facial droop, and right arm tingling due to an acute left CVA. On 3/7, he was moved to rehab where he stayed until 3/15. He underwent outpatient PT over the past year and has noted improvement in much of his residual weakness. Although his strength is better, he still uses a cane to assist with ambulation. He has to think about his speech to reduce slurring words. he still has some issues with clearing oral secretions.   Kenneth [name] has a history of urge incontinence. He is managed on tamsulosin  0.4 mg daily.  Past Medical History: Patient Active Problem List   Diagnosis Date Noted   Onychomycosis 02/26/2024   CKD stage G2/A2, GFR 60-89 and albumin creatinine ratio 30-299 mg/g 02/13/2024   Acute focal neurological deficit 02/04/2024   Peripheral neuropathy 08/22/2023   Osteoarthritis of left knee 08/22/2023   BPV (benign  positional vertigo) 02/06/2023   Seborrhea 11/07/2022   History of melanoma 04/12/2022   Alcohol dependence in remission (HCC) 04/12/2022   Aortic atherosclerosis 04/12/2022   Alcohol-induced chronic pancreatitis (HCC) 03/15/2022   History of CVA (cerebrovascular accident) 02/28/2022   Gastritis and gastroduodenitis    DNR (do not resuscitate) 02/01/2022   Vertigo 01/24/2022   Elevated PSA 11/24/2021   History of immunotherapy 11/25/2019   Melanoma metastatic to brain (HCC) 08/08/2016   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HCC) 08/13/2014   Metastatic melanoma to lung (HCC) 12/12/2012   Type 2 diabetes mellitus (HCC) 08/20/2007   Dyslipidemia 08/20/2007   Essential hypertension 08/19/2007   Past Surgical History:  Procedure Laterality Date   BIOPSY  02/03/2022   Procedure: BIOPSY;  Surgeon: Avram Lupita BRAVO, MD;  Location: [facility] ENDOSCOPY;  Service: Gastroenterology;;   BRAIN SURGERY  2015   to check for possible malignancy from melanoma, result were scar tissue   COLONOSCOPY WITH PROPOFOL  N/A 02/03/2022   Procedure: COLONOSCOPY WITH PROPOFOL ;  Surgeon: Avram Lupita BRAVO, MD;  Location: [facility] ENDOSCOPY;  Service: Gastroenterology;  Laterality: N/A;   ESOPHAGOGASTRODUODENOSCOPY (EGD) WITH PROPOFOL  N/A 02/03/2022   Procedure: ESOPHAGOGASTRODUODENOSCOPY (EGD) WITH PROPOFOL ;  Surgeon: Avram Lupita BRAVO, MD;  Location: [facility] ENDOSCOPY;  Service: Gastroenterology;  Laterality: N/A;   melanoma exicison     right leg   Family History  Problem Relation Age of Onset   Heart disease Father    Prostate cancer Father    Cancer Maternal Grandmother  Brain   Stomach cancer Paternal Grandmother    Colon cancer Neg Hx    Esophageal cancer Neg Hx    Rectal cancer Neg Hx    Outpatient Medications Prior to Visit  Medication Sig Dispense Refill   amLODipine -benazepril  (LOTREL) 10-20 MG capsule Take 1 capsule by mouth daily. Need appointment for further refills 30 capsule 0   aspirin  EC 81 MG tablet Take 1 tablet (81  mg total) by mouth daily. Swallow whole. 100 tablet 0   atorvastatin  (LIPITOR) 20 MG tablet TAKE 1 TABLET(20 MG) BY MOUTH DAILY 30 tablet 0   B Complex-C (B-COMPLEX WITH VITAMIN C) tablet Take 1 tablet by mouth daily. 30 tablet 0   Blood Glucose Monitoring Suppl DEVI 1 each by Does not apply route in the morning, at noon, and at bedtime. May substitute to any manufacturer covered by patient's insurance. 1 each 0   ciclopirox  (PENLAC ) 8 % solution Apply topically at bedtime. Apply thin layer over nail. Apply daily over previous coat. Remove weekly with polish remover. 6.6 mL 11   clopidogrel  (PLAVIX ) 75 MG tablet Take 1 tablet (75 mg total) by mouth daily. 30 tablet 1   diclofenac  Sodium (VOLTAREN ) 1 % GEL Apply 2 g topically 4 (four) times daily. 100 g 0   Glucose Blood (BLOOD GLUCOSE TEST STRIPS) STRP Use to test blood sugar three times daily as directed. 100 strip 0   ketoconazole  (NIZORAL ) 2 % cream Apply topically 2 (two) times daily. 15 g 0   lactobacillus acidophilus (BACID) TABS tablet Take 1 tablet by mouth daily. Align     magnesium  oxide (MAG-OX) 400 (240 Mg) MG tablet TAKE 1/2 TABLET BY MOUTH EVERY NIGHT AT BEDTIME 30 tablet 5   magnesium  oxide (MAG-OX) 400 MG tablet Take 0.5 tablets (200 mg total) by mouth at bedtime. 30 tablet 0   mirtazapine  (REMERON ) 7.5 MG tablet TAKE 1 TABLET BY MOUTH DAILY AT BEDTIME 90 tablet 3   Multiple Vitamin (MULTIVITAMIN WITH MINERALS) TABS tablet Take 1 tablet by mouth daily.     sitaGLIPtin  (JANUVIA ) 100 MG tablet Take 1 tablet (100 mg total) by mouth daily. (Patient taking differently: Take 100 mg by mouth every evening.) 90 tablet 3   tamsulosin  (FLOMAX ) 0.4 MG CAPS capsule Take 1 capsule (0.4 mg total) by mouth daily. 30 capsule 0   triamcinolone  cream (KENALOG ) 0.5 % Apply topically 2 (two) times daily as needed (facial rash). 30 g 0   vitamin D3 (CHOLECALCIFEROL ) 25 MCG tablet Take 3 tablets (3,000 Units total) by mouth daily. 90 tablet 0   No  facility-administered medications prior to visit.   Allergies[1]   Objective:   Today's Vitals   12/16/24 1423  BP: 130/66  Pulse: 64  Temp: 97.7 F (36.5 C)  TempSrc: Temporal  SpO2: 98%  Weight: 149 lb 6.4 oz (67.8 kg)  Height: 5' 5 (1.651 m)   Body mass index is 24.86 kg/m.   General: Well developed, well nourished. No acute distress. Neuro: Grip strength and arm strength on right good. Some loss of dexterity. Speech is mildly slurred. Psych: Alert and oriented. Normal mood and affect.  Health Maintenance Due  Topic Date Due   OPHTHALMOLOGY EXAM  Never done   Diabetic kidney evaluation - Urine ACR  Never done   Pneumococcal Vaccine: 50+ Years (1 of 2 - PCV) Never done   Zoster Vaccines- Shingrix (1 of 2) Never done   COVID-19 Vaccine (2 - Janssen risk series) 04/10/2020  DTaP/Tdap/Td (2 - Td or Tdap) 10/17/2023   Influenza Vaccine  Never done   HEMOGLOBIN A1C  08/28/2024     Assessment & Plan:   Problem List Items Addressed This Visit       Cardiovascular and Mediastinum   Essential hypertension   Blood pressure is at goal. Continue amlodipine -benazepril  10-20 mg daily. I will check annual renal labs.      Relevant Medications   metoprolol  tartrate (LOPRESSOR ) 100 MG tablet   amLODipine -benazepril  (LOTREL) 10-20 MG capsule   atorvastatin  (LIPITOR) 20 MG tablet     Endocrine   Type 2 diabetes mellitus (HCC) - Primary   Kenneth [name] has not had his diabetes checked in 10 months. I will check annual DM labs today. I will have him continue sitagliptin  (Januvia ) 100 mg daily and glipizide  ER 5 mg daily. If his A1c remains high, would consider an SGLT2i in light of proteinuria.      Relevant Medications   glipiZIDE  (GLUCOTROL  XL) 5 MG 24 hr tablet   amLODipine -benazepril  (LOTREL) 10-20 MG capsule   atorvastatin  (LIPITOR) 20 MG tablet   sitaGLIPtin  (JANUVIA ) 100 MG tablet   Other Relevant Orders   Microalbumin / creatinine urine ratio   Basic metabolic  panel with GFR   Hemoglobin A1c   Urinalysis, Routine w reflex microscopic     Genitourinary   CKD stage G2/A2, GFR 60-89 and albumin creatinine ratio 30-299 mg/g   Continue focus on blood pressure and glucose control, adequate hydration, and avoidance of nephrotoxic medications. Would consider addition of an SGLT2i        Other   Alcohol dependence in remission (HCC)   Remains abstinent.      Dyslipidemia   I will check lipids today. Continue atorvastatin  20 mg daily.      Relevant Medications   atorvastatin  (LIPITOR) 20 MG tablet   Other Relevant Orders   Lipid panel   Elevated PSA   I will reassess a PSA today.      Relevant Orders   PSA, Medicare   History of CVA (cerebrovascular accident)   Residual issues with loss of manual dexterity, unsteady gait, slurred speech, and difficulty managing secretions normally. Despite this, he has been able to get out and do some enjoyable activities, such as putting. He asked about driving. I am concerned that he lacks coordination and dexterity on the right, which is his dominant side.       Urge incontinence   Stable. Continue tamsulosin  0.4 mg daily.      Relevant Medications   tamsulosin  (FLOMAX ) 0.4 MG CAPS capsule   Other Visit Diagnoses       Failure to thrive in adult       Relevant Medications   mirtazapine  (REMERON ) 7.5 MG tablet     Need for pneumococcal 20-valent conjugate vaccination       Relevant Orders   Pneumococcal conjugate vaccine 20-valent (Prevnar 20) (Completed)       Return in about 6 months (around 06/15/2025) for Reassessment.   Garnette CHRISTELLA Simpler, MD  I,Emily Lagle,acting as a scribe for Garnette CHRISTELLA Simpler, MD.,have documented all relevant documentation on the behalf of Garnette CHRISTELLA Simpler, MD.  I, Garnette CHRISTELLA Simpler, MD, have reviewed all documentation for this visit. The documentation on 12/16/2024 for the exam, diagnosis, procedures, and orders are all accurate and complete.     [1]   Allergies Allergen Reactions   Cat Dander Shortness Of Breath and Swelling    Swelling, watery  eyes   "

## 2024-12-16 NOTE — Assessment & Plan Note (Signed)
 I will check lipids today. Continue atorvastatin 20 mg daily.

## 2024-12-16 NOTE — Assessment & Plan Note (Signed)
 Residual issues with loss of manual dexterity, unsteady gait, slurred speech, and difficulty managing secretions normally. Despite this, he has been able to get out and do some enjoyable activities, such as putting. He asked about driving. I am concerned that he lacks coordination and dexterity on the right, which is his dominant side.

## 2024-12-17 ENCOUNTER — Ambulatory Visit: Payer: Self-pay | Admitting: Family Medicine

## 2024-12-17 LAB — BASIC METABOLIC PANEL WITH GFR
BUN: 13 mg/dL (ref 6–23)
CO2: 24 meq/L (ref 19–32)
Calcium: 9.1 mg/dL (ref 8.4–10.5)
Chloride: 104 meq/L (ref 96–112)
Creatinine, Ser: 1.27 mg/dL (ref 0.40–1.50)
GFR: 60.02 mL/min
Glucose, Bld: 110 mg/dL — ABNORMAL HIGH (ref 70–99)
Potassium: 3.3 meq/L — ABNORMAL LOW (ref 3.5–5.1)
Sodium: 140 meq/L (ref 135–145)

## 2024-12-17 LAB — LIPID PANEL
Cholesterol: 101 mg/dL (ref 28–200)
HDL: 47.7 mg/dL
LDL Cholesterol: 39 mg/dL (ref 10–99)
NonHDL: 53.24
Total CHOL/HDL Ratio: 2
Triglycerides: 70 mg/dL (ref 10.0–149.0)
VLDL: 14 mg/dL (ref 0.0–40.0)

## 2024-12-17 LAB — MICROALBUMIN / CREATININE URINE RATIO
Creatinine,U: 134.4 mg/dL
Microalb Creat Ratio: 75.7 mg/g — ABNORMAL HIGH (ref 0.0–30.0)
Microalb, Ur: 10.2 mg/dL — ABNORMAL HIGH (ref 0.7–1.9)

## 2024-12-17 LAB — PSA, MEDICARE: PSA: 4.13 ng/mL — ABNORMAL HIGH (ref 0.10–4.00)

## 2024-12-17 LAB — HEMOGLOBIN A1C: Hgb A1c MFr Bld: 7.6 % — ABNORMAL HIGH (ref 4.6–6.5)

## 2024-12-24 ENCOUNTER — Ambulatory Visit: Admitting: Podiatry

## 2024-12-24 ENCOUNTER — Encounter: Payer: Self-pay | Admitting: Podiatry

## 2024-12-24 DIAGNOSIS — B351 Tinea unguium: Secondary | ICD-10-CM | POA: Diagnosis not present

## 2024-12-24 DIAGNOSIS — M79674 Pain in right toe(s): Secondary | ICD-10-CM | POA: Diagnosis not present

## 2024-12-24 DIAGNOSIS — M79675 Pain in left toe(s): Secondary | ICD-10-CM

## 2024-12-24 NOTE — Progress Notes (Signed)
 Patient presents for evaluation and treatment of tenderness and some redness around nails feet.  Tenderness around toes with walking and wearing shoes.  Physical exam:  General appearance: Alert, pleasant, and in no acute distress.  Vascular: Pedal pulses: DP 2/4 B/L, PT 0/4 B/L.  Moderate edema lower legs bilaterally.  Capillary refill time immediate bilaterally  Neurologic:  Dermatologic:  Nails thickened, disfigured, discolored 1-5 BL with subungual debris.  Redness and hypertrophic nail folds along nail folds bilaterally but no signs of drainage or infection.  Musculoskeletal:     Diagnosis: 1. Painful onychomycotic nails 1 through 5 bilaterally. 2. Pain toes 1 through 5 bilaterally.  Plan: -Debrided onychomycotic nails 1 through 5 bilaterally.  Sharply debrided nails with nail clipper and reduced with a power bur.  Return 3 months [facility]

## 2025-03-24 ENCOUNTER — Ambulatory Visit: Admitting: Podiatry

## 2025-06-15 ENCOUNTER — Ambulatory Visit: Admitting: Family Medicine
